# Patient Record
Sex: Male | Born: 1950 | Race: Black or African American | Hispanic: No | Marital: Married | Smoking: Never smoker
Health system: Southern US, Community
[De-identification: ages and names within clinical notes are randomized; demographics above are authoritative.]

## PROBLEM LIST (undated history)

## (undated) DIAGNOSIS — K5790 Diverticulosis of intestine, part unspecified, without perforation or abscess without bleeding: Secondary | ICD-10-CM

## (undated) DIAGNOSIS — T7840XA Allergy, unspecified, initial encounter: Secondary | ICD-10-CM

## (undated) DIAGNOSIS — I639 Cerebral infarction, unspecified: Secondary | ICD-10-CM

## (undated) DIAGNOSIS — F0390 Unspecified dementia without behavioral disturbance: Secondary | ICD-10-CM

## (undated) DIAGNOSIS — I10 Essential (primary) hypertension: Secondary | ICD-10-CM

## (undated) DIAGNOSIS — K635 Polyp of colon: Secondary | ICD-10-CM

## (undated) DIAGNOSIS — R569 Unspecified convulsions: Secondary | ICD-10-CM

## (undated) HISTORY — DX: Essential (primary) hypertension: I10

## (undated) HISTORY — DX: Unspecified convulsions: R56.9

## (undated) HISTORY — DX: Cerebral infarction, unspecified: I63.9

## (undated) HISTORY — DX: Polyp of colon: K63.5

## (undated) HISTORY — DX: Diverticulosis of intestine, part unspecified, without perforation or abscess without bleeding: K57.90

## (undated) HISTORY — DX: Allergy, unspecified, initial encounter: T78.40XA

---

## 2002-01-27 ENCOUNTER — Encounter: Payer: Self-pay | Admitting: Emergency Medicine

## 2002-01-27 ENCOUNTER — Emergency Department (HOSPITAL_COMMUNITY): Admission: EM | Admit: 2002-01-27 | Discharge: 2002-01-28 | Payer: Self-pay | Admitting: Emergency Medicine

## 2006-02-06 ENCOUNTER — Ambulatory Visit: Payer: Self-pay | Admitting: Family Medicine

## 2006-03-07 ENCOUNTER — Ambulatory Visit: Payer: Self-pay | Admitting: Family Medicine

## 2006-03-26 ENCOUNTER — Ambulatory Visit: Payer: Self-pay | Admitting: Family Medicine

## 2006-04-16 ENCOUNTER — Ambulatory Visit: Payer: Self-pay | Admitting: Gastroenterology

## 2006-04-26 ENCOUNTER — Ambulatory Visit: Payer: Self-pay | Admitting: Family Medicine

## 2006-04-29 ENCOUNTER — Ambulatory Visit: Payer: Self-pay | Admitting: Gastroenterology

## 2006-04-29 LAB — HM COLONOSCOPY

## 2006-05-29 ENCOUNTER — Ambulatory Visit: Payer: Self-pay | Admitting: Family Medicine

## 2011-05-15 ENCOUNTER — Ambulatory Visit (INDEPENDENT_AMBULATORY_CARE_PROVIDER_SITE_OTHER): Payer: BC Managed Care – PPO | Admitting: Family Medicine

## 2011-05-15 ENCOUNTER — Encounter: Payer: Self-pay | Admitting: Family Medicine

## 2011-05-15 VITALS — BP 170/120 | HR 76 | Wt 200.0 lb

## 2011-05-15 DIAGNOSIS — I10 Essential (primary) hypertension: Secondary | ICD-10-CM

## 2011-05-15 LAB — COMPREHENSIVE METABOLIC PANEL
ALT: 13 U/L (ref 0–53)
AST: 18 U/L (ref 0–37)
Albumin: 4.6 g/dL (ref 3.5–5.2)
Alkaline Phosphatase: 62 U/L (ref 39–117)
BUN: 12 mg/dL (ref 6–23)
CO2: 26 mEq/L (ref 19–32)
Calcium: 9.8 mg/dL (ref 8.4–10.5)
Chloride: 104 mEq/L (ref 96–112)
Creat: 1.37 mg/dL — ABNORMAL HIGH (ref 0.50–1.35)
Glucose, Bld: 87 mg/dL (ref 70–99)
Potassium: 4.1 mEq/L (ref 3.5–5.3)
Sodium: 139 mEq/L (ref 135–145)
Total Bilirubin: 0.8 mg/dL (ref 0.3–1.2)
Total Protein: 7.6 g/dL (ref 6.0–8.3)

## 2011-05-15 LAB — CBC WITH DIFFERENTIAL/PLATELET
Basophils Absolute: 0 10*3/uL (ref 0.0–0.1)
Basophils Relative: 0 % (ref 0–1)
Eosinophils Absolute: 0.2 10*3/uL (ref 0.0–0.7)
Eosinophils Relative: 3 % (ref 0–5)
HCT: 48.4 % (ref 39.0–52.0)
Hemoglobin: 15.7 g/dL (ref 13.0–17.0)
Lymphocytes Relative: 19 % (ref 12–46)
Lymphs Abs: 1 10*3/uL (ref 0.7–4.0)
MCH: 27.5 pg (ref 26.0–34.0)
MCHC: 32.4 g/dL (ref 30.0–36.0)
MCV: 84.9 fL (ref 78.0–100.0)
Monocytes Absolute: 0.6 10*3/uL (ref 0.1–1.0)
Monocytes Relative: 12 % (ref 3–12)
Neutro Abs: 3.5 10*3/uL (ref 1.7–7.7)
Neutrophils Relative %: 66 % (ref 43–77)
Platelets: 258 10*3/uL (ref 150–400)
RBC: 5.7 MIL/uL (ref 4.22–5.81)
RDW: 14.6 % (ref 11.5–15.5)
WBC: 5.3 10*3/uL (ref 4.0–10.5)

## 2011-05-15 LAB — LIPID PANEL
Cholesterol: 174 mg/dL (ref 0–200)
HDL: 48 mg/dL (ref >39)
LDL Cholesterol: 112 mg/dL — ABNORMAL HIGH (ref 0–99)
Total CHOL/HDL Ratio: 3.6 Ratio
Triglycerides: 71 mg/dL (ref <150)
VLDL: 14 mg/dL (ref 0–40)

## 2011-05-15 MED ORDER — LISINOPRIL-HYDROCHLOROTHIAZIDE 20-12.5 MG PO TABS: 1.0000 | ORAL_TABLET | Freq: Every day | ORAL | Status: DC

## 2011-05-15 MED ORDER — AMLODIPINE BESYLATE 5 MG PO TABS: 5.0000 mg | ORAL_TABLET | Freq: Every day | ORAL | Status: DC

## 2011-05-15 NOTE — Progress Notes (Signed)
  Subjective:    Patient ID: Craig [name], male    DOB: [birth year], 60 y.o.   MRN: [id number]  HPI He is here after a several year absence for evaluation of her blood pressure. He was last seen in August of 2007 and his blood pressure at that time was normal. Recently he has noted blood pressure readings in the 120 diastolic area. He did have one episode where his son woke him up from a deep sleep and he was apparently having some slight slurred speech however this cleared up very quickly when he woke up. He has had no  blurred vision, chest pain, shortness of breath, DOE. He does have headaches however the headaches only occur when he has a fight with his wife   Review of Systems Negative except as above    Objective:   Physical Exam alert and in no distress. Tympanic membranes and canals are normal. Throat is clear. Tonsils are normal. Neck is supple without adenopathy or thyromegaly. Cardiac exam shows a regular sinus rhythm without murmurs or gallops. Lungs are clear to auscultation. Funduscopic exam shows slight arterial narrowing but flat discs. DTRs are normal. EKG does show a strain pattern. Chest x-ray shows no acute changes.       Assessment & Plan:   1. Hypertension, accelerated  CBC with Differential, Lipid panel, Comp Met (CMET), PR ELECTROCARDIOGRAM, COMPLETE, DG Chest 2 View, POCT urinalysis dipstick   I will place him on lisinopril/HCTZ. Also amlodipine. Discussed possible side effects including cough and swelling. He is to return here in one week for recheck. Over 45 minutes spent discussing all these issues with him.

## 2011-05-16 ENCOUNTER — Telehealth: Payer: Self-pay

## 2011-05-16 NOTE — Telephone Encounter (Signed)
Informed pt of labs look good

## 2011-05-17 ENCOUNTER — Encounter: Payer: Self-pay | Admitting: Gastroenterology

## 2011-05-22 ENCOUNTER — Encounter: Payer: Self-pay | Admitting: Family Medicine

## 2011-05-22 ENCOUNTER — Ambulatory Visit (INDEPENDENT_AMBULATORY_CARE_PROVIDER_SITE_OTHER): Payer: BC Managed Care – PPO | Admitting: Family Medicine

## 2011-05-22 VITALS — BP 150/90 | HR 86 | Wt 198.0 lb

## 2011-05-22 DIAGNOSIS — I10 Essential (primary) hypertension: Secondary | ICD-10-CM

## 2011-05-22 DIAGNOSIS — Z23 Encounter for immunization: Secondary | ICD-10-CM

## 2011-05-22 NOTE — Progress Notes (Signed)
  Subjective:    Patient ID: Craig [name], male    DOB: [birth year], 60 y.o.   MRN: [id number]  HPI He is here for recheck on his blood pressure. He did have some initial difficulty with dizziness for several days she is on the medicine but none since then.   Review of Systems     Objective:   Physical Exam Alert and in no distress otherwise not examined. Blood pressure is recorded       Assessment & Plan:   1. Hypertension    continue on present medication regimen. Flu shot will also be given. Recheck here in one month.

## 2011-06-19 ENCOUNTER — Encounter: Payer: Self-pay | Admitting: Family Medicine

## 2011-06-21 ENCOUNTER — Ambulatory Visit (INDEPENDENT_AMBULATORY_CARE_PROVIDER_SITE_OTHER): Payer: BC Managed Care – PPO | Admitting: Family Medicine

## 2011-06-21 ENCOUNTER — Encounter: Payer: Self-pay | Admitting: Family Medicine

## 2011-06-21 VITALS — BP 150/100 | HR 78 | Ht 69.0 in | Wt 199.0 lb

## 2011-06-21 DIAGNOSIS — I10 Essential (primary) hypertension: Secondary | ICD-10-CM

## 2011-06-21 MED ORDER — OLMESARTAN-AMLODIPINE-HCTZ 40-10-12.5 MG PO TABS: 1.0000 | ORAL_TABLET | ORAL | Status: DC

## 2011-06-21 NOTE — Patient Instructions (Signed)
Stop to 2 pills of your presently on and switch to the new pill which will have 3 drugs in it. Recheck here in one month. If you problems getting that filled call me

## 2011-06-21 NOTE — Progress Notes (Signed)
  Subjective:    Patient ID: Craig [name], male    DOB: [birth year], 60 y.o.   MRN: [id number]  HPI He is here for recheck on his blood pressure. He has been taking his medications regularly and has no particular concerns or complaints.   Review of Systems     Objective:   Physical Exam Alert and in no distress otherwise not examined      Assessment & Plan:  Hypertension. I will try to switch him to Tribenzor. He will call and let me know if his insurance will allow this. Otherwise I will see him back here in one month.

## 2011-06-29 ENCOUNTER — Telehealth: Payer: Self-pay | Admitting: Family Medicine

## 2011-06-29 NOTE — Telephone Encounter (Signed)
PT INFORMED

## 2011-07-18 ENCOUNTER — Encounter: Payer: Self-pay | Admitting: Family Medicine

## 2011-07-18 ENCOUNTER — Ambulatory Visit (INDEPENDENT_AMBULATORY_CARE_PROVIDER_SITE_OTHER): Payer: BC Managed Care – PPO | Admitting: Family Medicine

## 2011-07-18 VITALS — BP 130/90 | HR 76 | Wt 199.0 lb

## 2011-07-18 DIAGNOSIS — R200 Anesthesia of skin: Secondary | ICD-10-CM

## 2011-07-18 DIAGNOSIS — I10 Essential (primary) hypertension: Secondary | ICD-10-CM

## 2011-07-18 DIAGNOSIS — R209 Unspecified disturbances of skin sensation: Secondary | ICD-10-CM

## 2011-07-18 NOTE — Progress Notes (Signed)
  Subjective:    Patient ID: Craig [name], male    DOB: 01/14/1951, 60 y.o.   MRN: [id number]  HPI Monday night he noticed a tingling sensation in his right hand that has continued until today. It is all of his fingers. He does have good strength . He has no weakness, numbness or tingling elsewhere. He is having no neck pain. He continues on his blood pressure medication and is here for recheck on that as well. Presently he is on Tribenzor.   Review of Systems     Objective:   Physical Exam Alert and in no distress. He has normal motor, sensory and pulses in his hands.       Assessment & Plan:   1. Hypertension   2. Numbness and tingling in right hand    he is to continue on his present blood pressure medication. I reassured him that I found nothing of concern concerning the tingling sensation. Specifically did not mention any trouble with nerve damage or stroke.

## 2011-07-23 ENCOUNTER — Ambulatory Visit: Payer: BC Managed Care – PPO | Admitting: Family Medicine

## 2011-12-06 ENCOUNTER — Inpatient Hospital Stay (HOSPITAL_COMMUNITY)
Admission: EM | Admit: 2011-12-06 | Discharge: 2011-12-10 | DRG: 014 | Disposition: A | Discharge status: Rehabilitation Facility | Payer: BC Managed Care – PPO | Attending: Internal Medicine | Admitting: Internal Medicine

## 2011-12-06 ENCOUNTER — Other Ambulatory Visit: Payer: Self-pay

## 2011-12-06 ENCOUNTER — Emergency Department (HOSPITAL_COMMUNITY): Payer: BC Managed Care – PPO

## 2011-12-06 ENCOUNTER — Encounter (HOSPITAL_COMMUNITY): Payer: Self-pay | Admitting: *Deleted

## 2011-12-06 DIAGNOSIS — G8190 Hemiplegia, unspecified affecting unspecified side: Secondary | ICD-10-CM | POA: Diagnosis present

## 2011-12-06 DIAGNOSIS — Z860100 Personal history of colon polyps, unspecified: Secondary | ICD-10-CM

## 2011-12-06 DIAGNOSIS — I639 Cerebral infarction, unspecified: Secondary | ICD-10-CM

## 2011-12-06 DIAGNOSIS — Z7982 Long term (current) use of aspirin: Secondary | ICD-10-CM

## 2011-12-06 DIAGNOSIS — R29810 Facial weakness: Secondary | ICD-10-CM | POA: Diagnosis present

## 2011-12-06 DIAGNOSIS — R4789 Other speech disturbances: Secondary | ICD-10-CM | POA: Diagnosis present

## 2011-12-06 DIAGNOSIS — Z79899 Other long term (current) drug therapy: Secondary | ICD-10-CM

## 2011-12-06 DIAGNOSIS — R7309 Other abnormal glucose: Secondary | ICD-10-CM | POA: Diagnosis present

## 2011-12-06 DIAGNOSIS — I6350 Cerebral infarction due to unspecified occlusion or stenosis of unspecified cerebral artery: Principal | ICD-10-CM | POA: Diagnosis present

## 2011-12-06 DIAGNOSIS — I672 Cerebral atherosclerosis: Secondary | ICD-10-CM | POA: Diagnosis present

## 2011-12-06 DIAGNOSIS — Z7902 Long term (current) use of antithrombotics/antiplatelets: Secondary | ICD-10-CM

## 2011-12-06 DIAGNOSIS — I10 Essential (primary) hypertension: Secondary | ICD-10-CM | POA: Diagnosis present

## 2011-12-06 DIAGNOSIS — H53469 Homonymous bilateral field defects, unspecified side: Secondary | ICD-10-CM | POA: Diagnosis present

## 2011-12-06 HISTORY — DX: Cerebral infarction, unspecified: I63.9

## 2011-12-06 LAB — CBC
Platelets: 263 10*3/uL (ref 150–400)
RDW: 13.8 % (ref 11.5–15.5)
WBC: 13.9 10*3/uL — ABNORMAL HIGH (ref 4.0–10.5)

## 2011-12-06 LAB — COMPREHENSIVE METABOLIC PANEL
ALT: 26 U/L (ref 0–53)
AST: 22 U/L (ref 0–37)
CO2: 24 mEq/L (ref 19–32)
Calcium: 9.6 mg/dL (ref 8.4–10.5)
Chloride: 100 mEq/L (ref 96–112)
GFR calc non Af Amer: 64 mL/min — ABNORMAL LOW (ref >90)
Potassium: 4.3 mEq/L (ref 3.5–5.1)
Sodium: 138 mEq/L (ref 135–145)
Total Bilirubin: 0.3 mg/dL (ref 0.3–1.2)

## 2011-12-06 LAB — CARDIAC PANEL(CRET KIN+CKTOT+MB+TROPI): CK, MB: 3.8 ng/mL (ref 0.3–4.0)

## 2011-12-06 LAB — PROTIME-INR
INR: 0.94 (ref 0.00–1.49)
Prothrombin Time: 12.8 seconds (ref 11.6–15.2)

## 2011-12-06 LAB — URINE MICROSCOPIC-ADD ON

## 2011-12-06 LAB — URINALYSIS, ROUTINE W REFLEX MICROSCOPIC
Bilirubin Urine: NEGATIVE
Specific Gravity, Urine: 1.016 (ref 1.005–1.030)
Urobilinogen, UA: 0.2 mg/dL (ref 0.0–1.0)

## 2011-12-06 LAB — DIFFERENTIAL
Basophils Absolute: 0 10*3/uL (ref 0.0–0.1)
Lymphocytes Relative: 5 % — ABNORMAL LOW (ref 12–46)
Neutro Abs: 13.1 10*3/uL — ABNORMAL HIGH (ref 1.7–7.7)

## 2011-12-06 MED ORDER — ONDANSETRON HCL 4 MG/2ML IJ SOLN
4.0000 mg | Freq: Once | INTRAMUSCULAR | Status: AC
  Administered 2011-12-06: 4 mg via INTRAVENOUS
  Filled 2011-12-06: qty 2

## 2011-12-06 MED ORDER — SODIUM CHLORIDE 0.9 % IV BOLUS (SEPSIS)
500.0000 mL | Freq: Once | INTRAVENOUS | Status: AC
  Administered 2011-12-06: 500 mL via INTRAVENOUS

## 2011-12-06 NOTE — ED Notes (Addendum)
Cancelled code stroke by Dr. Hyman Hopes at [phone]. Patient to CT and transported to [facility]. Patient has left side weakness and slight facial droop to the left side of mouth. Patient last seen normal at 0800 this morning. Patient states he awoke this and felt dizzy and went back to bed. Patient had gotten up during the day today and fell. Family found patient at 1730 this afternoon. Patient has abrasions on his left elbow and  Patient placed on monitor and sats of 100% on RA. Family at bedside.

## 2011-12-06 NOTE — ED Notes (Signed)
Patient does not look at you upon entering the room and then follows yours voice to find you.  This noticed by the nurse and family member.  Left arm moves involuntarily.

## 2011-12-06 NOTE — H&P (Signed)
Craig [name] is an 61 y.o. male.   PCP - Dr.John Susann Givens. Chief Complaint: Left-sided weakness with left facial droop and dizziness. HPI: 61 year-old male with history of hypertension was feeling dizzy today morning around 10:30 AM after which he had an episode of nausea vomiting and subsequent discharge feeling weak on the left side. He went to the bathroom and on the way back he fell to the floor. His wife to returned from work at 5:30 PM found him on the floor unconscious. He was easily arousable and was brought to the ER. CT head was negative for anything acute. Patient on exam was found to have left upper extremity and lower extremity weakness with left facial droop and unable to see the left side of the visual field in both eyes with some slurred speech. Patient is not a candidate for TPA because the exact time of onset is unclear. Neurologist on call was called and they will be seeing patient in consult and hospitalist has been requested admission. Patient denies any chest pain, shortness of breath, palpitations, headache, abdominal pain or diarrhea.  Past Medical History  Diagnosis Date  . Hypertension   . Allergy     RHINITIS  . Diverticulosis   . Colonic polyp     History reviewed. No pertinent past surgical history.  Family History  Problem Relation Age of Onset  . Cancer Mother   . Arthritis Mother   . Heart disease Mother   . Cancer Father   . Cancer Sister    Social History:  reports that he has never smoked. He has never used smokeless tobacco. He reports that he does not drink alcohol or use illicit drugs.  Allergies: No Known Allergies  Medications Prior to Admission  Medication Dose Route Frequency Provider Last Rate Last Dose  . ondansetron (ZOFRAN) injection 4 mg  4 mg Intravenous Once Loren Racer, MD   4 mg at 12/06/11 1959  . sodium chloride 0.9 % bolus 500 mL  500 mL Intravenous Once Loren Racer, MD   500 mL at 12/06/11 1958   Medications Prior to  Admission  Medication Sig Dispense Refill  . Olmesartan-Amlodipine-HCTZ 40-10-12.5 MG TABS Take 1 tablet by mouth daily.      Marland Kitchen DISCONTD: Olmesartan-Amlodipine-HCTZ (TRIBENZOR) 40-10-12.5 MG TABS Take 1 tablet by mouth 1 day or 1 dose.  30 tablet  5    Results for orders placed during the hospital encounter of 12/06/11 (from the past 48 hour(s))  CARDIAC PANEL(CRET KIN+CKTOT+MB+TROPI)     Status: Abnormal   Collection Time   12/06/11  7:33 PM      Component Value Range Comment   Total CK 449 (*) 7 - 232 (U/L)    CK, MB 3.8  0.3 - 4.0 (ng/mL)    Troponin I <0.30  <0.30 (ng/mL)    Relative Index 0.8  0.0 - 2.5    CBC     Status: Abnormal   Collection Time   12/06/11  7:34 PM      Component Value Range Comment   WBC 13.9 (*) 4.0 - 10.5 (K/uL)    RBC 5.40  4.22 - 5.81 (MIL/uL)    Hemoglobin 15.3  13.0 - 17.0 (g/dL)    HCT 16.1  09.6 - 04.5 (%)    MCV 85.0  78.0 - 100.0 (fL)    MCH 28.3  26.0 - 34.0 (pg)    MCHC 33.3  30.0 - 36.0 (g/dL)    RDW 40.9  81.1 -  15.5 (%)    Platelets 263  150 - 400 (K/uL)   DIFFERENTIAL     Status: Abnormal   Collection Time   12/06/11  7:34 PM      Component Value Range Comment   Neutrophils Relative 94 (*) 43 - 77 (%)    Neutro Abs 13.1 (*) 1.7 - 7.7 (K/uL)    Lymphocytes Relative 5 (*) 12 - 46 (%)    Lymphs Abs 0.7  0.7 - 4.0 (K/uL)    Monocytes Relative 1 (*) 3 - 12 (%)    Monocytes Absolute 0.1  0.1 - 1.0 (K/uL)    Eosinophils Relative 0  0 - 5 (%)    Eosinophils Absolute 0.0  0.0 - 0.7 (K/uL)    Basophils Relative 0  0 - 1 (%)    Basophils Absolute 0.0  0.0 - 0.1 (K/uL)   COMPREHENSIVE METABOLIC PANEL     Status: Abnormal   Collection Time   12/06/11  7:34 PM      Component Value Range Comment   Sodium 138  135 - 145 (mEq/L)    Potassium 4.3  3.5 - 5.1 (mEq/L)    Chloride 100  96 - 112 (mEq/L)    CO2 24  19 - 32 (mEq/L)    Glucose, Bld 121 (*) 70 - 99 (mg/dL)    BUN 9  6 - 23 (mg/dL)    Creatinine, Ser 4.78  0.50 - 1.35 (mg/dL)    Calcium 9.6   8.4 - 10.5 (mg/dL)    Total Protein 8.1  6.0 - 8.3 (g/dL)    Albumin 4.4  3.5 - 5.2 (g/dL)    AST 22  0 - 37 (U/L)    ALT 26  0 - 53 (U/L)    Alkaline Phosphatase 69  39 - 117 (U/L)    Total Bilirubin 0.3  0.3 - 1.2 (mg/dL)    GFR calc non Af Amer 64 (*) >90 (mL/min)    GFR calc Af Amer 74 (*) >90 (mL/min)   PROTIME-INR     Status: Normal   Collection Time   12/06/11  7:34 PM      Component Value Range Comment   Prothrombin Time 12.8  11.6 - 15.2 (seconds)    INR 0.94  0.00 - 1.49    APTT     Status: Normal   Collection Time   12/06/11  7:34 PM      Component Value Range Comment   aPTT 36  24 - 37 (seconds)   URINALYSIS, ROUTINE W REFLEX MICROSCOPIC     Status: Abnormal   Collection Time   12/06/11  8:24 PM      Component Value Range Comment   Color, Urine YELLOW  YELLOW     APPearance CLEAR  CLEAR     Specific Gravity, Urine 1.016  1.005 - 1.030     pH 6.5  5.0 - 8.0     Glucose, UA 250 (*) NEGATIVE (mg/dL)    Hgb urine dipstick TRACE (*) NEGATIVE     Bilirubin Urine NEGATIVE  NEGATIVE     Ketones, ur NEGATIVE  NEGATIVE (mg/dL)    Protein, ur NEGATIVE  NEGATIVE (mg/dL)    Urobilinogen, UA 0.2  0.0 - 1.0 (mg/dL)    Nitrite NEGATIVE  NEGATIVE     Leukocytes, UA NEGATIVE  NEGATIVE    URINE MICROSCOPIC-ADD ON     Status: Normal   Collection Time   12/06/11  8:24 PM  Component Value Range Comment   Squamous Epithelial / LPF RARE  RARE     RBC / HPF 0-2  <3 (RBC/hpf)    Ct Head Wo Contrast  12/06/2011  *RADIOLOGY REPORT*  Clinical Data: Dizziness, nausea, left-sided weakness, slurred speech  CT HEAD WITHOUT CONTRAST  Technique:  Contiguous axial images were obtained from the base of the skull through the vertex without contrast.  Comparison: None.  Findings: No evidence of parenchymal hemorrhage or extra-axial fluid collection. No mass lesion, mass effect, or midline shift.  No CT evidence of acute infarction.  Extensive subcortical white matter and periventricular small vessel  ischemic changes, including the subcortical right frontal lobe (series 2/image 18).  Old right caudate head lacunar infarct (series 2/image 15).  Intracranial atherosclerosis.  Cerebral volume is age appropriate.  No ventriculomegaly.  Small mucous retention cyst in the bilateral maxillary sinuses. The visualized paranasal sinuses are otherwise clear. The mastoid air cells are unopacified.  No evidence of calvarial fracture.  IMPRESSION: No evidence of acute intracranial abnormality.  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  Original Report Authenticated By: Charline Bills, M.D.    Review of Systems  Constitutional: Negative.   HENT: Negative.   Eyes: Negative.   Respiratory: Negative.   Cardiovascular: Negative.   Gastrointestinal: Negative.   Genitourinary: Negative.   Musculoskeletal: Negative.   Skin: Negative.   Neurological: Positive for speech change.       Left sided facial droop and weakness.  Endo/Heme/Allergies: Negative.   Psychiatric/Behavioral: Negative.     Blood pressure 160/100, pulse 111, temperature 97.8 F (36.6 C), temperature source Oral, resp. rate 14, height 5\' 10"  (1.778 m), weight 109.317 kg (241 lb), SpO2 100.00%. Physical Exam  Constitutional: He is oriented to person, place, and time. He appears well-developed and well-nourished. No distress.  HENT:  Head: Normocephalic and atraumatic.  Right Ear: External ear normal.  Left Ear: External ear normal.  Nose: Nose normal.  Mouth/Throat: Oropharynx is clear and moist. No oropharyngeal exudate.  Eyes: Conjunctivae are normal. Pupils are equal, round, and reactive to light. Right eye exhibits no discharge. Left eye exhibits no discharge. No scleral icterus.  Neck: Normal range of motion. Neck supple.  Cardiovascular: Normal rate and regular rhythm.   Respiratory: Effort normal and breath sounds normal. No respiratory distress. He has no wheezes. He has no rales.  GI:  Soft. Bowel sounds are normal. He exhibits no distension. There is no tenderness. There is no rebound.  Musculoskeletal: He exhibits no edema and no tenderness.  Neurological: He is alert and oriented to person, place, and time.       Has mild slurred speech. Tongue is midline. Cannot see on the left side of the visual field. Left upper extremity 3/5 with drift. Left lower extremity 4/5. Right upper and lower extremity 5/5.   Skin: Skin is warm and dry. He is not diaphoretic.  Psychiatric: His behavior is normal.     Assessment/Plan #1. CVA - patient will be admitted and placed on neuro checks. Patient has already passed swallow evaluation. Get MRI/MRA brain, carotid Doppler, 2-D echo. Follow neurology recommendations. Patient's monitor shows sinus rhythm. Patient will be placed on aspirin. #2. History of hypertension - continue Benicar and amlodipine but hold HCTZ. Gently hydrate. #3. Glycosuria -  Check hemoglobin A1c.  CODE STATUS - full code.  Eldridge Marcott N. 12/06/2011, 11:08 PM

## 2011-12-06 NOTE — Consult Note (Signed)
Referring Physician: Ranae Palms     Chief Complaint: Dizziness, nausea, vomiting  HPI: Craig [name] is an 61 y.o. male who reports going to bed normal at about midnight on the 3rd.  On further conversation though he reports that he had been having weakness and numbness in the left arm for about two weeks and changes in his vision for the past week.  Today awakened and was dizzy-felt as if the room was spinning.  When he was able to get to the side of the bed felt nauseous.  Attempted to go to the bathroom to vomit but fell due to imbalance.  Was unable to get up and remained on the floor until his family found him abut 1730.  Patient was brought in by EMS at that time.    LSN: Unclear tPA Given: No: Unclear LSN MRankin: 0  Past Medical History  Diagnosis Date  . Hypertension   . Allergy     RHINITIS  . Diverticulosis   . Colonic polyp     History reviewed. No pertinent past surgical history.  Family History  Problem Relation Age of Onset  . Cancer Mother   . Arthritis Mother   . Heart disease Mother   . Cancer Father   . Cancer Sister    Social History:  reports that he has never smoked. He has never used smokeless tobacco. He reports that he does not drink alcohol or use illicit drugs.  Allergies: No Known Allergies  Medications: I have reviewed the patient's current medications. Prior to Admission:  Olmesartan-Amlodipine-HCTZ  ROS: History obtained from the patient  General ROS: negative for - chills, fatigue, fever, night sweats, weight gain or weight loss Psychological ROS: negative for - behavioral disorder, hallucinations, memory difficulties, mood swings or suicidal ideation Ophthalmic ROS: as noted in HPI ENT ROS: negative for - epistaxis, nasal discharge, oral lesions, sore throat, tinnitus or vertigo Allergy and Immunology ROS: negative for - hives or itchy/watery eyes Hematological and Lymphatic ROS: negative for - bleeding problems, bruising or swollen lymph  nodes Endocrine ROS: negative for - galactorrhea, hair pattern changes, polydipsia/polyuria or temperature intolerance Respiratory ROS: negative for - cough, hemoptysis, shortness of breath or wheezing Cardiovascular ROS: negative for - chest pain, dyspnea on exertion, edema or irregular heartbeat Gastrointestinal ROS: as noted in HPI Genito-Urinary ROS: negative for - dysuria, hematuria, incontinence or urinary frequency/urgency Musculoskeletal ROS: negative for - joint swelling or muscular weakness Neurological ROS: as noted in HPI Dermatological ROS: negative for rash and skin lesion changes  Physical Examination: Blood pressure 162/100, pulse 114, temperature 97.8 F (36.6 C), temperature source Oral, resp. rate 24, height 5\' 10"  (1.778 m), weight 109.317 kg (241 lb), SpO2 97.00%.  Neurologic Examination: Mental Status: Alert, oriented, thought content appropriate.  Left neglect.  Speech fluent without evidence of aphasia.  Able to follow 3 step commands without difficulty. Cranial Nerves: II: LHH, pupils equal, round, reactive to light and accommodation III,IV, VI: ptosis not present, extra-ocular motions intact bilaterally V,VII: mild left facial droop, facial light touch sensation normal bilaterally VIII: hearing normal bilaterally IX,X: gag reflex present XI: trapezius strength/neck flexion strength normal bilaterally XII: tongue strength normal  Motor: Right : Upper extremity   5/5    Left:     Upper extremity   4/5  Lower extremity   5/5     Lower extremity   5-/5 Tone and bulk:normal tone throughout; no atrophy noted Sensory: Pinprick and light touch decreased on the left Deep Tendon  Reflexes: 2+ and symmetric with absent AJ's bilaterally Plantars: Right: downgoing   Left: downgoing Cerebellar: Difficult to perform on the left secondary to neglect    Results for orders placed during the hospital encounter of 12/06/11 (from the past 48 hour(s))  CARDIAC PANEL(CRET  KIN+CKTOT+MB+TROPI)     Status: Abnormal   Collection Time   12/06/11  7:33 PM      Component Value Range Comment   Total CK 449 (*) 7 - 232 (U/L)    CK, MB 3.8  0.3 - 4.0 (ng/mL)    Troponin I <0.30  <0.30 (ng/mL)    Relative Index 0.8  0.0 - 2.5    CBC     Status: Abnormal   Collection Time   12/06/11  7:34 PM      Component Value Range Comment   WBC 13.9 (*) 4.0 - 10.5 (K/uL)    RBC 5.40  4.22 - 5.81 (MIL/uL)    Hemoglobin 15.3  13.0 - 17.0 (g/dL)    HCT 45.4  09.8 - 11.9 (%)    MCV 85.0  78.0 - 100.0 (fL)    MCH 28.3  26.0 - 34.0 (pg)    MCHC 33.3  30.0 - 36.0 (g/dL)    RDW 14.7  82.9 - 56.2 (%)    Platelets 263  150 - 400 (K/uL)   DIFFERENTIAL     Status: Abnormal   Collection Time   12/06/11  7:34 PM      Component Value Range Comment   Neutrophils Relative 94 (*) 43 - 77 (%)    Neutro Abs 13.1 (*) 1.7 - 7.7 (K/uL)    Lymphocytes Relative 5 (*) 12 - 46 (%)    Lymphs Abs 0.7  0.7 - 4.0 (K/uL)    Monocytes Relative 1 (*) 3 - 12 (%)    Monocytes Absolute 0.1  0.1 - 1.0 (K/uL)    Eosinophils Relative 0  0 - 5 (%)    Eosinophils Absolute 0.0  0.0 - 0.7 (K/uL)    Basophils Relative 0  0 - 1 (%)    Basophils Absolute 0.0  0.0 - 0.1 (K/uL)   COMPREHENSIVE METABOLIC PANEL     Status: Abnormal   Collection Time   12/06/11  7:34 PM      Component Value Range Comment   Sodium 138  135 - 145 (mEq/L)    Potassium 4.3  3.5 - 5.1 (mEq/L)    Chloride 100  96 - 112 (mEq/L)    CO2 24  19 - 32 (mEq/L)    Glucose, Bld 121 (*) 70 - 99 (mg/dL)    BUN 9  6 - 23 (mg/dL)    Creatinine, Ser 1.30  0.50 - 1.35 (mg/dL)    Calcium 9.6  8.4 - 10.5 (mg/dL)    Total Protein 8.1  6.0 - 8.3 (g/dL)    Albumin 4.4  3.5 - 5.2 (g/dL)    AST 22  0 - 37 (U/L)    ALT 26  0 - 53 (U/L)    Alkaline Phosphatase 69  39 - 117 (U/L)    Total Bilirubin 0.3  0.3 - 1.2 (mg/dL)    GFR calc non Af Amer 64 (*) >90 (mL/min)    GFR calc Af Amer 74 (*) >90 (mL/min)   PROTIME-INR     Status: Normal   Collection Time    12/06/11  7:34 PM      Component Value Range Comment   Prothrombin Time 12.8  11.6 - 15.2 (seconds)    INR 0.94  0.00 - 1.49    APTT     Status: Normal   Collection Time   12/06/11  7:34 PM      Component Value Range Comment   aPTT 36  24 - 37 (seconds)   URINALYSIS, ROUTINE W REFLEX MICROSCOPIC     Status: Abnormal   Collection Time   12/06/11  8:24 PM      Component Value Range Comment   Color, Urine YELLOW  YELLOW     APPearance CLEAR  CLEAR     Specific Gravity, Urine 1.016  1.005 - 1.030     pH 6.5  5.0 - 8.0     Glucose, UA 250 (*) NEGATIVE (mg/dL)    Hgb urine dipstick TRACE (*) NEGATIVE     Bilirubin Urine NEGATIVE  NEGATIVE     Ketones, ur NEGATIVE  NEGATIVE (mg/dL)    Protein, ur NEGATIVE  NEGATIVE (mg/dL)    Urobilinogen, UA 0.2  0.0 - 1.0 (mg/dL)    Nitrite NEGATIVE  NEGATIVE     Leukocytes, UA NEGATIVE  NEGATIVE    URINE MICROSCOPIC-ADD ON     Status: Normal   Collection Time   12/06/11  8:24 PM      Component Value Range Comment   Squamous Epithelial / LPF RARE  RARE     RBC / HPF 0-2  <3 (RBC/hpf)    Ct Head Wo Contrast  12/06/2011  *RADIOLOGY REPORT*  Clinical Data: Dizziness, nausea, left-sided weakness, slurred speech  CT HEAD WITHOUT CONTRAST  Technique:  Contiguous axial images were obtained from the base of the skull through the vertex without contrast.  Comparison: None.  Findings: No evidence of parenchymal hemorrhage or extra-axial fluid collection. No mass lesion, mass effect, or midline shift.  No CT evidence of acute infarction.  Extensive subcortical white matter and periventricular small vessel ischemic changes, including the subcortical right frontal lobe (series 2/image 18).  Old right caudate head lacunar infarct (series 2/image 15).  Intracranial atherosclerosis.  Cerebral volume is age appropriate.  No ventriculomegaly.  Small mucous retention cyst in the bilateral maxillary sinuses. The visualized paranasal sinuses are otherwise clear. The mastoid air cells  are unopacified.  No evidence of calvarial fracture.  IMPRESSION: No evidence of acute intracranial abnormality.  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  Original Report Authenticated By: Charline Bills, M.D.    Assessment: 61 y.o. male presenting with left sided weakness/numbness, left neglect and LHH.  Right brain infarct suspected.  Onset is unclear with patient having symptoms for the past 1-2 weeks.  May have had multiple events.  CT unremarkable.  Nausea and vomiting resolved.  Stroke Risk Factors - hypertension  Plan: 1. HgbA1c, fasting lipid panel 2. MRI, MRA  of the brain without contrast 3. PT consult, OT consult, Speech consult 4. Echocardiogram 5. Carotid dopplers 6. Prophylactic therapy-Antiplatelet med: Aspirin - dose 325mg  daily 7. Risk factor modification 8. Telemetry monitoring   Thana Farr, MD [facility] [phone] 12/06/2011, 11:30 PM

## 2011-12-06 NOTE — ED Provider Notes (Signed)
History     CSN: [id number]  Arrival date & time 12/06/11  1839   First MD Initiated Contact with Patient 12/06/11 1847      Chief Complaint  Patient presents with  . Cerebrovascular Accident    (Consider location/radiation/quality/duration/timing/severity/associated sxs/prior treatment) HPI Pt states he woke this morning with sensation of room spinning, vomiting and L-sided clumsiness. Last normal last night. Pt's daughter tried to call him several time throughout day and he did not answer. When she came home at 1730, found pt in the floor, wet with vomit. Pt denied trauma but said he was to weak to move. + L-side weakness, loss of coordination and numbness. No CP, SOB, palpations, or fever Past Medical History  Diagnosis Date  . Hypertension   . Allergy     RHINITIS  . Diverticulosis   . Colonic polyp     No past surgical history on file.  Family History  Problem Relation Age of Onset  . Cancer Mother   . Arthritis Mother   . Heart disease Mother   . Cancer Father   . Cancer Sister     History  Substance Use Topics  . Smoking status: Never Smoker   . Smokeless tobacco: Never Used  . Alcohol Use: Not on file      Review of Systems  Constitutional: Negative for chills and fatigue.  HENT: Negative for facial swelling and neck pain.   Eyes: Negative for visual disturbance.  Respiratory: Negative for chest tightness and shortness of breath.   Cardiovascular: Negative for chest pain, palpitations and leg swelling.  Gastrointestinal: Positive for nausea and vomiting. Negative for abdominal pain.  Genitourinary: Negative for dysuria.  Musculoskeletal: Negative for back pain.  Skin: Negative for rash and wound.  Neurological: Positive for dizziness, weakness and numbness. Negative for light-headedness and headaches.    Allergies  Review of patient's allergies indicates no known allergies.  Home Medications   Current Outpatient Rx  Name Route Sig Dispense  Refill  . OLMESARTAN-AMLODIPINE-HCTZ 40-10-12.5 MG PO TABS Oral Take 1 tablet by mouth daily.      BP 166/93  Pulse 106  Temp(Src) 97.8 F (36.6 C) (Oral)  Resp 17  Ht 5\' 10"  (1.778 m)  Wt 241 lb (109.317 kg)  BMI 34.58 kg/m2  SpO2 99%  Physical Exam  Nursing note and vitals reviewed. Constitutional: He is oriented to person, place, and time. He appears well-developed and well-nourished. No distress.  HENT:  Head: Normocephalic and atraumatic.  Mouth/Throat: Oropharynx is clear and moist.  Eyes: EOM are normal. Pupils are equal, round, and reactive to light.  Neck: Normal range of motion. Neck supple.       No posterior cervical TTP  Cardiovascular: Normal rate and regular rhythm.   Pulmonary/Chest: Effort normal and breath sounds normal. No respiratory distress. He has no wheezes. He has no rales.  Abdominal: Soft. Bowel sounds are normal. There is no tenderness. There is no rebound and no guarding.  Musculoskeletal: Normal range of motion. He exhibits no edema and no tenderness.  Neurological: He is alert and oriented to person, place, and time.       L facial droop. +nystagmus. Facial sensation intact. Abnormal L finger-to-nose. 4/5 LUE/LLE motor. Numbness LUE/LLE. RUE/RLE neuro intact  Skin: Skin is warm and dry. No rash noted. No erythema.  Psychiatric: He has a normal mood and affect. His behavior is normal.    ED Course  Procedures (including critical care time)  Labs Reviewed  CBC -  Abnormal; Notable for the following:    WBC 13.9 (*)    All other components within normal limits  DIFFERENTIAL - Abnormal; Notable for the following:    Neutrophils Relative 94 (*)    Neutro Abs 13.1 (*)    Lymphocytes Relative 5 (*)    Monocytes Relative 1 (*)    All other components within normal limits  COMPREHENSIVE METABOLIC PANEL - Abnormal; Notable for the following:    Glucose, Bld 121 (*)    GFR calc non Af Amer 64 (*)    GFR calc Af Amer 74 (*)    All other components  within normal limits  URINALYSIS, ROUTINE W REFLEX MICROSCOPIC - Abnormal; Notable for the following:    Glucose, UA 250 (*)    Hgb urine dipstick TRACE (*)    All other components within normal limits  CARDIAC PANEL(CRET KIN+CKTOT+MB+TROPI) - Abnormal; Notable for the following:    Total CK 449 (*)    All other components within normal limits  PROTIME-INR  APTT  URINE MICROSCOPIC-ADD ON   Ct Head Wo Contrast  12/06/2011  *RADIOLOGY REPORT*  Clinical Data: Dizziness, nausea, left-sided weakness, slurred speech  CT HEAD WITHOUT CONTRAST  Technique:  Contiguous axial images were obtained from the base of the skull through the vertex without contrast.  Comparison: None.  Findings: No evidence of parenchymal hemorrhage or extra-axial fluid collection. No mass lesion, mass effect, or midline shift.  No CT evidence of acute infarction.  Extensive subcortical white matter and periventricular small vessel ischemic changes, including the subcortical right frontal lobe (series 2/image 18).  Old right caudate head lacunar infarct (series 2/image 15).  Intracranial atherosclerosis.  Cerebral volume is age appropriate.  No ventriculomegaly.  Small mucous retention cyst in the bilateral maxillary sinuses. The visualized paranasal sinuses are otherwise clear. The mastoid air cells are unopacified.  No evidence of calvarial fracture.  IMPRESSION: No evidence of acute intracranial abnormality.  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  Original Report Authenticated By: Charline Bills, M.D.     1. CVA (cerebral vascular accident)       MDM  Discussed with Dr Thad Ranger. Will see in consult. [facility] to admit       Loren Racer, MD 12/06/11 2134

## 2011-12-07 ENCOUNTER — Inpatient Hospital Stay (HOSPITAL_COMMUNITY): Payer: BC Managed Care – PPO

## 2011-12-07 DIAGNOSIS — I6330 Cerebral infarction due to thrombosis of unspecified cerebral artery: Secondary | ICD-10-CM

## 2011-12-07 LAB — CBC
HCT: 41.4 % (ref 39.0–52.0)
Platelets: 254 10*3/uL (ref 150–400)
RDW: 13.9 % (ref 11.5–15.5)
WBC: 11.2 10*3/uL — ABNORMAL HIGH (ref 4.0–10.5)

## 2011-12-07 LAB — COMPREHENSIVE METABOLIC PANEL
Alkaline Phosphatase: 52 U/L (ref 39–117)
BUN: 10 mg/dL (ref 6–23)
GFR calc Af Amer: 65 mL/min — ABNORMAL LOW (ref >90)
Glucose, Bld: 130 mg/dL — ABNORMAL HIGH (ref 70–99)
Potassium: 3.6 mEq/L (ref 3.5–5.1)
Total Protein: 6.4 g/dL (ref 6.0–8.3)

## 2011-12-07 LAB — GLUCOSE, CAPILLARY

## 2011-12-07 LAB — LIPID PANEL
Cholesterol: 139 mg/dL (ref 0–200)
HDL: 52 mg/dL (ref >39)
LDL Cholesterol: 75 mg/dL (ref 0–99)
Triglycerides: 59 mg/dL (ref <150)
VLDL: 12 mg/dL (ref 0–40)

## 2011-12-07 LAB — HEMOGLOBIN A1C
Hgb A1c MFr Bld: 6.4 % — ABNORMAL HIGH (ref <5.7)
Mean Plasma Glucose: 137 mg/dL — ABNORMAL HIGH (ref <117)

## 2011-12-07 LAB — TSH: TSH: 0.292 u[IU]/mL — ABNORMAL LOW (ref 0.350–4.500)

## 2011-12-07 MED ORDER — SODIUM CHLORIDE 0.9 % IV SOLN
INTRAVENOUS | Status: DC
  Administered 2011-12-07: via INTRAVENOUS

## 2011-12-07 MED ORDER — AMLODIPINE BESYLATE 10 MG PO TABS
10.0000 mg | ORAL_TABLET | Freq: Every day | ORAL | Status: DC
Start: 2011-12-07 — End: 2011-12-10
  Administered 2011-12-07 – 2011-12-10 (×4): 10 mg via ORAL
  Filled 2011-12-07 (×4): qty 1

## 2011-12-07 MED ORDER — IRBESARTAN 300 MG PO TABS
300.0000 mg | ORAL_TABLET | Freq: Every day | ORAL | Status: DC
  Administered 2011-12-07 – 2011-12-10 (×4): 300 mg via ORAL
  Filled 2011-12-07 (×4): qty 1

## 2011-12-07 MED ORDER — SENNOSIDES-DOCUSATE SODIUM 8.6-50 MG PO TABS: 1.0000 | ORAL_TABLET | Freq: Every evening | ORAL | Status: DC | PRN

## 2011-12-07 MED ORDER — ONDANSETRON HCL 4 MG/2ML IJ SOLN: 4.0000 mg | Freq: Four times a day (QID) | INTRAMUSCULAR | Status: DC | PRN

## 2011-12-07 MED ORDER — ASPIRIN 300 MG RE SUPP
300.0000 mg | Freq: Every day | RECTAL | Status: DC
  Filled 2011-12-07 (×3): qty 1

## 2011-12-07 MED ORDER — AMLODIPINE BESYLATE 10 MG PO TABS
10.0000 mg | ORAL_TABLET | Freq: Once | ORAL | Status: DC
  Filled 2011-12-07: qty 1

## 2011-12-07 MED ORDER — ASPIRIN 325 MG PO TABS
325.0000 mg | ORAL_TABLET | Freq: Every day | ORAL | Status: DC
  Administered 2011-12-07 – 2011-12-10 (×4): 325 mg via ORAL
  Filled 2011-12-07 (×4): qty 1

## 2011-12-07 MED ORDER — ACETAMINOPHEN 325 MG PO TABS
650.0000 mg | ORAL_TABLET | ORAL | Status: DC | PRN
  Administered 2011-12-07: 650 mg via ORAL
  Filled 2011-12-07 (×2): qty 2

## 2011-12-07 MED ORDER — STROKE: EARLY STAGES OF RECOVERY BOOK
Freq: Once | Status: AC
  Administered 2011-12-07: 07:00:00
  Filled 2011-12-07 (×2): qty 1

## 2011-12-07 MED ORDER — CLOPIDOGREL BISULFATE 75 MG PO TABS
75.0000 mg | ORAL_TABLET | Freq: Every day | ORAL | Status: DC
  Administered 2011-12-08 – 2011-12-10 (×3): 75 mg via ORAL
  Filled 2011-12-07 (×5): qty 1

## 2011-12-07 NOTE — Progress Notes (Signed)
Bilateral carotid artery duplex completed.  Preliminary report is no evidence of significant ICA stenosis. 

## 2011-12-07 NOTE — Progress Notes (Signed)
Utilization Review Completed.[name] T4/01/2012   

## 2011-12-07 NOTE — Plan of Care (Signed)
Problem: Phase II Progression Outcomes Goal: Able to communicate Outcome: Progressing Pt 's family reports pt speaking with heavy sounding tongue. Pt's speech sounds slower than average speech pattern (appears dysarthric)

## 2011-12-07 NOTE — Evaluation (Signed)
Speech Language Pathology Evaluation Patient Details Name: Craig [name] MRN: [id number] DOB: 1951-03-06 Today's Date: 12/07/2011  HPI:  61 yr old admitted with dizziness, left sided weakness.  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  Family reported speech disturbance and intermittent confusion past month.  Problem List:  Patient Active Problem List  Diagnoses  . Hypertension, accelerated  . CVA (cerebral infarction)   Past Medical History:  Past Medical History  Diagnosis Date  . Hypertension   . Allergy     RHINITIS  . Diverticulosis   . Colonic polyp    Past Surgical History: History reviewed. No pertinent past surgical history.  SLP Assessment/Plan/Recommendation Assessment Clinical Impression Statement: Pt.'s family reports mild speech disturbance and intermittent confusion (after waking up) for past month which they state pt. denies having difficulty.  He exhibits mild cognitive impairments with working memory, awareness and attention to left field of environment.  Pt. would benefit from ST on acute care and on inpatient rehab to increase independence.  SLP Recommendation/Assessment: Patient will need skilled Speech Lanaguage Pathology Services in the acute care venue to address identified deficits Problem List: Attention;Memory (awareness) Therapy Diagnosis: Cognitive Impairments Plan Speech Therapy Frequency: min 2x/week Duration: 2 weeks Treatment/Interventions: Cognitive reorganization;Oral motor exercises;SLP instruction and feedback;Compensatory strategies;Patient/family education Potential to Achieve Goals: Good SLP Recommendations Recommendations for Other Services: OT consult;Rehab consult;PT consult Follow up Recommendations:  (TBD) Individuals Consulted Consulted and Agree with Results and Recommendations: Patient;Family member/caregiver  SLP Goals  SLP Goals Potential to Achieve Goals: Good SLP Goal #1: Pt. will state 2 cognitive  deficits and impact on function given min verbal questioning cue SLP Goal #2: Pt. will recall 3 activities of previous day with min verbal cues SLP Goal #3: Pt. will utilize the left side of environment during various activities with min verbal cues.  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)  Cognition Overall Cognitive Status: Impaired Orientation Level: Oriented to person;Oriented to place;Oriented to situation;Disoriented to time Attention:  (left inattention) Memory: Impaired Memory Impairment: Decreased short term memory;Decreased recall of new information;Retrieval deficit;Storage deficit Decreased Short Term Memory: Verbal basic Awareness: Impaired Awareness Impairment: Intellectual impairment;Emergent impairment;Anticipatory impairment Problem Solving: Impaired Problem Solving Impairment: Functional complex;Verbal complex Safety/Judgment: Impaired  Comprehension Auditory Comprehension Overall Auditory Comprehension: Appears within functional limits for tasks assessed Commands: Within Functional Limits Visual Recognition/Discrimination Discrimination: Not tested  Expression Expression Primary Mode of Expression: Verbal Verbal Expression Initiation: No impairment Level of Generative/Spontaneous Verbalization: Conversation Repetition: No impairment Naming: No impairment Pragmatics: No impairment Non-Verbal Means of Communication: Not applicable Written Expression Written Expression: Not tested  Oral/Motor Oral Motor/Sensory Function Overall Oral Motor/Sensory Function: Impaired Labial ROM: Reduced left Labial Symmetry: Within Functional Limits Lingual ROM:  (decreased) Motor Speech Overall Motor Speech: Appears within functional limits for  tasks assessed Respiration: Within functional limits Phonation: Normal Resonance: Within functional limits Articulation: Within functional limitis Intelligibility: Intelligible (slightly distorted) Motor Planning: Witnin functional limits  [employer] M.Ed [employer] [phone] 12/07/2011

## 2011-12-07 NOTE — Consult Note (Signed)
Physical Medicine and Rehabilitation Consult Reason for Consult: Stroke Referring Phsyician: Dr. Eden Emms is an 61 y.o. male.   HPI: 61 year old right-handed African American male with history of hypertension admitted April 4 with left-sided weakness and facial droop with dizziness as well as episode of nausea vomiting. 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 Echocardiogram and carotid Dopplers were pending. Neurology consulted placed on aspirin plus Plavix x3 months followed by aspirin alone. Physical occupational therapy evaluations are pending. M.D. request physical medicine rehabilitation consult consider inpatient rehabilitation services  Review of Systems  Gastrointestinal: Positive for nausea and vomiting.  Neurological: Positive for dizziness.  All other systems reviewed and are negative.   Past Medical History  Diagnosis Date  . Hypertension   . Allergy     RHINITIS  . Diverticulosis   . Colonic polyp    History reviewed. No pertinent past surgical history. Family History  Problem Relation Age of Onset  . Cancer Mother   . Arthritis Mother   . Heart disease Mother   . Cancer Father   . Cancer Sister    Social History:  reports that he has never smoked. He has never used smokeless tobacco. He reports that he does not drink alcohol or use illicit drugs. Allergies: No Known Allergies Medications Prior to Admission  Medication Dose Route Frequency Provider Last Rate Last Dose  .  stroke: mapping our early stages of recovery book   Does not apply Once Clydia Llano, MD      . 0.9 %  sodium chloride infusion   Intravenous Continuous Eduard Clos, MD 100 mL/hr at 12/07/11 0015    . acetaminophen (TYLENOL) tablet 650 mg  650 mg Oral Q4H PRN Rodolph Bong, MD   650 mg at 12/07/11 0215  . amLODipine (NORVASC) tablet 10 mg  10 mg Oral Daily Eduard Clos, MD   10  mg at 12/07/11 1047  . amLODipine (NORVASC) tablet 10 mg  10 mg Oral Once Clydia Llano, MD      . aspirin suppository 300 mg  300 mg Rectal Daily Eduard Clos, MD       Or  . aspirin tablet 325 mg  325 mg Oral Daily Eduard Clos, MD   325 mg at 12/07/11 1047  . clopidogrel (PLAVIX) tablet 75 mg  75 mg Oral Q breakfast Micki Riley, MD      . irbesartan (AVAPRO) tablet 300 mg  300 mg Oral Daily Eduard Clos, MD   300 mg at 12/07/11 1047  . ondansetron (ZOFRAN) injection 4 mg  4 mg Intravenous Once Loren Racer, MD   4 mg at 12/06/11 1959  . ondansetron (ZOFRAN) injection 4 mg  4 mg Intravenous Q6H PRN Rodolph Bong, MD      . senna-docusate (Senokot-S) tablet 1 tablet  1 tablet Oral QHS PRN Eduard Clos, MD      . sodium chloride 0.9 % bolus 500 mL  500 mL Intravenous Once Loren Racer, MD   500 mL at 12/06/11 1958   Medications Prior to Admission  Medication Sig Dispense Refill  . Olmesartan-Amlodipine-HCTZ 40-10-12.5 MG TABS Take 1 tablet by mouth daily.        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) Leisure: Hobbies-yes (Comment) Comments: playing a guitar , watching tv Functional Status:  Mobility:          ADL:    Cognition: Cognition Overall Cognitive Status: Impaired Orientation Level: Oriented to person;Oriented to place;Oriented to situation;Disoriented to time Attention:  (left inattention) Memory: Impaired Memory Impairment: Decreased short term  memory;Decreased recall of new information;Retrieval deficit;Storage deficit Decreased Short Term Memory: Verbal basic Awareness: Impaired Awareness Impairment: Intellectual impairment;Emergent impairment;Anticipatory impairment Problem Solving: Impaired Problem Solving Impairment: Functional complex;Verbal complex Safety/Judgment: Impaired Cognition Orientation Level: Oriented to person;Oriented to place;Oriented to situation;Disoriented to time  Blood pressure 164/95, pulse 101, temperature 98.6 F (37 C), temperature source Oral, resp. rate 18, height 5\' 10"  (1.778 m), weight 109.317 kg (241 lb), SpO2 98.00%. Physical Exam  Nursing note and vitals reviewed. Constitutional: He is oriented to person, place, and time. He appears well-developed.  HENT:  Head: Normocephalic and atraumatic.  Eyes: Conjunctivae are normal. Pupils are equal, round, and reactive to light.  Neck: Normal range of motion. Neck supple. No thyromegaly present.  Cardiovascular: Normal rate.   Pulmonary/Chest: Effort normal and breath sounds normal. He has no wheezes.  Abdominal: He exhibits no distension. There is no tenderness.  Musculoskeletal: He exhibits no edema.  Neurological: He is alert and oriented to person, place, and time.       Left visual field cut. His gaze seemed to be slightly disconjugate but he denied any diplopia. He did have a few beats of nystagmus with right and left gaze as well as vertical/ upward gaze. Patient with mild apraxia and decreased fine motor skills. Left arm and leg are quite ataxic. He also has mild limb ataxia the right side. He is left hemisensory loss that one out of 2. He has diminished attention to the left side as well today. Cognitively he is quite appropriate and pleasant.  Skin: Skin is warm and dry.  Psychiatric: He has a normal mood and affect. His behavior is normal. Judgment and thought content normal.    Results for orders placed during the hospital encounter of  12/06/11 (from the past 24 hour(s))  CARDIAC PANEL(CRET KIN+CKTOT+MB+TROPI)     Status: Abnormal   Collection Time   12/06/11  7:33 PM      Component Value Range   Total CK 449 (*) 7 - 232 (U/L)   CK, MB 3.8  0.3 - 4.0 (ng/mL)   Troponin I <0.30  <0.30 (ng/mL)   Relative Index 0.8  0.0 - 2.5   CBC     Status: Abnormal   Collection Time   12/06/11  7:34 PM      Component Value Range   WBC 13.9 (*) 4.0 - 10.5 (K/uL)   RBC 5.40  4.22 - 5.81 (MIL/uL)   Hemoglobin 15.3  13.0 - 17.0 (g/dL)   HCT 41.3  24.4 - 01.0 (%)   MCV 85.0  78.0 - 100.0 (fL)   MCH 28.3  26.0 - 34.0 (pg)   MCHC 33.3  30.0 - 36.0 (g/dL)   RDW 27.2  53.6 - 64.4 (%)   Platelets 263  150 - 400 (K/uL)  DIFFERENTIAL     Status: Abnormal   Collection Time   12/06/11  7:34 PM      Component  Value Range   Neutrophils Relative 94 (*) 43 - 77 (%)   Neutro Abs 13.1 (*) 1.7 - 7.7 (K/uL)   Lymphocytes Relative 5 (*) 12 - 46 (%)   Lymphs Abs 0.7  0.7 - 4.0 (K/uL)   Monocytes Relative 1 (*) 3 - 12 (%)   Monocytes Absolute 0.1  0.1 - 1.0 (K/uL)   Eosinophils Relative 0  0 - 5 (%)   Eosinophils Absolute 0.0  0.0 - 0.7 (K/uL)   Basophils Relative 0  0 - 1 (%)   Basophils Absolute 0.0  0.0 - 0.1 (K/uL)  COMPREHENSIVE METABOLIC PANEL     Status: Abnormal   Collection Time   12/06/11  7:34 PM      Component Value Range   Sodium 138  135 - 145 (mEq/L)   Potassium 4.3  3.5 - 5.1 (mEq/L)   Chloride 100  96 - 112 (mEq/L)   CO2 24  19 - 32 (mEq/L)   Glucose, Bld 121 (*) 70 - 99 (mg/dL)   BUN 9  6 - 23 (mg/dL)   Creatinine, Ser 4.09  0.50 - 1.35 (mg/dL)   Calcium 9.6  8.4 - 81.1 (mg/dL)   Total Protein 8.1  6.0 - 8.3 (g/dL)   Albumin 4.4  3.5 - 5.2 (g/dL)   AST 22  0 - 37 (U/L)   ALT 26  0 - 53 (U/L)   Alkaline Phosphatase 69  39 - 117 (U/L)   Total Bilirubin 0.3  0.3 - 1.2 (mg/dL)   GFR calc non Af Amer 64 (*) >90 (mL/min)   GFR calc Af Amer 74 (*) >90 (mL/min)  PROTIME-INR     Status: Normal   Collection Time   12/06/11  7:34 PM        Component Value Range   Prothrombin Time 12.8  11.6 - 15.2 (seconds)   INR 0.94  0.00 - 1.49   APTT     Status: Normal   Collection Time   12/06/11  7:34 PM      Component Value Range   aPTT 36  24 - 37 (seconds)  URINALYSIS, ROUTINE W REFLEX MICROSCOPIC     Status: Abnormal   Collection Time   12/06/11  8:24 PM      Component Value Range   Color, Urine YELLOW  YELLOW    APPearance CLEAR  CLEAR    Specific Gravity, Urine 1.016  1.005 - 1.030    pH 6.5  5.0 - 8.0    Glucose, UA 250 (*) NEGATIVE (mg/dL)   Hgb urine dipstick TRACE (*) NEGATIVE    Bilirubin Urine NEGATIVE  NEGATIVE    Ketones, ur NEGATIVE  NEGATIVE (mg/dL)   Protein, ur NEGATIVE  NEGATIVE (mg/dL)   Urobilinogen, UA 0.2  0.0 - 1.0 (mg/dL)   Nitrite NEGATIVE  NEGATIVE    Leukocytes, UA NEGATIVE  NEGATIVE   URINE MICROSCOPIC-ADD ON     Status: Normal   Collection Time   12/06/11  8:24 PM      Component Value Range   Squamous Epithelial / LPF RARE  RARE    RBC / HPF 0-2  <3 (RBC/hpf)  COMPREHENSIVE METABOLIC PANEL     Status: Abnormal   Collection Time   12/07/11  4:40 AM      Component Value Range   Sodium 140  135 - 145 (mEq/L)   Potassium 3.6  3.5 - 5.1 (mEq/L)   Chloride 106  96 - 112 (mEq/L)   CO2  25  19 - 32 (mEq/L)   Glucose, Bld 130 (*) 70 - 99 (mg/dL)   BUN 10  6 - 23 (mg/dL)   Creatinine, Ser 1.61  0.50 - 1.35 (mg/dL)   Calcium 8.7  8.4 - 09.6 (mg/dL)   Total Protein 6.4  6.0 - 8.3 (g/dL)   Albumin 3.4 (*) 3.5 - 5.2 (g/dL)   AST 20  0 - 37 (U/L)   ALT 19  0 - 53 (U/L)   Alkaline Phosphatase 52  39 - 117 (U/L)   Total Bilirubin 0.4  0.3 - 1.2 (mg/dL)   GFR calc non Af Amer 56 (*) >90 (mL/min)   GFR calc Af Amer 65 (*) >90 (mL/min)  CBC     Status: Abnormal   Collection Time   12/07/11  4:40 AM      Component Value Range   WBC 11.2 (*) 4.0 - 10.5 (K/uL)   RBC 4.83  4.22 - 5.81 (MIL/uL)   Hemoglobin 13.4  13.0 - 17.0 (g/dL)   HCT 04.5  40.9 - 81.1 (%)   MCV 85.7  78.0 - 100.0 (fL)   MCH 27.7   26.0 - 34.0 (pg)   MCHC 32.4  30.0 - 36.0 (g/dL)   RDW 91.4  78.2 - 95.6 (%)   Platelets 254  150 - 400 (K/uL)  TSH     Status: Abnormal   Collection Time   12/07/11  4:40 AM      Component Value Range   TSH 0.292 (*) 0.350 - 4.500 (uIU/mL)  LIPID PANEL     Status: Normal   Collection Time   12/07/11  4:40 AM      Component Value Range   Cholesterol 139  0 - 200 (mg/dL)   Triglycerides 59  <213 (mg/dL)   HDL 52  >08 (mg/dL)   Total CHOL/HDL Ratio 2.7     VLDL 12  0 - 40 (mg/dL)   LDL Cholesterol 75  0 - 99 (mg/dL)   Dg Chest 2 View  02/05/7845  *RADIOLOGY REPORT*  Clinical Data: Weakness in the left arm.  Stroke.  CHEST - 2 VIEW  Comparison: None.  Findings: Shallow inspiration with elevation of right hemidiaphragm.  Borderline heart size, likely normal for technique. Pulmonary vascularity is not increased.  No blunting of costophrenic angles.  No pneumothorax.  No focal airspace consolidation.  IMPRESSION: No evidence of active pulmonary disease.  Original Report Authenticated By: Marlon Pel, M.D.   Ct Head Wo Contrast  12/06/2011  *RADIOLOGY REPORT*  Clinical Data: Dizziness, nausea, left-sided weakness, slurred speech  CT HEAD WITHOUT CONTRAST  Technique:  Contiguous axial images were obtained from the base of the skull through the vertex without contrast.  Comparison: None.  Findings: No evidence of parenchymal hemorrhage or extra-axial fluid collection. No mass lesion, mass effect, or midline shift.  No CT evidence of acute infarction.  Extensive subcortical white matter and periventricular small vessel ischemic changes, including the subcortical right frontal lobe (series 2/image 18).  Old right caudate head lacunar infarct (series 2/image 15).  Intracranial atherosclerosis.  Cerebral volume is age appropriate.  No ventriculomegaly.  Small mucous retention cyst in the bilateral maxillary sinuses. The visualized paranasal sinuses are otherwise clear. The mastoid air cells are  unopacified.  No evidence of calvarial fracture.  IMPRESSION: No evidence of acute intracranial abnormality.  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  Original Report Authenticated By: Charline Bills, M.D.   Mr  Brain Wo Contrast  12/07/2011  *RADIOLOGY REPORT*  Clinical Data:  Stroke.  Left-sided weakness  MRI HEAD WITHOUT CONTRAST MRA HEAD WITHOUT CONTRAST  Technique:  Multiplanar, multiecho pulse sequences of the brain and surrounding structures were obtained without intravenous contrast. Angiographic images of the head were obtained using MRA technique without contrast.  Comparison:  CT 12/06/2011  MRI HEAD  Findings:  Acute right posterior cerebral artery infarct. Restricted diffusion in the right thalamus as well as the posterior medial temporal lobe and right occipital lobe.  Acute infarct left superior cerebellar territory.  Extensive chronic ischemic changes throughout the cerebral white matter bilaterally.  Chronic ischemia in the basal ganglia and thalami and pons bilaterally.  Chronic infarcts in the cerebellum. Multiple areas of chronic micro hemorrhage in the brain which may be related to hypertension or cerebral amyloid.  No mass lesion.  Chronic sinusitis with mucosal thickening in the paranasal sinuses.  Ventricles are not enlarged.  Age appropriate atrophy.  IMPRESSION: Acute right posterior cerebral artery infarct.  Acute left superior cerebellar infarct.  Extensive chronic ischemic changes.  Scattered areas of chronic micro hemorrhage.  These findings suggest  chronic severe hypertension.  MRA HEAD  Findings: Right vertebral artery is patent to the basilar. PICA is not visualized bilaterally.  Right AICA is visualized.  Left vertebral artery is small and has minimal flow and may be diffusely diseased or have low flow.  Basilar is patent.  Occlusion of the right posterior cerebral artery at the origin.  Right superior cerebral artery is diseased but  patent.  Left superior cerebellar artery is occluded.  Left posterior cerebral artery is patent.  Internal carotid artery is patent bilaterally without significant stenosis.  Anterior and middle cerebral arteries are patent bilaterally.  Atherosclerotic irregularity is present in the middle cerebral artery branches bilaterally with a moderately severe stenosis of the anterior division of the left middle cerebral artery. Moderate stenosis of the left A2 segment.  Scattered disease in the right middle cerebral artery.  Negative for cerebral aneurysm.  IMPRESSION: Diffuse intracranial atherosclerotic disease.  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  Occlusion of the left superior cerebellar  artery compatible with areas of acute infarction.  Original Report Authenticated By: Camelia Phenes, M.D.   Mr Mra Head/brain Wo Cm  12/07/2011  *RADIOLOGY REPORT*  Clinical Data:  Stroke.  Left-sided weakness  MRI HEAD WITHOUT CONTRAST MRA HEAD WITHOUT CONTRAST  Technique:  Multiplanar, multiecho pulse sequences of the brain and surrounding structures were obtained without intravenous contrast. Angiographic images of the head were obtained using MRA technique without contrast.  Comparison:  CT 12/06/2011  MRI HEAD  Findings:  Acute right posterior cerebral artery infarct. Restricted diffusion in the right thalamus as well as the posterior medial temporal lobe and right occipital lobe.  Acute infarct left superior cerebellar territory.  Extensive chronic ischemic changes throughout the cerebral white matter bilaterally.  Chronic ischemia in the basal ganglia and thalami and pons bilaterally.  Chronic infarcts in the cerebellum. Multiple areas of chronic micro hemorrhage in the brain which may be related to hypertension or cerebral amyloid.  No mass lesion.  Chronic sinusitis with mucosal thickening in the paranasal sinuses.  Ventricles are not enlarged.  Age appropriate atrophy.  IMPRESSION: Acute right posterior  cerebral artery infarct.  Acute left superior cerebellar infarct.  Extensive chronic ischemic changes.  Scattered areas of chronic micro hemorrhage.  These findings suggest  chronic severe hypertension.  MRA HEAD  Findings: Right vertebral  artery is patent to the basilar. PICA is not visualized bilaterally.  Right AICA is visualized.  Left vertebral artery is small and has minimal flow and may be diffusely diseased or have low flow.  Basilar is patent.  Occlusion of the right posterior cerebral artery at the origin.  Right superior cerebral artery is diseased but patent.  Left superior cerebellar artery is occluded.  Left posterior cerebral artery is patent.  Internal carotid artery is patent bilaterally without significant stenosis.  Anterior and middle cerebral arteries are patent bilaterally.  Atherosclerotic irregularity is present in the middle cerebral artery branches bilaterally with a moderately severe stenosis of the anterior division of the left middle cerebral artery. Moderate stenosis of the left A2 segment.  Scattered disease in the right middle cerebral artery.  Negative for cerebral aneurysm.  IMPRESSION: Diffuse intracranial atherosclerotic disease.  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  Occlusion of the left superior cerebellar  artery compatible with areas of acute infarction.  Original Report Authenticated By: Camelia Phenes, M.D.    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 Does the need for close, 24 hr/day medical supervision in concert with the patient's rehab needs make it unreasonable for this patient to be served in a less intensive setting? Yes 2. Co-Morbidities requiring supervision/potential complications: Hypertension 3. Due to bladder management, bowel management, safety, skin/wound care, disease management, medication administration, pain management and patient education, does the patient require 24 hr/day  rehab nursing? Yes 4. Does the patient require coordinated care of a physician, rehab nurse, PT (1-2 hrs/day, 5 days/week) and OT (1-2 hrs/day, 5 days/week) to address physical and functional deficits in the context of the above medical diagnosis(es)? Yes Addressing deficits in the following areas: balance, endurance, locomotion, strength, transferring, bowel/bladder control, bathing, dressing, feeding, grooming and toileting 5. Can the patient actively participate in an intensive therapy program of at least 3 hrs of therapy per day at least 5 days per week? Yes 6. The potential for patient to make measurable gains while on inpatient rehab is excellent 7. Anticipated functional outcomes upon discharge from inpatients are modified independent to supervision PT, modified independent to minimal assistance OT,  8. Estimated rehab length of stay to reach the above functional goals is: 2-3 weeks 9. Does the patient have adequate social supports to accommodate these discharge functional goals? Yes 10. Anticipated D/C setting: Home 11. Anticipated post D/C treatments: Outpt therapy 12. Overall Rehab/Functional Prognosis: excellent  RECOMMENDATIONS: This patient's condition is appropriate for continued rehabilitative care in the following setting: CIR Patient has agreed to participate in recommended program. Yes Note that insurance prior authorization may be required for reimbursement for recommended care.  Comment: Rehabilitation nurse to followup.   Ranelle Oyster 12/07/2011

## 2011-12-07 NOTE — Evaluation (Addendum)
Occupational Therapy Evaluation Patient Details Name: Craig [name] MRN: [id number] DOB: [birth year] Today's Date: 12/07/2011  Problem List:  Patient Active Problem List  Diagnoses  . Hypertension, accelerated  . CVA (cerebral infarction)    Past Medical History:  Past Medical History  Diagnosis Date  . Hypertension   . Allergy     RHINITIS  . Diverticulosis   . Colonic polyp    Past Surgical History: History reviewed. No pertinent past surgical history.  OT Assessment/Plan/Recommendation OT Assessment Clinical Impression Statement: 61 yo male s/p BIl side CVA with NIH 6 with Lt side visual deficits. Recommend CIR For d/c planning. OT to follow acutely OT Recommendation/Assessment: Patient will need skilled OT in the acute care venue OT Problem List: Decreased strength;Decreased activity tolerance;Impaired balance (sitting and/or standing);Impaired vision/perception;Decreased coordination;Decreased cognition;Decreased safety awareness;Decreased knowledge of use of DME or AE;Decreased knowledge of precautions;Impaired UE functional use OT Therapy Diagnosis : Generalized weakness;Disturbance of vision;Ataxia OT Plan OT Frequency: Min 3X/week OT Treatment/Interventions: Self-care/ADL training;Therapeutic exercise;Neuromuscular education;DME and/or AE instruction;Therapeutic activities;Cognitive remediation/compensation;Visual/perceptual remediation/compensation;Patient/family education;Balance training OT Recommendation Recommendations for Other Services: Rehab consult Follow Up Recommendations: Inpatient Rehab Equipment Recommended: Defer to next venue Individuals Consulted Consulted and Agree with Results and Recommendations: Patient;Family member/caregiver OT Goals Acute Rehab OT Goals OT Goal Formulation: With patient/family Time For Goal Achievement: 2 weeks ADL Goals Pt Will Transfer to Toilet: with mod assist;3-in-1 ADL Goal: Toilet Transfer - Progress: Goal set today Pt  Will Perform Toileting - Clothing Manipulation: with mod assist;Sitting on 3-in-1 or toilet ADL Goal: Toileting - Clothing Manipulation - Progress: Goal set today Pt Will Perform Toileting - Hygiene: with mod assist;Sit to stand from 3-in-1/toilet ADL Goal: Toileting - Hygiene - Progress: Goal set today Miscellaneous OT Goals Miscellaneous OT Goal #1: Pt will demonstrate bed mobility min guard with min v/c for sequencing  OT Goal: Miscellaneous Goal #1 - Progress: Goal set today Miscellaneous OT Goal #2: Pt will sit EOB for 5 minutes without LOB and self correcting posterior using mirror or environmental cues as guide as precursor to basic transfer OT Goal: Miscellaneous Goal #2 - Progress: Goal set today  OT Evaluation Precautions/Restrictions  Precautions Precautions: Fall Precaution Comments: significant visual deficit to L with L side inattension Restrictions Weight Bearing Restrictions: No Prior Functioning Home Living Lives With: Spouse;Son Type of Home: House Home Layout: One level Home Access:  (one small threshold) Bathroom Shower/Tub: Tub/shower unit;Curtain Firefighter: Standard Home Adaptive Equipment: None Additional Comments: wife able to use FMLA and son graduates from college in 4 weeks Prior Function Level of Independence: Independent with basic ADLs;Independent with gait;Independent with transfers Able to Take Stairs?: Yes Driving: Yes Vocation: Full time employment Leisure: Hobbies-yes (Comment) Comments:   playing a guitar , watching tv  ADL ADL Eating/Feeding: Performed;Other (comment) (Min Guard (A)) Where Assessed - Eating/Feeding: Edge of bed (pt educated compensatory strategy for lunch) Upper Body Dressing: Simulated;Moderate assistance Where Assessed - Upper Body Dressing: Sitting, bed;Unsupported Lower Body Dressing: Simulated;+1 Total assistance Where Assessed - Lower Body Dressing: Sitting, bed;Unsupported Toilet Transfer: Simulated;+2 Total  assistance;Comment for patient % (pt 60%) Ambulation Related to ADLs: A few short steps taken at bedside no ambulation in room at this time. ADL Comments: Pt demonstrates motor planning deficits on Lt UE / LE and proprioception deficits. Pt demonstrates decreased sensation in Lt UE / LE. Pt provided weight bearing over Lt Ue and LE then demonstrating increased motor control during session.  Vision/Perception  Vision - History Baseline Vision: No visual  deficits Patient Visual Report: Other (comment) (Lt visual deficits) Cognition Cognition Arousal/Alertness: Awake/alert Overall Cognitive Status: Appears within functional limits for tasks assessed Orientation Level: Oriented to person;Oriented to place;Oriented to situation Sensation/Coordination Sensation Light Touch: Impaired by gross assessment Proprioception: Impaired by gross assessment Coordination Gross Motor Movements are Fluid and Coordinated: No Fine Motor Movements are Fluid and Coordinated: No Coordination and Movement Description:   decreased coordination L upper and Lower extremities including dysmetria and general inattention to the position/function on the left side. Noted truncal incoordination also.  Finger Nose Finger Test: dysmetria (able to perform Rt side, deficits on Lt side) Heel Shin Test: dysmetria and truncal coordination failure Extremity Assessment RUE Assessment RUE Assessment: Within Functional Limits LUE Assessment LUE Assessment: Not tested Mobility  Bed Mobility Bed Mobility: Yes Rolling Right: 4: Min assist Rolling Right Details (indicate cue type and reason):   vc's to find and bring his L UE right; assist left LE off the bed. pt unaware that the leg was stuck at EOB  Right Sidelying to Sit: 4: Min assist Right Sidelying to Sit Details (indicate cue type and reason): vc's to activate BIL LE  Sitting - Scoot to Edge of Bed: 3: Mod assist Sitting - Scoot to Edge of Bed Details (indicate cue type and  reason): vc's for w/shift along with w/shift assist and assymmetrical scooting assist. Sit to Sidelying Right: 1: +2 Total assist;Patient percentage (comment);HOB flat;Other (comment) Transfers Transfers: Yes Sit to Stand: 1: +2 Total assist;Patient percentage (comment);With upper extremity assist;From bed;Other (comment) (pt 60%) Sit to Stand Details (indicate cue type and reason): v/c for hand placement sequencing and (A) for core stability and forward translation for upright posture Stand to Sit: 1: +2 Total assist;Patient percentage (comment);To bed    End of Session OT - End of Session Equipment Utilized During Treatment: Gait belt Activity Tolerance: Patient tolerated treatment well Patient left: in bed;with call bell in reach Nurse Communication: Mobility status for transfers;Mobility status for ambulation General Behavior During Session: [facility] for tasks performed Cognition: [facility] for tasks performed  Educated on signs and symptoms of stroke   Lucile Shutters 12/07/2011, 1:36 PM  Pager: [phone]

## 2011-12-07 NOTE — Progress Notes (Signed)
Subjective:  Craig [name] is an 61 y.o. male who reports going to bed normal at about midnight on the 3rd. On further conversation though he reports that he had been having weakness and numbness in the left arm for about two weeks and changes in his vision for the past week. Today awakened and was dizzy-felt as if the room was spinning. When he was able to get to the side of the bed felt nauseous. Attempted to go to the bathroom to vomit but fell due to imbalance. Was unable to get up and remained on the floor until his family found him abut 1730. Patient was brought in by EMS at that time.  LSN: Unclear  tPA Given: No: Unclear LSN  MRankin: 0 This am he feels left sided weakness is better. Nausea and dizziness persists.he states last year he had a dizzy spell for 2-3 days and was of fbalance but did not seek medical help at that time.    Objective: Vital signs in last 24 hours: Temp:  [97.8 F (36.6 C)-100.4 F (38 C)] 98.6 F (37 C) (04/05 1025) Pulse Rate:  [101-118] 101  (04/05 1025) Resp:  [14-24] 18  (04/05 1025) BP: (134-167)/(75-101) 164/95 mmHg (04/05 1025) SpO2:  [95 %-100 %] 98 % (04/05 1025) Weight:  [109.317 kg (241 lb)] 109.317 kg (241 lb) (04/04 2100) Weight change:  Last BM Date: 12/05/11  Intake/Output from previous day:   Intake/Output this shift: Total I/O In: -  Out: 250 [Urine:250]  Physical exam :Awake alert. Afebrile. Head is nontraumatic. Neck is supple without bruit. Hearing is normal. Cardiac exam no murmur or gallop. Lungs are clear to auscultation. Distal pulses are well felt.   Neurological Exam  Awake alert oriented x 3 normal speech and language. Mild left lower face asymmetry.Extra ocular movements full range but mild right gaze preference. Bilateral end gaze horizontal nystagmus. Dense left homonymous hemianoipa. Tongue midline. No drift.Mild LUE drift with grip and hand weakness. Mild diminished fine finger movements on left. Orbits right over left  upper extremity. Mild left grip weak..Diminished left hemibody touch/pinprick sensation . Normal coordination. Gait deferred.  Lab Results:  Basename 12/07/11 0440 12/06/11 1934  WBC 11.2* 13.9*  HGB 13.4 15.3  HCT 41.4 45.9  PLT 254 263   BMET  Basename 12/07/11 0440 12/06/11 1934  NA 140 138  K 3.6 4.3  CL 106 100  CO2 25 24  GLUCOSE 130* 121*  BUN 10 9  CREATININE 1.35 1.21  CALCIUM 8.7 9.6    Studies/Results: Dg Chest 2 View  12/07/2011  *RADIOLOGY REPORT*  Clinical Data: Weakness in the left arm.  Stroke.  CHEST - 2 VIEW  Comparison: None.  Findings: Shallow inspiration with elevation of right hemidiaphragm.  Borderline heart size, likely normal for technique. Pulmonary vascularity is not increased.  No blunting of costophrenic angles.  No pneumothorax.  No focal airspace consolidation.  IMPRESSION: No evidence of active pulmonary disease.  Original Report Authenticated By: Marlon Pel, M.D.   Ct Head Wo Contrast  12/06/2011  *RADIOLOGY REPORT*  Clinical Data: Dizziness, nausea, left-sided weakness, slurred speech  CT HEAD WITHOUT CONTRAST  Technique:  Contiguous axial images were obtained from the base of the skull through the vertex without contrast.  Comparison: None.  Findings: No evidence of parenchymal hemorrhage or extra-axial fluid collection. No mass lesion, mass effect, or midline shift.  No CT evidence of acute infarction.  Extensive subcortical white matter and periventricular small vessel ischemic changes, including  the subcortical right frontal lobe (series 2/image 18).  Old right caudate head lacunar infarct (series 2/image 15).  Intracranial atherosclerosis.  Cerebral volume is age appropriate.  No ventriculomegaly.  Small mucous retention cyst in the bilateral maxillary sinuses. The visualized paranasal sinuses are otherwise clear. The mastoid air cells are unopacified.  No evidence of calvarial fracture.  IMPRESSION: No evidence of acute intracranial  abnormality.  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  Original Report Authenticated By: Charline Bills, M.D.   Mr Brain Wo Contrast  12/07/2011  *RADIOLOGY REPORT*  Clinical Data:  Stroke.  Left-sided weakness  MRI HEAD WITHOUT CONTRAST MRA HEAD WITHOUT CONTRAST  Technique:  Multiplanar, multiecho pulse sequences of the brain and surrounding structures were obtained without intravenous contrast. Angiographic images of the head were obtained using MRA technique without contrast.  Comparison:  CT 12/06/2011  MRI HEAD  Findings:  Acute right posterior cerebral artery infarct. Restricted diffusion in the right thalamus as well as the posterior medial temporal lobe and right occipital lobe.  Acute infarct left superior cerebellar territory.  Extensive chronic ischemic changes throughout the cerebral white matter bilaterally.  Chronic ischemia in the basal ganglia and thalami and pons bilaterally.  Chronic infarcts in the cerebellum. Multiple areas of chronic micro hemorrhage in the brain which may be related to hypertension or cerebral amyloid.  No mass lesion.  Chronic sinusitis with mucosal thickening in the paranasal sinuses.  Ventricles are not enlarged.  Age appropriate atrophy.  IMPRESSION: Acute right posterior cerebral artery infarct.  Acute left superior cerebellar infarct.  Extensive chronic ischemic changes.  Scattered areas of chronic micro hemorrhage.  These findings suggest  chronic severe hypertension.  MRA HEAD  Findings: Right vertebral artery is patent to the basilar. PICA is not visualized bilaterally.  Right AICA is visualized.  Left vertebral artery is small and has minimal flow and may be diffusely diseased or have low flow.  Basilar is patent.  Occlusion of the right posterior cerebral artery at the origin.  Right superior cerebral artery is diseased but patent.  Left superior cerebellar artery is occluded.  Left posterior cerebral artery is patent.   Internal carotid artery is patent bilaterally without significant stenosis.  Anterior and middle cerebral arteries are patent bilaterally.  Atherosclerotic irregularity is present in the middle cerebral artery branches bilaterally with a moderately severe stenosis of the anterior division of the left middle cerebral artery. Moderate stenosis of the left A2 segment.  Scattered disease in the right middle cerebral artery.  Negative for cerebral aneurysm.  IMPRESSION: Diffuse intracranial atherosclerotic disease.  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  Occlusion of the left superior cerebellar  artery compatible with areas of acute infarction.  Original Report Authenticated By: Camelia Phenes, M.D.   Mr Mra Head/brain Wo Cm  12/07/2011  *RADIOLOGY REPORT*  Clinical Data:  Stroke.  Left-sided weakness  MRI HEAD WITHOUT CONTRAST MRA HEAD WITHOUT CONTRAST  Technique:  Multiplanar, multiecho pulse sequences of the brain and surrounding structures were obtained without intravenous contrast. Angiographic images of the head were obtained using MRA technique without contrast.  Comparison:  CT 12/06/2011  MRI HEAD  Findings:  Acute right posterior cerebral artery infarct. Restricted diffusion in the right thalamus as well as the posterior medial temporal lobe and right occipital lobe.  Acute infarct left superior cerebellar territory.  Extensive chronic ischemic changes throughout the cerebral white matter bilaterally.  Chronic ischemia in the basal ganglia and thalami and  pons bilaterally.  Chronic infarcts in the cerebellum. Multiple areas of chronic micro hemorrhage in the brain which may be related to hypertension or cerebral amyloid.  No mass lesion.  Chronic sinusitis with mucosal thickening in the paranasal sinuses.  Ventricles are not enlarged.  Age appropriate atrophy.  IMPRESSION: Acute right posterior cerebral artery infarct.  Acute left superior cerebellar infarct.  Extensive chronic ischemic changes.   Scattered areas of chronic micro hemorrhage.  These findings suggest  chronic severe hypertension.  MRA HEAD  Findings: Right vertebral artery is patent to the basilar. PICA is not visualized bilaterally.  Right AICA is visualized.  Left vertebral artery is small and has minimal flow and may be diffusely diseased or have low flow.  Basilar is patent.  Occlusion of the right posterior cerebral artery at the origin.  Right superior cerebral artery is diseased but patent.  Left superior cerebellar artery is occluded.  Left posterior cerebral artery is patent.  Internal carotid artery is patent bilaterally without significant stenosis.  Anterior and middle cerebral arteries are patent bilaterally.  Atherosclerotic irregularity is present in the middle cerebral artery branches bilaterally with a moderately severe stenosis of the anterior division of the left middle cerebral artery. Moderate stenosis of the left A2 segment.  Scattered disease in the right middle cerebral artery.  Negative for cerebral aneurysm.  IMPRESSION: Diffuse intracranial atherosclerotic disease.  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  Occlusion of the left superior cerebellar  artery compatible with areas of acute infarction.  Original Report Authenticated By: Camelia Phenes, M.D.         Medications: I have reviewed the patient's current medications.  Assessment/Plan: 61 year male with right posterior cerebral artery and left superior cerebellar artery infarcts likely from occlusive intracranial atherosclerotic disease Plan : Aspirin 81 mg plus Plavix  X 3 month followed by aspirin alone. Check dopplers, ECHO, Lipids, HbA1c. PT/OT/REHAB consults. D/W wife and son and answered questions.  LOS: 1 day   Admiral Marcucci,PRAMODKUMAR P 12/07/2011, 11:34 AM

## 2011-12-07 NOTE — Progress Notes (Signed)
Subjective: Patient seen and examined, feeling a little better but still feeling nauseated and dizzy.  Objective: Vital signs in last 24 hours: Temp:  [97.8 F (36.6 C)-100.4 F (38 C)] 98.6 F (37 C) (04/05 1025) Pulse Rate:  [101-118] 101  (04/05 1025) Resp:  [14-24] 18  (04/05 1025) BP: (134-167)/(75-101) 164/95 mmHg (04/05 1025) SpO2:  [95 %-100 %] 98 % (04/05 1025) Weight:  [109.317 kg (241 lb)] 109.317 kg (241 lb) (04/04 2100) Weight change:  Last BM Date: 12/05/11  Intake/Output from previous day:   Total I/O In: -  Out: 250 [Urine:250]   Physical Exam: General: Alert, awake, oriented x3, in no acute distress. HEENT: No bruits, no goiter. Heart: Regular rate and rhythm, without murmurs, rubs, gallops. Lungs: Clear to auscultation bilaterally. Abdomen: Soft, nontender, nondistended, positive bowel sounds. Extremities: No clubbing cyanosis or edema with positive pedal pulses. Neuro: Left upper extremity paresis 4/5, otherwise motor power is 5/5 in other extremities. Diminished left side the sensation to light touch. Left facial flattening    Lab Results: Results for orders placed during the hospital encounter of 12/06/11 (from the past 24 hour(s))  CARDIAC PANEL(CRET KIN+CKTOT+MB+TROPI)     Status: Abnormal   Collection Time   12/06/11  7:33 PM      Component Value Range   Total CK 449 (*) 7 - 232 (U/L)   CK, MB 3.8  0.3 - 4.0 (ng/mL)   Troponin I <0.30  <0.30 (ng/mL)   Relative Index 0.8  0.0 - 2.5   CBC     Status: Abnormal   Collection Time   12/06/11  7:34 PM      Component Value Range   WBC 13.9 (*) 4.0 - 10.5 (K/uL)   RBC 5.40  4.22 - 5.81 (MIL/uL)   Hemoglobin 15.3  13.0 - 17.0 (g/dL)   HCT 45.4  09.8 - 11.9 (%)   MCV 85.0  78.0 - 100.0 (fL)   MCH 28.3  26.0 - 34.0 (pg)   MCHC 33.3  30.0 - 36.0 (g/dL)   RDW 14.7  82.9 - 56.2 (%)   Platelets 263  150 - 400 (K/uL)  DIFFERENTIAL     Status: Abnormal   Collection Time   12/06/11  7:34 PM      Component  Value Range   Neutrophils Relative 94 (*) 43 - 77 (%)   Neutro Abs 13.1 (*) 1.7 - 7.7 (K/uL)   Lymphocytes Relative 5 (*) 12 - 46 (%)   Lymphs Abs 0.7  0.7 - 4.0 (K/uL)   Monocytes Relative 1 (*) 3 - 12 (%)   Monocytes Absolute 0.1  0.1 - 1.0 (K/uL)   Eosinophils Relative 0  0 - 5 (%)   Eosinophils Absolute 0.0  0.0 - 0.7 (K/uL)   Basophils Relative 0  0 - 1 (%)   Basophils Absolute 0.0  0.0 - 0.1 (K/uL)  COMPREHENSIVE METABOLIC PANEL     Status: Abnormal   Collection Time   12/06/11  7:34 PM      Component Value Range   Sodium 138  135 - 145 (mEq/L)   Potassium 4.3  3.5 - 5.1 (mEq/L)   Chloride 100  96 - 112 (mEq/L)   CO2 24  19 - 32 (mEq/L)   Glucose, Bld 121 (*) 70 - 99 (mg/dL)   BUN 9  6 - 23 (mg/dL)   Creatinine, Ser 1.30  0.50 - 1.35 (mg/dL)   Calcium 9.6  8.4 - 86.5 (mg/dL)  Total Protein 8.1  6.0 - 8.3 (g/dL)   Albumin 4.4  3.5 - 5.2 (g/dL)   AST 22  0 - 37 (U/L)   ALT 26  0 - 53 (U/L)   Alkaline Phosphatase 69  39 - 117 (U/L)   Total Bilirubin 0.3  0.3 - 1.2 (mg/dL)   GFR calc non Af Amer 64 (*) >90 (mL/min)   GFR calc Af Amer 74 (*) >90 (mL/min)  PROTIME-INR     Status: Normal   Collection Time   12/06/11  7:34 PM      Component Value Range   Prothrombin Time 12.8  11.6 - 15.2 (seconds)   INR 0.94  0.00 - 1.49   APTT     Status: Normal   Collection Time   12/06/11  7:34 PM      Component Value Range   aPTT 36  24 - 37 (seconds)  URINALYSIS, ROUTINE W REFLEX MICROSCOPIC     Status: Abnormal   Collection Time   12/06/11  8:24 PM      Component Value Range   Color, Urine YELLOW  YELLOW    APPearance CLEAR  CLEAR    Specific Gravity, Urine 1.016  1.005 - 1.030    pH 6.5  5.0 - 8.0    Glucose, UA 250 (*) NEGATIVE (mg/dL)   Hgb urine dipstick TRACE (*) NEGATIVE    Bilirubin Urine NEGATIVE  NEGATIVE    Ketones, ur NEGATIVE  NEGATIVE (mg/dL)   Protein, ur NEGATIVE  NEGATIVE (mg/dL)   Urobilinogen, UA 0.2  0.0 - 1.0 (mg/dL)   Nitrite NEGATIVE  NEGATIVE     Leukocytes, UA NEGATIVE  NEGATIVE   URINE MICROSCOPIC-ADD ON     Status: Normal   Collection Time   12/06/11  8:24 PM      Component Value Range   Squamous Epithelial / LPF RARE  RARE    RBC / HPF 0-2  <3 (RBC/hpf)  COMPREHENSIVE METABOLIC PANEL     Status: Abnormal   Collection Time   12/07/11  4:40 AM      Component Value Range   Sodium 140  135 - 145 (mEq/L)   Potassium 3.6  3.5 - 5.1 (mEq/L)   Chloride 106  96 - 112 (mEq/L)   CO2 25  19 - 32 (mEq/L)   Glucose, Bld 130 (*) 70 - 99 (mg/dL)   BUN 10  6 - 23 (mg/dL)   Creatinine, Ser 4.09  0.50 - 1.35 (mg/dL)   Calcium 8.7  8.4 - 81.1 (mg/dL)   Total Protein 6.4  6.0 - 8.3 (g/dL)   Albumin 3.4 (*) 3.5 - 5.2 (g/dL)   AST 20  0 - 37 (U/L)   ALT 19  0 - 53 (U/L)   Alkaline Phosphatase 52  39 - 117 (U/L)   Total Bilirubin 0.4  0.3 - 1.2 (mg/dL)   GFR calc non Af Amer 56 (*) >90 (mL/min)   GFR calc Af Amer 65 (*) >90 (mL/min)  CBC     Status: Abnormal   Collection Time   12/07/11  4:40 AM      Component Value Range   WBC 11.2 (*) 4.0 - 10.5 (K/uL)   RBC 4.83  4.22 - 5.81 (MIL/uL)   Hemoglobin 13.4  13.0 - 17.0 (g/dL)   HCT 91.4  78.2 - 95.6 (%)   MCV 85.7  78.0 - 100.0 (fL)   MCH 27.7  26.0 - 34.0 (pg)   MCHC 32.4  30.0 - 36.0 (g/dL)   RDW 40.9  81.1 - 91.4 (%)   Platelets 254  150 - 400 (K/uL)  LIPID PANEL     Status: Normal   Collection Time   12/07/11  4:40 AM      Component Value Range   Cholesterol 139  0 - 200 (mg/dL)   Triglycerides 59  <782 (mg/dL)   HDL 52  >95 (mg/dL)   Total CHOL/HDL Ratio 2.7     VLDL 12  0 - 40 (mg/dL)   LDL Cholesterol 75  0 - 99 (mg/dL)    Studies/Results: Dg Chest 2 View  12/07/2011  *RADIOLOGY REPORT*  Clinical Data: Weakness in the left arm.  Stroke.  CHEST - 2 VIEW  Comparison: None.  Findings: Shallow inspiration with elevation of right hemidiaphragm.  Borderline heart size, likely normal for technique. Pulmonary vascularity is not increased.  No blunting of costophrenic angles.  No  pneumothorax.  No focal airspace consolidation.  IMPRESSION: No evidence of active pulmonary disease.  Original Report Authenticated By: Marlon Pel, M.D.   Ct Head Wo Contrast  12/06/2011  *RADIOLOGY REPORT*  Clinical Data: Dizziness, nausea, left-sided weakness, slurred speech  CT HEAD WITHOUT CONTRAST  Technique:  Contiguous axial images were obtained from the base of the skull through the vertex without contrast.  Comparison: None.  Findings: No evidence of parenchymal hemorrhage or extra-axial fluid collection. No mass lesion, mass effect, or midline shift.  No CT evidence of acute infarction.  Extensive subcortical white matter and periventricular small vessel ischemic changes, including the subcortical right frontal lobe (series 2/image 18).  Old right caudate head lacunar infarct (series 2/image 15).  Intracranial atherosclerosis.  Cerebral volume is age appropriate.  No ventriculomegaly.  Small mucous retention cyst in the bilateral maxillary sinuses. The visualized paranasal sinuses are otherwise clear. The mastoid air cells are unopacified.  No evidence of calvarial fracture.  IMPRESSION: No evidence of acute intracranial abnormality.  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  Original Report Authenticated By: Charline Bills, M.D.   Mr Brain Wo Contrast  12/07/2011  *RADIOLOGY REPORT*  Clinical Data:  Stroke.  Left-sided weakness  MRI HEAD WITHOUT CONTRAST MRA HEAD WITHOUT CONTRAST  Technique:  Multiplanar, multiecho pulse sequences of the brain and surrounding structures were obtained without intravenous contrast. Angiographic images of the head were obtained using MRA technique without contrast.  Comparison:  CT 12/06/2011  MRI HEAD  Findings:  Acute right posterior cerebral artery infarct. Restricted diffusion in the right thalamus as well as the posterior medial temporal lobe and right occipital lobe.  Acute infarct left superior cerebellar  territory.  Extensive chronic ischemic changes throughout the cerebral white matter bilaterally.  Chronic ischemia in the basal ganglia and thalami and pons bilaterally.  Chronic infarcts in the cerebellum. Multiple areas of chronic micro hemorrhage in the brain which may be related to hypertension or cerebral amyloid.  No mass lesion.  Chronic sinusitis with mucosal thickening in the paranasal sinuses.  Ventricles are not enlarged.  Age appropriate atrophy.  IMPRESSION: Acute right posterior cerebral artery infarct.  Acute left superior cerebellar infarct.  Extensive chronic ischemic changes.  Scattered areas of chronic micro hemorrhage.  These findings suggest  chronic severe hypertension.  MRA HEAD  Findings: Right vertebral artery is patent to the basilar. PICA is not visualized bilaterally.  Right AICA is visualized.  Left vertebral artery is small and has minimal flow and may be diffusely diseased or  have low flow.  Basilar is patent.  Occlusion of the right posterior cerebral artery at the origin.  Right superior cerebral artery is diseased but patent.  Left superior cerebellar artery is occluded.  Left posterior cerebral artery is patent.  Internal carotid artery is patent bilaterally without significant stenosis.  Anterior and middle cerebral arteries are patent bilaterally.  Atherosclerotic irregularity is present in the middle cerebral artery branches bilaterally with a moderately severe stenosis of the anterior division of the left middle cerebral artery. Moderate stenosis of the left A2 segment.  Scattered disease in the right middle cerebral artery.  Negative for cerebral aneurysm.  IMPRESSION: Diffuse intracranial atherosclerotic disease.  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  Occlusion of the left superior cerebellar  artery compatible with areas of acute infarction.  Original Report Authenticated By: Camelia Phenes, M.D.   Mr Mra Head/brain Wo Cm  12/07/2011  *RADIOLOGY REPORT*   Clinical Data:  Stroke.  Left-sided weakness  MRI HEAD WITHOUT CONTRAST MRA HEAD WITHOUT CONTRAST  Technique:  Multiplanar, multiecho pulse sequences of the brain and surrounding structures   IMPRESSION: Acute right posterior cerebral artery infarct.  Acute left superior cerebellar infarct.  Extensive chronic ischemic changes.  Scattered areas of chronic micro hemorrhage.  These findings suggest  chronic severe hypertension.   MRA HEAD  Findings: Right vertebral artery is patent to the basilar. PICA is not visualized bilaterally.  Right AICA is visualized.  IMPRESSION: Diffuse intracranial atherosclerotic disease.  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  Occlusion of the left superior cerebellar  artery compatible with areas of acute infarction.  Original Report Authenticated By: Camelia Phenes, M.D.    Medications:    .  stroke: mapping our early stages of recovery book   Does not apply Once  . amLODipine  10 mg Oral Daily  . amLODipine  10 mg Oral Once  . aspirin  300 mg Rectal Daily   Or  . aspirin  325 mg Oral Daily  . irbesartan  300 mg Oral Daily  . ondansetron  4 mg Intravenous Once  . sodium chloride  500 mL Intravenous Once    acetaminophen, ondansetron (ZOFRAN) IV, senna-docusate     . 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 Appreciate neurology input, continue aspirin and Plavix, carotid duplex showed no ICA stenosis. follow echocardiogram PT/OT eval Active Problems:  Hypertension, accelerated: Continue amlodipine and Irbesartan Hyperglycemia: Hemoglobin A1c 6.4%, place on  carb modified diet and monitor CBGs . Low TSH: Check free T4     LOS: 1 day   Shaleen Talamantez 12/07/2011, 10:34 AM

## 2011-12-07 NOTE — Evaluation (Signed)
Physical Therapy Evaluation Patient Details Name: Craig [name] MRN: [id number] DOB: 1951/01/04 Today's Date: 12/07/2011  Problem List:  Patient Active Problem List  Diagnoses  . Hypertension, accelerated  . CVA (cerebral infarction)    Past Medical History:  Past Medical History  Diagnosis Date  . Hypertension   . Allergy     RHINITIS  . Diverticulosis   . Colonic polyp    Past Surgical History: History reviewed. No pertinent past surgical history.  PT Assessment/Plan/Recommendation PT Assessment Clinical Impression Statement: pt admitted with R cerebral artery and L cerebellar artery infarcts.  The majjority of the pt's problems with mobility are due in part to L side inattension and incoordination with decr. static and dynamic balance that affect all mobility.  Pt follow commands well and worked very hard with therapy for a long period of time.  He should be a great inpt. rehab candidate. PT Recommendation/Assessment: Patient will need skilled PT in the acute care venue PT Problem List: Decreased strength;Decreased balance;Decreased mobility;Decreased coordination;Decreased knowledge of use of DME;Decreased safety awareness;Impaired sensation PT Therapy Diagnosis : Hemiplegia non-dominant side PT Plan PT Frequency: Min 4X/week PT Treatment/Interventions: DME instruction;Gait training;Stair training;Therapeutic activities;Patient/family education PT Recommendation Follow Up Recommendations: Inpatient Rehab Equipment Recommended: Defer to next venue PT Goals  Acute Rehab PT Goals PT Goal Formulation: With patient Time For Goal Achievement: 2 weeks Pt will go Supine/Side to Sit: with HOB 0 degrees;Other (comment) (min guard A) PT Goal: Supine/Side to Sit - Progress: Goal set today Pt will Sit at [facility] of Bed: 3-5 min;Other (comment);with bilateral upper extremity support;with unilateral upper extremity support (min guard A) PT Goal: Sit at Edge Of Bed - Progress: Goal set  today Pt will go Sit to Stand: with min assist;with upper extremity assist PT Goal: Sit to Stand - Progress: Goal set today Pt will Transfer Bed to Chair/Chair to Bed: with mod assist PT Transfer Goal: Bed to Chair/Chair to Bed - Progress: Goal set today Additional Goals Additional Goal #1: pt able to participate in pregait activity at EOB with least restrictive A device and moderate A PT Goal: Additional Goal #1 - Progress: Goal set today  PT Evaluation Precautions/Restrictions  Precautions Precautions: Fall Precaution Comments: significant visual deficit to L with L side inattension Restrictions Weight Bearing Restrictions: No Prior Functioning  Home Living Lives With: Spouse;Son Type of Home: House Home Layout: One level Home Access: Other (comment) (one small threshold) Bathroom Shower/Tub: Tub/shower unit;Curtain Firefighter: Standard Home Adaptive Equipment: None Additional Comments: wife able to use FMLA and son graduates from college in 4 weeks Prior Function Level of Independence: Independent with basic ADLs;Independent with gait;Independent with transfers Able to Take Stairs?: Yes Driving: Yes 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) Leisure: Hobbies-yes (Comment) Comments: playing a guitar , watching tv Cognition Cognition Arousal/Alertness: Awake/alert Overall Cognitive Status: Appears within functional limits for tasks assessed Orientation Level: Oriented to person;Oriented to place;Oriented to situation Sensation/Coordination Sensation Light Touch: Impaired by gross assessment Proprioception: Impaired by gross assessment Coordination Gross Motor Movements are Fluid and Coordinated: No Fine Motor Movements are Fluid and Coordinated: No Coordination and Movement Description: decreased coordination L upper and Lower extremities including dysmetria and general inattension to the position/function on the left side.  Noted truncal incoordination  also. Finger Nose Finger Test: dysmetria Heel Shin Test: dysmetria and truncal coordination failure Extremity Assessment RUE Assessment RUE Assessment: Within Functional Limits LUE Assessment LUE Assessment: Not tested RLE Assessment RLE Assessment: Within Functional Limits LLE Assessment LLE  Assessment: Within Functional Limits (grossly 4/5 and decr coordination) Mobility (including Balance) Bed Mobility Bed Mobility: Yes Rolling Right: 4: Min assist Rolling Right Details (indicate cue type and reason): vc's to find and bring his L UE right;  assist left LE off the bed.  pt unaware that the leg was stuck at EOB Right Sidelying to Sit: 4: Min assist Right Sidelying to Sit Details (indicate cue type and reason): vc's to add left extremities into mobility mix Sitting - Scoot to Edge of Bed: 3: Mod assist Sitting - Scoot to Edge of Bed Details (indicate cue type and reason): vc's for w/shift along with w/shift assist and assymmetrical scooting assist. Sit to Sidelying Right: 1: +2 Total assist;Patient percentage (comment);HOB flat;Other (comment) (pt >50%) Transfers Transfers: Yes Sit to Stand: 1: +2 Total assist;Patient percentage (comment);With upper extremity assist;From bed;Other (comment) (pt=60%) Sit to Stand Details (indicate cue type and reason): vc's for hand placement/sequencing; assist for truncal stability and  forward translation over his BOS. Stand to Sit: 1: +2 Total assist;Patient percentage (comment);To bed Ambulation/Gait Ambulation/Gait: No Stairs: No Wheelchair Mobility Wheelchair Mobility: No  Posture/Postural Control Posture/Postural Control: Postural limitations Postural Limitations: truncal drift to L and posteriorly in sitting with over compensation  when given cuing to correct to midline Balance Balance Assessed: Yes Static Sitting Balance Static Sitting - Balance Support: Right upper extremity supported;Left upper extremity supported;Feet supported Static  Sitting - Level of Assistance: 3: Mod assist;Other (comment) (to SBA after facil. atEOB; variable with activity change) Static Sitting - Comment/# of Minutes: 15 Dynamic Sitting Balance Dynamic Sitting - Balance Support: Feet supported;During functional activity Dynamic Sitting - Level of Assistance: 3: Mod assist Dynamic Sitting Balance - Compensations: assist and stability of w/shift Dynamic Sitting - Balance Activities: Lateral lean/weight shifting;Forward lean/weight shifting;Reaching across midline;Trunk control activities Exercise    End of Session PT - End of Session Activity Tolerance: Patient tolerated treatment well Patient left: in bed;with call bell in reach Nurse Communication: Mobility status for transfers General Behavior During Session: [facility] for tasks performed Cognition: [facility] for tasks performed  Soma Lizak, Eliseo Gum 12/07/2011, 1:09 PM  12/07/2011  Mills Bing, PT [phone] (pager)

## 2011-12-08 DIAGNOSIS — I6789 Other cerebrovascular disease: Secondary | ICD-10-CM

## 2011-12-08 LAB — GLUCOSE, CAPILLARY
Glucose-Capillary: 119 mg/dL — ABNORMAL HIGH (ref 70–99)
Glucose-Capillary: 113 mg/dL — ABNORMAL HIGH (ref 70–99)
Glucose-Capillary: 111 mg/dL — ABNORMAL HIGH (ref 70–99)

## 2011-12-08 MED ORDER — HYDRALAZINE HCL 20 MG/ML IJ SOLN
10.0000 mg | Freq: Four times a day (QID) | INTRAMUSCULAR | Status: DC | PRN
  Administered 2011-12-08: 10 mg via INTRAVENOUS
  Filled 2011-12-08: qty 0.5

## 2011-12-08 MED ORDER — HYDROCHLOROTHIAZIDE 25 MG PO TABS
25.0000 mg | ORAL_TABLET | Freq: Every day | ORAL | Status: DC
  Administered 2011-12-08 – 2011-12-10 (×3): 25 mg via ORAL
  Filled 2011-12-08 (×6): qty 1

## 2011-12-08 NOTE — Plan of Care (Signed)
Problem: Food- and Nutrition-Related Knowledge Deficit (NB-1.1) Goal: Nutrition education Formal process to instruct or train a patient/client in a skill or to impart knowledge to help patients/clients voluntarily manage or modify food choices and eating behavior to maintain or improve health.  Outcome: Completed/Met Date Met:  12/08/11 RD consulted for diet education for pt with elevated HgbA1c, 6.4%, elevated risk for DM. Pt admitted with CVA, has passed swallow study. Pt reports eating 1-2 meals daily on average, no recent weight loss, though may have gained some recently. RD explained what the HgbA1c value was and why it is important to make dietary changes to help prevent DM. Pt and wife were present at time of education. Pts primary concern is that he eats many concentrated sweets through out the day including regular sodas, cookies and candy bars. Pt understood recommendations to limit kcals from sweets and eat a healthy balanced diet. General healthful nutrition hand out was provided to pt's wife.  Diet: Carb Mod, PO intake 100% x 2 meals. Current weight is 241 lbs, pt reports he may have been confused when he told the RN is weight, RD weighed pt in bed with sheets, 1 blanket, and 1 pillow, weight was 211 lbs (95.9 kg). BMI = 30.3 kg/(m^2), Obesity. No additional nutrition interventions at this time. Please consult if nutrition services needed.   Clarene Duke MARIE [phone]

## 2011-12-08 NOTE — Progress Notes (Signed)
History: Craig [name] is an 61 y.o. male who reports going to bed normal at about midnight on the 3rd. On further conversation though he reports that he had been having weakness and numbness in the left arm for about two weeks and changes in his vision for the past week. Today awakened and was dizzy-felt as if the room was spinning. When he was able to get to the side of the bed felt nauseous. Attempted to go to the bathroom to vomit but fell due to imbalance. Was unable to get up and remained on the floor until his family found him abut 1730. Patient was brought in by EMS at that time.  LSN: Unclear  tPA Given: No: Unclear LSN  MRankin: 0   Subjective: NO dizziness or nausea.  Tired this am.    Objective: BP 166/93  Pulse 91  Temp(Src) 99.6 F (37.6 C) (Oral)  Resp 20  Ht 5\' 10"  (1.778 m)  Wt 109.317 kg (241 lb)  BMI 34.58 kg/m2  SpO2 96%  CBGs  Basename 12/08/11 0722 12/07/11 2235 12/07/11 1808  GLUCAP 119* 96 131*    Diet: regular, thin  Activity: up with assistance  DVT Prophylaxis: scd's   Medications: Scheduled:   . amLODipine  10 mg Oral Daily  . amLODipine  10 mg Oral Once  . aspirin  300 mg Rectal Daily   Or  . aspirin  325 mg Oral Daily  . clopidogrel  75 mg Oral Q breakfast  . irbesartan  300 mg Oral Daily    Neurologic Exam: Mental Status: Alert, oriented, thought content appropriate.  Speech fluent without evidence of aphasia. Able to follow 3 step commands without difficulty. Cranial Nerves: II- dense left hononymous hemianopsia.  Right gaze preference III/IV/VI-Extraocular movements intact.  Pupils reactive bilaterally. Bilateral end gaze nystagmus V/VII-Smile symmetric VIII-hearing grossly intact IX/X-normal gag XI-bilateral shoulder shrug XII-midline tongue extension Motor: weak left hand grip. +drift LUE.  Normal tone.  RUE 5/5.  Bilateral LE 5/5  Sensory: Dense left hemibody neglect.  No awareness to painful stim, unable to discriminate on  left. Plantars: Mute  bilaterally Cerebellar:  Significant dysmetria left UE.  mid dysmetria LLE.   Lab Results: Basic Metabolic Panel:  Lab 12/07/11 1610 12/06/11 1934  NA 140 138  K 3.6 4.3  CL 106 100  CO2 25 24  GLUCOSE 130* 121*  BUN 10 9  CREATININE 1.35 1.21  CALCIUM 8.7 9.6  MG -- --  PHOS -- --   Liver Function Tests:  Lab 12/07/11 0440 12/06/11 1934  AST 20 22  ALT 19 26  ALKPHOS 52 69  BILITOT 0.4 0.3  PROT 6.4 8.1  ALBUMIN 3.4* 4.4   CBC:  Lab 12/07/11 0440 12/06/11 1934  WBC 11.2* 13.9*  NEUTROABS -- 13.1*  HGB 13.4 15.3  HCT 41.4 45.9  MCV 85.7 85.0  PLT 254 263   Cardiac Enzymes:  Lab 12/06/11 1933  CKTOTAL 449*  CKMB 3.8  CKMBINDEX --  TROPONINI <0.30   CBG:  Lab 12/08/11 0722 12/07/11 2235 12/07/11 1808  GLUCAP 119* 96 131*   Hemoglobin A1C:  Lab 12/07/11 0440  HGBA1C 6.4*   Fasting Lipid Panel:  Lab 12/07/11 0440  CHOL 139  HDL 52  LDLCALC 75  TRIG 59  CHOLHDL 2.7  LDLDIRECT --   Thyroid Function Tests:  Lab 12/07/11 1703 12/07/11 0440  TSH -- 0.292*  T4TOTAL -- --  FREET4 1.09 --  T3FREE -- --  THYROIDAB -- --  Coagulation:  Lab 12/06/11 1934  LABPROT 12.8  INR 0.94   Urinalysis:  Lab 12/06/11 2024  COLORURINE YELLOW  LABSPEC 1.016  PHURINE 6.5  GLUCOSEU 250*  HGBUR TRACE*  BILIRUBINUR NEGATIVE  KETONESUR NEGATIVE  PROTEINUR NEGATIVE  UROBILINOGEN 0.2  NITRITE NEGATIVE  LEUKOCYTESUR NEGATIVE    Study Results:   12/07/2011    CHEST - 2 VIEW  Findings: Shallow inspiration with elevation of right hemidiaphragm.  Borderline heart size, likely normal for technique. Pulmonary vascularity is not increased.  No blunting of costophrenic angles.  No pneumothorax.  No focal airspace consolidation.  IMPRESSION: No evidence of active pulmonary disease.  Marlon Pel, M.D.   12/06/2011  CT HEAD WITHOUT CONTRAST  Findings: No evidence of parenchymal hemorrhage or extra-axial fluid collection. No mass lesion,  mass effect, or midline shift.  No CT evidence of acute infarction.  Extensive subcortical white matter and periventricular small vessel ischemic changes, including the subcortical right frontal lobe (series 2/image 18).  Old right caudate head lacunar infarct (series 2/image 15).  Intracranial atherosclerosis.  Cerebral volume is age appropriate.  No ventriculomegaly.  Small mucous retention cyst in the bilateral maxillary sinuses. The visualized paranasal sinuses are otherwise clear. The mastoid air cells are unopacified.  No evidence of calvarial fracture.  IMPRESSION: No evidence of acute intracranial abnormality.  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 Charline Bills, M.D.   12/07/2011 MRI HEAD WITHOUT CONTRAST  Findings:  Acute right posterior cerebral artery infarct. Restricted diffusion in the right thalamus as well as the posterior medial temporal lobe and right occipital lobe.  Acute infarct left superior cerebellar territory.  Extensive chronic ischemic changes throughout the cerebral white matter bilaterally.  Chronic ischemia in the basal ganglia and thalami and pons bilaterally.  Chronic infarcts in the cerebellum. Multiple areas of chronic micro hemorrhage in the brain which may be related to hypertension or cerebral amyloid.  No mass lesion.  Chronic sinusitis with mucosal thickening in the paranasal sinuses.  Ventricles are not enlarged.  Age appropriate atrophy.  IMPRESSION: Acute right posterior cerebral artery infarct.  Acute left superior cerebellar infarct. Extensive chronic ischemic changes.  Scattered areas of chronic micro hemorrhage.  These findings suggest  chronic severe hypertension.    12/07/2011 MRA HEAD WITHOUT CONTRAST  Findings: Right vertebral artery is patent to the basilar. PICA is not visualized bilaterally.  Right AICA is visualized.  Left vertebral artery is small and has minimal flow and may be diffusely diseased or have low flow.   Basilar is patent.  Occlusion of the right posterior cerebral artery at the origin.  Right superior cerebral artery is diseased but patent.  Left superior cerebellar artery is occluded.  Left posterior cerebral artery is patent.  Internal carotid artery is patent bilaterally without significant stenosis.  Anterior and middle cerebral arteries are patent bilaterally.  Atherosclerotic irregularity is present in the middle cerebral artery branches bilaterally with a moderately severe stenosis of the anterior division of the left middle cerebral artery. Moderate stenosis of the left A2 segment.  Scattered disease in the right middle cerebral artery.  Negative for cerebral aneurysm.  IMPRESSION: Diffuse intracranial atherosclerotic disease.  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  Occlusion of the left superior cerebellar  artery compatible with areas of acute infarction.  Camelia Phenes, M.D.   12/07/11 Carotid doppler no evidence of significant ICA stenosis  Therapies:  has been seen by CIR and determined to be a candidate.  PT:  Hemiplegia non-dominant side  OT:  Generalized weakness;Disturbance of vision;Ataxia  SLP:  He exhibits mild cognitive impairments with working memory, awareness and attention to left field of environment.   Assessment/Plan: 61 year male with  1. 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  Significant left neglect and hemiplegia. 2. LDL 75 3. A1C 6.4 4. HTN suboptimally controlled  Plan :  1. Aspirin 81 mg plus Plavix X 3 month followed by aspirin alone. 2. ECHO pending. 3. To CIR when bed available. 4.  Add HCTZ 25 mg daily 5. Nutrition consult- borderline diabetes. 6. D/C IV fluids, convert to saline lock  Patient has been seen and examined by Dr. Anne Hahn.   LOS: 2 days   Marya Fossa PA-C [facility] [phone] 12/08/2011  8:03 AM  Lesly Dukes

## 2011-12-08 NOTE — Progress Notes (Signed)
Subjective: Patient seen and examined, denies any complaints.  Objective: Vital signs in last 24 hours: Temp:  [97.9 F (36.6 C)-99.8 F (37.7 C)] 98 F (36.7 C) (04/06 1019) Pulse Rate:  [91-102] 94  (04/06 1019) Resp:  [18-20] 20  (04/06 1019) BP: (157-185)/(82-104) 174/94 mmHg (04/06 1019) SpO2:  [95 %-98 %] 95 % (04/06 1019) Weight change:  Last BM Date: 12/05/11  Intake/Output from previous day: 04/05 0701 - 04/06 0700 In: 600 [P.O.:600] Out: 800 [Urine:800]     Physical Exam: General: Alert, awake, oriented x3, in no acute distress.  HEENT: No bruits, no goiter.  Heart: Regular rate and rhythm, without murmurs, rubs, gallops.  Lungs: Clear to auscultation bilaterally.  Abdomen: Soft, nontender, nondistended, positive bowel sounds.  Extremities: No clubbing cyanosis or edema with positive pedal pulses.  Neuro: Left upper extremity paresis 4+/5, otherwise motor power is 5/5 in other extremities. Diminished left side the sensation to light touch. Left facial flattening        Lab Results: Results for orders placed during the hospital encounter of 12/06/11 (from the past 24 hour(s))  T4, FREE     Status: Normal   Collection Time   12/07/11  5:03 PM      Component Value Range   Free T4 1.09  0.80 - 1.80 (ng/dL)  GLUCOSE, CAPILLARY     Status: Abnormal   Collection Time   12/07/11  6:08 PM      Component Value Range   Glucose-Capillary 131 (*) 70 - 99 (mg/dL)  GLUCOSE, CAPILLARY     Status: Normal   Collection Time   12/07/11 10:35 PM      Component Value Range   Glucose-Capillary 96  70 - 99 (mg/dL)   Comment 1 Documented in Chart     Comment 2 Notify RN    GLUCOSE, CAPILLARY     Status: Abnormal   Collection Time   12/08/11  7:22 AM      Component Value Range   Glucose-Capillary 119 (*) 70 - 99 (mg/dL)    Studies/Results:   Medications:    . amLODipine  10 mg Oral Daily  . amLODipine  10 mg Oral Once  . aspirin  300 mg Rectal Daily   Or  . aspirin   325 mg Oral Daily  . clopidogrel  75 mg Oral Q breakfast  . hydrochlorothiazide  25 mg Oral Daily  . irbesartan  300 mg Oral Daily    acetaminophen, ondansetron (ZOFRAN) IV, senna-docusate     . 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  , continue aspirin and Plavix, carotid duplex showed no ICA stenosis. follow echocardiogram and TCD PT/OT Active Problems:  Hypertension, accelerated: Continue amlodipine and Irbesartan . HCTZ added by neurology Hyperglycemia: Hemoglobin A1c 6.4%, continue carb modified diet and monitor CBGs .  Low TSH:  Normal free T4 . Monitor TFTs as an outpatient Disposition: The CIR when bed available      LOS: 2 days   Craig [name] 12/08/2011, 11:02 AM

## 2011-12-08 NOTE — Progress Notes (Signed)
VASCULAR LAB PRELIMINARY  PRELIMINARY  PRELIMINARY  PRELIMINARY  TCD completed.    Preliminary report:  TCD completed  Kory Rains D, RVS 12/08/2011, 12:35 PM

## 2011-12-08 NOTE — Progress Notes (Signed)
  Echocardiogram 2D Echocardiogram has been performed.  Andy Allende, Real Cons 12/08/2011, 2:36 PM

## 2011-12-09 LAB — GLUCOSE, CAPILLARY
Glucose-Capillary: 114 mg/dL — ABNORMAL HIGH (ref 70–99)
Glucose-Capillary: 120 mg/dL — ABNORMAL HIGH (ref 70–99)

## 2011-12-09 MED ORDER — NEBIVOLOL HCL 10 MG PO TABS
10.0000 mg | ORAL_TABLET | Freq: Every day | ORAL | Status: DC
  Administered 2011-12-09 – 2011-12-10 (×2): 10 mg via ORAL
  Filled 2011-12-09 (×2): qty 1

## 2011-12-09 NOTE — Progress Notes (Signed)
History: Craig [name] is an 61 y.o. male who reports going to bed normal at about midnight on the 3rd. On further conversation though he reports that he had been having weakness and numbness in the left arm for about two weeks and changes in his vision for the past week. Today awakened and was dizzy-felt as if the room was spinning. When he was able to get to the side of the bed felt nauseous. Attempted to go to the bathroom to vomit but fell due to imbalance. Was unable to get up and remained on the floor until his family found him abut 1730. Patient was brought in by EMS at that time.   LSN: Unclear  tPA Given: No: Unclear LSN  MRankin: 0   Subjective:  No c/o.  Nurse reports incontinence remains an issue.  Has not been seen by PT over the weekend.  Objective: BP 178/97  Pulse 97  Temp(Src) 98 F (36.7 C) (Oral)  Resp 20  Ht 5\' 10"  (1.778 m)  Wt 109.317 kg (241 lb)  BMI 34.58 kg/m2  SpO2 99%  CBGs  Basename 12/09/11 0715 12/08/11 2123 12/08/11 1625 12/08/11 1118 12/08/11 0722 12/07/11 2235 12/07/11 1808  GLUCAP 117* 107* 111* 113* 119* 96 131*    Diet: regular, thin  Activity: up with assistance  DVT Prophylaxis: scd's   Medications: Scheduled:    . amLODipine  10 mg Oral Daily  . amLODipine  10 mg Oral Once  . aspirin  300 mg Rectal Daily   Or  . aspirin  325 mg Oral Daily  . clopidogrel  75 mg Oral Q breakfast  . hydrochlorothiazide  25 mg Oral Daily  . irbesartan  300 mg Oral Daily    Neurologic Exam: Mental Status: Alert, oriented, thought content appropriate.  Speech fluent without evidence of aphasia. Able to follow 3 step commands without difficulty. Cranial Nerves: II- dense left hononymous hemianopsia.  Right gaze preference III/IV/VI-Extraocular movements intact.  Pupils reactive bilaterally. Bilateral end gaze nystagmus V/VII-Smile symmetric VIII-hearing grossly intact IX/X-normal gag XI-bilateral shoulder shrug XII-midline tongue  extension Motor: weak left hand grip. +drift LUE.  Normal tone.  RUE 5/5.  Bilateral LE 5/5  Sensory: Dense left hemibody neglect.  No awareness to painful stim, unable to discriminate on left. Plantars: Mute  bilaterally Cerebellar:  Significant dysmetria left UE.  mid dysmetria LLE.   Lab Results: Basic Metabolic Panel:  Lab 12/07/11 9604 12/06/11 1934  NA 140 138  K 3.6 4.3  CL 106 100  CO2 25 24  GLUCOSE 130* 121*  BUN 10 9  CREATININE 1.35 1.21  CALCIUM 8.7 9.6  MG -- --  PHOS -- --   Liver Function Tests:  Lab 12/07/11 0440 12/06/11 1934  AST 20 22  ALT 19 26  ALKPHOS 52 69  BILITOT 0.4 0.3  PROT 6.4 8.1  ALBUMIN 3.4* 4.4   CBC:  Lab 12/07/11 0440 12/06/11 1934  WBC 11.2* 13.9*  NEUTROABS -- 13.1*  HGB 13.4 15.3  HCT 41.4 45.9  MCV 85.7 85.0  PLT 254 263   Cardiac Enzymes:  Lab 12/06/11 1933  CKTOTAL 449*  CKMB 3.8  CKMBINDEX --  TROPONINI <0.30   CBG:  Lab 12/09/11 0715 12/08/11 2123 12/08/11 1625 12/08/11 1118 12/08/11 0722 12/07/11 2235  GLUCAP 117* 107* 111* 113* 119* 96   Hemoglobin A1C:  Lab 12/07/11 0440  HGBA1C 6.4*   Fasting Lipid Panel:  Lab 12/07/11 0440  CHOL 139  HDL 52  LDLCALC  75  TRIG 59  CHOLHDL 2.7  LDLDIRECT --   Thyroid Function Tests:  Lab 12/07/11 1703 12/07/11 0440  TSH -- 0.292*  T4TOTAL -- --  FREET4 1.09 --  T3FREE -- --  THYROIDAB -- --   Coagulation:  Lab 12/06/11 1934  LABPROT 12.8  INR 0.94   Urinalysis:  Lab 12/06/11 2024  COLORURINE YELLOW  LABSPEC 1.016  PHURINE 6.5  GLUCOSEU 250*  HGBUR TRACE*  BILIRUBINUR NEGATIVE  KETONESUR NEGATIVE  PROTEINUR NEGATIVE  UROBILINOGEN 0.2  NITRITE NEGATIVE  LEUKOCYTESUR NEGATIVE    Study Results:  12/07/2011    CHEST - 2 VIEW  Findings: Shallow inspiration with elevation of right hemidiaphragm.  Borderline heart size, likely normal for technique. Pulmonary vascularity is not increased.  No blunting of costophrenic angles.  No pneumothorax.  No  focal airspace consolidation.  IMPRESSION: No evidence of active pulmonary disease.  Marlon Pel, M.D.   12/06/2011  CT HEAD WITHOUT CONTRAST  Findings: No evidence of parenchymal hemorrhage or extra-axial fluid collection. No mass lesion, mass effect, or midline shift.  No CT evidence of acute infarction.  Extensive subcortical white matter and periventricular small vessel ischemic changes, including the subcortical right frontal lobe (series 2/image 18).  Old right caudate head lacunar infarct (series 2/image 15).  Intracranial atherosclerosis.  Cerebral volume is age appropriate.  No ventriculomegaly.  Small mucous retention cyst in the bilateral maxillary sinuses. The visualized paranasal sinuses are otherwise clear. The mastoid air cells are unopacified.  No evidence of calvarial fracture.  IMPRESSION: No evidence of acute intracranial abnormality.  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 Charline Bills, M.D.   12/07/2011 MRI HEAD WITHOUT CONTRAST  Findings:  Acute right posterior cerebral artery infarct. Restricted diffusion in the right thalamus as well as the posterior medial temporal lobe and right occipital lobe.  Acute infarct left superior cerebellar territory.  Extensive chronic ischemic changes throughout the cerebral white matter bilaterally.  Chronic ischemia in the basal ganglia and thalami and pons bilaterally.  Chronic infarcts in the cerebellum. Multiple areas of chronic micro hemorrhage in the brain which may be related to hypertension or cerebral amyloid.  No mass lesion.  Chronic sinusitis with mucosal thickening in the paranasal sinuses.  Ventricles are not enlarged.  Age appropriate atrophy.  IMPRESSION: Acute right posterior cerebral artery infarct.  Acute left superior cerebellar infarct. Extensive chronic ischemic changes.  Scattered areas of chronic micro hemorrhage.  These findings suggest  chronic severe hypertension.    12/07/2011 MRA  HEAD WITHOUT CONTRAST  Findings: Right vertebral artery is patent to the basilar. PICA is not visualized bilaterally.  Right AICA is visualized.  Left vertebral artery is small and has minimal flow and may be diffusely diseased or have low flow.  Basilar is patent.  Occlusion of the right posterior cerebral artery at the origin.  Right superior cerebral artery is diseased but patent.  Left superior cerebellar artery is occluded.  Left posterior cerebral artery is patent.  Internal carotid artery is patent bilaterally without significant stenosis.  Anterior and middle cerebral arteries are patent bilaterally.  Atherosclerotic irregularity is present in the middle cerebral artery branches bilaterally with a moderately severe stenosis of the anterior division of the left middle cerebral artery. Moderate stenosis of the left A2 segment.  Scattered disease in the right middle cerebral artery.  Negative for cerebral aneurysm.  IMPRESSION: Diffuse intracranial atherosclerotic disease.  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  Occlusion of  the left superior cerebellar  artery compatible with areas of acute infarction.  Camelia Phenes, M.D.   12/07/11 Carotid doppler no evidence of significant ICA stenosis  12/08/11 2 D Echo: Left ventricle: The cavity size was normal. Wall thickness was normal. Systolic function was normal. The estimated ejection fraction was in the range of 60% to 65%. Wall motion was normal; there were no regional wall motion abnormalities. Doppler parameters are consistent with abnormal left ventricular relaxation (grade 1 diastolic dysfunction). - Left atrium: The atrium was at the upper limits of normal in size. - Atrial septum: No defect or patent foramen ovale was identified. - Tricuspid valve: Trivial regurgitation. - Pulmonary arteries: PA peak pressure: 35mm Hg (S). - Pericardium, extracardiac: There was no pericardial effusion.  Therapies:  has been seen by CIR and determined to  be a candidate. PT:  Hemiplegia non-dominant side  OT:  Generalized weakness;Disturbance of vision;Ataxia  SLP:  He exhibits mild cognitive impairments with working memory, awareness and attention to left field of environment.   Assessment/Plan: 61 year male with  1. 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  Significant left neglect and hemiplegia and incontinence. 2. LDL 75 3. A1C 6.4 4. HTN suboptimally controlled  Plan :  1. Aspirin 81 mg plus Plavix X 3 month followed by aspirin alone. 2. Mobilize out of bed with assistance. .3. To CIR when bed available. 4. Add bystolic 20 mg daily for BP and HR control. 5. Nutrition consult- borderline diabetes. 6. D/C IV fluids, convert to saline lock 7. ST reevaluate. Nursing staff had noted a change in the ability to swallow.  Patient has been seen and examined by Dr. Anne Hahn.   LOS: 3 days   Marya Fossa PA-C [facility] [phone] 12/09/2011  8:39 AM  JERNEJCIC,TARA Chestine Spore

## 2011-12-09 NOTE — Evaluation (Signed)
Clinical/Bedside Swallow Evaluation Patient Details  Name: Craig [name] MRN: [id number] DOB: [birth year] Today's Date: 12/09/2011  Past Medical History:  Past Medical History  Diagnosis Date  . Hypertension   . Allergy     RHINITIS  . Diverticulosis   . Colonic polyp    Past Surgical History: History reviewed. No pertinent past surgical history. 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  Significant left neglect and hemiplegia and incontinence. Rn repots pt observed to have very slow intake especially with pills, some anterior spillage form left corner of mouth. Concern that pt may have decreased safety with Po intake.    Assessment/Recommendations/Treatment Plan    SLP Assessment Clinical Impression Statement: Pt presents with normal oropharyngeal function, some oral dysphagia secondary to cognitive deficits - poor selective, divided attention, initiation. Pt with slow oral prepartion requiring cues to stay on task with consumption. SLP provided education to pt, family, RN regarding cueing during meals, encouringing self feeding. Pt does not require any f/u for swallow, SLp will continue to treat for cognition.  Risk for Aspiration: None Other Related Risk Factors: Cognitive impairment  Swallow Evaluation Recommendations Diet Recommendations: Regular;Thin liquid Liquid Administration via: Cup;Straw Medication Administration: Whole meds with liquid Supervision: Patient able to self feed;Intermittent supervision to cue for compensatory strategies Postural Changes and/or Swallow Maneuvers: Seated upright 90 degrees;Out of bed for meals Recommendations for Other Services: Rehab consult Follow up Recommendations: Inpatient Rehab  Treatment Plan Treatment Plan Recommendations: No treatment recommended at this time Speech Therapy Frequency: min 2x/week      Individuals Consulted Consulted and Agree with Results and Recommendations: Patient;Family member/caregiver Family Member Consulted: wife  Harlon Ditty, [place] CCC-SLP [phone]   Claudine Mouton 12/09/2011,2:43 PM

## 2011-12-09 NOTE — Progress Notes (Signed)
Subjective: Patient seen and examined, denies any complaints.  Objective: Vital signs in last 24 hours: Temp:  [98 F (36.7 C)-99.4 F (37.4 C)] 98 F (36.7 C) (04/07 0532) Pulse Rate:  [73-97] 97  (04/07 0532) Resp:  [20] 20  (04/07 0532) BP: (145-185)/(73-110) 178/97 mmHg (04/07 0532) SpO2:  [95 %-99 %] 99 % (04/07 0532) Weight change:  Last BM Date: 12/05/11  Intake/Output from previous day: 04/06 0701 - 04/07 0700 In: 360 [P.O.:360] Out: 200 [Urine:200]     Physical Exam: General: Alert, awake, oriented x3, in no acute distress.  HEENT: No bruits, no goiter.  Heart: Regular rate and rhythm, without murmurs, rubs, gallops.  Lungs: Clear to auscultation bilaterally.  Abdomen: Soft, nontender, nondistended, positive bowel sounds.  Extremities: No clubbing cyanosis or edema with positive pedal pulses.  Neuro: Left upper extremity paresis 4+/5, otherwise motor power is 5/5 in other extremities. Diminished left side the sensation to light touch. Left facial flattening    Lab Results: Results for orders placed during the hospital encounter of 12/06/11 (from the past 24 hour(s))  GLUCOSE, CAPILLARY     Status: Abnormal   Collection Time   12/08/11 11:18 AM      Component Value Range   Glucose-Capillary 113 (*) 70 - 99 (mg/dL)  GLUCOSE, CAPILLARY     Status: Abnormal   Collection Time   12/08/11  4:25 PM      Component Value Range   Glucose-Capillary 111 (*) 70 - 99 (mg/dL)  GLUCOSE, CAPILLARY     Status: Abnormal   Collection Time   12/08/11  9:23 PM      Component Value Range   Glucose-Capillary 107 (*) 70 - 99 (mg/dL)   Comment 1 Documented in Chart     Comment 2 Notify RN    GLUCOSE, CAPILLARY     Status: Abnormal   Collection Time   12/09/11  7:15 AM      Component Value Range   Glucose-Capillary 117 (*) 70 - 99 (mg/dL)   Comment 1 Notify RN     Comment 2 Documented in Chart      Studies/Results: Echo: Study Conclusions  - Left ventricle: The cavity size was  normal. Wall thickness was normal. Systolic function was normal. The estimated ejection fraction was in the range of 60% to 65%. Wall motion was normal; there were no regional wall motion abnormalities. Doppler parameters are consistent with abnormal left ventricular relaxation (grade 1 diastolic dysfunction). - Left atrium: The atrium was at the upper limits of normal in size. - Atrial septum: No defect or patent foramen ovale was identified. - Tricuspid valve: Trivial regurgitation. - Pulmonary arteries: PA peak pressure: 35mm Hg (S). - Pericardium, extracardiac: There was no pericardial effusion.   Medications:    . amLODipine  10 mg Oral Daily  . amLODipine  10 mg Oral Once  . aspirin  300 mg Rectal Daily   Or  . aspirin  325 mg Oral Daily  . clopidogrel  75 mg Oral Q breakfast  . hydrochlorothiazide  25 mg Oral Daily  . irbesartan  300 mg Oral Daily  . nebivolol  10 mg Oral Daily    acetaminophen, hydrALAZINE, ondansetron (ZOFRAN) IV, senna-docusate     . 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  , continue aspirin and Plavix for 3 months then continue with aspirin alone as per neurology recommendations. carotid  duplex showed no ICA stenosis.  echocardiogram as above showed no PFO ,TCD was done report pending. Continue PT/OT , ST evaluation pending Active Problems:  Hypertension, accelerated: Continue amlodipine and Irbesartan . HCTZ added yesterday and bystolic was added today by neurology. Hyperglycemia: Hemoglobin A1c 6.4%, continue carb modified diet and monitor CBGs . He was seen by dietitian today. Low TSH: Normal free T4 . Monitor TFTs as an outpatient  Disposition: [facility] when bed available        LOS: 3 days   Ceairra Mccarver 12/09/2011, 10:04 AM

## 2011-12-10 ENCOUNTER — Inpatient Hospital Stay (HOSPITAL_COMMUNITY)
Admission: RE | Admit: 2011-12-10 | Discharge: 2012-01-04 | DRG: 462 | Disposition: A | Discharge status: Home / Self Care | Payer: BC Managed Care – PPO | Source: Ambulatory Visit | Attending: Physical Medicine & Rehabilitation | Admitting: Physical Medicine & Rehabilitation

## 2011-12-10 DIAGNOSIS — R488 Other symbolic dysfunctions: Secondary | ICD-10-CM

## 2011-12-10 DIAGNOSIS — I10 Essential (primary) hypertension: Secondary | ICD-10-CM

## 2011-12-10 DIAGNOSIS — Z860100 Personal history of colon polyps, unspecified: Secondary | ICD-10-CM

## 2011-12-10 DIAGNOSIS — Z5189 Encounter for other specified aftercare: Secondary | ICD-10-CM

## 2011-12-10 DIAGNOSIS — Z79899 Other long term (current) drug therapy: Secondary | ICD-10-CM

## 2011-12-10 DIAGNOSIS — I6350 Cerebral infarction due to unspecified occlusion or stenosis of unspecified cerebral artery: Secondary | ICD-10-CM

## 2011-12-10 DIAGNOSIS — K5730 Diverticulosis of large intestine without perforation or abscess without bleeding: Secondary | ICD-10-CM

## 2011-12-10 DIAGNOSIS — I69993 Ataxia following unspecified cerebrovascular disease: Secondary | ICD-10-CM

## 2011-12-10 DIAGNOSIS — Z7902 Long term (current) use of antithrombotics/antiplatelets: Secondary | ICD-10-CM

## 2011-12-10 DIAGNOSIS — R29898 Other symptoms and signs involving the musculoskeletal system: Secondary | ICD-10-CM

## 2011-12-10 DIAGNOSIS — R279 Unspecified lack of coordination: Secondary | ICD-10-CM

## 2011-12-10 DIAGNOSIS — Z8249 Family history of ischemic heart disease and other diseases of the circulatory system: Secondary | ICD-10-CM

## 2011-12-10 DIAGNOSIS — Z7982 Long term (current) use of aspirin: Secondary | ICD-10-CM

## 2011-12-10 DIAGNOSIS — I639 Cerebral infarction, unspecified: Secondary | ICD-10-CM

## 2011-12-10 DIAGNOSIS — I6330 Cerebral infarction due to thrombosis of unspecified cerebral artery: Secondary | ICD-10-CM

## 2011-12-10 LAB — GLUCOSE, CAPILLARY
Glucose-Capillary: 109 mg/dL — ABNORMAL HIGH (ref 70–99)
Glucose-Capillary: 107 mg/dL — ABNORMAL HIGH (ref 70–99)

## 2011-12-10 MED ORDER — POLYETHYLENE GLYCOL 3350 17 G PO PACK
17.0000 g | PACK | Freq: Every day | ORAL | Status: DC | PRN
  Administered 2011-12-10 – 2011-12-16 (×2): 17 g via ORAL
  Filled 2011-12-10 (×2): qty 1

## 2011-12-10 MED ORDER — AMLODIPINE BESYLATE 10 MG PO TABS: 10.0000 mg | ORAL_TABLET | Freq: Every day | ORAL | Status: DC

## 2011-12-10 MED ORDER — NEBIVOLOL HCL 10 MG PO TABS: 10.0000 mg | ORAL_TABLET | Freq: Every day | ORAL | Status: DC

## 2011-12-10 MED ORDER — ACETAMINOPHEN 325 MG PO TABS: 650.0000 mg | ORAL_TABLET | ORAL | Status: DC | PRN

## 2011-12-10 MED ORDER — ASPIRIN 325 MG PO TABS: 325.0000 mg | ORAL_TABLET | Freq: Every day | ORAL | Status: DC

## 2011-12-10 MED ORDER — IRBESARTAN 300 MG PO TABS
300.0000 mg | ORAL_TABLET | Freq: Every day | ORAL | Status: DC
  Administered 2011-12-10 – 2011-12-18 (×9): 300 mg via ORAL
  Filled 2011-12-10 (×11): qty 1

## 2011-12-10 MED ORDER — NEBIVOLOL HCL 10 MG PO TABS
10.0000 mg | ORAL_TABLET | Freq: Every day | ORAL | Status: DC
  Administered 2011-12-10 – 2012-01-04 (×26): 10 mg via ORAL
  Filled 2011-12-10 (×27): qty 1

## 2011-12-10 MED ORDER — CLOPIDOGREL BISULFATE 75 MG PO TABS
75.0000 mg | ORAL_TABLET | Freq: Every day | ORAL | Status: DC
  Administered 2011-12-11 – 2012-01-04 (×25): 75 mg via ORAL
  Filled 2011-12-10 (×26): qty 1

## 2011-12-10 MED ORDER — SORBITOL 70 % SOLN
30.0000 mL | Freq: Every day | Status: DC | PRN
  Administered 2011-12-12 – 2011-12-19 (×2): 30 mL via ORAL
  Filled 2011-12-10 (×4): qty 30

## 2011-12-10 MED ORDER — HYDROCHLOROTHIAZIDE 25 MG PO TABS: 25.0000 mg | ORAL_TABLET | Freq: Every day | ORAL | Status: DC

## 2011-12-10 MED ORDER — IRBESARTAN 300 MG PO TABS: 300.0000 mg | ORAL_TABLET | Freq: Every day | ORAL | Status: DC

## 2011-12-10 MED ORDER — AMLODIPINE BESYLATE 10 MG PO TABS
10.0000 mg | ORAL_TABLET | Freq: Every day | ORAL | Status: DC
  Administered 2011-12-10 – 2012-01-04 (×26): 10 mg via ORAL
  Filled 2011-12-10 (×27): qty 1

## 2011-12-10 MED ORDER — CLOPIDOGREL BISULFATE 75 MG PO TABS: 75.0000 mg | ORAL_TABLET | Freq: Every day | ORAL | Status: DC

## 2011-12-10 MED ORDER — ASPIRIN 81 MG PO CHEW
81.0000 mg | CHEWABLE_TABLET | Freq: Every day | ORAL | Status: DC
  Administered 2011-12-10 – 2012-01-04 (×26): 81 mg via ORAL
  Filled 2011-12-10 (×27): qty 1

## 2011-12-10 MED ORDER — HYDROCHLOROTHIAZIDE 25 MG PO TABS
25.0000 mg | ORAL_TABLET | Freq: Every day | ORAL | Status: DC
  Administered 2011-12-10 – 2011-12-15 (×6): 25 mg via ORAL
  Filled 2011-12-10 (×7): qty 1

## 2011-12-10 NOTE — Progress Notes (Addendum)
Insurance has aproved inpt rehab admit for today. Patient and wife are aware and in agreement. Pager [phone] with questions. I have alerted Dr. Cleotis Lema. Approval ref # is [id number]

## 2011-12-10 NOTE — H&P (Signed)
Physical Medicine and Rehabilitation Admission H&P  Craig [name] is an 61 y.o. male.   Chief Complaint  Patient presents with  . Cerebrovascular Accident  : HPI: 61 year old right-handed African American male with history of hypertension admitted April 4 with left-sided weakness and facial droop with dizziness as well as episode of nausea vomiting. 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 MRA of the head with diffuse intracranial atherosclerotic disease as well as occlusion of right posterior cerebral artery and occlusion of the left superior cerebellar artery compatible with areas of acute infarct. Echocardiogram showed ejection fraction of 65% and grade 1 diastolic dysfunction without emboli . Carotid Dopplers with no significant ICA stenosis. Neurology consulted placed on aspirin plus Plavix x3 months followed by aspirin alone. Physical occupational therapy evaluations are pending.Mildly elevated HGB A1C 6.4 and blood sugars discontinued after showing good control. M.D. request physical medicine rehabilitation consult consider inpatient rehabilitation services. Rehabilitation felt this patient could benefit from an inpatient rehabilitation stay to maximize functional abilities.  Review of Systems  Gastrointestinal: Positive for nausea and vomiting.  Neurological: Positive for dizziness.  All other systems reviewed and are negative   Past Medical History  Diagnosis Date  . Hypertension   . Allergy     RHINITIS  . Diverticulosis   . Colonic polyp    History reviewed. No pertinent past surgical history. Family History  Problem Relation Age of Onset  . Cancer Mother   . Arthritis Mother   . Heart disease Mother   . Cancer Father   . Cancer Sister    Social History:  reports that he has never smoked. He has never used smokeless tobacco. He reports that he does not drink alcohol or use illicit  drugs. Allergies: No Known Allergies Medications Prior to Admission  Medication Dose Route Frequency Provider Last Rate Last Dose  .  stroke: mapping our early stages of recovery book   Does not apply Once Clydia Llano, MD      . acetaminophen (TYLENOL) tablet 650 mg  650 mg Oral Q4H PRN Rodolph Bong, MD   650 mg at 12/07/11 0215  . amLODipine (NORVASC) tablet 10 mg  10 mg Oral Daily Eduard Clos, MD   10 mg at 12/09/11 1021  . amLODipine (NORVASC) tablet 10 mg  10 mg Oral Once Clydia Llano, MD      . aspirin suppository 300 mg  300 mg Rectal Daily Eduard Clos, MD       Or  . aspirin tablet 325 mg  325 mg Oral Daily Eduard Clos, MD   325 mg at 12/09/11 1021  . clopidogrel (PLAVIX) tablet 75 mg  75 mg Oral Q breakfast Micki Riley, MD   75 mg at 12/10/11 1914  . hydrALAZINE (APRESOLINE) injection 10 mg  10 mg Intravenous Q6H PRN Jinger Neighbors, NP   10 mg at 12/08/11 2234  . hydrochlorothiazide (HYDRODIURIL) tablet 25 mg  25 mg Oral Daily Tara C Jernejcic, PA   25 mg at 12/09/11 1021  . irbesartan (AVAPRO) tablet 300 mg  300 mg Oral Daily Eduard Clos, MD   300 mg at 12/09/11 1022  . nebivolol (BYSTOLIC) tablet 10 mg  10 mg Oral Daily Tara C Jernejcic, PA   10 mg at 12/09/11 1022  . ondansetron (ZOFRAN) injection 4 mg  4 mg Intravenous Once Loren Racer, MD   4 mg at  12/06/11 1959  . ondansetron (ZOFRAN) injection 4 mg  4 mg Intravenous Q6H PRN Rodolph Bong, MD      . senna-docusate (Senokot-S) tablet 1 tablet  1 tablet Oral QHS PRN Eduard Clos, MD      . sodium chloride 0.9 % bolus 500 mL  500 mL Intravenous Once Loren Racer, MD   500 mL at 12/06/11 1958  . DISCONTD: 0.9 %  sodium chloride infusion   Intravenous Continuous Eduard Clos, MD 100 mL/hr at 12/07/11 0015     Medications Prior to Admission  Medication Sig Dispense Refill  . Olmesartan-Amlodipine-HCTZ 40-10-12.5 MG TABS Take 1 tablet by mouth daily.        Home: Home  Living Lives With: Spouse;Son Type of Home: House Home Layout: One level Home Access:  (one small threshold) Bathroom Shower/Tub: Tub/shower unit;Curtain Firefighter: Standard Home Adaptive Equipment: None Additional Comments: wife able to use FMLA and son graduates from college in 4 weeks   Functional History: Prior Function Level of Independence: Independent with basic ADLs;Independent with gait;Independent with transfers Able to Take Stairs?: Yes Driving: Yes Vocation: Full time employment Leisure: Hobbies-yes (Comment) Comments:   playing a guitar , watching tv   Functional Status:  Mobility: Bed Mobility Bed Mobility: Yes Rolling Right: 4: Min assist Rolling Right Details (indicate cue type and reason):   vc's to find and bring his L UE right; assist left LE off the bed. pt unaware that the leg was stuck at EOB  Right Sidelying to Sit: 4: Min assist Right Sidelying to Sit Details (indicate cue type and reason): vc's to activate BIL LE  Sitting - Scoot to Edge of Bed: 3: Mod assist Sitting - Scoot to Edge of Bed Details (indicate cue type and reason): vc's for w/shift along with w/shift assist and assymmetrical scooting assist. Sit to Sidelying Right: 1: +2 Total assist;Patient percentage (comment);HOB flat;Other (comment) Transfers Transfers: Yes Sit to Stand: 1: +2 Total assist;Patient percentage (comment);With upper extremity assist;From bed;Other (comment) (pt 60%) Sit to Stand Details (indicate cue type and reason): v/c for hand placement sequencing and (A) for core stability and forward translation for upright posture Stand to Sit: 1: +2 Total assist;Patient percentage (comment);To bed Ambulation/Gait Ambulation/Gait: No Stairs: No Wheelchair Mobility Wheelchair Mobility: No  ADL: ADL Eating/Feeding: Performed;Other (comment) (Min Guard (A)) Where Assessed - Eating/Feeding: Edge of bed (pt educated compensatory strategy for lunch) Upper Body Dressing:  Simulated;Moderate assistance Where Assessed - Upper Body Dressing: Sitting, bed;Unsupported Lower Body Dressing: Simulated;+1 Total assistance Where Assessed - Lower Body Dressing: Sitting, bed;Unsupported Toilet Transfer: Simulated;+2 Total assistance;Comment for patient % (pt 60%) Ambulation Related to ADLs: A few short steps taken at bedside no ambulation in room at this time. ADL Comments: Pt demonstrates motor planning deficits on Lt UE / LE and proprioception deficits. Pt demonstrates decreased sensation in Lt UE / LE. Pt provided weight bearing over Lt Ue and LE then demonstrating increased motor control during session.   Cognition: Cognition Overall Cognitive Status: Impaired Arousal/Alertness: Awake/alert Orientation Level: Disoriented to place Attention:  (left inattention) Memory: Impaired Memory Impairment: Decreased short term memory;Decreased recall of new information;Retrieval deficit;Storage deficit Decreased Short Term Memory: Verbal basic Awareness: Impaired Awareness Impairment: Intellectual impairment;Emergent impairment;Anticipatory impairment Problem Solving: Impaired Problem Solving Impairment: Functional complex;Verbal complex Safety/Judgment: Impaired Cognition Arousal/Alertness: Awake/alert Overall Cognitive Status: Appears within functional limits for tasks assessed Orientation Level: Disoriented to place   Blood pressure 147/97, pulse 77, temperature 99.4 F (37.4 C), temperature  source Oral, resp. rate 20, height 5\' 10"  (1.778 m), weight 109.317 kg (241 lb), SpO2 97.00%. Physical Exam  Nursing note and vitals reviewed.  Constitutional: He is oriented to person, place, and time. He appears well-developed.  HENT: Oral mucosa pink and moist. Head: Normocephalic and atraumatic.  Eyes: Conjunctivae are normal. Pupils are equal, round, and reactive to light.  Neck: Normal range of motion. Neck supple. No thyromegaly present.  Cardiovascular: Normal rate.  Regular rhythm without murmurs rubs or gallops. Pulmonary/Chest: Effort normal and breath sounds normal. He has no wheezes, rales, rhonchi.  Abdominal: He exhibits no distension. There is no tenderness. Bowel sounds are positive  Musculoskeletal: He exhibits no edema.  Neurological: He is alert and oriented to person, place, and time.  Left visual field cut. His gaze seemed to be slightly disconjugate but he denied any diplopia. He did have a few beats of nystagmus with right and left gaze as well as vertical/ upward gaze. Patient with mild apraxia and decreased fine motor skills. Left arm and leg are quite ataxic. He also has mild limb ataxia the right side. He is left hemisensory loss at 1 out of 2. He has diminished attention to the left side as well today. Cognitively he is quite appropriate and pleasant. He has excellent insight and awareness. Memory is intact. Skin: Skin is warm and dry.  Psychiatric: He has a normal mood and affect. His behavior is normal. Judgment and thought content normal   Results for orders placed during the hospital encounter of 12/06/11 (from the past 48 hour(s))  GLUCOSE, CAPILLARY     Status: Abnormal   Collection Time   12/08/11 11:18 AM      Component Value Range Comment   Glucose-Capillary 113 (*) 70 - 99 (mg/dL)   GLUCOSE, CAPILLARY     Status: Abnormal   Collection Time   12/08/11  4:25 PM      Component Value Range Comment   Glucose-Capillary 111 (*) 70 - 99 (mg/dL)   GLUCOSE, CAPILLARY     Status: Abnormal   Collection Time   12/08/11  9:23 PM      Component Value Range Comment   Glucose-Capillary 107 (*) 70 - 99 (mg/dL)    Comment 1 Documented in Chart      Comment 2 Notify RN     GLUCOSE, CAPILLARY     Status: Abnormal   Collection Time   12/09/11  7:15 AM      Component Value Range Comment   Glucose-Capillary 117 (*) 70 - 99 (mg/dL)    Comment 1 Notify RN      Comment 2 Documented in Chart     GLUCOSE, CAPILLARY     Status: Abnormal   Collection  Time   12/09/11 11:28 AM      Component Value Range Comment   Glucose-Capillary 149 (*) 70 - 99 (mg/dL)   GLUCOSE, CAPILLARY     Status: Abnormal   Collection Time   12/09/11  4:10 PM      Component Value Range Comment   Glucose-Capillary 114 (*) 70 - 99 (mg/dL)   GLUCOSE, CAPILLARY     Status: Abnormal   Collection Time   12/09/11  9:48 PM      Component Value Range Comment   Glucose-Capillary 120 (*) 70 - 99 (mg/dL)   GLUCOSE, CAPILLARY     Status: Abnormal   Collection Time   12/10/11  6:41 AM      Component Value Range Comment  Glucose-Capillary 109 (*) 70 - 99 (mg/dL)    No results found.  Post Admission Physician Evaluation: 1. Functional deficits secondary  to right posterior cerebral artery infarct and left superior cerebellar artery infarct 2. Patient is admitted to receive collaborative, interdisciplinary care between the physiatrist, rehab nursing staff, and therapy team. 3. Patient's level of medical complexity and substantial therapy needs in context of that medical necessity cannot be provided at a lesser intensity of care such as a SNF. 4. Patient has experienced substantial functional loss from his/her baseline which was documented above under the "Functional History" and "Functional Status" headings.  Judging by the patient's diagnosis, physical exam, and functional history, the patient has potential for functional progress which will result in measurable gains while on inpatient rehab.  These gains will be of substantial and practical use upon discharge  in facilitating mobility and self-care at the household level. 5. Physiatrist will provide 24 hour management of medical needs as well as oversight of the therapy plan/treatment and provide guidance as appropriate regarding the interaction of the two. 6. 24 hour rehab nursing will assist with bladder management, bowel management, safety, skin/wound care, disease management, medication administration and patient education  and  help integrate therapy concepts, techniques,education, etc. 7. PT will assess and treat for:  Lower extremity strength, range of motion, safety, neuromuscular reeducation, visual perceptual training,  and adaptive equipment.  Goals are: Modified independent to supervision. 8. OT will assess and treat for: Upper extremity strength, range of motion, safety, neuromuscular reeducation, visual perceptual training, ADLs, functional mobility.   Goals are: Modified independent to set up. 9. SLP will assess and treat for: Not applicable 10. Case Management and Social Worker will assess and treat for psychological issues and discharge planning. 11. Team conference will be held weekly to assess progress toward goals and to determine barriers to discharge. 12.  Patient will receive at least 3 hours of therapy per day at least 5 days per week. 13. ELOS and Prognosis: 2 to 3 weeks good   Medical Problem List and Plan: 1. 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 2.. DVT Prophylaxis/Anticoagulation: SCDs and ambulation. Consider screening Dopplers 3. Hypertension. Norvasc 10 mg daily, hydrochlorothiazide 25 mg daily, Avapro 300 mg daily, bystolic 10 mg daily. Monitor with increased activity. Review secondary stroke prevention with patient. 4. Elevated hemoglobin A1c: Sugars at this point are essentially within normal limits. Impressed the importance of appropriate diet 5. Mood: Social worker to  perform  depression screen. Team to provide ego support as appropriate. Patient seems fairly upbeat at this time.     Sherrilee Gilles.D.

## 2011-12-10 NOTE — PMR Pre-admission (Signed)
PMR Admission Coordinator Pre-Admission Assessment  Patient: Craig [name] is an 61 y.o., male MRN: [id number] DOB: 1951/05/27 Height: 5\' 10"  (177.8 cm) Weight: 109.317 kg (241 lb)  Insurance Information HMO:     PPO: yes     PCP:      IPA:      80/20:      OTHER:  PRIMARY: State BCBS      Policy#: [id number]      Subscriber: Pt CM Name: Elnita Maxwell      Phone#: [phone]     Fax#: [phone] Pre-Cert#: [id number]    Employer: [employer] Benefits:  Phone #: [phone]     Name: 12/10/11 Elease Hashimoto [phone] Eff. Date: 05/04/10     Deduct: $700      Out of Pocket Max: $3210      Life Max: none policy terms 6/30, 2013 for the year CIR: $233 copay per admission then 80% covered      SNF: 80% Outpatient: 80% no visit limits     Co-Pay: 20% Home Health: 80%      Co-Pay: 20% DME: 80%     Co-Pay: 20% Providers: in network SECONDARY: none       Medicaid Application Date:       Case Manager:  Disability Application Date: wife states she is filing for disabiltiy with social security. 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 Echocardiogram and carotid Dopplers were pending. Neurology consulted placed on aspirin plus Plavix x3 months followed by aspirin alone.  M.D. request physical medicine rehabilitation consult consider  inpatient rehabilitation services.  Total: 9     Past Medical History  Past Medical History  Diagnosis Date  . Hypertension   . Allergy     RHINITIS  . Diverticulosis   . Colonic polyp     Family History  family history includes Arthritis in his mother; Cancer in his father, mother, and sister; and Heart disease in his mother.  Prior Rehab/Hospitalizations:none  Current Medications  Current facility-administered medications:acetaminophen (TYLENOL) tablet 650 mg, 650 mg, Oral, Q4H PRN, Rodolph Bong, MD, 650 mg at 12/07/11 0215;  amLODipine (NORVASC) tablet 10 mg, 10 mg, Oral, Daily, Eduard Clos, MD, 10 mg at 12/10/11 1100;  amLODipine (NORVASC) tablet 10 mg, 10 mg, Oral, Once, Clydia Llano, MD;  aspirin suppository 300 mg, 300 mg, Rectal, Daily, Eduard Clos, MD aspirin tablet 325 mg, 325 mg, Oral, Daily, Eduard Clos, MD, 325 mg at 12/10/11 1100;  clopidogrel (PLAVIX) tablet 75 mg, 75 mg, Oral, Q breakfast, Micki Riley, MD, 75 mg at 12/10/11 8416;  hydrALAZINE (APRESOLINE) injection 10 mg, 10 mg, Intravenous, Q6H PRN, Jinger Neighbors, NP, 10 mg at 12/08/11 2234;  hydrochlorothiazide (HYDRODIURIL) tablet 25 mg, 25 mg, Oral, Daily, Tara C Jernejcic, PA, 25 mg at 12/10/11 1100 irbesartan (AVAPRO) tablet 300 mg, 300 mg,  Oral, Daily, Eduard Clos, MD, 300 mg at 12/10/11 1100;  nebivolol (BYSTOLIC) tablet 10 mg, 10 mg, Oral, Daily, Tara C Jernejcic, PA, 10 mg at 12/10/11 1100;  ondansetron (ZOFRAN) injection 4 mg, 4 mg, Intravenous, Q6H PRN, Rodolph Bong, MD;  senna-docusate (Senokot-S) tablet 1 tablet, 1 tablet, Oral, QHS PRN, Eduard Clos, MD  Patients Current Diet: Cardiac  Precautions / Restrictions Precautions Precautions: Fall Precaution Comments: significant visual deficit to L with L side inattension Restrictions Weight Bearing Restrictions: No   Prior Activity Level Community (5-7x/wk): active and working full time  Event organiser / Equipment Home Assistive Devices/Equipment: Eyeglasses Home Adaptive Equipment: None  Prior Functional Level Prior Function Level of Independence: Independent with basic ADLs;Independent with gait;Independent with transfers Able to Take Stairs?: Yes Driving: Yes 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) Vocation Requirements: driving abilities Leisure: Hobbies-yes (Comment) Comments:   playing a guitar , watching tv   Current Functional Level Cognition  Arousal/Alertness: Awake/alert Overall Cognitive Status: Impaired Overall Cognitive Status: Appears within functional limits for tasks assessed Orientation Level: Oriented X4 Attention:  (left inattention) Memory: Impaired Memory Impairment: Decreased short term memory;Decreased recall of new information;Retrieval deficit;Storage deficit Decreased Short Term Memory: Verbal basic Awareness: Impaired Awareness Impairment: Intellectual impairment;Emergent impairment;Anticipatory impairment Problem Solving: Impaired Problem Solving Impairment: Functional complex;Verbal complex Safety/Judgment: Impaired    Sensation  Light Touch: Impaired by gross assessment Proprioception: Impaired by gross assessment    Coordination  Gross Motor Movements are Fluid and Coordinated: No Fine Motor Movements are Fluid and Coordinated: No Coordination and Movement Description:   decreased coordination L upper and Lower extremities including dysmetria and general inattention to the position/function on the left side. Noted truncal incoordination also.  Finger Nose Finger Test: dysmetria (able to perform Rt side, deficits on Lt side) Heel Shin Test: dysmetria and truncal coordination failure    ADLs  Eating/Feeding: Performed;Other (comment) (Min Guard (A)) Where Assessed - Eating/Feeding: Edge of bed (pt educated compensatory strategy for lunch) Grooming: Performed;Teeth care;Wash/dry face;Minimal assistance;Other (comment) (mod v/c for  postures and visual attention to Lt side) Grooming Details (indicate cue type and reason): pt leaning to the left. Pt with Lt LE buckling and with v/c able to correct posture. pt using steady at sink level for (A) with balance by blocking BIL LE . Pt required (A) for holding tooth brush in Lt hand and applying paste with Rt hand. Pt provided hand over hand to place Lt hand on sink surface for weight bearing and Rt hand to perform grooming task. Pt required questioning cues to locate hot water knob to turn on water. Pt static standing for ~10 minutes and using mirror to (A) with correcting balance Where Assessed - Grooming: Standing at sink Upper Body Dressing: Simulated;Moderate assistance Where Assessed - Upper Body Dressing: Sitting, bed;Unsupported Lower Body Dressing: Simulated;+1 Total assistance Where Assessed - Lower Body Dressing: Sitting, bed;Unsupported Toilet Transfer: Simulated;Maximal assistance Toilet Transfer Details (indicate cue type and reason): Pt requires Lt LE blocking and pt unable to control descend to chair. Pt reaching with Rt side and LT side fatigued with uncontrolled descend.  Toilet Transfer Method: Stand Wellsite geologist: Raised toilet seat with arms (or 3-in-1 over toilet) Equipment Used: Other (comment) (steady) Ambulation Related to ADLs: Pt transported in this session with steady to sink level. pt able to maintain standing for ~15 minutes total this session ADL Comments: Pt completed grooming task at sink leve this session with increased awareness of  posture using mirror feedback. Pt 's wife reports confusion 12/09/11 PM hours. Pt required cueing this session to recall details of Friday eval and after extended time able to provided some details. Pt recalled admission coordinator visit this AM and her name "Britta Mccreedy" Pt pleasant and eager to participate with therapy. Pt states "it feels so good to get out of that bed" (Pt provided bed mobility to weight bear  on Lt prior to EOB )    Mobility  Bed Mobility: Yes Rolling Right: 4: Min assist Rolling Right Details (indicate cue type and reason):   vc's to find and bring his L UE right; assist left LE off the bed. pt unaware that the leg was stuck at EOB  Right Sidelying to Sit: 4: Min assist Right Sidelying to Sit Details (indicate cue type and reason): vc's to activate BIL LE  Supine to Sit: Other (comment) (min guard (A)- weight bearing exercise increased independenc) Sitting - Scoot to Edge of Bed: 4: Min assist Sitting - Scoot to [employer] of Bed Details (indicate cue type and reason): vc's for w/shift along with w/shift assist and assymmetrical scooting assist. Sit to Sidelying Right: 1: +2 Total assist;Patient percentage (comment);HOB flat;Other (comment)    Transfers  Transfers: Yes Sit to Stand: 3: Mod assist;With upper extremity assist;From bed;Other (comment) (elevated bed surface) Sit to Stand Details (indicate cue type and reason): pt required v/c for hand placement and sequencing of task. Pt with Lt hand releasing from steady without awareness. Pt requried v/c  Lt side awareness. Pt states "that hand doesnt feel like my own" Stand to Sit: 2: Max assist;With upper extremity assist;To chair/3-in-1 (uncontrolled)    Ambulation / Gait / Stairs / Psychologist, prison and probation services  Ambulation/Gait Ambulation/Gait: No Stairs: No Corporate treasurer: No    Posture / Balance Posture/Postural Control Posture/Postural Control: Postural limitations Postural Limitations: truncal drift to L and posteriorly in sitting with over compensation  when given cuing to correct to midline Static Sitting Balance Static Sitting - Balance Support: Right upper extremity supported;Left upper extremity supported;Feet supported Static Sitting - Level of Assistance: 3: Mod assist;Other (comment) (to SBA after facil. atEOB; variable with activity change) Static Sitting - Comment/# of Minutes: 15 Dynamic Sitting  Balance Dynamic Sitting - Balance Support: Feet supported;During functional activity Dynamic Sitting - Level of Assistance: 3: Mod assist Dynamic Sitting Balance - Compensations: assist and stability of w/shift Dynamic Sitting - Balance Activities: Lateral lean/weight shifting;Forward lean/weight shifting;Reaching across midline;Trunk control activities     Previous Home Environment Living Arrangements: Spouse/significant other;Children Lives With: Spouse;Son Type of Home: House Home Layout: One level Home Access:  (one small threshold) Bathroom Shower/Tub: Tub/shower unit;Curtain Firefighter: Standard Home Care Services: No Additional Comments: wife able to use FMLA and son graduates from college in 4 weeks  Discharge Living Setting Plans for Discharge Living Setting: Patient's home;Lives with (comment) (wife and 42 year old son) Type of Home at Discharge: House Discharge Home Layout: One level;Full bath on main level Discharge Home Access: Other (comment) (one small threshold) Do you have any problems obtaining your medications?: No  Social/Family/Support Systems Patient Roles: Spouse;Parent (bus driver for school system) Contact Information: Caydence Enck, wife Anticipated Caregiver: wife and son Anticipated Caregiver's Contact Information: cell is [phone] Ability/Limitations of Caregiver: wife can take FMLA, son graduates from [employer] in 4 weeks Caregiver Availability: 24/7 Discharge Plan Discussed with Primary Caregiver: Yes Is Caregiver In Agreement with Plan?: Yes Does Caregiver/Family have Issues with Lodging/Transportation while Pt  is in Rehab?: No  Goals/Additional Needs Patient/Family Goal for Rehab: Mod I to supervision with PT, supervision to min assist with OT Expected length of stay: ELOS 2 to 3 weeks Special Service Needs: Patient's father-in -law died from a CVA one year ago. Patient's sister-in-law, Sela Hilding, had CVA , was in CIR, and wife assists  in her care PTA Pt/Family Agrees to Admission and willing to participate: Yes Program Orientation Provided & Reviewed with Pt/Caregiver Including Roles  & Responsibilities: Yes  Patient Condition: Please see physician update to information in consult dated 12/07/11.  Preadmission Screen Completed By:  Clois Dupes, 12/10/2011 3:40 PM ______________________________________________________________________   Discussed status with Dr. Riley Kill on 4/8/13at 1542 and received telephone approval for admission today.  Admission Coordinator:  Clois Dupes, time 4098 Date 12/10/11

## 2011-12-10 NOTE — Discharge Summary (Addendum)
Patient ID: Craig [name] MRN: [id number] DOB/AGE: 1951-04-20 61 y.o.  Admit date: 12/06/2011 Discharge date: 12/10/2011  Primary Care Physician:  Carollee Herter, MD, MD   Discharge Diagnoses/Present on Admission:    .CVA (cerebral infarction) .Hypertension, accelerated Hyperglycemia  Medication List  As of 12/10/2011  4:58 PM   STOP taking these medications         Olmesartan-Amlodipine-HCTZ 40-10-12.5 MG Tabs         TAKE these medications         amLODipine 10 MG tablet   Commonly known as: NORVASC   Take 1 tablet (10 mg total) by mouth daily.      aspirin 325 MG tablet   Take 1 tablet (325 mg total) by mouth daily.      clopidogrel 75 MG tablet   Commonly known as: PLAVIX   Take 1 tablet (75 mg total) by mouth daily with breakfast.      hydrochlorothiazide 25 MG tablet   Commonly known as: HYDRODIURIL   Take 1 tablet (25 mg total) by mouth daily.      irbesartan 300 MG tablet   Commonly known as: AVAPRO   Take 1 tablet (300 mg total) by mouth daily.      nebivolol 10 MG tablet   Commonly known as: BYSTOLIC   Take 1 tablet (10 mg total) by mouth daily.             Consults:  Neurology   Significant Diagnostic Studies:  Dg Chest 2 View  12/07/2011  *RADIOLOGY REPORT*  Clinical Data: Weakness in the left arm.  Stroke.  CHEST - 2 VIEW  Comparison: None.  Findings: Shallow inspiration with elevation of right hemidiaphragm.  Borderline heart size, likely normal for technique. Pulmonary vascularity is not increased.  No blunting of costophrenic angles.  No pneumothorax.  No focal airspace consolidation.  IMPRESSION: No evidence of active pulmonary disease.  Original Report Authenticated By: Marlon Pel, M.D.   Ct Head Wo Contrast  12/06/2011  *RADIOLOGY REPORT*  Clinical Data: Dizziness, nausea, left-sided weakness, slurred speech  CT HEAD WITHOUT CONTRAST  Technique:  Contiguous axial images were obtained from the base of the skull through the vertex  without contrast.  Comparison: None.  Findings: No evidence of parenchymal hemorrhage or extra-axial fluid collection. No mass lesion, mass effect, or midline shift.  No CT evidence of acute infarction.  Extensive subcortical white matter and periventricular small vessel ischemic changes, including the subcortical right frontal lobe (series 2/image 18).  Old right caudate head lacunar infarct (series 2/image 15).  Intracranial atherosclerosis.  Cerebral volume is age appropriate.  No ventriculomegaly.  Small mucous retention cyst in the bilateral maxillary sinuses. The visualized paranasal sinuses are otherwise clear. The mastoid air cells are unopacified.  No evidence of calvarial fracture.  IMPRESSION: No evidence of acute intracranial abnormality.  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  Original Report Authenticated By: Charline Bills, M.D.   Mr Brain Wo Contrast  12/07/2011  *RADIOLOGY REPORT*  Clinical Data:  Stroke.  Left-sided weakness  MRI HEAD WITHOUT CONTRAST MRA HEAD WITHOUT CONTRAST  Technique:  Multiplanar, multiecho pulse sequences of the brain and surrounding structures were obtained without intravenous contrast. Angiographic images of the head were obtained using MRA technique without contrast.  Comparison:  CT 12/06/2011  MRI HEAD  Findings:  Acute right posterior cerebral artery infarct. Restricted diffusion in the right thalamus as well as the posterior medial temporal lobe and right  occipital lobe.  Acute infarct left superior cerebellar territory.  Extensive chronic ischemic changes throughout the cerebral white matter bilaterally.  Chronic ischemia in the basal ganglia and thalami and pons bilaterally.  Chronic infarcts in the cerebellum. Multiple areas of chronic micro hemorrhage in the brain which may be related to hypertension or cerebral amyloid.  No mass lesion.  Chronic sinusitis with mucosal thickening in the paranasal sinuses.  Ventricles  are not enlarged.  Age appropriate atrophy.  IMPRESSION: Acute right posterior cerebral artery infarct.  Acute left superior cerebellar infarct.  Extensive chronic ischemic changes.  Scattered areas of chronic micro hemorrhage.  These findings suggest  chronic severe hypertension.  MRA HEAD  Findings: Right vertebral artery is patent to the basilar. PICA is not visualized bilaterally.  Right AICA is visualized.  Left vertebral artery is small and has minimal flow and may be diffusely diseased or have low flow.  Basilar is patent.  Occlusion of the right posterior cerebral artery at the origin.  Right superior cerebral artery is diseased but patent.  Left superior cerebellar artery is occluded.  Left posterior cerebral artery is patent.  Internal carotid artery is patent bilaterally without significant stenosis.  Anterior and middle cerebral arteries are patent bilaterally.  Atherosclerotic irregularity is present in the middle cerebral artery branches bilaterally with a moderately severe stenosis of the anterior division of the left middle cerebral artery. Moderate stenosis of the left A2 segment.  Scattered disease in the right middle cerebral artery.  Negative for cerebral aneurysm.  IMPRESSION: Diffuse intracranial atherosclerotic disease.  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  Occlusion of the left superior cerebellar  artery compatible with areas of acute infarction.  Original Report Authenticated By: Camelia Phenes, M.D.   Mr Mra Head/brain Wo Cm  12/07/2011  *RADIOLOGY REPORT*  Clinical Data:  Stroke.  Left-sided weakness  MRI HEAD WITHOUT CONTRAST MRA HEAD WITHOUT CONTRAST  Technique:  Multiplanar, multiecho pulse sequences of the brain and surrounding structures were obtained without intravenous contrast. Angiographic images of the head were obtained using MRA technique without contrast.  Comparison:  CT 12/06/2011  MRI HEAD  Findings:  Acute right posterior cerebral artery infarct.  Restricted diffusion in the right thalamus as well as the posterior medial temporal lobe and right occipital lobe.  Acute infarct left superior cerebellar territory.  Extensive chronic ischemic changes throughout the cerebral white matter bilaterally.  Chronic ischemia in the basal ganglia and thalami and pons bilaterally.  Chronic infarcts in the cerebellum. Multiple areas of chronic micro hemorrhage in the brain which may be related to hypertension or cerebral amyloid.  No mass lesion.  Chronic sinusitis with mucosal thickening in the paranasal sinuses.  Ventricles are not enlarged.  Age appropriate atrophy.  IMPRESSION: Acute right posterior cerebral artery infarct.  Acute left superior cerebellar infarct.  Extensive chronic ischemic changes.  Scattered areas of chronic micro hemorrhage.  These findings suggest  chronic severe hypertension.  MRA HEAD  Findings: Right vertebral artery is patent to the basilar. PICA is not visualized bilaterally.  Right AICA is visualized.  Left vertebral artery is small and has minimal flow and may be diffusely diseased or have low flow.  Basilar is patent.  Occlusion of the right posterior cerebral artery at the origin.  Right superior cerebral artery is diseased but patent.  Left superior cerebellar artery is occluded.  Left posterior cerebral artery is patent.  Internal carotid artery is patent bilaterally without significant stenosis.  Anterior and middle cerebral  arteries are patent bilaterally.  Atherosclerotic irregularity is present in the middle cerebral artery branches bilaterally with a moderately severe stenosis of the anterior division of the left middle cerebral artery. Moderate stenosis of the left A2 segment.  Scattered disease in the right middle cerebral artery.  Negative for cerebral aneurysm.  IMPRESSION: Diffuse intracranial atherosclerotic disease.  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  Occlusion of the left superior cerebellar  artery compatible  with areas of acute infarction.  Original Report Authenticated By: Camelia Phenes, M.D.   Echo:  Study Conclusions  - Left ventricle: The cavity size was normal. Wall thickness was normal. Systolic function was normal. The estimated ejection fraction was in the range of 60% to 65%. Wall motion was normal; there were no regional wall motion abnormalities. Doppler parameters are consistent with abnormal left ventricular relaxation (grade 1 diastolic dysfunction). - Left atrium: The atrium was at the upper limits of normal in size. - Atrial septum: No defect or patent foramen ovale was identified. - Tricuspid valve: Trivial regurgitation. - Pulmonary arteries: PA peak pressure: 35mm Hg (S). - Pericardium, extracardiac: There was no pericardial effusion.      Brief H and P: For complete details please refer to admission H and P, but in brief  61 year-old male with history of hypertension was feeling dizzy today morning around 10:30 AM after which he had an episode of nausea vomiting and subsequent discharge feeling weak on the left side. He went to the bathroom and on the way back he fell to the floor. His wife to returned from work at 5:30 PM found him on the floor unconscious. He was easily arousable and was brought to the ER. CT head was negative for anything acute. Patient on exam was found to have left upper extremity and lower extremity weakness with left facial droop and unable to see the left side of the visual field in both eyes with some slurred speech. Patient is not a candidate for TPA because the exact time of onset is unclear. Neurologist on call was called and they will be seeing patient in consult and hospitalist has been requested admission.  Patient denies any chest pain, shortness of breath, palpitations, headache, abdominal pain or diarrhea.   Hospital Course:  *CVA (cerebral infarction) secondary to intracranial atherosclerosis.  Patient was admitted to neurology  floor, stroke workup was initiated and neurology service was consulted. MRI showed multiple infarcts as above,carotid duplex showed no ICA stenosis. echocardiogram as above showed no PFO ,TCD was done report pending.  Patient was started on aspirin and Plavix and as per neurology recommendation is to continue aspirin and Plavix for 3 months then continue with aspirin alone     PT/OT , ST and inpatient rehabilitation admission was recommended, patient was accepted today to Active Problems:  Hypertension, accelerated: Continue amlodipine and Irbesartan . HCTZ  and bystolic where added . Continue to monitor and adjust medication as needed.  Hyperglycemia: Hemoglobin A1c 6.4%, she was placed on carb modified diet and seen by dietitian. monitor CBGs .  Low TSH: Normal free T4 . Monitor TFTs as an outpatient   Seen and examined by me today, denies any complaints and he is stable for discharge to inpatient rehabilitation.  Filed Vitals:   12/10/11 1514  BP: 115/73  Pulse: 67  Temp: 98.3 F (36.8 C)  Resp: 20   General: Alert, awake, oriented x3, in no acute distress.  HEENT: No bruits, no goiter.  Heart: Regular rate  and rhythm, without murmurs, rubs, gallops.  Lungs: Clear to auscultation bilaterally.  Abdomen: Soft, nontender, nondistended, positive bowel sounds.  Extremities: No clubbing cyanosis or edema with positive pedal pulses.  Neuro: Left upper extremity paresis 4+/5, otherwise motor power is 5/5 in other extremities. Diminished left side the sensation to light touch. Left facial flattening    Disposition and Follow-up:  Inpatient rehabilitation Follow his PCP and neurology   Time spent on Discharge: Approximately 45 minutes   Signed: Kamiryn Bezanson 12/10/2011, 4:58 PM

## 2011-12-10 NOTE — Progress Notes (Addendum)
I have begun pre certification with [employer] for an admit to inpatient acute rehabilitation. I await their decision. Please call with any questions. Pager [phone] I met with patient and his wife at bedside to discuss rehab venue. Both are in agreement to admit. I await insurance approval and can admit today.

## 2011-12-10 NOTE — Progress Notes (Signed)
Utilization Review Completed.Craig [name] T4/04/2012   

## 2011-12-10 NOTE — Discharge Instructions (Signed)
STROKE/TIA DISCHARGE INSTRUCTIONS SMOKING Cigarette smoking nearly doubles your risk of having a stroke & is the single most alterable risk factor  If you smoke or have smoked in the last 12 months, you are advised to quit smoking for your health.  Most of the excess cardiovascular risk related to smoking disappears within a year of stopping.  Ask you doctor about anti-smoking medications  [place] Quit Line: 1-800-QUIT NOW  Free Smoking Cessation Classes [phone]  CHOLESTEROL Know your levels; limit fat & cholesterol in your diet  Lipid Panel     Component Value Date/Time   CHOL 139 12/07/2011 0440   TRIG 59 12/07/2011 0440   HDL 52 12/07/2011 0440   CHOLHDL 2.7 12/07/2011 0440   VLDL 12 12/07/2011 0440   LDLCALC 75 12/07/2011 0440      Many patients benefit from treatment even if their cholesterol is at goal.  Goal: Total Cholesterol (CHOL) less than 160  Goal:  Triglycerides (TRIG) less than 150  Goal:  HDL greater than 40  Goal:  LDL (LDLCALC) less than 100   BLOOD PRESSURE American Stroke Association blood pressure target is less that 120/80 mm/Hg  Your discharge blood pressure is:  BP: 147/97 mmHg  Monitor your blood pressure  Limit your salt and alcohol intake  Many individuals will require more than one medication for high blood pressure  DIABETES (A1c is a blood sugar average for last 3 months) Goal HGBA1c is under 7% (HBGA1c is blood sugar average for last 3 months)  Diabetes: No known diagnosis of diabetes    Lab Results  Component Value Date   HGBA1C 6.4* 12/07/2011     Your HGBA1c can be lowered with medications, healthy diet, and exercise.  Check your blood sugar as directed by your physician  Call your physician if you experience unexplained or low blood sugars.  PHYSICAL ACTIVITY/REHABILITATION Goal is 30 minutes at least 4 days per week  Activity: Increase activity slowly,, No driving, and Walk with assistance, Therapies: Physical Therapy: Inpatient Rehab  Unit at [facility], Occupational Therapy: Inpatient Rehab Unit at [facility] and Speech Therapy: Inpatient Rehab Unit at [facility] Return to work: n/a   Activity decreases your risk of heart attack and stroke and makes your heart stronger.  It helps control your weight and blood pressure; helps you relax and can improve your mood.  Participate in a regular exercise program.  Talk with your doctor about the best form of exercise for you (dancing, walking, swimming, cycling).  DIET/WEIGHT Goal is to maintain a healthy weight  Your discharge diet is: Cardiac thin liquids Your height is:  Height: 5\' 10"  (177.8 cm) Your current weight is: Weight: 109.317 kg (241 lb) Your Body Mass Index (BMI) is:  BMI (Calculated): 34.7   Following the type of diet specifically designed for you will help prevent another stroke.  Your goal weight range is:  ***  Your goal Body Mass Index (BMI) is 19-24.  Healthy food habits can help reduce 3 risk factors for stroke:  High cholesterol, hypertension, and excess weight.  RESOURCES Stroke/Support Group:  Call [phone]   STROKE EDUCATION PROVIDED/REVIEWED AND GIVEN TO PATIENT Stroke warning signs and symptoms How to activate emergency medical system (call 911). Medications prescribed at discharge. Need for follow-up after discharge. Personal risk factors for stroke. Pneumonia vaccine given: {STROKE DC YES/NO/DATE:22363} Flu vaccine given: {STROKE DC YES/NO/DATE:22363} My questions have been answered, the writing is legible, and I understand these instructions.  I will  adhere to these goals & educational materials that have been provided to me after my discharge from the hospital.

## 2011-12-10 NOTE — Progress Notes (Signed)
Occupational Therapy Treatment Patient Details Name: Craig [name] MRN: [id number] DOB: 06/23/1951 Today's Date: 12/10/2011  OT Assessment/Plan OT Assessment/Plan Comments on Treatment Session: Pt progressing this session demonstrating excellent CIR candidate. Pt with visual, balance and motor planning deficits noted on Lt side. OT Goals ADL Goals Pt Will Transfer to Toilet: with mod assist;3-in-1 ADL Goal: Toilet Transfer - Progress: Progressing toward goals Pt Will Perform Toileting - Clothing Manipulation: with mod assist;Sitting on 3-in-1 or toilet ADL Goal: Toileting - Clothing Manipulation - Progress: Progressing toward goals Pt Will Perform Toileting - Hygiene: with mod assist;Sit to stand from 3-in-1/toilet Miscellaneous OT Goals Miscellaneous OT Goal #1: Pt will demonstrate bed mobility min guard with min v/c for sequencing  OT Goal: Miscellaneous Goal #1 - Progress: Progressing toward goals Miscellaneous OT Goal #2: Pt will sit EOB for 5 minutes without LOB and self correcting posterior using mirror or environmental cues as guide as precursor to basic transfer OT Goal: Miscellaneous Goal #2 - Progress: Met  OT Treatment Precautions/Restrictions  Precautions Precautions: Fall Precaution Comments: significant visual deficit to L with L side inattension Restrictions Weight Bearing Restrictions: No   ADL ADL Grooming: Performed;Teeth care;Wash/dry face;Minimal assistance;Other (comment) (mod v/c for postures and visual attention to Lt side) Grooming Details (indicate cue type and reason): pt leaning to the left. Pt with Lt LE buckling and with v/c able to correct posture. pt using steady at sink level for (A) with balance by blocking BIL LE . Pt required (A) for holding tooth brush in Lt hand and applying paste with Rt hand. Pt provided hand over hand to place Lt hand on sink surface for weight bearing and Rt hand to perform grooming task. Pt required questioning cues to locate hot  water knob to turn on water. Pt static standing for ~10 minutes and using mirror to (A) with correcting balance Where Assessed - Grooming: Standing at sink Toilet Transfer: Simulated;Maximal assistance Toilet Transfer Details (indicate cue type and reason): Pt requires Lt LE blocking and pt unable to control descend to chair. Pt reaching with Rt side and LT side fatigued with uncontrolled descend.  Toilet Transfer Method: Stand Wellsite geologist: Raised toilet seat with arms (or 3-in-1 over toilet) Equipment Used: Other (comment) (steady) Ambulation Related to ADLs: Pt transported in this session with steady to sink level. pt able to maintain standing for ~15 minutes total this session ADL Comments: Pt completed grooming task at sink leve this session with increased awareness of posture using mirror feedback. Pt 's wife reports confusion 12/09/11 PM hours. Pt required cueing this session to recall details of Friday eval and after extended time able to provided some details. Pt recalled admission coordinator visit this AM and her name "Britta Mccreedy" Pt pleasant and eager to participate with therapy. Pt states "it feels so good to get out of that bed" (Pt provided bed mobility to weight bear on Lt prior to EOB ) Mobility  Bed Mobility Bed Mobility: Yes Supine to Sit: Other (comment) (min guard (A)- weight bearing exercise increased independenc) Sitting - Scoot to Edge of Bed: 4: Min assist Transfers Transfers: Yes Sit to Stand: 3: Mod assist;With upper extremity assist;From bed;Other (comment) (elevated bed surface) Sit to Stand Details (indicate cue type and reason): pt required v/c for hand placement and sequencing of task. Pt with Lt hand releasing from steady without awareness. Pt requried v/c  Lt side awareness. Pt states "that hand doesnt feel like my own" Stand to Sit: 2: Max assist;With upper  extremity assist;To chair/3-in-1 (uncontrolled) Exercises    End of Session OT - End of  Session Equipment Utilized During Treatment: Gait belt;Other (comment) (steady) Activity Tolerance: Patient tolerated treatment well Patient left: in chair;with call bell in reach;with family/visitor present (wife) Nurse Communication: Mobility status for transfers;Mobility status for ambulation General Behavior During Session: [facility] for tasks performed Cognition: Impaired Cognitive Impairment: slow to process and delayed recall of events. high level deficits noted  Lucile Shutters  12/10/2011, 11:19 AM Pager: [phone]

## 2011-12-10 NOTE — Progress Notes (Signed)
Stroke Team Progress Note  HISTORY Craig [name] is an 61 y.o. male who reports going to bed normal at about midnight on the 12/05/2011. On further conversation though he reports that he had been having weakness and numbness in the left arm for about two weeks and changes in his vision for the past week. 12/06/2011 awakened and was dizzy-felt as if the room was spinning. When he was able to get to the side of the bed felt nauseous. Attempted to go to the bathroom to vomit but fell due to imbalance. Was unable to get up and remained on the floor until his family found him abut 1730. Patient was brought in by EMS at that time. Patient was not a TPA candidate secondary to unknown time of stroke onset. he was admitted for further evaluation and treatment.  SUBJECTIVE His wife is at the bedside. She reports confusion yesterday afternoon; no other symptoms. Confused resolved this am. Overall he feels his condition is gradually improving. Rehab admissions coordinator is at the bedside.  OBJECTIVE Filed Vitals:   12/09/11 1832 12/09/11 2108 12/10/11 0200 12/10/11 0602  BP: 147/89 134/79 130/78 147/97  Pulse: 80 76 78 77  Temp: 99.1 F (37.3 C) 99.7 F (37.6 C) 98.8 F (37.1 C) 99.4 F (37.4 C)  TempSrc: Oral Oral Oral Oral  Resp: 20 20 20 20   Height:      Weight:      SpO2: 94% 96% 96% 97%   CBG (last 3)  Basename 12/10/11 0641 12/09/11 2148 12/09/11 1610  GLUCAP 109* 120* 114*   Intake/Output from previous day:   IV Fluid Intake:    Medications    . amLODipine  10 mg Oral Daily  . amLODipine  10 mg Oral Once  . aspirin  300 mg Rectal Daily   Or  . aspirin  325 mg Oral Daily  . clopidogrel  75 mg Oral Q breakfast  . hydrochlorothiazide  25 mg Oral Daily  . irbesartan  300 mg Oral Daily  . nebivolol  10 mg Oral Daily  PRN acetaminophen, hydrALAZINE, ondansetron (ZOFRAN) IV, senna-docusate  Diet:  Cardiac thin liquids Activity:  Up with assistance DVT Prophylaxis:  SCDs   Significant  Diagnostic Studies: CBC    Component Value Date/Time   WBC 11.2* 12/07/2011 0440   RBC 4.83 12/07/2011 0440   HGB 13.4 12/07/2011 0440   HCT 41.4 12/07/2011 0440   PLT 254 12/07/2011 0440   MCV 85.7 12/07/2011 0440   MCH 27.7 12/07/2011 0440   MCHC 32.4 12/07/2011 0440   RDW 13.9 12/07/2011 0440   LYMPHSABS 0.7 12/06/2011 1934   MONOABS 0.1 12/06/2011 1934   EOSABS 0.0 12/06/2011 1934   BASOSABS 0.0 12/06/2011 1934   CMP    Component Value Date/Time   NA 140 12/07/2011 0440   K 3.6 12/07/2011 0440   CL 106 12/07/2011 0440   CO2 25 12/07/2011 0440   GLUCOSE 130* 12/07/2011 0440   BUN 10 12/07/2011 0440   CREATININE 1.35 12/07/2011 0440   CREATININE 1.37* 05/15/2011 1154   CALCIUM 8.7 12/07/2011 0440   PROT 6.4 12/07/2011 0440   ALBUMIN 3.4* 12/07/2011 0440   AST 20 12/07/2011 0440   ALT 19 12/07/2011 0440   ALKPHOS 52 12/07/2011 0440   BILITOT 0.4 12/07/2011 0440   GFRNONAA 56* 12/07/2011 0440   GFRAA 65* 12/07/2011 0440   COAGS Lab Results  Component Value Date   INR 0.94 12/06/2011   Lipid Panel    Component  Value Date/Time   CHOL 139 12/07/2011 0440   TRIG 59 12/07/2011 0440   HDL 52 12/07/2011 0440   CHOLHDL 2.7 12/07/2011 0440   VLDL 12 12/07/2011 0440   LDLCALC 75 12/07/2011 0440   HgbA1C  Lab Results  Component Value Date   HGBA1C 6.4* 12/07/2011   Urine Drug Screen  No results found for this basename: labopia, cocainscrnur, labbenz, amphetmu, thcu, labbarb    Alcohol Level No results found for this basename: eth    CT of the brain  12/06/2011 No evidence of acute intracranial abnormality. 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  MRI of the brain  12/07/2011 Acute right posterior cerebral artery infarct. Acute left superior cerebellar infarct. Extensive chronic ischemic changes. Scattered areas of chronic micro hemorrhage. These findings suggest chronic severe hypertension.  MRA of the brain  12/07/2011 Diffuse intracranial atherosclerotic disease. Occlusion of the right posterior  cerebral artery. Occlusion of the left superior cerebellar artery compatible with areas of acute infarction.  2D Echocardiogram  12/08/2011 EF 60-65% with no source of embolus.   Carotid Doppler  12/07/2011 No internal carotid artery stenosis bilaterally. Vertebrals with antegrade flow bilaterally.   CXR  12/07/2011  No evidence of active pulmonary disease.   EKG  sinus tachycardia.   Physical Exam  Mental Status:  Alert, oriented, thought content appropriate. Speech fluent without evidence of aphasia. Able to follow 3 step commands without difficulty.  Cranial Nerves:  II- dense left hononymous hemianopsia. Right gaze preference  III/IV/VI-Extraocular movements intact. Pupils reactive bilaterally. Bilateral end gaze nystagmus  V/VII-Smile symmetric  VIII-hearing grossly intact  IX/X-normal gag  XI-bilateral shoulder shrug  XII-midline tongue extension  Motor: weak left hand grip. +drift LUE. Normal tone. RUE 5/5. Bilateral LE 5/5  Sensory: Dense left hemibody neglect. No awareness to painful stim, unable to discriminate on left.  Plantars: Mute bilaterally  Cerebellar: Significant dysmetria left UE. mid dysmetria LLE.   ASSESSMENT Mr. Shad Ledvina is a 61 y.o. 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 On no antiplatelets prior admission. Now on aspirin 81 mg orally every day and clopidogrel 75 mg orally every day for secondary stroke prevention. Patient with resultant left neglect, hemiplegia requiring rehabilitation.  -hypertension -hyperglycemia, HgbA1c 6.4  Hospital day # 4  TREATMENT/PLAN - Continue aspirin 81 mg orally every day and clopidogrel 75 mg orally every day for secondary stroke prevention x 3 mo then aspirin alone - rehab  when bed available -Stroke Service will sign off. Follow up with Dr. Pearlean Brownie in 2 months.  Joaquin Music, ANP-BC, GNP-BC [facility] Pager: [phone] 12/10/2011 8:22 AM  Dr. Delia Heady, Stroke Center Medical Director, has personally reviewed chart, pertinent data, examined the patient and developed the plan of care.

## 2011-12-11 ENCOUNTER — Encounter (HOSPITAL_COMMUNITY): Payer: Self-pay | Admitting: *Deleted

## 2011-12-11 DIAGNOSIS — I639 Cerebral infarction, unspecified: Secondary | ICD-10-CM

## 2011-12-11 DIAGNOSIS — I6330 Cerebral infarction due to thrombosis of unspecified cerebral artery: Secondary | ICD-10-CM

## 2011-12-11 DIAGNOSIS — Z5189 Encounter for other specified aftercare: Secondary | ICD-10-CM

## 2011-12-11 LAB — COMPREHENSIVE METABOLIC PANEL
ALT: 18 U/L (ref 0–53)
CO2: 27 mEq/L (ref 19–32)
Calcium: 9.4 mg/dL (ref 8.4–10.5)
Creatinine, Ser: 1.52 mg/dL — ABNORMAL HIGH (ref 0.50–1.35)
GFR calc Af Amer: 56 mL/min — ABNORMAL LOW (ref >90)
GFR calc non Af Amer: 48 mL/min — ABNORMAL LOW (ref >90)
Glucose, Bld: 105 mg/dL — ABNORMAL HIGH (ref 70–99)

## 2011-12-11 LAB — DIFFERENTIAL
Basophils Absolute: 0 10*3/uL (ref 0.0–0.1)
Eosinophils Relative: 2 % (ref 0–5)
Lymphocytes Relative: 18 % (ref 12–46)
Neutro Abs: 5.9 10*3/uL (ref 1.7–7.7)

## 2011-12-11 LAB — CBC
HCT: 46.7 % (ref 39.0–52.0)
Hemoglobin: 15.7 g/dL (ref 13.0–17.0)
MCH: 28.1 pg (ref 26.0–34.0)
MCV: 83.7 fL (ref 78.0–100.0)
RBC: 5.58 MIL/uL (ref 4.22–5.81)

## 2011-12-11 LAB — GLUCOSE, CAPILLARY: Glucose-Capillary: 107 mg/dL — ABNORMAL HIGH (ref 70–99)

## 2011-12-11 MED ORDER — BIOTENE DRY MOUTH MT LIQD
15.0000 mL | Freq: Two times a day (BID) | OROMUCOSAL | Status: DC
  Administered 2011-12-11 – 2012-01-04 (×44): 15 mL via OROMUCOSAL

## 2011-12-11 NOTE — Progress Notes (Signed)
Occupational Therapy Session Note  Patient Details  Name: Craig [name] MRN: [id number] Date of Birth: [birth year]  Today's Date: 12/11/2011 Time: 7829-5621 and 3086-5784 Time Calculation (min): 60 min and 30 min  Short Term Goals: Week 1:  OT Short Term Goal 1 (Week 1): Pt will complete LB bathing with mod assist at sit to stand level OT Short Term Goal 2 (Week 1): Pt will complete UB dressing with min assist OT Short Term Goal 3 (Week 1): Pt will complete LB dressing with mod assist at sit to stand level OT Short Term Goal 4 (Week 1): Pt will complete stand pivot transfer with min assist and min verbal cues  Skilled Therapeutic Interventions/Progress Updates:  1) OT eval, ADL assessment with focus on bed mobility, stand pivot transfers, and sit <> stand with bathing and dressing.  Pt required cues to attend to Lt side of body and obtain items on Lt side.  Pt demonstrated inattention to LLE and required cues for placement and stepping.  Pt demonstrated Lt lateral and posterior lean in standing, unable to correct with visual and tactile cues.  Pt reports "weaves" in standing -- demonstrating truncal ataxia.  LUE ataxia and decreased proprioception with self-care tasks.  2) 1:1 OT with focus on NM re-ed in sitting edge of mat with focus on reaching in various planes to challenge balance reactions, forced use of LUE with cues to visually track arm with movements to attempt to decrease ataxic movements.  Pt requires max assist stand pivot and squat pivot transfers secondary to increased Lt lateral and posterior lean and Lt decreased proprioception.  Therapy Documentation Precautions:  Precautions Precautions: Fall Precaution Comments: significant visual deficit to Lt with Lt side inattention Restrictions Weight Bearing Restrictions: No General: General Chart Reviewed: Yes Family/Caregiver Present: No Vital Signs: Therapy Vitals Temp: 99 F (37.2 C) Temp src: Oral Pulse Rate: 69  Resp:  17  BP: 111/68 mmHg Patient Position, if appropriate: Lying Oxygen Therapy SpO2: 98 % O2 Device: None (Room air) Pain: Pain Assessment Pain Assessment: No/denies pain ADL: ADL Grooming: Moderate assistance Where Assessed-Grooming: Sitting at sink Upper Body Bathing: Minimal assistance Where Assessed-Upper Body Bathing: Sitting at sink Lower Body Bathing: Maximal assistance Where Assessed-Lower Body Bathing: Sitting at sink;Standing at sink Upper Body Dressing: Moderate assistance Where Assessed-Upper Body Dressing: Sitting at sink Lower Body Dressing: Dependent Where Assessed-Lower Body Dressing: Sitting at sink;Standing at sink  See FIM for current functional status  Therapy/Group: Individual Therapy  [name] 12/11/2011, 9:52 AM

## 2011-12-11 NOTE — Evaluation (Signed)
Physical Therapy Assessment and Plan  Patient Details  Name: Craig [name] MRN: [id number] Date of Birth: 07-11-1951  PT Diagnosis: Abnormal posture, Abnormality of gait, Ataxia, Ataxic gait, Cognitive deficits, Coordination disorder, Dizziness and giddiness, Hemiplegia non-dominant and Impaired sensation Rehab Potential: Good ELOS: 3-4 weeks   Today's Date: 12/11/2011 Time: 1101-1200 Time Calculation (min): 59 min  Problem List:  Patient Active Problem List  Diagnoses  . Hypertension, accelerated  . CVA (cerebral infarction)  . Stroke    Past Medical History:  Past Medical History  Diagnosis Date  . Hypertension   . Allergy     RHINITIS  . Diverticulosis   . Colonic polyp    Past Surgical History: No past surgical history on file.  Assessment & Plan Clinical Impression: Patient is a 61 year old right-handed African American male with history of hypertension admitted April 4 with left-sided weakness and facial droop with dizziness as well as episode of nausea vomiting. 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 MRA of the head with diffuse intracranial atherosclerotic disease as well as occlusion of right posterior cerebral artery and occlusion of the left superior cerebellar artery compatible with areas of acute infarct. Echocardiogram showed ejection fraction of 65% and grade 1 diastolic dysfunction without emboli . Carotid Dopplers with no significant ICA stenosis. Neurology consulted placed on aspirin plus Plavix x 3 months followed by aspirin alone.  Patient transferred to CIR on 12/10/2011 .   Patient currently requires total with mobility secondary to muscle weakness, impaired timing and sequencing, motor apraxia, ataxia, decreased coordination and decreased motor planning, field cut, decreased midline orientation and decreased attention to left, decreased attention, decreased awareness,  decreased problem solving and decreased safety awareness, dizziness, nausea and decreased sitting balance, decreased standing balance, decreased postural control and decreased balance strategies.  Prior to hospitalization, patient was independent with mobility, worked as a Surveyor, mining and lived with Spouse;Son in a House home.  Home access is 1 to stoop, 1 into houseStairs to enter.  Patient will benefit from skilled PT intervention to maximize safe functional mobility, minimize fall risk and decrease caregiver burden for planned discharge home with 24 hour supervision.  Anticipate patient will benefit from follow up HH at discharge.  PT - End of Session Activity Tolerance: Tolerates 30+ min activity without fatigue Endurance Deficit: No PT Assessment Rehab Potential: Good PT Plan PT Frequency: 1-2 X/day, 60-90 minutes;5 out of 7 days Estimated Length of Stay: 3-4 weeks PT Treatment/Interventions: Ambulation/gait training;Balance/vestibular training;Cognitive remediation/compensation;Discharge planning;DME/adaptive equipment instruction;Functional electrical stimulation;Functional mobility training;Neuromuscular re-education;Patient/family education;Splinting/orthotics;Stair training;Therapeutic Activities;Therapeutic Exercise;UE/LE Strength taining/ROM;UE/LE Coordination activities;Visual/perceptual remediation/compensation;Wheelchair propulsion/positioning PT Recommendation Follow Up Recommendations: Home health PT;24 hour supervision/assistance Equipment Recommended: Rolling walker with 5" wheels;Wheelchair (measurements);Wheelchair cushion (measurements)  PT Evaluation Precautions/Restrictions Precautions Precautions: Fall Precaution Comments: significant visual deficit to Lt with Lt side inattention  Restrictions Weight Bearing Restrictions: No Pain Pain Assessment Pain Assessment: No/denies pain Pain Score: 0-No pain Home Living/Prior Functioning Home Living Lives With:  Spouse;Son [place] Help From: Family Type of Home: House Home Layout: One level Home Access: Stairs to enter Entrance Stairs-Rails: None Entrance Stairs-Number of Steps: 1 to stoop, 1 into house Home Adaptive Equipment: None Prior Function Level of Independence: Independent with gait;Independent with transfers Able to Take Stairs?: Yes Driving: Yes Vision/Perception  Vision - History Baseline Vision: No visual deficits Patient Visual Report:  (L visual field cut) Perception Perception: Impaired Inattention/Neglect: Does not attend to left  visual field;Does not attend to left side of body Praxis Praxis: Impaired Praxis Impairment Details: Motor planning  Cognition Overall Cognitive Status: Impaired Arousal/Alertness: Awake/alert Orientation Level: Oriented X4 Attention: Sustained;Selective Sustained Attention: Impaired Sustained Attention Impairment: Functional basic Selective Attention: Impaired Selective Attention Impairment: Verbal basic Memory: Impaired Memory Impairment: Decreased recall of new information;Decreased short term memory;Storage deficit;Retrieval deficit Decreased Short Term Memory: Functional basic Awareness: Impaired Awareness Impairment: Emergent impairment Problem Solving: Impaired Problem Solving Impairment: Functional basic Safety/Judgment: Impaired Sensation Sensation Light Touch: Impaired Detail Light Touch Impaired Details: Absent LUE;Absent LLE Proprioception: Impaired Detail Proprioception Impaired Details: Impaired LLE;Impaired LUE Coordination Gross Motor Movements are Fluid and Coordinated: No Coordination and Movement Description: Truncal, LUE and LE ataxia, impaired coordination, dysmetria  Motor  Motor Motor: Hemiplegia;Ataxia;Motor apraxia;Abnormal postural alignment and control Motor - Skilled Clinical Observations: LUE flexor tone/synergy? during active movement of LLE, L hemiplegia, LUE/LE ataxia, sits and stands with  significant L lateral lean  Mobility Transfers Stand Pivot Transfers: 1: +1 Total assist;With armrests Stand Pivot Transfer Details: Tactile cues for sequencing;Tactile cues for weight shifting;Tactile cues for posture;Tactile cues for placement;Visual cues/gestures for sequencing;Verbal cues for sequencing;Verbal cues for technique;Manual facilitation for weight shifting;Manual facilitation for placement Stand Pivot Transfer Details (indicate cue type and reason): When cued to transfer from recliner > w/c patient stood with LLE extended in front of him, L lean and unable to shift weight to R side; transfer training initiated with focus on verbal, visual and tactile cues for L foot placement, upright trunk posture and anterior lean, lateral weight shift and full pivot of COG once in squat position-max to total A Locomotion  Ambulation Ambulation: Yes Ambulation/Gait Assistance: 1: +2 Total assist Ambulation Distance (Feet): 10 Feet Assistive device: 2 person hand held assist Ambulation/Gait Assistance Details: Manual facilitation for weight shifting;Verbal cues for gait pattern;Verbal cues for sequencing;Visual cues/gestures for sequencing;Tactile cues for initiation Ambulation/Gait Assistance Details (indicate cue type and reason): +2A secondary to significant L lean and UE flexor tone/synergy, LLE scissoring, decreased extension LLE and decreased R lateral weight shift.  Attempted gait training with +2A with focus on sliding L foot forward to maintain input through LLE Stairs / Additional Locomotion Stairs: Yes Stairs Assistance: 1: +2 Total assist Stairs Assistance Details: Tactile cues for initiation;Tactile cues for sequencing;Tactile cues for weight shifting;Tactile cues for posture;Tactile cues for placement;Visual cues/gestures for sequencing;Verbal cues for sequencing;Verbal cues for precautions/safety Stairs Assistance Details (indicate cue type and reason): +2A with verbal, visual and  tactile cues for sequencing, L foot placement on next step, to maintain LUE on rail, LLE extension, R lateral weight shifting and turning.   Stair Management Technique: Two rails;Step to pattern;Forwards Number of Stairs: 3  Height of Stairs: 4  Wheelchair Mobility Wheelchair Mobility: Yes Wheelchair Assistance: 1: +1 Total assist Wheelchair Assistance Details: Tactile cues for initiation;Tactile cues for sequencing;Tactile cues for placement;Visual cues for safe use of DME/AE;Visual cues/gestures for precautions/safety;Visual cues/gestures for sequencing;Verbal cues for sequencing;Verbal cues for technique;Verbal cues for precautions/safety;Verbal cues for safe use of DME/AE Wheelchair Propulsion: Right lower extremity;Right upper extremity;Both lower extermities Wheelchair Parts Management: Needs assistance Distance: 5; attempted use of R hemi technique but patient continued to turn L into wall with little awareness or insight of how to correct; changed to bilat foot propulsion with total A to cue LLE to extend and flex   Trunk/Postural Assessment  Postural Control Postural Control: Deficits on evaluation Postural Limitations: Truncal drift to Lt and posteriorly in sitting with over compensations when given  cues to correct to midline  Balance Static Sitting Balance Static Sitting - Balance Support: Right upper extremity supported;Feet supported Static Sitting - Level of Assistance: 5: Stand by assistance Dynamic Sitting Balance Dynamic Sitting - Balance Support: Bilateral upper extremity supported;Feet supported;During functional activity Dynamic Sitting - Level of Assistance: 2: Max Oncologist Standing - Balance Support: Bilateral upper extremity supported Static Standing - Level of Assistance: 1: +2 Total assist Dynamic Standing Balance Dynamic Standing - Balance Support: Bilateral upper extremity supported;During functional activity Dynamic Standing - Level  of Assistance: 1: +2 Total assist Extremity Assessment  RLE Assessment RLE Assessment: Within Functional Limits LLE Assessment LLE Assessment: Exceptions to [facility] LLE Strength LLE Overall Strength: Deficits LLE Overall Strength Comments: 4/5 but impaired timing, sequencing and difficulty maintaining activation in WB position; significant ataxia and inattention  See FIM for current functional status Refer to Care Plan for Long Term Goals  Recommendations for other services: None  Discharge Criteria: Patient will be discharged from PT if patient refuses treatment 3 consecutive times without medical reason, if treatment goals not met, if there is a change in medical status, if patient makes no progress towards goals or if patient is discharged from hospital.  The above assessment, treatment plan, treatment alternatives and goals were discussed and mutually agreed upon: by patient  Edman Circle Faucette 12/11/2011, 1:14 PM

## 2011-12-11 NOTE — Progress Notes (Signed)
Patient ID: Craig [name], male   DOB: 1951-05-05, 61 y.o.   MRN: [id number]  Subjective/Complaints: HPI: 61 year old right-handed African American male with history of hypertension admitted April 4 with left-sided weakness and facial droop with dizziness as well as episode of nausea vomiting. MRI of the brain showed acute right posterior cerebral artery infarction as well as acute infarct left superior cerebellar territory No new c/os Review of Systems  HENT: Positive for hearing loss.     Objective: Vital Signs: Blood pressure 111/68, pulse 69, temperature 99 F (37.2 C), temperature source Oral, resp. rate 17, height 5\' 10"  (1.778 m), weight 96.2 kg (212 lb 1.3 oz), SpO2 98.00%. No results found. Results for orders placed during the hospital encounter of 12/10/11 (from the past 72 hour(s))  CBC     Status: Normal   Collection Time   12/11/11  6:15 AM      Component Value Range Comment   WBC 9.0  4.0 - 10.5 (K/uL)    RBC 5.58  4.22 - 5.81 (MIL/uL)    Hemoglobin 15.7  13.0 - 17.0 (g/dL)    HCT 82.9  56.2 - 13.0 (%)    MCV 83.7  78.0 - 100.0 (fL)    MCH 28.1  26.0 - 34.0 (pg)    MCHC 33.6  30.0 - 36.0 (g/dL)    RDW 86.5  78.4 - 69.6 (%)    Platelets 278  150 - 400 (K/uL)   COMPREHENSIVE METABOLIC PANEL     Status: Abnormal   Collection Time   12/11/11  6:15 AM      Component Value Range Comment   Sodium 136  135 - 145 (mEq/L)    Potassium 3.5  3.5 - 5.1 (mEq/L)    Chloride 97  96 - 112 (mEq/L)    CO2 27  19 - 32 (mEq/L)    Glucose, Bld 105 (*) 70 - 99 (mg/dL)    BUN 28 (*) 6 - 23 (mg/dL)    Creatinine, Ser 2.95 (*) 0.50 - 1.35 (mg/dL)    Calcium 9.4  8.4 - 10.5 (mg/dL)    Total Protein 7.4  6.0 - 8.3 (g/dL)    Albumin 3.7  3.5 - 5.2 (g/dL)    AST 16  0 - 37 (U/L)    ALT 18  0 - 53 (U/L)    Alkaline Phosphatase 57  39 - 117 (U/L)    Total Bilirubin 0.8  0.3 - 1.2 (mg/dL)    GFR calc non Af Amer 48 (*) >90 (mL/min)    GFR calc Af Amer 56 (*) >90 (mL/min)   DIFFERENTIAL      Status: Abnormal   Collection Time   12/11/11  6:15 AM      Component Value Range Comment   Neutrophils Relative 65  43 - 77 (%)    Neutro Abs 5.9  1.7 - 7.7 (K/uL)    Lymphocytes Relative 18  12 - 46 (%)    Lymphs Abs 1.6  0.7 - 4.0 (K/uL)    Monocytes Relative 16 (*) 3 - 12 (%)    Monocytes Absolute 1.4 (*) 0.1 - 1.0 (K/uL)    Eosinophils Relative 2  0 - 5 (%)    Eosinophils Absolute 0.1  0.0 - 0.7 (K/uL)    Basophils Relative 0  0 - 1 (%)    Basophils Absolute 0.0  0.0 - 0.1 (K/uL)     Physical Exam  Nursing note and vitals reviewed.  Constitutional: He is oriented  to person, place, and time. He appears well-developed.  HENT: Oral mucosa pink and moist.  Head: Normocephalic and atraumatic.  Eyes: Conjunctivae are normal. Pupils are equal, round, and reactive to light.  Neck: Normal range of motion. Neck supple. No thyromegaly present.  Cardiovascular: Normal rate. Regular rhythm without murmurs rubs or gallops.  Pulmonary/Chest: Effort normal and breath sounds normal. He has no wheezes, rales, rhonchi.  Abdominal: He exhibits no distension. There is no tenderness. Bowel sounds are positive  Musculoskeletal: He exhibits no edema.  Neurological: He is alert and oriented to person, place, and time.  Left visual field cut. His gaze seemed to be slightly disconjugate but he denied any diplopia. He did have a few beats of nystagmus with right and left gaze as well as vertical/ upward gaze. Patient with mild apraxia and decreased fine motor skills. Left arm and leg are quite ataxic.  He is left hemisensory loss at 1 out of 2. He has diminished attention to the left side as well today. Cognitively he is quite appropriate and pleasant. He has excellent insight and awareness. Memory is intact.  Skin: Skin is warm and dry.      Assessment/Plan: 1. Functional deficits secondary to L cerebellar infarct which require 3+ hours per day of interdisciplinary therapy in a comprehensive inpatient  rehab setting. Physiatrist is providing close team supervision and 24 hour management of active medical problems listed below. Physiatrist and rehab team continue to assess barriers to discharge/monitor patient progress toward functional and medical goals. FIM:                      Expression Expression Mode: Verbal Expression: 5-Expresses basic needs/ideas: With no assist  Social Interaction Social Interaction: 5-Interacts appropriately 90% of the time - Needs monitoring or encouragement for participation or interaction.  Problem Solving Problem Solving: 3-Solves basic 50 - 74% of the time/requires cueing 25 - 49% of the time  Memory Memory Mode: Asleep   Medical Problem List and Plan:  1. 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  2.. DVT Prophylaxis/Anticoagulation: SCDs and ambulation. Consider screening Dopplers  3. Hypertension. Norvasc 10 mg daily, hydrochlorothiazide 25 mg daily, Avapro 300 mg daily, bystolic 10 mg daily. Monitor with increased activity. Review secondary stroke prevention with patient.  4. Elevated hemoglobin A1c: Sugars at this point are essentially within normal limits. Impressed the importance of appropriate diet  5. Mood: Social worker to perform depression screen. Team to provide ego support as appropriate. Patient seems fairly upbeat at this time.   LOS (Days) 1 A FACE TO FACE EVALUATION WAS PERFORMED  Sotirios Navarro E 12/11/2011, 7:58 AM

## 2011-12-11 NOTE — Progress Notes (Signed)
Social Work Assessment and Plan Social Work Assessment and Plan  Patient Details  Name: Craig [name] MRN: [id number] Date of Birth: 1951-01-27  Today's Date: 12/11/2011  Problem List:  Patient Active Problem List  Diagnoses  . Hypertension, accelerated  . CVA (cerebral infarction)  . Stroke   Past Medical History:  Past Medical History  Diagnosis Date  . Hypertension   . Allergy     RHINITIS  . Diverticulosis   . Colonic polyp    Past Surgical History: No past surgical history on file. Social History:  reports that he has never smoked. He has never used smokeless tobacco. He reports that he does not drink alcohol or use illicit drugs.  Family / Support Systems Marital Status: Married Patient Roles: Spouse;Parent;Other (Comment) (Employee) Spouse/Significant Other: [name] [phone] Children: Son attends [employer] graduating in May Other Supports: Extended family and friends Anticipated Caregiver: Wife and son Ability/Limitations of Caregiver: Wife is currently on FMLA begun since this hospitalization, son graduates in May Caregiver Availability: 24/7 Family Dynamics: Close knit family, been through this beofre with wife's sister-2 years ago.  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  He  is ready to work hard and get his independence back.  He has been healthy throughout his life and feels blessed. Recent Psychosocial Issues: HTN and needs to eat better Pyschiatric History: No history- Beck Depression Screen score 2.  He feels positive and feels he will recover from this stroke.  Will continue to monitor his coping while here.  May benefit from peer support person while here. Substance Abuse History: No issues  Patient / Family Perceptions, Expectations & Goals Pt/Family understanding of illness & functional limitations: Pt and wife are able to explain his stroke and deficits.  He has made some progress but feels he has a ways to go.  He is glad to be here on the rehab unit. Premorbid pt/family roles/activities: Husband, Father, Employee, Church member, etd Anticipated changes in roles/activities/participation: Plans to resume Pt/family expectations/goals: Pt states: " I want to be able to take care of myself and regain my independence."  Wife reports: " I want him to recover and make as much progress as possible while here."  [employer]: None Premorbid Home Care/DME Agencies: None Transportation available at discharge: 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  Other fmaily members may also  be able to help.  Wife has taken a FMLA to assist pt at discharge Social Work Anticipated Follow Up Needs: HH/OP;Support Group  Clinical  Impression Pleasant gentleman eating breakfast and discussing the progress he has already made.  He feels grateful to be here on rehab.  Supportive wife and family. May benefit from peer support while here, there is another gentleman similar to pt will ask permission to get them together.  Lucy Chris 12/11/2011, 9:39 AM

## 2011-12-11 NOTE — Progress Notes (Signed)
Patient tolerating therapies well. No complaints today. L eye blindness and L neglect. LUE very weak and takes extra time to process to move extremity per patient. Continue plan of care

## 2011-12-11 NOTE — Evaluation (Signed)
Occupational Therapy Assessment and Plan  Patient Details  Name: Craig [name] MRN: [id number] Date of Birth: 03-11-1951  OT Diagnosis: abnormal posture, apraxia, ataxia, disturbance of vision, hemiplegia affecting non-dominant side and muscle weakness (generalized) Rehab Potential: Rehab Potential: Good ELOS: 3-4 weeks   Today's Date: 12/11/2011 Time: 4098-1191    Problem List:  Patient Active Problem List  Diagnoses  . Hypertension, accelerated  . CVA (cerebral infarction)  . Stroke    Past Medical History:  Past Medical History  Diagnosis Date  . Hypertension   . Allergy     RHINITIS  . Diverticulosis   . Colonic polyp    Past Surgical History: No past surgical history on file.  Assessment & Plan Clinical Impression: Patient is a 61 y.o. year old male with recent admission to the hospital on April 4 with left-sided weakness and facial droop with dizziness as well as episode of nausea vomiting. 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 MRA of the head with diffuse intracranial atherosclerotic disease as well as occlusion of right posterior cerebral artery and occlusion of the left superior cerebellar artery compatible with areas of acute infarct. Echocardiogram showed ejection fraction of 65% and grade 1 diastolic dysfunction without emboli . Carotid Dopplers with no significant ICA stenosis.   Patient transferred to CIR on 12/10/2011 .    Patient currently requires total with basic self-care skills secondary to motor apraxia, ataxia, decreased coordination and decreased motor planning, decreased visual perceptual skills and field cut, decreased midline orientation, decreased attention to left, left side neglect and decreased motor planning and decreased awareness and decreased problem solving.  Prior to hospitalization, patient could complete self-cares with independence.  Patient will  benefit from skilled intervention to decrease level of assist with basic self-care skills and increase independence with basic self-care skills prior to discharge home with care partner.  Anticipate patient will require 24 hour supervision and follow up home health and follow up outpatient.  OT - End of Session Activity Tolerance: Tolerates 30+ min activity without fatigue OT Assessment Rehab Potential: Good OT Plan OT Frequency: 1-2 X/day, 60-90 minutes Estimated Length of Stay: 3 weeks OT Treatment/Interventions: Balance/vestibular training;Cognitive remediation/compensation;Discharge planning;DME/adaptive equipment instruction;Functional mobility training;Neuromuscular re-education;Patient/family education;Self Care/advanced ADL retraining;Therapeutic Activities;Therapeutic Exercise;UE/LE Strength taining/ROM;UE/LE Coordination activities;Visual/perceptual remediation/compensation OT Recommendation Equipment Recommended: Tub/shower bench (TBD)  OT Evaluation Precautions/Restrictions  Precautions Precautions: Fall Precaution Comments: significant visual deficit to Lt with Lt side inattention Restrictions Weight Bearing Restrictions: No General Chart Reviewed: Yes Family/Caregiver Present: No Vital Signs Therapy Vitals Temp: 99 F (37.2 C) Temp src: Oral Pulse Rate: 69  Resp: 17  BP: 111/68 mmHg Patient Position, if appropriate: Lying Oxygen Therapy SpO2: 98 % O2 Device: None (Room air) Pain Pain Assessment Pain Assessment: No/denies pain Home Living/Prior Functioning Home Living Lives With: Spouse;Son Type of Home: House Home Layout: One level Home Access: Other (comment) (one small threshold) Bathroom Shower/Tub: Tub/shower unit;Curtain Firefighter: Standard Home Adaptive Equipment: None Additional Comments: wife able to use FMLA and son graduates from college in 4 weeks Prior Function Level of Independence: Independent with basic ADLs;Independent with  gait;Independent with transfers Able to Take Stairs?: Yes Driving: Yes 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 Leisure: Hobbies-yes (Comment) Comments: playing guitar and watching tv ADL ADL Grooming: Moderate assistance Where Assessed-Grooming: Sitting at sink Upper Body Bathing: Minimal assistance Where Assessed-Upper Body Bathing: Sitting at sink Lower Body Bathing:  Maximal assistance Where Assessed-Lower Body Bathing: Sitting at sink;Standing at sink Upper Body Dressing: Moderate assistance Where Assessed-Upper Body Dressing: Sitting at sink Lower Body Dressing: Dependent Where Assessed-Lower Body Dressing: Sitting at sink;Standing at sink Vision/Perception  Vision - History Baseline Vision: No visual deficits Patient Visual Report: Other (comment) (Lt visual deficits) Vision - Assessment Eye Alignment: Impaired (comment) Vision Assessment: Vision impaired - to be further tested in functional context Perception Inattention/Neglect: Does not attend to left visual field;Does not attend to left side of body Praxis Praxis: Impaired Praxis Impairment Details: Motor planning  Cognition   Sensation Sensation Light Touch: Impaired by gross assessment;Impaired Detail Light Touch Impaired Details: Impaired LUE;Impaired LLE Proprioception: Impaired Detail Proprioception Impaired Details: Impaired LUE;Impaired LLE Coordination Gross Motor Movements are Fluid and Coordinated: No Fine Motor Movements are Fluid and Coordinated: No Coordination and Movement Description: decreased coordination Lt upper and lower extremities, including dysmetria and gernal inattention to the position/function on the Lt side.  Noted truncal incoordination also. Finger Nose Finger Test: dysmetria.  Deficits on Lt side Motor    Mobility  Bed Mobility Bed Mobility: Yes Rolling Right: 4: Min assist Right Sidelying to Sit: 4: Min assist Sitting - Scoot to Edge  of Bed: 4: Min assist Transfers Transfers: Yes Sit to Stand: 3: Mod assist;With upper extremity assist;With armrests;From bed;From chair/3-in-1 Sit to Stand Details (indicate cue type and reason): Pt required vc for hand placement and sequencing of task.  Pt required cues for Lt side awareness.   Stand to Sit: 3: Mod assist  Trunk/Postural Assessment  Postural Control Postural Control: Deficits on evaluation Postural Limitations: truncal drift to Lt and posteriorly in sitting with over compensations when given cues to correct to midline.  Use of mirror helps somewhat  Balance   Extremity/Trunk Assessment RUE Assessment RUE Assessment: Within Functional Limits LUE Assessment LUE Assessment: Not tested (deficits noted in functional tasks, to be further assessed)  See FIM for current functional status Refer to Care Plan for Long Term Goals  Recommendations for other services: None  Discharge Criteria: Patient will be discharged from OT if patient refuses treatment 3 consecutive times without medical reason, if treatment goals not met, if there is a change in medical status, if patient makes no progress towards goals or if patient is discharged from hospital.  The above assessment, treatment plan, treatment alternatives and goals were discussed and mutually agreed upon: by patient  [name] 12/11/2011, 8:59 AM

## 2011-12-11 NOTE — Evaluation (Signed)
Speech Language Pathology Assessment and Plan & Bedside Swallow Evaluation  Patient Details  Name: Craig [name] MRN: [id number] Date of Birth: 1950-12-25  SLP Diagnosis: cognitive deficits Rehab Potential: Good ELOS:  3 weeks  Today's Date: 12/11/2011 Time: 1000-1100 Time Calculation (min): 60 min  Problem List:  Patient Active Problem List  Diagnoses  . Hypertension, accelerated  . CVA (cerebral infarction)  . Stroke    Past Medical History:  Past Medical History  Diagnosis Date  . Hypertension   . Allergy     RHINITIS  . Diverticulosis   . Colonic polyp    Past Surgical History: No past surgical history on file.  Assessment & Plan Clinical Impression: Patient is a 61 year old right-handed African American male with history of hypertension admitted April 4 with left-sided weakness and facial droop with dizziness as well as episode of nausea vomiting. 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 MRA of the head with diffuse intracranial atherosclerotic disease as well as occlusion of right posterior cerebral artery and occlusion of the left superior cerebellar artery compatible with areas of acute infarct. Patient transferred to CIR on 12/10/2011 and upon evaluation today presents with cognitive deficits characterized by left field cut, inattention, decreased recall, awareness, attention and problem solving which impacts his ability to complete BADLs.  As a result, this patient would benefit from skilled SLP services to maximize functional independence with familiar tasks and reduce burden of care upon discharge.    SLP - End of Session Patient left: in chair;with call bell in reach Nurse Communication: Cognitive/Linguistic strategies reviewed Assessment SLP Recommendation/Assessment: Patient will need skilled Speech Lanaguage Pathology Services during CIR admission Rehab Potential:  Good Therapy Diagnosis: Cognitive Impairments SLP Plan SLP Frequency: 1-2 X/day, 30-60 minutes;5 out of 7 days SLP Treatment/Interventions: Cognitive remediation/compensation;Cueing hierarchy;Environmental controls;Functional tasks;Internal/external aids;Patient/family education;Therapeutic Activities Recommendation Follow up Recommendations: Outpatient SLP Equipment Recommended: None recommended by SLP   SLP Evaluation Precautions/Restrictions  Precautions Precautions: Fall Precaution Comments: significant visual deficit to Lt with Lt side inattention Restrictions Weight Bearing Restrictions: No General    Pain Pain Assessment Pain Assessment: No/denies pain Pain Score: 0-No pain Prior Functioning Cognitive/Linguistic Baseline: Within functional limits Type of Home: House Lives With: Spouse;Son 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) Cognition Overall Cognitive Status: Impaired Arousal/Alertness: Awake/alert Orientation Level: Oriented X4 Attention: Sustained;Selective Sustained Attention: Impaired Sustained Attention Impairment: Functional basic Selective Attention: Impaired Selective Attention Impairment: Verbal basic Memory: Impaired Memory Impairment: Decreased recall of new information;Decreased short term memory;Storage deficit;Retrieval deficit Decreased Short Term Memory: Functional basic Awareness: Impaired Awareness Impairment: Emergent impairment Problem Solving: Impaired Problem Solving Impairment: Functional basic Safety/Judgment: Impaired Comprehension Auditory Comprehension Overall Auditory Comprehension: Appears within functional limits for tasks assessed Reading Comprehension Reading Status: Impaired Effective Techniques: Other (comment) (cues and strategies for scanning left) Expression Expression Primary Mode of Expression: Verbal Verbal Expression Overall Verbal Expression: Impaired Pragmatics: Impairment Impairments: Topic  maintenance;Turn Taking Interfering Components: Attention Effective Techniques: Semantic cues Other Verbal Expression Comments: word finding difficulty/organizatioal difficulty in conversation supervision-mod I Written Expression Dominant Hand: Right Written Expression: Exceptions to [facility] Self Formulation Ability: Word (not attending to 1/3 of the left side of the paper) Oral/Motor Oral Motor/Sensory Function Overall Oral Motor/Sensory Function: Appears within functional limits for tasks assessed Motor Speech Overall Motor Speech: Appears within functional limits for tasks assessed Motor Planning: Witnin functional limits  See FIM for current functional status Refer to Care Plan for  Long Term Goals  Recommendations for other services: None  Discharge Criteria: Patient will be discharged from SLP if patient refuses treatment 3 consecutive times without medical reason, if treatment goals not met, if there is a change in medical status, if patient makes no progress towards goals or if patient is discharged from hospital.  The above assessment, treatment plan, treatment alternatives and goals were discussed and mutually agreed upon: by patient   Clinical/Bedside Swallow Evaluation Patient Details  Name: Craig [name] MRN: [id number] DOB: 09/17/1950 Today's Date: 12/11/2011  Past Medical History:  Past Medical History  Diagnosis Date  . Hypertension   . Allergy     RHINITIS  . Diverticulosis   . Colonic polyp    Past Surgical History: No past surgical history on file. HPI:  see above   Assessment/Recommendations/Treatment Plan SLP Assessment Clinical Impression Statement: Patient presents with [facility] oral and pharyngeal phase of swallow with cognition impacting ability to locate itmes and problem solve during self feeding.  Recommend intermittent staff supervision due to cogntive deficts impact on safety during self feeding. Risk for Aspiration: None Other Related Risk Factors:  Cognitive impairment  Swallow Evaluation Recommendations Diet Recommendations: Regular;Thin liquid Liquid Administration via: Spoon;No straw Medication Administration: Whole meds with liquid Supervision: Patient able to self feed;Intermittent supervision to cue for compensatory strategies Compensations: Slow rate;Small sips/bites (cues to attend to left) Postural Changes and/or Swallow Maneuvers: Seated upright 90 degrees Oral Care Recommendations: Oral care BID Follow up Recommendations: Outpatient SLP  Treatment Plan Treatment Plan Recommendations: Therapy as outlined in treatment plan below Speech Therapy Frequency: min 5x/week Treatment Duration: 2 weeks Interventions: Aspiration precaution training;Patient/family education;Group dysphagia treatment;Compensatory techniques  Prognosis Barriers to Reach Goals: Cognitive deficits  Individuals Consulted Consulted and Agree with Results and Recommendations: Patient  Swallowing Goals  SLP Swallowing Goals Goal #3: Patient will attend to the left of enviornment with BADLs with mod assist sematnic and visual cues  Swallow Study Prior Functional Status  Type of Home: House Lives With: Spouse;Son 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)  General  Date of Onset: 12/06/11 HPI: see above Type of Study: Bedside swallow evaluation Diet Prior to this Study: Regular;Thin liquids Temperature Spikes Noted: No Respiratory Status: Room air History of Intubation: No Behavior/Cognition: Alert;Cooperative;Pleasant mood;Distractible;Requires cueing Oral Cavity - Dentition: Adequate natural dentition Vision: Functional for self-feeding Patient Positioning: Upright in chair Baseline Vocal Quality: Clear Volitional Cough: Strong Volitional Swallow: Able to elicit  Oral Motor/Sensory Function  Overall Oral Motor/Sensory Function: Appears within functional limits for tasks assessed  Consistency Results  Ice Chips Ice chips: Not  tested  Thin Liquid Presentation: Cup;Straw;Self Fed  Nectar Thick Liquid Nectar Thick Liquid: Not tested  Honey Thick Liquid Honey Thick Liquid: Not tested  Puree Puree: Not tested  Solid Solid: Within functional limits Presentation: Self Fed Other Comments: cues to attend to left of environment which impacted ability to self feed  Fae Pippin, M.A., CCC-SLP [phone]  Ilithyia Titzer 12/11/2011,11:28 AM

## 2011-12-11 NOTE — Plan of Care (Signed)
Overall Plan of Care [facility]) Patient Details Name: Craig [name] MRN: [id number] DOB: Dec 22, 1950  Diagnosis:  Rehabilitation for stroke  Primary Diagnosis:    Stroke Co-morbidities: Left hemiataxia, left homonymous hemianopsia, cognitive deficits including reduced sensory integration  Functional Problem List  Patient demonstrates impairments in the following areas: Balance, Cognition, Motor, Perception, Safety, Sensory  and Vision  Basic ADL's: grooming, bathing, dressing and toileting Advanced ADL's: Not applicable  Transfers:  bed mobility, bed to chair, toilet, tub/shower, car and floor Locomotion:  ambulation, wheelchair mobility and stairs  Additional Impairments:  Functional use of upper extremity  Anticipated Outcomes Item Anticipated Outcome  Eating/Swallowing    Basic self-care  supervision  Tolieting  supervision  Bowel/Bladder  Min A  Transfers  supervision  Locomotion  Min A ambulation, supervision w/c  Communication    Cognition    Pain  <2  Safety/Judgment    Other     Therapy Plan: PT Frequency: 1-2 X/day, 60-90 minutes;5 out of 7 days OT Frequency: 1-2 X/day, 60-90 minutes SLP Frequency: 1-2 X/day, 30-60 minutes;5 out of 7 days   Team Interventions: Item RN PT OT SLP SW TR Other  Self Care/Advanced ADL Retraining   x      Neuromuscular Re-Education  x x      Therapeutic Activities  x x      UE/LE Strength Training/ROM  x x      UE/LE Coordination Activities  x x      Visual/Perceptual Remediation/Compensation  x x      DME/Adaptive Equipment Instruction  x x      Therapeutic Exercise  x x      Balance/Vestibular Training  x x      Patient/Family Education x x x      Cognitive Remediation/Compensation  x x      Functional Mobility Training  x x      Ambulation/Gait Training  x       Stair Training  x       Wheelchair Propulsion/Positioning  x       Functional Education officer, environmental         Bladder Management x        Bowel Management x        Disease Management/Prevention         Pain Management         Medication Management         Skin Care/Wound Management x        Splinting/Orthotics  x       Discharge Planning x x x  x    Psychosocial Support x  x  x                       Team Discharge Planning: Destination:  Home Projected Follow-up:  PT, OT and Home Health Projected Equipment Needs:  Biomedical engineer involved in discharge planning:  Yes  MD ELOS: 3-4 weeks Medical Rehab Prognosis:  Good Assessment: 61 year old male admitted for right PCA and left cerebellar CVA is now requiring 24 7 rehabilitation RN and M.D. as well as CIR level PT, OT, speech therapy

## 2011-12-11 NOTE — Progress Notes (Signed)
Dicussed with patient and wife about safety plan, verbalizes understanding and fall safety video viewed by both.

## 2011-12-11 NOTE — Progress Notes (Addendum)
Inpatient Rehabilitation Center Individual Statement of Services  Patient Name:  Craig [name]  Date:  12/11/2011  Welcome to the [facility]patient [facility].  Our goal is to provide you with an individualized program based on your diagnosis and situation, designed to meet your specific needs.  With this comprehensive rehabilitation program, you will be expected to participate in at least 3 hours of rehabilitation therapies Monday-Friday, with modified therapy programming on the weekends.  Your rehabilitation program will include the following services:  Physical Therapy (PT), Occupational Therapy (OT), Speech Therapy (ST), 24 hour per day rehabilitation nursing, Therapeutic Recreaction (TR), Case Management (RN and Child psychotherapist), Rehabilitation Medicine, Nutrition Services and Pharmacy Services  Weekly team conferences will be held on  Wednesday  to discuss your progress.  Your RN Case Designer, television/film set will talk with you frequently to get your input and to update you on team discussions.  Team conferences with you and your family in attendance may also be held.  Expected length of stay: about 3 weeks Overall anticipated outcome: Supervision-Min Assist  Depending on your progress and recovery, your program may change.  Your RN Case Estate agent will coordinate services and will keep you informed of any changes.  Your RN Sports coach and SW names and contact numbers are listed  below.  The following services may also be recommended but are not provided by the Inpatient Rehabilitation Center:   Driving Evaluations  Home Health Rehabiltiation Services  Outpatient Rehabilitatation [facility]  Vocational Rehabilitation   Arrangements will be made to provide these services after discharge if needed.  Arrangements include referral to agencies that provide these services.  Your insurance has been verified to be:  [employer] Your primary doctor is:  Dr. Sharlot Gowda  Pertinent information will be shared with your doctor and your insurance company.  Case Manager: Lutricia Horsfall, [place] [facility] [phone]  Social Worker:  Dossie Der, [place] [phone]  Information discussed with and copy given to patient by: Brock Ra, 12/11/2011, 11:22 AM

## 2011-12-11 NOTE — H&P (Signed)
Physical Medicine and Rehabilitation Admission H&P  Craig [name] is an 61 y.o. male.   Chief Complaint  Patient presents with  . Cerebrovascular Accident  : HPI: 61-year-old right-handed African American male with history of hypertension admitted April 4 with left-sided weakness and facial droop with dizziness as well as episode of nausea vomiting. 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 MRA of the head with diffuse intracranial atherosclerotic disease as well as occlusion of right posterior cerebral artery and occlusion of the left superior cerebellar artery compatible with areas of acute infarct. Echocardiogram showed ejection fraction of 65% and grade 1 diastolic dysfunction without emboli . Carotid Dopplers with no significant ICA stenosis. Neurology consulted placed on aspirin plus Plavix x3 months followed by aspirin alone. Physical occupational therapy evaluations are pending.Mildly elevated HGB A1C 6.4 and blood sugars discontinued after showing good control. M.D. request physical medicine rehabilitation consult consider inpatient rehabilitation services. Rehabilitation felt this patient could benefit from an inpatient rehabilitation stay to maximize functional abilities.  Review of Systems  Gastrointestinal: Positive for nausea and vomiting.  Neurological: Positive for dizziness.  All other systems reviewed and are negative   Past Medical History  Diagnosis Date  . Hypertension   . Allergy     RHINITIS  . Diverticulosis   . Colonic polyp    History reviewed. No pertinent past surgical history. Family History  Problem Relation Age of Onset  . Cancer Mother   . Arthritis Mother   . Heart disease Mother   . Cancer Father   . Cancer Sister    Social History:  reports that he has never smoked. He has never used smokeless tobacco. He reports that he does not drink alcohol or use illicit  drugs. Allergies: No Known Allergies Medications Prior to Admission  Medication Dose Route Frequency Provider Last Rate Last Dose  .  stroke: mapping our early stages of recovery book   Does not apply Once [name], MD      . acetaminophen (TYLENOL) tablet 650 mg  650 mg Oral Q4H PRN Daniel V Thompson, MD   650 mg at 12/07/11 0215  . amLODipine (NORVASC) tablet 10 mg  10 mg Oral Daily Arshad N Kakrakandy, MD   10 mg at 12/09/11 1021  . amLODipine (NORVASC) tablet 10 mg  10 mg Oral Once [name], MD      . aspirin suppository 300 mg  300 mg Rectal Daily Arshad N Kakrakandy, MD       Or  . aspirin tablet 325 mg  325 mg Oral Daily Arshad N Kakrakandy, MD   325 mg at 12/09/11 1021  . clopidogrel (PLAVIX) tablet 75 mg  75 mg Oral Q breakfast Pramod S Sethi, MD   75 mg at 12/10/11 0822  . hydrALAZINE (APRESOLINE) injection 10 mg  10 mg Intravenous Q6H PRN Mary A Lynch, NP   10 mg at 12/08/11 2234  . hydrochlorothiazide (HYDRODIURIL) tablet 25 mg  25 mg Oral Daily Tara C Jernejcic, PA   25 mg at 12/09/11 1021  . irbesartan (AVAPRO) tablet 300 mg  300 mg Oral Daily Arshad N Kakrakandy, MD   300 mg at 12/09/11 1022  . nebivolol (BYSTOLIC) tablet 10 mg  10 mg Oral Daily Tara C Jernejcic, PA   10 mg at 12/09/11 1022  . ondansetron (ZOFRAN) injection 4 mg  4 mg Intravenous Once David Yelverton, MD   4 mg at   12/06/11 1959  . ondansetron (ZOFRAN) injection 4 mg  4 mg Intravenous Q6H PRN Daniel V Thompson, MD      . senna-docusate (Senokot-S) tablet 1 tablet  1 tablet Oral QHS PRN Arshad N Kakrakandy, MD      . sodium chloride 0.9 % bolus 500 mL  500 mL Intravenous Once David Yelverton, MD   500 mL at 12/06/11 1958  . DISCONTD: 0.9 %  sodium chloride infusion   Intravenous Continuous Arshad N Kakrakandy, MD 100 mL/hr at 12/07/11 0015     Medications Prior to Admission  Medication Sig Dispense Refill  . Olmesartan-Amlodipine-HCTZ 40-10-12.5 MG TABS Take 1 tablet by mouth daily.        Home: Home  Living Lives With: Spouse;Son Type of Home: House Home Layout: One level Home Access:  (one small threshold) Bathroom Shower/Tub: Tub/shower unit;Curtain Bathroom Toilet: Standard Home Adaptive Equipment: None Additional Comments: wife able to use FMLA and son graduates from college in 4 weeks   Functional History: Prior Function Level of Independence: Independent with basic ADLs;Independent with gait;Independent with transfers Able to Take Stairs?: Yes Driving: Yes Vocation: Full time employment Leisure: Hobbies-yes (Comment) Comments:   playing a guitar , watching tv   Functional Status:  Mobility: Bed Mobility Bed Mobility: Yes Rolling Right: 4: Min assist Rolling Right Details (indicate cue type and reason):   vc's to find and bring his L UE right; assist left LE off the bed. pt unaware that the leg was stuck at EOB  Right Sidelying to Sit: 4: Min assist Right Sidelying to Sit Details (indicate cue type and reason): vc's to activate BIL LE  Sitting - Scoot to Edge of Bed: 3: Mod assist Sitting - Scoot to Edge of Bed Details (indicate cue type and reason): vc's for w/shift along with w/shift assist and assymmetrical scooting assist. Sit to Sidelying Right: 1: +2 Total assist;Patient percentage (comment);HOB flat;Other (comment) Transfers Transfers: Yes Sit to Stand: 1: +2 Total assist;Patient percentage (comment);With upper extremity assist;From bed;Other (comment) (pt 60%) Sit to Stand Details (indicate cue type and reason): v/c for hand placement sequencing and (A) for core stability and forward translation for upright posture Stand to Sit: 1: +2 Total assist;Patient percentage (comment);To bed Ambulation/Gait Ambulation/Gait: No Stairs: No Wheelchair Mobility Wheelchair Mobility: No  ADL: ADL Eating/Feeding: Performed;Other (comment) (Min Guard (A)) Where Assessed - Eating/Feeding: Edge of bed (pt educated compensatory strategy for lunch) Upper Body Dressing:  Simulated;Moderate assistance Where Assessed - Upper Body Dressing: Sitting, bed;Unsupported Lower Body Dressing: Simulated;+1 Total assistance Where Assessed - Lower Body Dressing: Sitting, bed;Unsupported Toilet Transfer: Simulated;+2 Total assistance;Comment for patient % (pt 60%) Ambulation Related to ADLs: A few short steps taken at bedside no ambulation in room at this time. ADL Comments: Pt demonstrates motor planning deficits on Lt UE / LE and proprioception deficits. Pt demonstrates decreased sensation in Lt UE / LE. Pt provided weight bearing over Lt Ue and LE then demonstrating increased motor control during session.   Cognition: Cognition Overall Cognitive Status: Impaired Arousal/Alertness: Awake/alert Orientation Level: Disoriented to place Attention:  (left inattention) Memory: Impaired Memory Impairment: Decreased short term memory;Decreased recall of new information;Retrieval deficit;Storage deficit Decreased Short Term Memory: Verbal basic Awareness: Impaired Awareness Impairment: Intellectual impairment;Emergent impairment;Anticipatory impairment Problem Solving: Impaired Problem Solving Impairment: Functional complex;Verbal complex Safety/Judgment: Impaired Cognition Arousal/Alertness: Awake/alert Overall Cognitive Status: Appears within functional limits for tasks assessed Orientation Level: Disoriented to place   Blood pressure 147/97, pulse 77, temperature 99.4 F (37.4 C), temperature   source Oral, resp. rate 20, height 5' 10" (1.778 m), weight 109.317 kg (241 lb), SpO2 97.00%. Physical Exam  Nursing note and vitals reviewed.  Constitutional: He is oriented to person, place, and time. He appears well-developed.  HENT: Oral mucosa pink and moist. Head: Normocephalic and atraumatic.  Eyes: Conjunctivae are normal. Pupils are equal, round, and reactive to light.  Neck: Normal range of motion. Neck supple. No thyromegaly present.  Cardiovascular: Normal rate.  Regular rhythm without murmurs rubs or gallops. Pulmonary/Chest: Effort normal and breath sounds normal. He has no wheezes, rales, rhonchi.  Abdominal: He exhibits no distension. There is no tenderness. Bowel sounds are positive  Musculoskeletal: He exhibits no edema.  Neurological: He is alert and oriented to person, place, and time.  Left visual field cut. His gaze seemed to be slightly disconjugate but he denied any diplopia. He did have a few beats of nystagmus with right and left gaze as well as vertical/ upward gaze. Patient with mild apraxia and decreased fine motor skills. Left arm and leg are quite ataxic. He also has mild limb ataxia the right side. He is left hemisensory loss at 1 out of 2. He has diminished attention to the left side as well today. Cognitively he is quite appropriate and pleasant. He has excellent insight and awareness. Memory is intact. Skin: Skin is warm and dry.  Psychiatric: He has a normal mood and affect. His behavior is normal. Judgment and thought content normal   Results for orders placed during the hospital encounter of 12/06/11 (from the past 48 hour(s))  GLUCOSE, CAPILLARY     Status: Abnormal   Collection Time   12/08/11 11:18 AM      Component Value Range Comment   Glucose-Capillary 113 (*) 70 - 99 (mg/dL)   GLUCOSE, CAPILLARY     Status: Abnormal   Collection Time   12/08/11  4:25 PM      Component Value Range Comment   Glucose-Capillary 111 (*) 70 - 99 (mg/dL)   GLUCOSE, CAPILLARY     Status: Abnormal   Collection Time   12/08/11  9:23 PM      Component Value Range Comment   Glucose-Capillary 107 (*) 70 - 99 (mg/dL)    Comment 1 Documented in Chart      Comment 2 Notify RN     GLUCOSE, CAPILLARY     Status: Abnormal   Collection Time   12/09/11  7:15 AM      Component Value Range Comment   Glucose-Capillary 117 (*) 70 - 99 (mg/dL)    Comment 1 Notify RN      Comment 2 Documented in Chart     GLUCOSE, CAPILLARY     Status: Abnormal   Collection  Time   12/09/11 11:28 AM      Component Value Range Comment   Glucose-Capillary 149 (*) 70 - 99 (mg/dL)   GLUCOSE, CAPILLARY     Status: Abnormal   Collection Time   12/09/11  4:10 PM      Component Value Range Comment   Glucose-Capillary 114 (*) 70 - 99 (mg/dL)   GLUCOSE, CAPILLARY     Status: Abnormal   Collection Time   12/09/11  9:48 PM      Component Value Range Comment   Glucose-Capillary 120 (*) 70 - 99 (mg/dL)   GLUCOSE, CAPILLARY     Status: Abnormal   Collection Time   12/10/11  6:41 AM      Component Value Range Comment     Glucose-Capillary 109 (*) 70 - 99 (mg/dL)    No results found.  Post Admission Physician Evaluation: 1. Functional deficits secondary  to right posterior cerebral artery infarct and left superior cerebellar artery infarct 2. Patient is admitted to receive collaborative, interdisciplinary care between the physiatrist, rehab nursing staff, and therapy team. 3. Patient's level of medical complexity and substantial therapy needs in context of that medical necessity cannot be provided at a lesser intensity of care such as a SNF. 4. Patient has experienced substantial functional loss from his/her baseline which was documented above under the "Functional History" and "Functional Status" headings.  Judging by the patient's diagnosis, physical exam, and functional history, the patient has potential for functional progress which will result in measurable gains while on inpatient rehab.  These gains will be of substantial and practical use upon discharge  in facilitating mobility and self-care at the household level. 5. Physiatrist will provide 24 hour management of medical needs as well as oversight of the therapy plan/treatment and provide guidance as appropriate regarding the interaction of the two. 6. 24 hour rehab nursing will assist with bladder management, bowel management, safety, skin/wound care, disease management, medication administration and patient education  and  help integrate therapy concepts, techniques,education, etc. 7. PT will assess and treat for:  Lower extremity strength, range of motion, safety, neuromuscular reeducation, visual perceptual training,  and adaptive equipment.  Goals are: Modified independent to supervision. 8. OT will assess and treat for: Upper extremity strength, range of motion, safety, neuromuscular reeducation, visual perceptual training, ADLs, functional mobility.   Goals are: Modified independent to set up. 9. SLP will assess and treat for: Not applicable 10. Case Management and Social Worker will assess and treat for psychological issues and discharge planning. 11. Team conference will be held weekly to assess progress toward goals and to determine barriers to discharge. 12.  Patient will receive at least 3 hours of therapy per day at least 5 days per week. 13. ELOS and Prognosis: 2 to 3 weeks good   Medical Problem List and Plan: 1. 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 2.. DVT Prophylaxis/Anticoagulation: SCDs and ambulation. Consider screening Dopplers 3. Hypertension. Norvasc 10 mg daily, hydrochlorothiazide 25 mg daily, Avapro 300 mg daily, bystolic 10 mg daily. Monitor with increased activity. Review secondary stroke prevention with patient. 4. Elevated hemoglobin A1c: Sugars at this point are essentially within normal limits. Impressed the importance of appropriate diet 5. Mood: Social worker to  perform  depression screen. Team to provide ego support as appropriate. Patient seems fairly upbeat at this time.     Valincia Touch T. Tyreanna Bisesi M.D.     importance of appropriate diet  5. Mood: Social worker to perform depression screen. Team to provide ego support as appropriate. Patient seems fairly upbeat at this time.  Sherrilee Gilles.D

## 2011-12-12 NOTE — Progress Notes (Signed)
Social Work Patient ID: Craig [name], male   DOB: 11/02/1950, 61 y.o.   MRN: [id number]  Met with pt and wife to update team conference-goals supervision-min level and discharge 5/3. Wife to work until closer to discharge and then take her FMLA, so as not to use it up.  Both pleased  With the goals.  Wife to bring in forms for pt and herself to be completed.  Continue to work on discharge needs.

## 2011-12-12 NOTE — Progress Notes (Signed)
Patient ID: Craig [name], male   DOB: 19-Aug-1951, 61 y.o.   MRN: [id number]  Subjective/Complaints: HPI: 61 year old right-handed African American male with history of hypertension admitted April 4 with left-sided weakness and facial droop with dizziness as well as episode of nausea vomiting. MRI of the brain showed acute right posterior cerebral artery infarction as well as acute infarct left superior cerebellar territory No new c/os Review of Systems  HENT: Positive for hearing loss.     Objective: Vital Signs: Blood pressure 115/72, pulse 65, temperature 97.9 F (36.6 C), temperature source Oral, resp. rate 16, height 5\' 10"  (1.778 m), weight 96.2 kg (212 lb 1.3 oz), SpO2 98.00%. No results found. Results for orders placed during the hospital encounter of 12/10/11 (from the past 72 hour(s))  CBC     Status: Normal   Collection Time   12/11/11  6:15 AM      Component Value Range Comment   WBC 9.0  4.0 - 10.5 (K/uL)    RBC 5.58  4.22 - 5.81 (MIL/uL)    Hemoglobin 15.7  13.0 - 17.0 (g/dL)    HCT 40.9  81.1 - 91.4 (%)    MCV 83.7  78.0 - 100.0 (fL)    MCH 28.1  26.0 - 34.0 (pg)    MCHC 33.6  30.0 - 36.0 (g/dL)    RDW 78.2  95.6 - 21.3 (%)    Platelets 278  150 - 400 (K/uL)   COMPREHENSIVE METABOLIC PANEL     Status: Abnormal   Collection Time   12/11/11  6:15 AM      Component Value Range Comment   Sodium 136  135 - 145 (mEq/L)    Potassium 3.5  3.5 - 5.1 (mEq/L)    Chloride 97  96 - 112 (mEq/L)    CO2 27  19 - 32 (mEq/L)    Glucose, Bld 105 (*) 70 - 99 (mg/dL)    BUN 28 (*) 6 - 23 (mg/dL)    Creatinine, Ser 0.86 (*) 0.50 - 1.35 (mg/dL)    Calcium 9.4  8.4 - 10.5 (mg/dL)    Total Protein 7.4  6.0 - 8.3 (g/dL)    Albumin 3.7  3.5 - 5.2 (g/dL)    AST 16  0 - 37 (U/L)    ALT 18  0 - 53 (U/L)    Alkaline Phosphatase 57  39 - 117 (U/L)    Total Bilirubin 0.8  0.3 - 1.2 (mg/dL)    GFR calc non Af Amer 48 (*) >90 (mL/min)    GFR calc Af Amer 56 (*) >90 (mL/min)   DIFFERENTIAL      Status: Abnormal   Collection Time   12/11/11  6:15 AM      Component Value Range Comment   Neutrophils Relative 65  43 - 77 (%)    Neutro Abs 5.9  1.7 - 7.7 (K/uL)    Lymphocytes Relative 18  12 - 46 (%)    Lymphs Abs 1.6  0.7 - 4.0 (K/uL)    Monocytes Relative 16 (*) 3 - 12 (%)    Monocytes Absolute 1.4 (*) 0.1 - 1.0 (K/uL)    Eosinophils Relative 2  0 - 5 (%)    Eosinophils Absolute 0.1  0.0 - 0.7 (K/uL)    Basophils Relative 0  0 - 1 (%)    Basophils Absolute 0.0  0.0 - 0.1 (K/uL)     Physical Exam  Nursing note and vitals reviewed.  Constitutional: He is oriented  to person, place, and time. He appears well-developed.  HENT: Oral mucosa pink and moist.  Head: Normocephalic and atraumatic.  Eyes: Conjunctivae are normal. Pupils are equal, round, and reactive to light.  Neck: Normal range of motion. Neck supple. No thyromegaly present.  Cardiovascular: Normal rate. Regular rhythm without murmurs rubs or gallops.  Pulmonary/Chest: Effort normal and breath sounds normal. He has no wheezes, rales, rhonchi.  Abdominal: He exhibits no distension. There is no tenderness. Bowel sounds are positive  Musculoskeletal: He exhibits no edema.  Neurological: He is alert and oriented to person, place, and time.  Left visual field cut. His gaze seemed to be slightly disconjugate but he denied any diplopia. He did have a few beats of nystagmus with right and left gaze as well as vertical/ upward gaze. Patient with mild apraxia and decreased fine motor skills. Left arm and leg are quite ataxic.  He is left hemisensory loss at 1 out of 2. He has diminished attention to the left side as well today. Cognitively he is quite appropriate and pleasant. He has excellent insight and awareness. Memory is intact.  Skin: Skin is warm and dry.   Assessment/Plan: 1. 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 Physiatrist is providing close team supervision and 24 hour management of active medical problems listed below. Physiatrist and rehab team continue to assess barriers to discharge/monitor patient progress toward functional and medical goals. FIM: FIM - Bathing Bathing Steps Patient Completed: Chest;Left Arm;Abdomen;Front perineal area;Right upper leg Bathing: 3: Mod-Patient completes 5-7 10f 10 parts or 50-74%  FIM - Upper Body Dressing/Undressing Upper body dressing/undressing steps patient completed: Put head through opening of pull over shirt/dress;Pull shirt over trunk Upper body dressing/undressing: 3: Mod-Patient completed 50-74% of tasks FIM - Lower Body Dressing/Undressing Lower body dressing/undressing: 1: Total-Patient completed less than 25% of tasks     FIM - Archivist Transfers: 0-Activity did not occur  FIM - Games developer Transfer: 1: Bed > Chair or W/C: Total A (helper does all/Pt. < 25%);1: Chair or W/C > Bed: Total A (helper does all/Pt. < 25%)  FIM - Locomotion: Wheelchair Distance: 5; attempted use of R hemi technique but patient continued to turn L into wall with little awareness or insight of how to correct; changed to bilat foot propulsion with total A to cue LLE to extend and flex  Locomotion: Wheelchair: 1: Total Assistance/staff pushes wheelchair (Pt<25%) FIM - Locomotion: Ambulation Ambulation/Gait Assistance: 1: +2 Total assist Locomotion: Ambulation: 1: Two helpers  Comprehension Comprehension Mode: Auditory Comprehension: 4-Understands basic 75 - 89% of the time/requires cueing 10 - 24% of the time  Expression Expression Mode: Verbal Expression: 5-Expresses complex 90% of the time/cues < 10% of the time  Social Interaction Social Interaction: 5-Interacts appropriately 90% of the time - Needs monitoring or encouragement for participation or interaction.  Problem Solving Problem Solving: 2-Solves  basic 25 - 49% of the time - needs direction more than half the time to initiate, plan or complete simple activities  Memory Memory Mode: Asleep Memory: 2-Recognizes or recalls 25 - 49% of the time/requires cueing 51 - 75% of the time   Medical Problem List and Plan:  1. 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  2.. DVT Prophylaxis/Anticoagulation: SCDs and ambulation. Consider screening Dopplers  3. Hypertension. Norvasc 10 mg daily,  hydrochlorothiazide 25 mg daily, Avapro 300 mg daily, bystolic 10 mg daily. Monitor with increased activity. Review secondary stroke prevention with patient.  4. Elevated hemoglobin A1c: Sugars at this point are essentially within normal limits. Impressed the importance of appropriate diet  5. Mood: Social worker to perform depression screen. Team to provide ego support as appropriate. Patient seems fairly upbeat at this time.   LOS (Days) 2 A FACE TO FACE EVALUATION WAS PERFORMED  Honestie Kulik E 12/12/2011, 9:02 AM

## 2011-12-12 NOTE — Progress Notes (Signed)
Occupational Therapy Session Note  Patient Details  Name: Craig [name] MRN: [id number] Date of Birth: 20-Sep-1950  Today's Date: 12/12/2011 Time: 0930-1030 and 1430-1510 Time Calculation (min): 60 min and 40 min  Short Term Goals: Week 1:  OT Short Term Goal 1 (Week 1): Pt will complete LB bathing with mod assist at sit to stand level OT Short Term Goal 2 (Week 1): Pt will complete UB dressing with min assist OT Short Term Goal 3 (Week 1): Pt will complete LB dressing with mod assist at sit to stand level OT Short Term Goal 4 (Week 1): Pt will complete stand pivot transfer with min assist and min verbal cues  Skilled Therapeutic Interventions/Progress Updates:    1) Pt seen for ADL retraining at sink level.  Focus on transfers with squat pivot (over the back Bobath technique), sit to stand, and weight shifting in sitting to complete LB bathing and dressing.  Pt with significant pushing to Rt with attempted transfer, tactile and verbal cues for hand placement and weight shifting.  Completed LB bathing and dressing at sit to stand level with +2 this session for safety and to promote weight shifting as pt tends to exhibit extreme Lt lateral lean in standing.  Cues for use of LUE with visually tracking to compensate for decreased sensation in Lt hand/arm.  Pt with multiple drops of washcloth without realizing due to sensory impairment.  Constant cues for visual attention to Lt and to Lt hand to obtain items and increase functional use of LUE.  2) 1:1 OT with focus on squat pivot transfers, LUE NM re-ed, and visual tracking and Lt attention.  Pt with obvious Lt neglect and Lt field cut with peg board activity, cues provided to scan to Lt to make sure he didn't miss any spots.  Engaged in clock drawing activity where pt required mod verbal cues for orientation and initiation.  Pt demonstrated right bias with writing by not being able to start at Lt side of paper and requiring cues to scan to Lt.  Pt with  Lt lateral lean with writing activity to attempt to compensate for visual neglect and field cut.    Therapy Documentation Precautions:  Precautions Precautions: Fall Precaution Comments: significant visual deficit to Lt with Lt side inattention  Restrictions Weight Bearing Restrictions: No Pain: Pain Assessment Pain Score: 0-No pain  See FIM for current functional status  Therapy/Group: Individual Therapy  [name] 12/12/2011, 12:02 PM

## 2011-12-12 NOTE — Progress Notes (Signed)
Physical Therapy Session Note  Patient Details  Name: Craig [name] MRN: [id number] Date of Birth: 23-Feb-1951  Today's Date: 12/12/2011 Time: 1115-1200 Time Calculation (min): 45 min  Short Term Goals: Week 1:  PT Short Term Goal 1 (Week 1): Patient will perform bed mobility to L and R on flat bed with consistent mod A PT Short Term Goal 2 (Week 1): Patient will perform bed <> w/c transfers to L and R with consistent mod A and mod verbal cues for attention and placement of LLE/UE PT Short Term Goal 3 (Week 1): Patient will perform w/c mobility on unit x 150 with mod A with use of R hemi technique or bilat LE propulsion with increased attention to L side/environment PT Short Term Goal 4 (Week 1): Patient will perform standing balance, postural control and pre-gait training bilat UE supported max A with focus on improved orientation to midline, weight shift and controlled advancement of LLE  Therapy Documentation Precautions:  Precautions Precautions: Fall Precaution Comments: significant visual deficit to Lt with Lt side inattention  Restrictions Weight Bearing Restrictions: No Pain: Pain Assessment Pain Assessment: No/denies pain Pain Score: 0-No pain Mobility: Transfers Squat Pivot Transfers: 2: Max assist;With Museum/gallery curator Transfer Details: Tactile cues for initiation;Tactile cues for sequencing;Tactile cues for weight shifting;Tactile cues for placement;Visual cues/gestures for sequencing;Verbal cues for sequencing;Verbal cues for technique Squat Pivot Transfer Details (indicate cue type and reason): Squat pivot to L and R recliner >w/c <> mat with max A for scooting to edge of chair, safe L hand placement and L foot placement for WB and activation, full anterior lean and use of trunk rotation and head-hips relationship over the back to pivot fully to desired surface; No LOB transferring to R side with good activation of LLE extensors, LOB when transferring to L side with total  A to prevent fall secondary to poor weight shift to L.  Other Treatments: Treatments Neuromuscular Facilitation: Left;Upper Extremity;Lower Extremity;Activity to increase coordination;Activity to increase motor control;Activity to increase timing and sequencing;Activity to increase grading;Activity to increase sustained activation;Activity to increase lateral weight shifting;Activity to increase anterior-posterior weight shifting during sitting with back unsupported on mat with LUE on swiss ball for increased L trunk elongation and proprioceptive input to LUE and mirror with line down the center for midline orientation; patient able to maintain with intermittent cues to attend to LUE for placement and intermittent cues to minimize L lean; able to self correct.  Weight shift, L weight acceptance and visual scanning and attention to L side with forward lean and reach for targets on L side of mirror beginning with RUE reaching across midline, midline and to R to minimize L lean and LOB and then progressing to reaching with LUE with max A to guide LUE and mod-max A for L lateral weight shift; good activation of LLE extensors noted.   See FIM for current functional status  Therapy/Group: Individual Therapy  Edman Circle [facility] 12/12/2011, 12:24 PM

## 2011-12-12 NOTE — Progress Notes (Signed)
Speech Language Pathology Daily Session Note  Patient Details  Name: Craig [name] MRN: [id number] Date of Birth: [birth year]  Today's Date: 12/12/2011 Time: 0810-0905 Time Calculation (min): 55 min  Short Term Goals: Week 1: SLP Short Term Goal 1 (Week 1): Patient will attend to left of environment with modeate assist sematnic and visual cues SLP Short Term Goal 2 (Week 1): Patient will request help as needed with moderate assist semantic cues SLP Short Term Goal 3 (Week 1): Patient will maintain topic of conversation for 4 turns with minimal assist seamtnic cues SLP Short Term Goal 4 (Week 1): Patient will attend to the left durign reading and writign tasks with moderate assist sematnic and visual cues  Skilled Therapeutic Interventions: Session focused on initiation, problem solving and left attention with basic, familiar task (eating breakfast).  SLP facilitated session with max assist semantic, visual and tactile cues to utilize strategies for scanning far left to locate silverware and then moderate assist semantic cues to initiate self feeding.  Patient also required max assist semantic and visual cues to locate food left of midline on plate.  Patient stated "I know it's there but I just have not gotten it yet."  After breakfast patient requested SLP provide him with urinal and stated he did not need assistance; however, after increased wait time SLP provided moderate assist semantic cues to sequence and organize task as well as orient urinal.  Patient continues to demonstrate significant emergent awareness deficits which impact his ability to complete basic, familiar tasks.    Daily Session Precautions/Restrictions    FIM:  Comprehension Comprehension Mode: Auditory Comprehension: 4-Understands basic 75 - 89% of the time/requires cueing 10 - 24% of the time Expression Expression Mode: Verbal Expression: 5-Expresses basic needs/ideas: With extra time/assistive device Social  Interaction Social Interaction: 5-Interacts appropriately 90% of the time - Needs monitoring or encouragement for participation or interaction. Problem Solving Problem Solving: 2-Solves basic 25 - 49% of the time - needs direction more than half the time to initiate, plan or complete simple activities Memory Memory: 2-Recognizes or recalls 25 - 49% of the time/requires cueing 51 - 75% of the time General    Pain Pain Assessment Pain Assessment: No/denies pain Pain Score: 0-No pain  Therapy/Group: Individual Therapy  Charlane Ferretti., CCC-SLP [phone]  Dimitra Woodstock 12/12/2011, 2:55 PM

## 2011-12-13 MED ORDER — BISACODYL 10 MG RE SUPP
10.0000 mg | Freq: Every day | RECTAL | Status: DC | PRN
  Administered 2011-12-13 – 2011-12-20 (×2): 10 mg via RECTAL
  Filled 2011-12-13 (×2): qty 1

## 2011-12-13 NOTE — Progress Notes (Signed)
Occupational Therapy Session Note  Patient Details  Name: Jeovanni Heuring MRN: [id number] Date of Birth: 08-10-1951  Today's Date: 12/13/2011 Time: 0930-1030 Time Calculation (min): 60 min  Short Term Goals: Week 1:  OT Short Term Goal 1 (Week 1): Pt will complete LB bathing with mod assist at sit to stand level OT Short Term Goal 2 (Week 1): Pt will complete UB dressing with min assist OT Short Term Goal 3 (Week 1): Pt will complete LB dressing with mod assist at sit to stand level OT Short Term Goal 4 (Week 1): Pt will complete stand pivot transfer with min assist and min verbal cues  Skilled Therapeutic Interventions/Progress Updates:    Pt seen for ADL retraining at sink secondary to requiring visual cues (from mirror) for midline orientation and LUE proprioception.  Pt required +2 max assist squat pivot transfer to Lt as pt exhibited increased pushing tendencies and tendency to want to fully extend in standing.  Focus on sit <> stand, visual scanning, Lt attention, and problem solving with self-cares of bathing and dressing.  Pt demonstrated increased use of LUE with bathing using cues to visually attend to Lt hand and arm and watch it as it performs activities to compensate for decreased sensation and proprioception.  Pt required +2 with LB dressing secondary to Lt lateral lean in standing.  Engaged in weight shifting in standing with tactile cues at hips and visual cues to reach for second person with Rt hand for hi-five to increase weight shift.  Therapy Documentation Precautions:  Precautions Precautions: Fall Precaution Comments: significant visual deficit to Lt with Lt side inattention  Restrictions Weight Bearing Restrictions: No Pain:   no c/o pain this session.  See FIM for current functional status  Therapy/Group: Individual Therapy  [name] 12/13/2011, 11:33 AM

## 2011-12-13 NOTE — Progress Notes (Signed)
Patient ID: Craig [name], male   DOB: [birth year], 61 y.o.   MRN: [id number]  Subjective/Complaints: HPI: 61 year old right-handed African American male with history of hypertension admitted April 4 with left-sided weakness and facial droop with dizziness as well as episode of nausea vomiting. MRI of the brain showed acute right posterior cerebral artery infarction as well as acute infarct left superior cerebellar territory No new c/os Review of Systems  HENT: Positive for hearing loss.     Objective: Vital Signs: Blood pressure 126/78, pulse 75, temperature 98.5 F (36.9 C), temperature source Oral, resp. rate 16, height 5\' 10"  (1.778 m), weight 92.6 kg (204 lb 2.3 oz), SpO2 98.00%. No results found. Results for orders placed during the hospital encounter of 12/10/11 (from the past 72 hour(s))  CBC     Status: Normal   Collection Time   12/11/11  6:15 AM      Component Value Range Comment   WBC 9.0  4.0 - 10.5 (K/uL)    RBC 5.58  4.22 - 5.81 (MIL/uL)    Hemoglobin 15.7  13.0 - 17.0 (g/dL)    HCT 78.2  95.6 - 21.3 (%)    MCV 83.7  78.0 - 100.0 (fL)    MCH 28.1  26.0 - 34.0 (pg)    MCHC 33.6  30.0 - 36.0 (g/dL)    RDW 08.6  57.8 - 46.9 (%)    Platelets 278  150 - 400 (K/uL)   COMPREHENSIVE METABOLIC PANEL     Status: Abnormal   Collection Time   12/11/11  6:15 AM      Component Value Range Comment   Sodium 136  135 - 145 (mEq/L)    Potassium 3.5  3.5 - 5.1 (mEq/L)    Chloride 97  96 - 112 (mEq/L)    CO2 27  19 - 32 (mEq/L)    Glucose, Bld 105 (*) 70 - 99 (mg/dL)    BUN 28 (*) 6 - 23 (mg/dL)    Creatinine, Ser 6.29 (*) 0.50 - 1.35 (mg/dL)    Calcium 9.4  8.4 - 10.5 (mg/dL)    Total Protein 7.4  6.0 - 8.3 (g/dL)    Albumin 3.7  3.5 - 5.2 (g/dL)    AST 16  0 - 37 (U/L)    ALT 18  0 - 53 (U/L)    Alkaline Phosphatase 57  39 - 117 (U/L)    Total Bilirubin 0.8  0.3 - 1.2 (mg/dL)    GFR calc non Af Amer 48 (*) >90 (mL/min)    GFR calc Af Amer 56 (*) >90 (mL/min)   DIFFERENTIAL      Status: Abnormal   Collection Time   12/11/11  6:15 AM      Component Value Range Comment   Neutrophils Relative 65  43 - 77 (%)    Neutro Abs 5.9  1.7 - 7.7 (K/uL)    Lymphocytes Relative 18  12 - 46 (%)    Lymphs Abs 1.6  0.7 - 4.0 (K/uL)    Monocytes Relative 16 (*) 3 - 12 (%)    Monocytes Absolute 1.4 (*) 0.1 - 1.0 (K/uL)    Eosinophils Relative 2  0 - 5 (%)    Eosinophils Absolute 0.1  0.0 - 0.7 (K/uL)    Basophils Relative 0  0 - 1 (%)    Basophils Absolute 0.0  0.0 - 0.1 (K/uL)     Physical Exam  Nursing note and vitals reviewed.  Constitutional: He is oriented  to person, place, and time. He appears well-developed.  HENT: Oral mucosa pink and moist.  Head: Normocephalic and atraumatic.  Eyes: Conjunctivae are normal. Pupils are equal, round, and reactive to light.  Neck: Normal range of motion. Neck supple. No thyromegaly present.  Cardiovascular: Normal rate. Regular rhythm without murmurs rubs or gallops.  Pulmonary/Chest: Effort normal and breath sounds normal. He has no wheezes, rales, rhonchi.  Abdominal: He exhibits no distension. There is no tenderness. Bowel sounds are positive  Musculoskeletal: He exhibits no edema.  Neurological: He is alert and oriented to person, place, and time.  Left visual field cut. His gaze seemed to be slightly disconjugate but he denied any diplopia. Patient with mild apraxia and decreased fine motor skills. Left arm and leg are quite ataxic.  He is left hemisensory loss severe  He has diminished attention to the left side as well today. Cognitively he is quite appropriate and pleasant. He has excellent insight and awareness. Memory is intact.  Skin: Skin is warm and dry.   Assessment/Plan: 1. 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 Physiatrist is providing close team supervision and 24 hour management of  active medical problems listed below. Physiatrist and rehab team continue to assess barriers to discharge/monitor patient progress toward functional and medical goals. FIM: FIM - Bathing Bathing Steps Patient Completed: Chest;Left Arm;Abdomen;Front perineal area;Right upper leg Bathing: 3: Mod-Patient completes 5-7 59f 10 parts or 50-74%  FIM - Upper Body Dressing/Undressing Upper body dressing/undressing steps patient completed: Put head through opening of pull over shirt/dress Upper body dressing/undressing: 2: Max-Patient completed 25-49% of tasks FIM - Lower Body Dressing/Undressing Lower body dressing/undressing steps patient completed: Thread/unthread right pants leg;Thread/unthread right underwear leg Lower body dressing/undressing: 1: Two helpers     FIM - Archivist Transfers: 0-Activity did not occur  FIM - Banker Devices: Bed rails;Arm rests Bed/Chair Transfer: 2: Bed > Chair or W/C: Max A (lift and lower assist);2: Chair or W/C > Bed: Max A (lift and lower assist)  FIM - Locomotion: Wheelchair Distance: 5; attempted use of R hemi technique but patient continued to turn L into wall with little awareness or insight of how to correct; changed to bilat foot propulsion with total A to cue LLE to extend and flex  Locomotion: Wheelchair: 1: Total Assistance/staff pushes wheelchair (Pt<25%) FIM - Locomotion: Ambulation Ambulation/Gait Assistance: 1: +2 Total assist Locomotion: Ambulation: 0: Activity did not occur  Comprehension Comprehension Mode: Auditory Comprehension: 4-Understands basic 75 - 89% of the time/requires cueing 10 - 24% of the time  Expression Expression Mode: Verbal Expression: 5-Expresses basic needs/ideas: With extra time/assistive device  Social Interaction Social Interaction: 5-Interacts appropriately 90% of the time - Needs monitoring or encouragement for participation or interaction.  Problem  Solving Problem Solving: 2-Solves basic 25 - 49% of the time - needs direction more than half the time to initiate, plan or complete simple activities  Memory Memory Mode: Asleep Memory: 2-Recognizes or recalls 25 - 49% of the time/requires cueing 51 - 75% of the time   Medical Problem List and Plan:  1. 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  2.. DVT Prophylaxis/Anticoagulation: SCDs and ambulation. Consider screening Dopplers  3. Hypertension. Norvasc 10 mg daily, hydrochlorothiazide 25 mg daily, Avapro 300 mg daily, bystolic 10 mg daily. Monitor with  increased activity. Review secondary stroke prevention with patient.  4. Elevated hemoglobin A1c: Sugars at this point are essentially within normal limits. Impressed the importance of appropriate diet  5. Mood: Social worker to perform depression screen. Team to provide ego support as appropriate. Patient seems fairly upbeat at this time.   LOS (Days) 3 A FACE TO FACE EVALUATION WAS PERFORMED  Shelby Anderle E 12/13/2011, 7:37 AM

## 2011-12-13 NOTE — Progress Notes (Signed)
Speech Language Pathology Daily Session Note  Patient Details  Name: Craig [name] MRN: [id number] Date of Birth: [birth year]  Today's Date: 12/13/2011 Session 1 Time: 0802-0900 Time Calculation (min): 58 min Session 2 Time: 1500-1530 Time Calculation (min): 30 min  Short Term Goals: Week 1: SLP Short Term Goal 1 (Week 1): Patient will attend to left of environment with modeate assist sematnic and visual cues SLP Short Term Goal 2 (Week 1): Patient will request help as needed with moderate assist semantic cues SLP Short Term Goal 3 (Week 1): Patient will maintain topic of conversation for 4 turns with minimal assist seamtnic cues SLP Short Term Goal 4 (Week 1): Patient will attend to the left durign reading and writign tasks with moderate assist sematnic and visual cues  Skilled Therapeutic Interventions: Session 1 Session focused on cognition treatment with left attention and problem solving with basic, familiar tasks such as self feeding and reading tasks. SLP facilitated session by reducing environmental distractions; and provided min-mod assist semantic and tactile cues to complete basic problem solving with meal set up and mod assist semantic, tactile and visual cues to scan to left of page while reading meal order.  Patient maintained topic of conversation throughout session with 3-4 turns with supervision semantic cues. Of note, RN present during session for medication administration and SLP facilitated intake with mod-max assist semantic cues to initiate and problem solve what to do when pill remained in mouth after sip. Patient demonstrated  throat clears x3 during medication administration; suspected penetration due to the large sips/mixed consistency, which appears to be cognitively based.   Session 2 Session focused on carryover of strategies introduced in earlier session for scanning to the left of the environment; SLP facilitated various written, scanning tasks with a quiet  environment, increased wait time and minimal assist semantic cues to scan to the left with a small context activity.    Daily Session Precautions/Restrictions    FIM:  Comprehension Comprehension Mode: Auditory Comprehension: 5-Follows basic conversation/direction: With extra time/assistive device Expression Expression Mode: Verbal Expression: 5-Expresses complex 90% of the time/cues < 10% of the time Social Interaction Social Interaction: 5-Interacts appropriately 90% of the time - Needs monitoring or encouragement for participation or interaction. Problem Solving Problem Solving: 3-Solves basic 50 - 74% of the time/requires cueing 25 - 49% of the time Memory Memory: 4-Recognizes or recalls 75 - 89% of the time/requires cueing 10 - 24% of the time FIM - Eating Eating Activity: 5: Set-up assist for cut food;5: Set-up assist for open containers;5: Supervision/cues General    Pain Pain Assessment Pain Assessment: No/denies pain Pain Score: 0-No pain  Therapy/Group: Individual Therapy  Charlane Ferretti., CCC-SLP [phone]  [name] 12/13/2011, 1:24 PM

## 2011-12-13 NOTE — Patient Care Conference (Addendum)
Inpatient RehabilitationTeam Conference Note Date: 12/12/2011   Time: 10:45 AM    Patient Name: Craig [name]      Medical Record Number: [id number]  Date of Birth: 13-Oct-1950 Sex: Male         Room/Bed: 4033/4033-01 Payor Info: Payor: BLUE CROSS BLUE SHIELD  Plan: [facility] HEALTH PPO  Product Type: *No Product type*     Admitting Diagnosis: B CVA  Admit Date/Time:  12/10/2011  6:10 PM Admission Comments: No comment available   Primary Diagnosis:  Stroke Principal Problem: Stroke  Patient Active Problem List  Diagnoses Date Noted  . Stroke 12/11/2011  . CVA (cerebral infarction) 12/06/2011  . Hypertension, accelerated 05/15/2011    Expected Discharge Date: Expected Discharge Date: 01/04/12  Team Members Present: Physician: Dr. Claudette Laws Case Manager Present: Melanee Spry, RN Social Worker Present: Dossie Der, LCSW Nurse Present: Carlean Purl, RN PT Present: Edman Circle, PT;Caroline Adriana Simas, PT OT Present: [name], Felipa Eth, OT SLP Present: Fae Pippin, SLP Other (Discipline and Name): Tora Duck, PPS Coordinator     Current Status/Progress Goal Weekly Team Focus  Medical   Poor awareness of deficits, severe ataxia left upper and left lower extremity, left homonymous hemianopsia  Improve awareness of deficits  Educate patient   Bowel/Bladder   Last bowel movement 4-6 normal pattern doesn't go everyday, incontinent of urine  continent of both bowel and bladder, have bowel movement every 2-3 days  timed tolieting around clock, regulate bowel pattern   Swallow/Nutrition/ Hydration   Regular, thin with medication whole in puree/with thin liquid  least restrictive p.o. and ability to self feed  increase awareness, initiation and problem solving   ADL's   max assist transfer, total assist LB dsg, mod assist UB dsg, mod assist bathing  supervision overall  Lt body/environment attention, transfers   Mobility   total A +2  supervision-min A overall   orientation to midline, attention to L side, transfers and balance   Communication             Safety/Cognition/ Behavioral Observations  max-mod assist  minimal assist  increase awareness, initiation, self monitoring and correcting   Pain   no complaints of pain   pain level <=      Skin   Left elbow scab with allevyn dressing  No new skin breakdown  Monitor for skin breakdown      *See Interdisciplinary Assessment and Plan and progress notes for long and short-term goals  Barriers to Discharge: Has both visual spatial as well as coordination problems    Possible Resolutions to Barriers:  Continued therapy program    Discharge Planning/Teaching Needs:  Home with wife who will take a FMLA to provide assist      Team Discussion: Vision issues, poor awareness, problems organizing swallow-needs continuous cues w/ everything.   Revisions to Treatment Plan: none    Continued Need for Acute Rehabilitation Level of Care: The patient requires daily medical management by a physician with specialized training in physical medicine and rehabilitation for the following conditions: Daily direction of a multidisciplinary physical rehabilitation program to ensure safe treatment while eliciting the highest outcome that is of practical value to the patient.: Yes Daily medical management of patient stability for increased activity during participation in an intensive rehabilitation regime.: Yes Daily analysis of laboratory values and/or radiology reports with any subsequent need for medication adjustment of medical intervention for : Neurological problems  Brock Ra 12/13/2011, 5:27 PM

## 2011-12-13 NOTE — Progress Notes (Signed)
Physical Therapy Session Note  Patient Details  Name: Kirkland Figg MRN: [id number] Date of Birth: Jul 01, 1951  Today's Date: 12/13/2011 Time: 6578-4696 Time Calculation (min): 58 min  Short Term Goals: Week 1:  PT Short Term Goal 1 (Week 1): Patient will perform bed mobility to L and R on flat bed with consistent mod A PT Short Term Goal 2 (Week 1): Patient will perform bed <> w/c transfers to L and R with consistent mod A and mod verbal cues for attention and placement of LLE/UE PT Short Term Goal 3 (Week 1): Patient will perform w/c mobility on unit x 150 with mod A with use of R hemi technique or bilat LE propulsion with increased attention to L side/environment PT Short Term Goal 4 (Week 1): Patient will perform standing balance, postural control and pre-gait training bilat UE supported max A with focus on improved orientation to midline, weight shift and controlled advancement of LLE  Therapy Documentation Precautions:  Precautions Precautions: Fall Precaution Comments: significant visual deficit to Lt with Lt side inattention  Restrictions Weight Bearing Restrictions: No Pain: Pain Assessment Pain Assessment: No/denies pain Pain Score: 0-No pain Locomotion : Naval architect Assistance: 2: Max Chiropodist Details: Tactile cues for initiation;Tactile cues for sequencing;Tactile cues for weight beaing;Visual cues/gestures for precautions/safety;Visual cues/gestures for sequencing;Verbal cues for sequencing;Verbal cues for technique;Verbal cues for precautions/safety Wheelchair Propulsion: Right upper extremity;Right lower extremity Wheelchair Parts Management: Needs assistance Distance: 150; max cues for sequencing and problem solving propulsion sequence with R side and assistance for full WB through RLE to propel chair; patient continues to attempt to rock his body or use RUE only and continues to hit objects on L side with very little insight,  awareness or ability to self-correct  Other Treatments: Treatments Neuromuscular Facilitation: Left;Upper Extremity;Lower Extremity;Activity to increase coordination;Activity to increase motor control;Activity to increase timing and sequencing;Activity to increase sustained activation;Activity to increase lateral weight shifting;Activity to increase anterior-posterior weight shifting while performing multiple sit <> stand at mirror with bilat UE pushing into mirror on wall for WB and increased proprioceptive input to LUE and for full anterior lean and weight shift of COG forward in midline; patient to maintain contact with mirror and slide LUE up to top of mirror for increased trunk elongation and sustained activation of LLE extensors. Standing with RUE support on bedside table with lateral weight shifts to LLE and tactile cues for activation of extensors in stance for stability while performing forward and retro stepping with RLE with mod-max A for balance and to maintain attention to task and L side.    See FIM for current functional status  Therapy/Group: Individual Therapy  Edman Circle [facility] 12/13/2011, 5:03 PM

## 2011-12-14 NOTE — Progress Notes (Signed)
Occupational Therapy Session Note  Patient Details  Name: Craig [name] MRN: [id number] Date of Birth: 1951/04/04  Today's Date: 12/14/2011 Time: 0930-1030 Time Calculation (min): 60 min  Short Term Goals: Week 1:  OT Short Term Goal 1 (Week 1): Pt will complete LB bathing with mod assist at sit to stand level OT Short Term Goal 2 (Week 1): Pt will complete UB dressing with min assist OT Short Term Goal 3 (Week 1): Pt will complete LB dressing with mod assist at sit to stand level OT Short Term Goal 4 (Week 1): Pt will complete stand pivot transfer with min assist and min verbal cues  Skilled Therapeutic Interventions/Progress Updates:    Pt seen for ADL retraining at sink with use of mirror for visual cues with midline orientation.  Pt required +2 for safety with squat pivot transfer from bed secondary to increased pushing and inability to sequence.  Encouraged pt to attempt to incorporate use of LUE with bathing and opening deodorant.  Encouraged pt to use vision/visual cues to help compensate for sensory loss in LUE.  Pt with increased alien arm movements this session.  +2 for safety with LB dressing secondary to Lt lateral lean in standing.  Engaged in weight shifting with reaching to Rt outside BOS to encourage weight shifting.    Therapy Documentation Precautions:  Precautions Precautions: Fall Precaution Comments: significant visual deficit to Lt with Lt side inattention  Restrictions Weight Bearing Restrictions: No General:   Vital Signs:   Pain:   No c/o pain this session.  See FIM for current functional status  Therapy/Group: Individual Therapy  [name] 12/14/2011, 11:29 AM

## 2011-12-14 NOTE — Progress Notes (Signed)
Occupational Therapy Session Note  Patient Details  Name: Craig [name] MRN: [id number] Date of Birth: 16-Oct-1950  Today's Date: 12/14/2011 Time: 1133-1206 Time Calculation (min): 33 min  Short Term Goals: Week 1:  OT Short Term Goal 1 (Week 1): Pt will complete LB bathing with mod assist at sit to stand level OT Short Term Goal 2 (Week 1): Pt will complete UB dressing with min assist OT Short Term Goal 3 (Week 1): Pt will complete LB dressing with mod assist at sit to stand level OT Short Term Goal 4 (Week 1): Pt will complete stand pivot transfer with min assist and min verbal cues  Skilled Therapeutic Interventions/Progress Updates:    Neuro muscular re-ed session with focus on midline orientation in standing.  Pt unable to maintain static standing at midline without mod to max assist.  Interprets that he is leaning to the right when in fact he is actually pushing himself to the left.  He exhibits no proprioception in the left LE or UE to help with his balance.  He has to compensate with use of vision to know where his left leg or arm are positioned.  Attempted to work on weight shifts to the right by having pt bring his shoulder to an established target.  He was able to perform this but not able to maintain weightshift on the right.  Pt's wife and son present for session.  Explained to pt and wife about his sensory and coordination deficits.   Therapy Documentation Precautions:  Precautions Precautions: Fall Precaution Comments: significant visual deficit to Lt with Lt side inattention  Restrictions Weight Bearing Restrictions: No  Pain: Pain Assessment Pain Assessment: No/denies pain ADL: See FIM for current functional status  Therapy/Group: Individual Therapy  [name] 12/14/2011, 12:41 PM

## 2011-12-14 NOTE — Progress Notes (Signed)
Speech Language Pathology Daily Session Note  Patient Details  Name: Craig [name] MRN: [id number] Date of Birth: 1950-10-10  Today's Date: 12/14/2011 Time: 0805-0900 Time Calculation (min): 55 min  Short Term Goals: Week 1: SLP Short Term Goal 1 (Week 1): Patient will attend to left of environment with modeate assist sematnic and visual cues SLP Short Term Goal 2 (Week 1): Patient will request help as needed with moderate assist semantic cues SLP Short Term Goal 3 (Week 1): Patient will maintain topic of conversation for 4 turns with minimal assist seamtnic cues SLP Short Term Goal 4 (Week 1): Patient will attend to the left durign reading and writign tasks with moderate assist sematnic and visual cues  Skilled Therapeutic Interventions: Session focused on skilled treatment of cognition.  SLP facilitated session with minimal assist semantic and tactile cues to set up and consume breakfast with attention to items left of midline, request help as needed and maintain topic of conversation.  Patient required moderate assist semantic cues to utilize calendar and recall recent hospital course.  Speech is characterized by frontal lisp during conversation; SLP suspects this to be baseline and will confirm with wife.  Daily Session Precautions/Restrictions  Precautions Precautions: Fall Precaution Comments: L ailien arm, L inattention to space and body, L field cut FIM:  Comprehension Comprehension Mode: Auditory Comprehension: 5-Follows basic conversation/direction: With no assist Expression Expression Mode: Verbal Expression: 5-Expresses complex 90% of the time/cues < 10% of the time Social Interaction Social Interaction: 6-Interacts appropriately with others with medication or extra time (anti-anxiety, antidepressant). Problem Solving Problem Solving: 4-Solves basic 75 - 89% of the time/requires cueing 10 - 24% of the time Memory Memory: 4-Recognizes or recalls 75 - 89% of the  time/requires cueing 10 - 24% of the time FIM - Eating Eating Activity: 5: Set-up assist for cut food;5: Set-up assist for open containers;5: Supervision/cues General    Pain Pain Assessment Pain Assessment: No/denies pain Pain Score: 0-No pain  Therapy/Group: Individual Therapy  Charlane Ferretti., CCC-SLP [phone]  Halston Fairclough 12/14/2011, 3:22 PM

## 2011-12-14 NOTE — Progress Notes (Signed)
Physical Therapy Session Note  Patient Details  Name: Craig [name] MRN: [id number] Date of Birth: 12-07-1950  Today's Date: 12/14/2011 Time: 1305-1403 Time Calculation (min): 58 min  Short Term Goals: Week 1:  PT Short Term Goal 1 (Week 1): Patient will perform bed mobility to L and R on flat bed with consistent mod A PT Short Term Goal 2 (Week 1): Patient will perform bed <> w/c transfers to L and R with consistent mod A and mod verbal cues for attention and placement of LLE/UE PT Short Term Goal 3 (Week 1): Patient will perform w/c mobility on unit x 150 with mod A with use of R hemi technique or bilat LE propulsion with increased attention to L side/environment PT Short Term Goal 4 (Week 1): Patient will perform standing balance, postural control and pre-gait training bilat UE supported max A with focus on improved orientation to midline, weight shift and controlled advancement of LLE  Skilled Therapeutic Interventions/Progress Updates:    NMR- addressed inattention L body and space via seated trunk rotation with pt moving LUE with RUE, postural control for midline position with mirror and verbal instruction while still in [place]; standing and gait in sara- pt unable to keep LUE on sara and arm becomes more active with challenges, cues for midline and to activate LLE; gait with +2 A first 70 ft with mod A to advance and place LLE in less adducted position and max cues, second trail 15 ft with max A to advance LLE and total instructional cues, postural control worsened also with pt falling L, was able to fix it in static standing  Therapy Documentation Precautions:  Precautions Precautions: Fall Precaution Comments: L ailien arm, L inattention to space and body, L field cut Restrictions Weight Bearing Restrictions: No    Vital Signs:70 HR   Pain: Pain Assessment Pain Assessment: No/denies pain    Locomotion : Ambulation Ambulation/Gait Assistance: 1: +2 Total assist  Other  Treatments:  discussed HBP as risk factor  See FIM for current functional status  Therapy/Group: Individual Therapy  Michaelene Song 12/14/2011, 2:06 PM

## 2011-12-14 NOTE — Progress Notes (Signed)
Patient ID: Craig [name], male   DOB: [birth year], 61 y.o.   MRN: [id number]  Subjective/Complaints: HPI: 61 year old right-handed African American male with history of hypertension admitted April 4 with left-sided weakness and facial droop with dizziness as well as episode of nausea vomiting. MRI of the brain showed acute right posterior cerebral artery infarction as well as acute infarct left superior cerebellar territory No new c/os Review of Systems  HENT: Positive for hearing loss.     Objective: Vital Signs: Blood pressure 113/66, pulse 67, temperature 98.8 F (37.1 C), temperature source Oral, resp. rate 18, height 5\' 10"  (1.778 m), weight 92.6 kg (204 lb 2.3 oz), SpO2 95.00%. No results found. No results found for this or any previous visit (from the past 72 hour(s)).  Physical Exam  Nursing note and vitals reviewed.  Constitutional: He is oriented to person, place, and time. He appears well-developed.  HENT: Oral mucosa pink and moist.  Head: Normocephalic and atraumatic.  Eyes: Conjunctivae are normal. Pupils are equal, round, and reactive to light.  Neck: Normal range of motion. Neck supple. No thyromegaly present.  Cardiovascular: Normal rate. Regular rhythm without murmurs rubs or gallops.  Pulmonary/Chest: Effort normal and breath sounds normal. He has no wheezes, rales, rhonchi.  Abdominal: He exhibits no distension. There is no tenderness. Bowel sounds are positive  Musculoskeletal: He exhibits no edema.  Neurological: He is alert and oriented to person, place, and time.  Left visual field cut. His gaze seemed to be slightly disconjugate but he denied any diplopia. Patient with mild apraxia and decreased fine motor skills. Left arm and leg are quite ataxic.  He is left hemisensory loss severe  He has diminished attention to the left side as well today. Cognitively he is quite appropriate and pleasant. He has excellent insight and awareness. Memory is intact.  Skin: Skin is  warm and dry.   Assessment/Plan: 1. 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 Physiatrist is providing close team supervision and 24 hour management of active medical problems listed below. Physiatrist and rehab team continue to assess barriers to discharge/monitor patient progress toward functional and medical goals. FIM: FIM - Bathing Bathing Steps Patient Completed: Chest;Right Arm;Left Arm;Abdomen;Front perineal area;Right upper leg;Left upper leg;Right lower leg (including foot);Left lower leg (including foot) Bathing: 4: Min-Patient completes 8-9 90f 10 parts or 75+ percent  FIM - Upper Body Dressing/Undressing Upper body dressing/undressing steps patient completed: Thread/unthread right sleeve of pullover shirt/dresss;Put head through opening of pull over shirt/dress Upper body dressing/undressing: 3: Mod-Patient completed 50-74% of tasks FIM - Lower Body Dressing/Undressing Lower body dressing/undressing steps patient completed: Thread/unthread right underwear leg Lower body dressing/undressing: 1: Two helpers  FIM - Hotel manager Devices: Toilet Aid/prosthesis/orthosis Toileting: 1: Total-Patient completed zero steps, helper did all 3  FIM - Archivist Transfers: 0-Activity did not occur  FIM - Banker Devices: Bed rails;Arm rests Bed/Chair Transfer: 3: Bed > Chair or W/C: Mod A (lift or lower assist);3: Chair or W/C > Bed: Mod A (lift or lower assist)  FIM - Locomotion: Wheelchair Distance: 150 Locomotion: Wheelchair: 2: Travels 150 ft or more: maneuvers on rugs and over door sills with maximal assistance (Pt: 25 - 49%) FIM - Locomotion: Ambulation Ambulation/Gait Assistance: 1: +2 Total assist Locomotion: Ambulation: 0: Activity did not occur  Comprehension Comprehension  Mode: Auditory Comprehension: 5-Understands basic 90% of  the time/requires cueing < 10% of the time  Expression Expression Mode: Verbal Expression: 5-Expresses basic 90% of the time/requires cueing < 10% of the time.  Social Interaction Social Interaction: 5-Interacts appropriately 90% of the time - Needs monitoring or encouragement for participation or interaction.  Problem Solving Problem Solving: 3-Solves basic 50 - 74% of the time/requires cueing 25 - 49% of the time  Memory Memory Mode: Asleep Memory: 3-Recognizes or recalls 50 - 74% of the time/requires cueing 25 - 49% of the time   Medical Problem List and Plan:  1. 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  2.. DVT Prophylaxis/Anticoagulation: SCDs and ambulation. Consider screening Dopplers  3. Hypertension. Norvasc 10 mg daily, hydrochlorothiazide 25 mg daily, Avapro 300 mg daily, bystolic 10 mg daily. Monitor with increased activity. Review secondary stroke prevention with patient.  4. Elevated hemoglobin A1c: Sugars at this point are essentially within normal limits. Impressed the importance of appropriate diet  5. Mood: Social worker to perform depression screen. Team to provide ego support as appropriate. Patient seems fairly upbeat at this time.   LOS (Days) 4 A FACE TO FACE EVALUATION WAS PERFORMED  Axil Copeman E 12/14/2011, 7:51 AM

## 2011-12-15 NOTE — Progress Notes (Signed)
Occupational Therapy Session Note  Patient Details  Name: Craig [name] MRN: [id number] Date of Birth: [birth year]  Today's Date: 12/15/2011 Time: 4098-1191 and 4782-9562 Time Calculation (min): 47 min and 45 min  Short Term Goals: Week 1:  OT Short Term Goal 1 (Week 1): Pt will complete LB bathing with mod assist at sit to stand level OT Short Term Goal 2 (Week 1): Pt will complete UB dressing with min assist OT Short Term Goal 3 (Week 1): Pt will complete LB dressing with mod assist at sit to stand level OT Short Term Goal 4 (Week 1): Pt will complete stand pivot transfer with min assist and min verbal cues  Skilled Therapeutic Interventions/Progress Updates:    1) Pt seen for ADL retraining at sink.  Focus on squat pivot transfers, sit <> stand, attention and scanning to Lt body and environment, and awareness of LUE/LLE in functional tasks.  Sit to stand with min assist +1 for pericare, pt demonstrated decreased awareness of LLE with LLE withdrawal and ?alien leg in standing.  +2 for LB dressing with tactile cues at Lt knee to attempt to keep LLE planted.    2) 1:1 OT with focus on self-feeding with scanning to Lt environment to obtain items and ask for assistance with opening packets and cutting food.  Pt required mod cues for scanning to Lt.  Engaged in squat pivot transfers with pt performing transfer to Rt with steady assist only, mod assist +2 (over the back) to Lt with second person for safety.  Reaching activity in standing with RUE with cues to keep LUE planted on table and tactile cues at LLE to keep it planted on the ground.  Engaged in weight shifting in standing with/without reaching to increase awareness of midline orientation.    Therapy Documentation Precautions:  Precautions Precautions: Fall Precaution Comments: L inattention and L alien arm, L field cut Restrictions Weight Bearing Restrictions: No Pain: Pain Assessment Pain Assessment: No/denies pain  See FIM for  current functional status  Therapy/Group: Individual Therapy  [name] 12/15/2011, 12:13 PM

## 2011-12-15 NOTE — Progress Notes (Signed)
Patient ID: Taedyn Glasscock, male   DOB: September 19, 1950, 61 y.o.   MRN: [id number]  Subjective/Complaints: HPI: 61 year old right-handed African American male with history of hypertension admitted April 4 with left-sided weakness and facial droop with dizziness as well as episode of nausea vomiting. MRI of the brain showed acute right posterior cerebral artery infarction as well as acute infarct left superior cerebellar territory No new c/os RN notes BPs running on low side Review of Systems  HENT: Positive for hearing loss.     Objective: Vital Signs: Blood pressure 103/66, pulse 62, temperature 98.6 F (37 C), temperature source Oral, resp. rate 16, height 5\' 10"  (1.778 m), weight 92.6 kg (204 lb 2.3 oz), SpO2 95.00%. No results found. No results found for this or any previous visit (from the past 72 hour(s)).  Physical Exam  Nursing note and vitals reviewed.  Constitutional: He is oriented to person, place, and time. He appears well-developed.  HENT: Oral mucosa pink and moist.  Head: Normocephalic and atraumatic.  Eyes: Conjunctivae are normal. Pupils are equal, round, and reactive to light.  Neck: Normal range of motion. Neck supple. No thyromegaly present.  Cardiovascular: Normal rate. Regular rhythm without murmurs rubs or gallops.  Pulmonary/Chest: Effort normal and breath sounds normal. He has no wheezes, rales, rhonchi.  Abdominal: He exhibits no distension. There is no tenderness. Bowel sounds are positive  Musculoskeletal: He exhibits no edema.  Neurological: He is alert and oriented to person, place, and time.  Left visual field cut. His gaze seemed to be slightly disconjugate but he denied any diplopia. Patient with mild apraxia and decreased fine motor skills. Left arm and leg are quite ataxic.  He is left hemisensory loss severe  He has diminished attention to the left side as well today. Cognitively he is quite appropriate and pleasant. He has excellent insight and awareness.  Memory is intact.  Skin: Skin is warm and dry.   Assessment/Plan: 1. 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 Physiatrist is providing close team supervision and 24 hour management of active medical problems listed below. Physiatrist and rehab team continue to assess barriers to discharge/monitor patient progress toward functional and medical goals. FIM: FIM - Bathing Bathing Steps Patient Completed: Chest;Right Arm;Left Arm;Abdomen;Front perineal area;Right upper leg;Left upper leg Bathing: 3: Mod-Patient completes 5-7 36f 10 parts or 50-74%  FIM - Upper Body Dressing/Undressing Upper body dressing/undressing steps patient completed: Put head through opening of pull over shirt/dress Upper body dressing/undressing: 2: Max-Patient completed 25-49% of tasks FIM - Lower Body Dressing/Undressing Lower body dressing/undressing steps patient completed: Thread/unthread right pants leg Lower body dressing/undressing: 1: Two helpers  FIM - Hotel manager Devices: Toilet Aid/prosthesis/orthosis Toileting: 1: Two helpers  FIM - Archivist Transfers: 1-Two helpers  FIM - Banker Devices: Bed rails;Arm rests Bed/Chair Transfer: 1: Two helpers  FIM - Locomotion: Wheelchair Distance: 150 Locomotion: Wheelchair: 1: Travels less than 50 ft with minimal assistance (Pt.>75%) FIM - Locomotion: Ambulation Locomotion: Ambulation Assistive Devices: Sara Plus Ambulation/Gait Assistance: 1: +2 Total assist Locomotion: Ambulation: 1: Two helpers  Comprehension Comprehension Mode: Auditory Comprehension: 5-Follows basic conversation/direction: With no assist  Expression Expression Mode: Verbal Expression: 5-Expresses basic needs/ideas: With extra time/assistive device  Social Interaction Social  Interaction: 6-Interacts appropriately with others with medication or extra time (anti-anxiety, antidepressant).  Problem Solving Problem Solving: 5-Solves basic 90% of the  time/requires cueing < 10% of the time  Memory Memory Mode: Asleep Memory: 4-Recognizes or recalls 75 - 89% of the time/requires cueing 10 - 24% of the time   Medical Problem List and Plan:  1. 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  2.. DVT Prophylaxis/Anticoagulation: SCDs and ambulation. Consider screening Dopplers  3. Hypertension. Norvasc 10 mg daily, hydrochlorothiazide 25 mg daily, Avapro 300 mg daily, bystolic 10 mg daily. Monitor with increased activity. Review secondary stroke prevention with patient.  4. Elevated hemoglobin A1c: Sugars at this point are essentially within normal limits. Impressed the importance of appropriate diet  5. Mood: Social worker to perform depression screen. Team to provide ego support as appropriate. Patient seems fairly upbeat at this time.   LOS (Days) 5 A FACE TO FACE EVALUATION WAS PERFORMED  Eshan Trupiano E 12/15/2011, 9:08 AM

## 2011-12-15 NOTE — Progress Notes (Signed)
Physical Therapy Session Note  Patient Details  Name: Craig [name] MRN: [id number] Date of Birth: [birth year]  Today's Date: 12/15/2011 Time: 1102-1200 Time Calculation (min): 58 min  Short Term Goals: Week 1:  PT Short Term Goal 1 (Week 1): Patient will perform bed mobility to L and R on flat bed with consistent mod A PT Short Term Goal 2 (Week 1): Patient will perform bed <> w/c transfers to L and R with consistent mod A and mod verbal cues for attention and placement of LLE/UE PT Short Term Goal 3 (Week 1): Patient will perform w/c mobility on unit x 150 with mod A with use of R hemi technique or bilat LE propulsion with increased attention to L side/environment PT Short Term Goal 4 (Week 1): Patient will perform standing balance, postural control and pre-gait training bilat UE supported max A with focus on improved orientation to midline, weight shift and controlled advancement of LLE      Skilled Therapeutic Interventions/Progress Updates: treatment focused on w/c mobility, transfer and pre-gait training, and neuromuscular re-education L trunk, LUE, and L LE via manual and VCs, visual feedback via mirror.  W/c mobility using hemi technique with RUE and RLE x 60' with min A.  Pt benefited from 5# wt on L foot dorsum, strapped to legrest to prevent L knee flexion (associated reaction) as RLE extended forward .  Pt exhibited L inattention, requiring min VCs to avoid obstacles.    Stand pivot transfer with +2 for safety, to R, focusing on wt shifting to R before lifting LLE, L knee control, and midline orientation.  Pt performed < 50% overall.  Transfer training without use of UEs for sit>partial stand x 10 focusing on bearing wt LLE.  Transfer to L as above, with greater difficulty shifting to R in order to move LLE.    Pre-gait training in standing with table and mirror in front of pt, +2 assist required for wt shifting L<>R, L LE stability, and maintaining L foot placement.  Pt performed RUE  activity reaching to R within BOS to pick up cones and move to L.  LUE demonstrated various random movements on and off table in front, but did not interfere with activity.  In standing with full body support of wall on R, +2 A, pt performed L hip flexion with mod A for foot placement, focusing on hip abduction with flexion.     Therapy Documentation Precautions:  Precautions Precautions: Fall Precaution Comments: L inattention and L alien arm, L field cut Restrictions Weight Bearing Restrictions: No    Pain: Pain Assessment Pain Assessment: No/denies pain         See FIM for current functional status  Therapy/Group: Individual Therapy  Craig [name] 12/15/2011, 12:04 PM

## 2011-12-15 NOTE — Progress Notes (Signed)
Speech Language Pathology Daily Session Note  Patient Details  Name: Craig [name] MRN: [id number] Date of Birth: [birth year]  Today's Date: 12/15/2011 Time: 0830-0900 Time Calculation (min): 30 min  Short Term Goals: Week 1: SLP Short Term Goal 1 (Week 1): Patient will attend to left of environment with modeate assist sematnic and visual cues SLP Short Term Goal 2 (Week 1): Patient will request help as needed with moderate assist semantic cues SLP Short Term Goal 3 (Week 1): Patient will maintain topic of conversation for 4 turns with minimal assist seamtnic cues SLP Short Term Goal 4 (Week 1): Patient will attend to the left durign reading and writign tasks with moderate assist sematnic and visual cues  Skilled Therapeutic Interventions: Session focused on problem solving with basic, familiar task with SLP provided a quiet environment, increased wait time and mod-max assist semantic and tactile cues to problem solve set up of tray, attention to his left upper extremity and to utilize scanning strategies.   Recommend continued need for setup assist and intermittent supervision for cues to attend to left of environment.  Daily Session Precautions/Restrictions  Precautions Precautions: Fall Precaution Comments: L inattention and L alien arm, L field cut FIM:  Comprehension Comprehension Mode: Auditory Comprehension: 4-Understands basic 75 - 89% of the time/requires cueing 10 - 24% of the time Expression Expression Mode: Verbal Expression: 6-Expresses complex ideas: With extra time/assistive device Social Interaction Social Interaction: 6-Interacts appropriately with others with medication or extra time (anti-anxiety, antidepressant). Problem Solving Problem Solving: 2-Solves basic 25 - 49% of the time - needs direction more than half the time to initiate, plan or complete simple activities Memory Memory: 3-Recognizes or recalls 50 - 74% of the time/requires cueing 25 - 49% of the  time General    Pain Pain Assessment Pain Assessment: No/denies pain Pain Score: 0-No pain  Therapy/Group: Individual Therapy  Charlane Ferretti., CCC-SLP [phone]  Craig [name] 12/15/2011, 12:22 PM

## 2011-12-16 NOTE — Progress Notes (Signed)
Slept good during night. Condom cath in place. Refused SCD's at HS. Dressing in place to left elbow. Bed alarm in place for patient's safety. Decreased safety awareness. Tawanna Solo

## 2011-12-16 NOTE — Progress Notes (Signed)
Occupational Therapy Session Note  Patient Details  Name: Wendall Isabell MRN: [id number] Date of Birth: [birth year]  Today's Date: 12/16/2011 Time: 1345-1440 Time Calculation (min): 55 min  Skilled Therapeutic Interventions/Progress Updates: neuro re-education EOM and chair for L alien UE and fine motor  And apraxic work; L attention and awareness of left hand.  Patient with insight to voluntarily share that he has to look left and at his left hand occasionally 'because of the stroke.'  Patient with L LE extensor tone mild on transfers and sit to stand.   Transferred today with Min to Mod A based on how perpared he was with caregiver clear instructions prior to the transfer.     Therapy Documentation Precautions:  Precautions Precautions: Fall Precaution Comments: L inattention and L alien arm, L field cut Restrictions Weight Bearing Restrictions: No  Pain: no c/os   See FIM for current functional status  Therapy/Group: Individual Therapy  Bud Face [facility] 12/16/2011, 4:46 PM

## 2011-12-16 NOTE — Progress Notes (Signed)
Patient ID: Craig [name], male   DOB: February 14, 1951, 61 y.o.   MRN: [id number]  Subjective/Complaints: HPI: 61 year old right-handed African American male with history of hypertension admitted April 4 with left-sided weakness and facial droop with dizziness as well as episode of nausea vomiting. MRI of the brain showed acute right posterior cerebral artery infarction as well as acute infarct left superior cerebellar territory No new c/os  Review of Systems  HENT: Positive for hearing loss.     Objective: Vital Signs: Blood pressure 114/70, pulse 62, temperature 97.3 F (36.3 C), temperature source Oral, resp. rate 16, height 5\' 10"  (1.778 m), weight 92.6 kg (204 lb 2.3 oz), SpO2 100.00%. No results found. No results found for this or any previous visit (from the past 72 hour(s)).  Physical Exam  Nursing note and vitals reviewed.  Constitutional: He is oriented to person, place, and time. He appears well-developed.  HENT: Oral mucosa pink and moist.  Head: Normocephalic and atraumatic.  Eyes: Conjunctivae are normal. Pupils are equal, round, and reactive to light.  Neck: Normal range of motion. Neck supple. No thyromegaly present.  Cardiovascular: Normal rate. Regular rhythm without murmurs rubs or gallops.  Pulmonary/Chest: Effort normal and breath sounds normal. He has no wheezes, rales, rhonchi.  Abdominal: He exhibits no distension. There is no tenderness. Bowel sounds are positive  Musculoskeletal: He exhibits no edema.  Neurological: He is alert and oriented to person, place, and time.  Left visual field cut. His gaze seemed to be slightly disconjugate but he denied any diplopia. Patient with mild apraxia and decreased fine motor skills. Left arm and leg are quite ataxic.  He is left hemisensory loss severe  He has diminished attention to the left side as well today. Cognitively he is quite appropriate and pleasant. He has excellent insight and awareness. Memory is intact.  Skin: Skin is  warm and dry.   Assessment/Plan: 1. 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 Physiatrist is providing close team supervision and 24 hour management of active medical problems listed below. Physiatrist and rehab team continue to assess barriers to discharge/monitor patient progress toward functional and medical goals. FIM: FIM - Bathing Bathing Steps Patient Completed: Chest;Right Arm;Left Arm;Abdomen;Front perineal area;Right upper leg;Left upper leg;Right lower leg (including foot);Left lower leg (including foot) Bathing: 4: Min-Patient completes 8-9 46f 10 parts or 75+ percent  FIM - Upper Body Dressing/Undressing Upper body dressing/undressing steps patient completed: Put head through opening of pull over shirt/dress Upper body dressing/undressing: 2: Max-Patient completed 25-49% of tasks FIM - Lower Body Dressing/Undressing Lower body dressing/undressing steps patient completed: Thread/unthread right pants leg Lower body dressing/undressing: 1: Two helpers  FIM - Hotel manager Devices: Toilet Aid/prosthesis/orthosis Toileting: 1: Two helpers  FIM - Archivist Transfers: 1-Two helpers  FIM - Banker Devices: Arm rests Bed/Chair Transfer: 4: Supine > Sit: Min A (steadying Pt. > 75%/lift 1 leg);4: Sit > Supine: Min A (steadying pt. > 75%/lift 1 leg);4: Bed > Chair or W/C: Min A (steadying Pt. > 75%);4: Chair or W/C > Bed: Min A (steadying Pt. > 75%)  FIM - Locomotion: Wheelchair Distance: 150 Locomotion: Wheelchair: 1: Travels less than 50 ft with minimal assistance (Pt.>75%) FIM - Locomotion: Ambulation Locomotion: Ambulation Assistive Devices: Sara Plus Ambulation/Gait Assistance: 1: +2 Total assist Locomotion: Ambulation: 0: Activity did not occur  Comprehension Comprehension  Mode: Auditory  Comprehension: 4-Understands basic 75 - 89% of the time/requires cueing 10 - 24% of the time  Expression Expression Mode: Verbal Expression: 6-Expresses complex ideas: With extra time/assistive device  Social Interaction Social Interaction: 6-Interacts appropriately with others with medication or extra time (anti-anxiety, antidepressant).  Problem Solving Problem Solving: 2-Solves basic 25 - 49% of the time - needs direction more than half the time to initiate, plan or complete simple activities  Memory Memory Mode: Asleep Memory: 3-Recognizes or recalls 50 - 74% of the time/requires cueing 25 - 49% of the time   Medical Problem List and Plan:  1. 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  2.. DVT Prophylaxis/Anticoagulation: SCDs and ambulation. Consider screening Dopplers  3. Hypertension. Norvasc 10 mg daily, hydrochlorothiazide 25 mg daily, Avapro 300 mg daily, bystolic 10 mg daily. Monitor with increased activity. Review secondary stroke prevention with patient.  4. Elevated hemoglobin A1c: Sugars at this point are essentially within normal limits. Impressed the importance of appropriate diet  5. Mood: Social worker to perform depression screen. Team to provide ego support as appropriate. Patient seems fairly upbeat at this time.   LOS (Days) 6 A FACE TO FACE EVALUATION WAS PERFORMED  Rhoda Waldvogel E 12/16/2011, 7:49 AM

## 2011-12-17 NOTE — Progress Notes (Addendum)
Occupational Therapy Session Note  Patient Details  Name: Craig [name] MRN: [id number] Date of Birth: 1951-02-06  Today's Date: 12/17/2011 Time: 4098-1191 and 4782-9562 Time Calculation (min): 60 min and 30 min  Short Term Goals: Week 1:  OT Short Term Goal 1 (Week 1): Pt will complete LB bathing with mod assist at sit to stand level OT Short Term Goal 2 (Week 1): Pt will complete UB dressing with min assist OT Short Term Goal 3 (Week 1): Pt will complete LB dressing with mod assist at sit to stand level OT Short Term Goal 4 (Week 1): Pt will complete stand pivot transfer with min assist and min verbal cues  Skilled Therapeutic Interventions/Progress Updates:    1) Pt seen for ADL retraining at sink level secondary to safety and use of mirror for visual cues with midline orientation and use of LUE with functional tasks.  Focus on sit <> stand, weight shifting in standing, use of visual tracking when using LUE to increase accuracy, and increased independence with LB dressing.  Pt continues to require +2 with LB dressing secondary to decreased LLE awareness and Lt lateral lean in standing.    2) 1:1 OT with focus on NM Re-ed seated edge of mat wit focus on Lt alien arm and grasping/releasing in sitting and standing.  Pt with increased LLE withdrawal and fatigue in standing this session and increased Lt lateral lean.  Pt with increased carryover of visually attending to Lt hand with reaching activities.  Pt min assist sit to stand, with mod to max tactile cues at Lt knee to maintain LLE extension and weightbearing.    Therapy Documentation Precautions:  Precautions Precautions: Fall Precaution Comments: L inattention and L alien arm, L field cut Restrictions Weight Bearing Restrictions: No Pain: Pain Assessment Pain Assessment: No/denies pain Pain Score: 0-No pain   See FIM for current functional status  Therapy/Group: Individual Therapy  [name] 12/17/2011, 12:16 PM

## 2011-12-17 NOTE — Progress Notes (Signed)
Physical Therapy Weekly Progress Note  Patient Details  Name: Craig [name] MRN: [id number] Date of Birth: 02/25/1951  Today's Date: 12/17/2011 Time: 7829-5621 Time Calculation (min): 58 min  Patient has made slow progress toward PT LTG and has met 2 of 4 short term goals.  Patient is currently max A for bed mobility, bed <> chair transfers to R and L, w/c mobility with R hemi-technique and max-total A for standing and gait training.    Patient continues to demonstrate the following deficits: impaired attention to L side, impaired awareness, significant apraxia, ataxia, L sided weakness, impaired muscle timing and sequencing and impaired sustained activation, impaired postural control, dynamic sitting and standing balance and gait and therefore will continue to benefit from skilled PT intervention to enhance overall performance with balance, postural control, ability to compensate for deficits, functional use of  left upper extremity and left lower extremity, attention, awareness and coordination.  Patient D/C date set for 01/04/12 home with wife and son supervision and min physical assistance.  Patient progressing toward long term goals..  Continue plan of care.  PT Short Term Goals Week 1:  PT Short Term Goal 1 (Week 1): Patient will perform bed mobility to L and R on flat bed with consistent mod A PT Short Term Goal 1 - Progress (Week 1): Progressing toward goal PT Short Term Goal 2 (Week 1): Patient will perform bed <> w/c transfers to L and R with consistent mod A and mod verbal cues for attention and placement of LLE/UE PT Short Term Goal 2 - Progress (Week 1): Progressing toward goal PT Short Term Goal 3 (Week 1): Patient will perform w/c mobility on unit x 150 with mod A with use of R hemi technique or bilat LE propulsion with increased attention to L side/environment PT Short Term Goal 3 - Progress (Week 1): Met PT Short Term Goal 4 (Week 1): Patient will perform standing balance, postural  control and pre-gait training bilat UE supported max A with focus on improved orientation to midline, weight shift and controlled advancement of LLE PT Short Term Goal 4 - Progress (Week 1): Met Week 2:  PT Short Term Goal 1 (Week 2): Patient will perform bed mobility to L and R on flat bed with consistent mod A  PT Short Term Goal 2 (Week 2): Patient will perform bed <> w/c transfers to L and R with consistent mod A and mod verbal cues for attention and placement of LLE/UE  PT Short Term Goal 3 (Week 2): Patient will perform w/c mobility in mildly distracting environment with improved attention to L side and obstacle negotiation with supervision and verbal cues for safety PT Short Term Goal 4 (Week 2): Patient will perform gait training in controlled environment x 50' with mod A with improved sustained activation and coordination of LLE  Therapy Documentation Precautions:  Precautions Precautions: Fall Precaution Comments: L inattention and L alien arm, L field cut Restrictions Weight Bearing Restrictions: No Pain: Pain Assessment Pain Assessment: No/denies pain Locomotion : Ambulation Ambulation/Gait Assistance: 1: +2 Total assist x 75' +75' beginning with UE around therapists' shoulders to facilitate upright trunk and sliding L foot forward to maintain contact with the floor with use of visual target for full step length and prevent scissoring and verbal cues for upright trunk and full step length with RLE to facilitate anterior translation of COG; second trial placed UE around therapists waists and coordinated each step and anterior weight shift with therapists with focus on maintaining  upright trunk and more consistent gait speed and use of momentum; still with verbal cues for ABD of LLE during advancement but this time with patient lifting LLE up off the ground for full swing phase. Other Treatments: Treatments Neuromuscular Facilitation: Left;Lower Extremity;Activity to increase motor  control;Activity to increase coordination;Activity to increase timing and sequencing;Activity to increase grading with L foot up on soccer ball performing controlled ball roll outs and in at specified distance and speed of movement forwards and backwards and side to side with mod A  See FIM for current functional status  Therapy/Group: Individual Therapy  Edman Circle [facility] 12/17/2011, 4:12 PM

## 2011-12-17 NOTE — Progress Notes (Signed)
Patient ID: Craig [name], male   DOB: 03-07-1951, 61 y.o.   MRN: [id number]  Subjective/Complaints: HPI: 61 year old right-handed African American male with history of hypertension admitted April 4 with left-sided weakness and facial droop with dizziness as well as episode of nausea vomiting. MRI of the brain showed acute right posterior cerebral artery infarction as well as acute infarct left superior cerebellar territory No new c/os  Review of Systems  HENT: Positive for hearing loss.     Objective: Vital Signs: Blood pressure 101/63, pulse 63, temperature 98.3 F (36.8 C), temperature source Oral, resp. rate 16, height 5\' 10"  (1.778 m), weight 92.6 kg (204 lb 2.3 oz), SpO2 97.00%. No results found. No results found for this or any previous visit (from the past 72 hour(s)).  Physical Exam  Nursing note and vitals reviewed.  Constitutional: He is oriented to person, place, and time. He appears well-developed.  HENT: Oral mucosa pink and moist.  Head: Normocephalic and atraumatic.  Eyes: Conjunctivae are normal. Pupils are equal, round, and reactive to light.  Neck: Normal range of motion. Neck supple. No thyromegaly present.  Cardiovascular: Normal rate. Regular rhythm without murmurs rubs or gallops.  Pulmonary/Chest: Effort normal and breath sounds normal. He has no wheezes, rales, rhonchi.  Abdominal: He exhibits no distension. There is no tenderness. Bowel sounds are positive  Musculoskeletal: He exhibits no edema.  Neurological: He is alert and oriented to person, place, and time.  Left visual field cut. His gaze seemed to be slightly disconjugate but he denied any diplopia. Patient with mild apraxia and decreased fine motor skills. Left arm and leg are quite ataxic.  He is left hemisensory loss severe  He has diminished attention to the left side as well today. Cognitively he is quite appropriate and pleasant. He has reduced insight and awareness. =  Skin: Skin is warm and dry.    Assessment/Plan: 1. 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 Physiatrist is providing close team supervision and 24 hour management of active medical problems listed below. Physiatrist and rehab team continue to assess barriers to discharge/monitor patient progress toward functional and medical goals. FIM: FIM - Bathing Bathing Steps Patient Completed: Chest;Right Arm;Left Arm;Abdomen;Front perineal area;Buttocks;Right upper leg;Left upper leg Bathing: 4: Min-Patient completes 8-9 80f 10 parts or 75+ percent  FIM - Upper Body Dressing/Undressing Upper body dressing/undressing steps patient completed: Put head through opening of pull over shirt/dress Upper body dressing/undressing: 2: Max-Patient completed 25-49% of tasks FIM - Lower Body Dressing/Undressing Lower body dressing/undressing steps patient completed: Thread/unthread right underwear leg;Thread/unthread right pants leg Lower body dressing/undressing: 1: Two helpers  FIM - Hotel manager Devices: Toilet Aid/prosthesis/orthosis Toileting: 1: Two helpers  FIM - Archivist Transfers: 1-Two helpers  FIM - Banker Devices: Arm rests Bed/Chair Transfer: 4: Supine > Sit: Min A (steadying Pt. > 75%/lift 1 leg);4: Sit > Supine: Min A (steadying pt. > 75%/lift 1 leg);4: Chair or W/C > Bed: Min A (steadying Pt. > 75%);4: Bed > Chair or W/C: Min A (steadying Pt. > 75%)  FIM - Locomotion: Wheelchair Distance: 150 Locomotion: Wheelchair: 2: Travels 150 ft or more: maneuvers on rugs and over door sills with maximal assistance (Pt: 25 - 49%) FIM - Locomotion: Ambulation Locomotion: Ambulation Assistive Devices: Sara Plus Ambulation/Gait Assistance: 1: +1 Total assist Locomotion: Ambulation: 0: Activity did not  occur  Comprehension  Comprehension Mode: Auditory Comprehension: 4-Understands basic 75 - 89% of the time/requires cueing 10 - 24% of the time  Expression Expression Mode: Verbal Expression: 5-Expresses basic needs/ideas: With no assist  Social Interaction Social Interaction: 6-Interacts appropriately with others with medication or extra time (anti-anxiety, antidepressant).  Problem Solving Problem Solving: 3-Solves basic 50 - 74% of the time/requires cueing 25 - 49% of the time  Memory Memory Mode: Asleep Memory: 3-Recognizes or recalls 50 - 74% of the time/requires cueing 25 - 49% of the time   Medical Problem List and Plan:  1. 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  2.. DVT Prophylaxis/Anticoagulation: SCDs and ambulation. Consider screening Dopplers  3. Hypertension. Norvasc 10 mg daily, hydrochlorothiazide 25 mg daily, Avapro 300 mg daily, bystolic 10 mg daily. Monitor with increased activity. Review secondary stroke prevention with patient.  4. Elevated hemoglobin A1c: Sugars at this point are essentially within normal limits. Impressed the importance of appropriate diet  5. Mood: Team to provide ego support as appropriate. Patient seems fairly upbeat at this time.   LOS (Days) 7 A FACE TO FACE EVALUATION WAS PERFORMED  Jeronimo Hellberg E 12/17/2011, 7:15 AM

## 2011-12-17 NOTE — Progress Notes (Signed)
Speech Language Pathology Daily Session Note  Patient Details  Name: Craig [name] MRN: [id number] Date of Birth: [birth year]  Today's Date: 12/17/2011 Time: 2440-1027 Time Calculation (min): 45 min  Short Term Goals: Week 1: SLP Short Term Goal 1 (Week 1): Patient will attend to left of environment with modeate assist sematnic and visual cues SLP Short Term Goal 2 (Week 1): Patient will request help as needed with moderate assist semantic cues SLP Short Term Goal 3 (Week 1): Patient will maintain topic of conversation for 4 turns with minimal assist seamtnic cues SLP Short Term Goal 4 (Week 1): Patient will attend to the left durign reading and writign tasks with moderate assist sematnic and visual cues  Skilled Therapeutic Interventions:  Therapy took place during lunch meal concentrating on attending to the left side of tray/environment.  Pt. required mod intermittent question cues to locate and reposition left hand and mod assist for tray set up.  Min-mod verbal cues to see and initiate eating food on the left side of tray.  He demonstrated appropriate topic maintenance during conversation.  Mod-max assist given to open and read his mail.  Daily Session Precautions/Restrictions  Restrictions Weight Bearing Restrictions: No FIM:  Comprehension Comprehension: 5-Understands basic 90% of the time/requires cueing < 10% of the time Expression Expression: 5-Expresses basic needs/ideas: With extra time/assistive device Social Interaction Social Interaction: 6-Interacts appropriately with others with medication or extra time (anti-anxiety, antidepressant). 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

## 2011-12-17 NOTE — Plan of Care (Signed)
Problem: RH BOWEL ELIMINATION Goal: RH STG MANAGE BOWEL W/MEDICATION W/ASSISTANCE STG Manage Bowel with Medication with min assist  Outcome: Progressing LBM 4/15  Comments:  LBM 4/15 (SEE DOC FLOW)

## 2011-12-18 DIAGNOSIS — I6330 Cerebral infarction due to thrombosis of unspecified cerebral artery: Secondary | ICD-10-CM

## 2011-12-18 DIAGNOSIS — Z5189 Encounter for other specified aftercare: Secondary | ICD-10-CM

## 2011-12-18 NOTE — Progress Notes (Signed)
Occupational Therapy Weekly Progress Note and Session Note  Patient Details  Name: Craig [name] MRN: [id number] Date of Birth: 1951/06/30  Today's Date: 12/18/2011 Time: 6962-9528 and 1400-1430 Time Calculation (min): 60 min and 30 min  Patient has met 2 of 4 short term goals.  Pt is progressing towards transfer goals.  He has progressed from max assist +2 to mod assist +1 to Lt and occasional min assist +1 to Rt.  Pt is showing progress with body awareness in sitting and standing with use of visual cues from mirror.  Pt is demonstrating carryover of techniques taught in previous sessions with using vision to compensate for Alien arm/decreased sensation and proprioception.  Pt continues to have increased difficulty with UB dressing with sequencing, problem solving, and ?perceptual deficits.  Patient continues to demonstrate the following deficits: Lt lateral lean in standing, Lt inattention, Lt field cut, sensory and proprioception deficits in LUE and LLE and therefore will continue to benefit from skilled OT intervention to enhance overall performance with BADL, iADL and Reduce care partner burden.  Patient progressing toward long term goals..  Continue plan of care.  OT Short Term Goals Week 1:  OT Short Term Goal 1 (Week 1): Pt will complete LB bathing with mod assist at sit to stand level OT Short Term Goal 1 - Progress (Week 1): Met OT Short Term Goal 2 (Week 1): Pt will complete UB dressing with min assist OT Short Term Goal 2 - Progress (Week 1): Progressing toward goal OT Short Term Goal 3 (Week 1): Pt will complete LB dressing with mod assist at sit to stand level OT Short Term Goal 3 - Progress (Week 1): Met OT Short Term Goal 4 (Week 1): Pt will complete stand pivot transfer with min assist and min verbal cues OT Short Term Goal 4 - Progress (Week 1): Progressing toward goal Week 2:  OT Short Term Goal 1 (Week 2): Pt will complete UB dressing with min assist and min verbal cues for  setup and sequencing OT Short Term Goal 2 (Week 2): Pt will complete squat pivot transfer with min assist and min verbal cues 2/3 sessions OT Short Term Goal 3 (Week 2): Pt will complete LB dressing with min assist with AE prn (elastic shoelaces) OT Short Term Goal 4 (Week 2): Pt will complete 2 of 3 toileting steps 2/3 sessions  Skilled Therapeutic Interventions/Progress Updates:    1) Pt seen for ADL retraining at sink level with use of mirror for visual cues with midline orientation, tracking to Lt to obtain items, and attention to LUE with bathing and dressing.  Engaged in LB dressing training with backward chaining with socks and discussion regarding AE for shoes (shoe button or elastic laces).  Pt demonstrated improved ability to problem solve LB dressing with min cues but continues to require mod cues and setup for UB dressing to be successful with still mod-max assist.  Pt demonstrated improved sit to stand this session and squat pivot transfer to Lt with mod assist +1.  2) 1:1 OT with focus on NM re-ed with weightbearing through LUE to focus on decreased alien arm and weight shifting over it with reaching activity.  Addressed visual deficits with Lt inattention and field cut with [facility] activity with RUE with pegboard and encouraging pt to cross midline to Lt to obtain items and scan for all holes in board. Also engaged in clock drawing and tracing circles with some perseveration on circle in Rt lower corner and head  cocked approx 30 degrees to Lt.  Therapy Documentation Precautions:  Precautions Precautions: Fall Precaution Comments: Lt inattention, Lt alien arm, Lt field cut Restrictions Weight Bearing Restrictions: No Pain: Pain Assessment Pain Assessment: No/denies pain Pain Score: 0-No pain ADL: ADL Grooming: Setup;Minimal cueing;Minimal assistance Where Assessed-Grooming: Sitting at sink Upper Body Bathing: Setup;Minimal cueing Where Assessed-Upper Body Bathing: Sitting at  sink Lower Body Bathing: Setup;Minimal cueing;Minimal assistance Where Assessed-Lower Body Bathing: Standing at sink;Sitting at sink Upper Body Dressing: Maximal assistance;Moderate cueing Where Assessed-Upper Body Dressing: Sitting at sink Lower Body Dressing: Moderate assistance Where Assessed-Lower Body Dressing: Sitting at sink;Standing at sink Toilet Transfer: Moderate assistance Toilet Transfer Method: Squat pivot Tub/Shower Transfer: Not assessed Film/video editor: Not assessed ADL Comments: Pt benefits from use of mirror with bathing and dressing for visual cues to accomodate for visual field cut and decreased sensation and proprioception in LUE.  See FIM for current functional status  Therapy/Group: Individual Therapy  [name] 12/18/2011, 11:17 AM

## 2011-12-18 NOTE — Progress Notes (Signed)
Speech Language Pathology Daily Session Note  Patient Details  Name: Craig [name] MRN: [id number] Date of Birth: Jun 30, 1951  Today's Date: 12/18/2011 Time: 1100-1158 Time Calculation (min): 58 min  Short Term Goals: Week 1: SLP Short Term Goal 1 (Week 1): Patient will attend to left of environment with modeate assist sematnic and visual cues SLP Short Term Goal 2 (Week 1): Patient will request help as needed with moderate assist semantic cues SLP Short Term Goal 3 (Week 1): Patient will maintain topic of conversation for 4 turns with minimal assist seamtnic cues SLP Short Term Goal 4 (Week 1): Patient will attend to the left durign reading and writign tasks with moderate assist sematnic and visual cues  Skilled Therapeutic Interventions: Pt maintained topic maintenance in basic conversations re: his son and how he met his wife for 5-6 turns with minimal verbal cues. He attended to the left side of the environment to read clock and calendar with moderate visual and verbal cues, with 60% success. Pt required consistent verbal cues to maintain left arm on his lap/chest during therapy. Pt required consistent moderate verbal cues to request help during noon meal. He required moderate visual and verbal and tactile cues to attend to left side reading money amounts on sheet and writing the amount out in words.   Daily Session Precautions/Restrictions  Precautions Precautions: Fall Precaution Comments: Lt inattention, Lt alien arm, Lt field cut FIM:  Comprehension Comprehension Mode: Auditory Comprehension: 4-Understands basic 75 - 89% of the time/requires cueing 10 - 24% of the time Expression Expression Mode: Verbal Expression: 5-Expresses basic 90% of the time/requires cueing < 10% of the time. Social Interaction Social Interaction: 6-Interacts appropriately with others with medication or extra time (anti-anxiety, antidepressant). Problem Solving Problem Solving: 3-Solves basic 50 - 74% of  the time/requires cueing 25 - 49% of the time Memory Memory: 3-Recognizes or recalls 50 - 74% of the time/requires cueing 25 - 49% of the time FIM - Eating Eating Activity: 5: Needs verbal cues/supervision General    Pain Pain Assessment Pain Assessment: No/denies pain Pain Score: 0-No pain       Therapy/Group: Individual Therapy  Craig [name], Radene Journey 12/18/2011, 1:11 PM

## 2011-12-18 NOTE — Progress Notes (Signed)
Physical Therapy Session Note  Patient Details  Name: Craig [name] MRN: [id number] Date of Birth: [birth year]  Today's Date: 12/18/2011 Time: 1300-1400 Time Calculation (min): 60 min  Short Term Goals: Week 1:  PT Short Term Goal 1 (Week 1): Patient will perform bed mobility to L and R on flat bed with consistent mod A PT Short Term Goal 1 - Progress (Week 1): Progressing toward goal PT Short Term Goal 2 (Week 1): Patient will perform bed <> w/c transfers to L and R with consistent mod A and mod verbal cues for attention and placement of LLE/UE PT Short Term Goal 2 - Progress (Week 1): Progressing toward goal PT Short Term Goal 3 (Week 1): Patient will perform w/c mobility on unit x 150 with mod A with use of R hemi technique or bilat LE propulsion with increased attention to L side/environment PT Short Term Goal 3 - Progress (Week 1): Met PT Short Term Goal 4 (Week 1): Patient will perform standing balance, postural control and pre-gait training bilat UE supported max A with focus on improved orientation to midline, weight shift and controlled advancement of LLE PT Short Term Goal 4 - Progress (Week 1): Met Week 2:  PT Short Term Goal 1 (Week 2): Patient will perform bed mobility to L and R on flat bed with consistent mod A  PT Short Term Goal 2 (Week 2): Patient will perform bed <> w/c transfers to L and R with consistent mod A and mod verbal cues for attention and placement of LLE/UE  PT Short Term Goal 3 (Week 2): Patient will perform w/c mobility in mildly distracting environment with improved attention to L side and obstacle negotiation with supervision and verbal cues for safety PT Short Term Goal 4 (Week 2): Patient will perform gait training in controlled environment x 50' with mod A with improved sustained activation and coordination of LLE  Therapy Documentation Precautions:  Precautions Precautions: Fall Precaution Comments: Lt inattention, Lt alien arm, Lt field  cut Restrictions Weight Bearing Restrictions: No Pain: Pain Assessment Pain Assessment: No/denies pain Locomotion : Pre-gait training with shoe sensor with alarm as auditory feedback for full WB on LLE and use of EVA for UE support during R and L lateral weight shifts and cues for activation of LLE extensors for stability in stance. Ambulation Ambulation: Yes Ambulation/Gait Assistance: 1: +2 Total assist Ambulation Distance (Feet): 140 Feet Assistive device: Eva walker Ambulation/Gait Assistance Details: Manual facilitation for weight shifting;Manual facilitation for placement;Verbal cues for gait pattern;Verbal cues for sequencing;Tactile cues for posture Ambulation/Gait Assistance Details (indicate cue type and reason): Use of auditory shoe on L foot for auditory feedback of weight shift and WB on LLE during gait with EVA walker for more upright trunk posture with verbal cues for upright trunk and for alternating stepping sequence to maintain momentum and continuous stepping and for attention to L foot placement to prevent scissoring.    Other Treatments: Treatments Neuromuscular Facilitation: Left;Lower Extremity;Upper Extremity;Forced use;Activity to increase coordination;Activity to increase motor control;Activity to increase timing and sequencing;Activity to increase grading;Activity to increase sustained activation;Activity to increase lateral weight shifting;Activity to increase anterior-posterior weight shifting in sitting with LUE in WB position on mat and LLE performing controlled ball roll outs and side to side in continuous pattern and then random pattern.  R and L sidelying on elbow without LE supported during contralateral UE reaching forward in various directions and across midline for increased pelvic and trunk activation for lateral flexion and elongation, and  in standing with EVA walker with focus on R lateral weight shifts during LLE toe taps to specific target and L lateral  weight shifts, weight acceptance and sustained activation during RLE toe taps all with max-+2 A for stability  See FIM for current functional status  Therapy/Group: Individual Therapy  Edman Circle [facility] 12/18/2011, 5:08 PM

## 2011-12-18 NOTE — Progress Notes (Signed)
Patient ID: Craig [name], male   DOB: [birth year], 61 y.o.   MRN: [id number] Patient ID: Craig [name], male   DOB: 18-Nov-1950, 61 y.o.   MRN: [id number]  Subjective/Complaints: No complaints. Likes therapies. Appetite good. Review of Systems  HENT: Positive for hearing loss.     Objective: Vital Signs: Blood pressure 107/69, pulse 63, temperature 97.9 F (36.6 C), temperature source Oral, resp. rate 17, height 5\' 10"  (1.778 m), weight 90.7 kg (199 lb 15.3 oz), SpO2 100.00%. No results found. No results found for this or any previous visit (from the past 72 hour(s)).  Physical Exam  Nursing note and vitals reviewed.  Constitutional: He is oriented to person, place, and time. He appears well-developed.  HENT: Oral mucosa pink and moist.  Head: Normocephalic and atraumatic.  Eyes: Conjunctivae are normal. Pupils are equal, round, and reactive to light.  Neck: Normal range of motion. Neck supple. No thyromegaly present.  Cardiovascular: Normal rate. Regular rhythm without murmurs rubs or gallops.  Pulmonary/Chest: Effort normal and breath sounds normal. He has no wheezes, rales, rhonchi.  Abdominal: He exhibits no distension. There is no tenderness. Bowel sounds are positive  Musculoskeletal: He exhibits no edema.  Neurological: He is alert and oriented to person, place, and time.  Left visual field cut.Patient with mild apraxia and decreased fine motor skills. Left arm and leg are quite ataxic.  He is left hemisensory loss severe  He has diminished attention to the left side as well today. Cognitively he is quite appropriate and pleasant. Insight and awareness are fair.  Skin: Skin is warm and dry.   Assessment/Plan: 1. 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 Physiatrist is providing close team supervision and 24 hour management of active medical problems  listed below. Physiatrist and rehab team continue to assess barriers to discharge/monitor patient progress toward functional and medical goals. FIM: FIM - Bathing Bathing Steps Patient Completed: Chest;Right Arm;Left Arm;Abdomen;Front perineal area;Right upper leg;Left upper leg;Right lower leg (including foot);Left lower leg (including foot) Bathing: 4: Min-Patient completes 8-9 38f 10 parts or 75+ percent  FIM - Upper Body Dressing/Undressing Upper body dressing/undressing steps patient completed: Put head through opening of pull over shirt/dress Upper body dressing/undressing: 2: Max-Patient completed 25-49% of tasks FIM - Lower Body Dressing/Undressing Lower body dressing/undressing steps patient completed: Thread/unthread right pants leg;Don/Doff right shoe Lower body dressing/undressing: 1: Two helpers  FIM - Hotel manager Devices: Toilet Aid/prosthesis/orthosis Toileting: 1: Two helpers  FIM - Archivist Transfers: 1-Two helpers  FIM - Banker Devices: Arm rests Bed/Chair Transfer: 3: Bed > Chair or W/C: Mod A (lift or lower assist);3: Chair or W/C > Bed: Mod A (lift or lower assist)  FIM - Locomotion: Wheelchair Distance: 150 Locomotion: Wheelchair: 1: Total Assistance/staff pushes wheelchair (Pt<25%) FIM - Locomotion: Ambulation Locomotion: Ambulation Assistive Devices: Sara Plus Ambulation/Gait Assistance: 1: +2 Total assist Locomotion: Ambulation: 1: Two helpers  Comprehension Comprehension Mode: Auditory Comprehension: 5-Understands complex 90% of the time/Cues < 10% of the time  Expression Expression Mode: Verbal Expression: 5-Expresses complex 90% of the time/cues < 10% of the time  Social Interaction Social Interaction: 6-Interacts appropriately with others with medication or extra time (anti-anxiety, antidepressant).  Problem Solving Problem Solving: 4-Solves basic 75 - 89% of the  time/requires cueing 10 - 24% of the time  Memory Memory Mode: Asleep Memory: 3-Recognizes or recalls  50 - 74% of the time/requires cueing 25 - 49% of the time   Medical Problem List and Plan:  1. 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  2.. DVT Prophylaxis/Anticoagulation: SCDs and ambulation.  3. Hypertension. Norvasc 10 mg daily, hydrochlorothiazide 25 mg daily, Avapro 300 mg daily, bystolic 10 mg daily. BP well controlled. Avoid hypotension  4. Elevated hemoglobin A1c: Sugars at this point are essentially within normal limits. 5. Mood: pt very upbeat.   LOS (Days) 8 A FACE TO FACE EVALUATION WAS PERFORMED  Samyria Rudie T 12/18/2011, 7:18 AM

## 2011-12-19 DIAGNOSIS — I6330 Cerebral infarction due to thrombosis of unspecified cerebral artery: Secondary | ICD-10-CM

## 2011-12-19 DIAGNOSIS — I69993 Ataxia following unspecified cerebrovascular disease: Secondary | ICD-10-CM

## 2011-12-19 DIAGNOSIS — Z5189 Encounter for other specified aftercare: Secondary | ICD-10-CM

## 2011-12-19 MED ORDER — IRBESARTAN 150 MG PO TABS
150.0000 mg | ORAL_TABLET | Freq: Every day | ORAL | Status: DC
  Filled 2011-12-19: qty 1

## 2011-12-19 MED ORDER — IRBESARTAN 150 MG PO TABS
150.0000 mg | ORAL_TABLET | Freq: Every day | ORAL | Status: DC
  Administered 2011-12-19 – 2012-01-04 (×17): 150 mg via ORAL
  Filled 2011-12-19 (×18): qty 1

## 2011-12-19 NOTE — Progress Notes (Signed)
Occupational Therapy Session Note  Patient Details  Name: Craig [name] MRN: [id number] Date of Birth: 1951/05/31  Today's Date: 12/19/2011 Time: 7829-5621 and 1435-1500 Time Calculation (min): 60 min and 25 min  Short Term Goals: Week 2:  OT Short Term Goal 1 (Week 2): Pt will complete UB dressing with min assist and min verbal cues for setup and sequencing OT Short Term Goal 2 (Week 2): Pt will complete squat pivot transfer with min assist and min verbal cues 2/3 sessions OT Short Term Goal 3 (Week 2): Pt will complete LB dressing with min assist with AE prn (elastic shoelaces) OT Short Term Goal 4 (Week 2): Pt will complete 2 of 3 toileting steps 2/3 sessions  Skilled Therapeutic Interventions/Progress Updates:    1) Pt seen for ADL retraining with focus on squat pivot transfers, sit <> stand, and use of visual cues with bathing and dressing to compensate for decreased sensation and proprioception.  Pt with increased ability to demonstrate use of mirror for carryover of education with midline orientation in sitting and standing and visual cues for use of LUE with bathing.  Pt demonstrating increased problem solving with attempting alternative ways to ensure washcloth stays in Lt hand during bathing.    2) 1:1 OT with focus on sit to stand, standing balance, weight bearing through LUE, and use of LUE with grasping and reaching activity.  Pt progressed from min assist sit to stand to close supervision during session (in controlled environment).  Focus on placement of LUE on high-low table and weightbearing through LUE to decrease Alien arm movements during activity.  Reaching activity with challenging balance by reaching outside BOS and re-obtaining midline throughout activity.  Attempted reaching activity with LUE with grasping large checkers and using vision to compensate for decreased sensation to ensure grasp on object intact.    Therapy Documentation Precautions:  Precautions Precautions:  Fall Precaution Comments: Lt inattention, Lt alien arm, Lt field cut Restrictions Weight Bearing Restrictions: No Pain:   No c/o pain this session  See FIM for current functional status  Therapy/Group: Individual Therapy  [name] 12/19/2011, 10:31 AM

## 2011-12-19 NOTE — Progress Notes (Signed)
Speech Language Pathology Daily Session Note & Weekly Progress Note  Patient Details  Name: Craig [name] MRN: [id number] Date of Birth: 13-Dec-1950  Today's Date: 12/19/2011 Time: 1130-1230 Time Calculation (min): 60 min  Short Term Goals: Week 1: SLP Short Term Goal 1 (Week 1): Patient will attend to left of environment with modeate assist sematnic and visual cues SLP Short Term Goal 2 (Week 1): Patient will request help as needed with moderate assist semantic cues SLP Short Term Goal 3 (Week 1): Patient will maintain topic of conversation for 4 turns with minimal assist seamtnic cues SLP Short Term Goal 4 (Week 1): Patient will attend to the left durign reading and writign tasks with moderate assist sematnic and visual cues  Skilled Therapeutic Interventions: Session focused on attention to left of environment and demonstrated basic problem solving with minimal assist semantic cues and required mod-max assist to recognize when he needed assistance completing a basic task.  SLP also facilitated session with max assist to utilize placement strategy for left upper extremity during self feeding task.   Daily Session FIM:  Comprehension Comprehension Mode: Auditory Comprehension: 5-Follows basic conversation/direction: With extra time/assistive device Expression Expression Mode: Verbal Expression: 5-Expresses complex 90% of the time/cues < 10% of the time Social Interaction Social Interaction: 6-Interacts appropriately with others with medication or extra time (anti-anxiety, antidepressant). Problem Solving Problem Solving: 4-Solves basic 75 - 89% of the time/requires cueing 10 - 24% of the time Memory Memory: 3-Recognizes or recalls 50 - 74% of the time/requires cueing 25 - 49% of the time FIM - Eating Eating Activity: 5: Supervision/cues;5: Set-up assist for open containers;5: Set-up assist for cut food Pain Pain Assessment Pain Assessment: No/denies pain Pain Score: 0-No  pain  Therapy/Group: Individual Therapy   Speech Language Pathology Weekly Progress Note  Patient Details  Name: Craig [name] MRN: [id number] Date of Birth: 06-Aug-1951  Today's Date: 12/19/2011  Short Term Goals: Week 1: SLP Short Term Goal 1 (Week 1): Patient will attend to left of environment with modeate assist sematnic and visual cues SLP Short Term Goal 1 - Progress (Week 1): Met SLP Short Term Goal 2 (Week 1): Patient will request help as needed with moderate assist semantic cues SLP Short Term Goal 2 - Progress (Week 1): Progressing toward goal SLP Short Term Goal 3 (Week 1): Patient will maintain topic of conversation for 4 turns with minimal assist seamtnic cues SLP Short Term Goal 3 - Progress (Week 1): Met SLP Short Term Goal 4 (Week 1): Patient will attend to the left durign reading and writign tasks with moderate assist sematnic and visual cues SLP Short Term Goal 4 - Progress (Week 4): Met Week 2: SLP Short Term Goal 1 (Week 2): Patient will attend to left of environment with supervision semantic cues SLP Short Term Goal 2 (Week 2): Patient will attend to left with reading and writing tasks with minimal assist semantic cues SLP Short Term Goal 3 (Week 2): Patient will request help as needed with moderate assist semantic cues SLP Short Term Goal 4 (Week 2): Patient will utilize compensatory strategies for safe placement of left upper extremity to avoid encountering objects with max assist semantic cues  Weekly Progress Updates: Patient met 3 out of 4 short term goals this reporting period  With gains in ability to maintain topic of conversation, attend to left of environment as well as written text.  Patient continues to demonstrate significant difficulty with awareness and recognizing when he needs assistance with basic tasks.  As a result he would continue to benefit from skilled SLP services to maximize his ability to make safe choices upon discharge and reduce burden of  care.   Fae Pippin, M.A., CCC-SLP [phone]  Craig [name] 12/19/2011, 3:20 PM

## 2011-12-19 NOTE — Patient Care Conference (Signed)
Inpatient RehabilitationTeam Conference Note Date: 12/19/2011   Time: 11:05 AM    Patient Name: Craig [name]      Medical Record Number: [id number]  Date of Birth: 1951/02/24 Sex: Male         Room/Bed: 4033/4033-01 Payor Info: Payor: [employer]  Plan: [place] [facility]  Product Type: *No Product type*     Admitting Diagnosis: B CVA  Admit Date/Time:  12/10/2011  6:10 PM Admission Comments: No comment available   Primary Diagnosis:  Stroke Principal Problem: Stroke  Patient Active Problem List  Diagnoses Date Noted  . 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 Stroke 12/11/2011  . CVA (cerebral infarction) 12/06/2011  . Hypertension, accelerated 05/15/2011    Expected Discharge Date: Expected Discharge Date: 01/04/12  Team Members Present: Physician: Dr. Claudette Laws Case Manager Present: Lutricia Horsfall, RN Social Worker Present: Dossie Der, LCSW Nurse Present: Gregor Hams, RN PT Present: Edman Circle, PT;Caroline Adriana Simas, PT OT Present: [name], Felipa Eth, OT Perrin Maltese, OT) SLP Present: Fae Pippin, SLP     Current Status/Progress Goal Weekly Team Focus  Medical   Absent sensation LUE, bladder inc improving  maintain bladder cont  cont condom cath   Bowel/Bladder   incontinent bowel/bladder, wears brief, lbm 4/15  continent bowel/bladder  q2h timed toileting   Swallow/Nutrition/ Hydration   regular and thin with intermittent staff supervision for min-mod assist cues to self feed  supervision cues   increase use of compensatory strategies: left scanning, initiating and problem solving    ADL's   min-mod A squat pivot transfer, min A bathing, max A UB dsg, mod A LB dsg  supervision overall  Lt body/environment attention, transfers, toilet and tub/shower transfers   Mobility   mod A bed mobility, basic transfers, w/c mobility; +2 A standing, gait  supervision-min A overall   Standing balance, coordination,  gait   Communication             Safety/Cognition/ Behavioral Observations  mod assist   minimal assist  increase awareness, initiation, self monitoring and correcting   Pain   no complaints of pain  < 3 on 1-10 scale  monitor   Skin   scab left elbow with allevyn  no new breakdown  monitor qshift and prn      *See Interdisciplinary Assessment and Plan and progress notes for long and short-term goals  Barriers to Discharge: multiple CVA with severe deficits    Possible Resolutions to Barriers:  Cont rehab    Discharge Planning/Teaching Needs:  Home with wife who is taking a FMLA to provide care to pt      Team Discussion:  Slow steady progress. BP controlled. Pt using condom cath at night. Continent with timed toileting during day.  Revisions to Treatment Plan:     Continued Need for Acute Rehabilitation Level of Care: The patient requires daily medical management by a physician with specialized training in physical medicine and rehabilitation for the following conditions: Daily direction of a multidisciplinary physical rehabilitation program to ensure safe treatment while eliciting the highest outcome that is of practical value to the patient.: Yes Daily medical management of patient stability for increased activity during participation in an intensive rehabilitation regime.: Yes Daily analysis of laboratory values and/or radiology reports with any subsequent need for medication adjustment of medical intervention for : Neurological problems  Meryl Dare 12/19/2011, 3:31 PM

## 2011-12-19 NOTE — Progress Notes (Signed)
Physical Therapy Session Note  Patient Details  Name: Craig [name] MRN: [id number] Date of Birth: [birth year]  Today's Date: 12/19/2011 Time: 4098-1191 Time Calculation (min): 53 min  Short Term Goals: Week 1:  PT Short Term Goal 1 (Week 1): Patient will perform bed mobility to L and R on flat bed with consistent mod A PT Short Term Goal 1 - Progress (Week 1): Progressing toward goal PT Short Term Goal 2 (Week 1): Patient will perform bed <> w/c transfers to L and R with consistent mod A and mod verbal cues for attention and placement of LLE/UE PT Short Term Goal 2 - Progress (Week 1): Progressing toward goal PT Short Term Goal 3 (Week 1): Patient will perform w/c mobility on unit x 150 with mod A with use of R hemi technique or bilat LE propulsion with increased attention to L side/environment PT Short Term Goal 3 - Progress (Week 1): Met PT Short Term Goal 4 (Week 1): Patient will perform standing balance, postural control and pre-gait training bilat UE supported max A with focus on improved orientation to midline, weight shift and controlled advancement of LLE PT Short Term Goal 4 - Progress (Week 1): Met Week 2:  PT Short Term Goal 1 (Week 2): Patient will perform bed mobility to L and R on flat bed with consistent mod A  PT Short Term Goal 2 (Week 2): Patient will perform bed <> w/c transfers to L and R with consistent mod A and mod verbal cues for attention and placement of LLE/UE  PT Short Term Goal 3 (Week 2): Patient will perform w/c mobility in mildly distracting environment with improved attention to L side and obstacle negotiation with supervision and verbal cues for safety PT Short Term Goal 4 (Week 2): Patient will perform gait training in controlled environment x 50' with mod A with improved sustained activation and coordination of LLE  Therapy Documentation Precautions:  Precautions Precautions: Fall Precaution Comments: Lt inattention, Lt alien arm, Lt field  cut Restrictions Weight Bearing Restrictions: No Pain: Pain Assessment Pain Assessment: No/denies pain Pain Score: 0-No pain Patients Stated Pain Goal: 3 Multiple Pain Sites: No  Other Treatments: Treatments Therapeutic Activity: Supine to sit from hospital bed with verbal cues for initiation and min A to attend to LLE advancement to EOB; transfers bed > w/c to L side with stand pivot with max A secondary to L lean and LOB; transfers w/c <> mat with squat pivots and squat scoots to L and R with assistance for L hand placement and verbal cues for sequence.  Sitting edge of mat had patient sequence putting shoes on each foot with min-mod A to stabilize L shoe; verbal cues for sequence for LLE.   Neuromuscular Facilitation: Left;Upper Extremity;Lower Extremity;Forced use;Activity to increase coordination;Activity to increase motor control;Activity to increase grading;Activity to increase sustained activation;Activity to increase lateral weight shifting;Activity to increase anterior-posterior weight shifting in quadruped for bilat UE and LE WB for increased proprioceptive input with focus on attention to LUE position, WB and muscle activation in static position, full WB with RUE elevated off the mat, during graded movements in various directions and during dynamic alternating advancement of UE and LE (forwards and retro crawling); seated edge of mat with focus on quieting LUE during purposeful movements and grading smaller movements  See FIM for current functional status  Therapy/Group: Individual Therapy  Edman Circle [facility] 12/19/2011, 4:52 PM

## 2011-12-19 NOTE — Progress Notes (Signed)
Patient ID: Craig [name], male   DOB: 01-Apr-1951, 61 y.o.   MRN: [id number]  Subjective/Complaints: No complaints. Needed supp for BM a couple days ago. Review of Systems  HENT: Positive for hearing loss.     Objective: Vital Signs: Blood pressure 108/69, pulse 76, temperature 97.9 F (36.6 C), temperature source Oral, resp. rate 17, height 5\' 10"  (1.778 m), weight 93.8 kg (206 lb 12.7 oz), SpO2 99.00%. No results found. No results found for this or any previous visit (from the past 72 hour(s)).  Physical Exam  Nursing note and vitals reviewed.  Constitutional: He is oriented to person, place, and time. He appears well-developed.  HENT: Oral mucosa pink and moist.  Head: Normocephalic and atraumatic.  Eyes: Conjunctivae are normal. Pupils are equal, round, and reactive to light.  Neck: Normal range of motion. Neck supple. No thyromegaly present.  Cardiovascular: Normal rate. Regular rhythm without murmurs rubs or gallops.  Pulmonary/Chest: Effort normal and breath sounds normal. He has no wheezes, rales, rhonchi.  Abdominal: He exhibits no distension. There is no tenderness. Bowel sounds are positive  Musculoskeletal: He exhibits no edema.  Neurological: He is alert and oriented to person, place, and time.  Left visual field cut.Patient with mild apraxia and decreased fine motor skills. Left arm and leg are quite ataxic.  He is left hemisensory loss severe  He has diminished attention to the left side as well today. Cognitively he is quite appropriate and pleasant. Insight and awareness are fair.  Skin: Skin is warm and dry.   Assessment/Plan: 1. 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 Physiatrist is providing close team supervision and 24 hour management of active medical problems listed below. Physiatrist and rehab team continue to assess barriers to  discharge/monitor patient progress toward functional and medical goals. FIM: FIM - Bathing Bathing Steps Patient Completed: Chest;Right Arm;Left Arm;Abdomen;Front perineal area;Buttocks;Right upper leg;Left upper leg;Right lower leg (including foot);Left lower leg (including foot) Bathing: 4: Steadying assist  FIM - Upper Body Dressing/Undressing Upper body dressing/undressing steps patient completed: Put head through opening of pull over shirt/dress;Thread/unthread right sleeve of pullover shirt/dresss Upper body dressing/undressing: 3: Mod-Patient completed 50-74% of tasks FIM - Lower Body Dressing/Undressing Lower body dressing/undressing steps patient completed: Thread/unthread right underwear leg;Thread/unthread left underwear leg;Thread/unthread right pants leg;Thread/unthread left pants leg;Don/Doff right shoe;Don/Doff left shoe Lower body dressing/undressing: 3: Mod-Patient completed 50-74% of tasks  FIM - Interior and spatial designer Assistive Devices: Toilet Aid/prosthesis/orthosis Toileting: 1: Two helpers  FIM - Archivist Transfers: 1-Two helpers  FIM - Banker Devices: Arm rests Bed/Chair Transfer: 3: Bed > Chair or W/C: Mod A (lift or lower assist);3: Chair or W/C > Bed: Mod A (lift or lower assist)  FIM - Locomotion: Wheelchair Distance: 150 Locomotion: Wheelchair: 1: Total Assistance/staff pushes wheelchair (Pt<25%) FIM - Locomotion: Ambulation Locomotion: Ambulation Assistive Devices: Fara Boros Ambulation/Gait Assistance: 1: +2 Total assist Locomotion: Ambulation: 1: Two helpers  Comprehension Comprehension Mode: Auditory Comprehension: 5-Follows basic conversation/direction: With extra time/assistive device  Expression Expression Mode: Verbal Expression: 5-Expresses basic needs/ideas: With extra time/assistive device  Social Interaction Social Interaction: 6-Interacts appropriately with others with medication  or extra time (anti-anxiety, antidepressant).  Problem Solving Problem Solving: 5-Solves basic problems: With no assist  Memory Memory Mode: Asleep Memory: 3-Recognizes or recalls 50 - 74% of the time/requires cueing 25 - 49% of the time  Medical Problem List and Plan:  1. 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  2.. DVT Prophylaxis/Anticoagulation: SCDs and ambulation.  3. Hypertension. Norvasc 10 mg daily, hydrochlorothiazide 25 mg daily, Avapro 300 mg daily, bystolic 10 mg daily. BP well controlled. Avoid hypotension  4. Elevated hemoglobin A1c: Sugars at this point are essentially within normal limits. 5. Mood: pt very upbeat.   LOS (Days) 9 A FACE TO FACE EVALUATION WAS PERFORMED  Theoren Palka E 12/19/2011, 8:20 AM

## 2011-12-20 DIAGNOSIS — Z5189 Encounter for other specified aftercare: Secondary | ICD-10-CM

## 2011-12-20 DIAGNOSIS — I6330 Cerebral infarction due to thrombosis of unspecified cerebral artery: Secondary | ICD-10-CM

## 2011-12-20 DIAGNOSIS — I69993 Ataxia following unspecified cerebrovascular disease: Secondary | ICD-10-CM

## 2011-12-20 NOTE — Progress Notes (Signed)
Speech Language Pathology Daily Session Note  Patient Details  Name: Craig [name] MRN: [id number] Date of Birth: 1950-10-26  Today's Date: 12/20/2011 Time: 1130-1230 Time Calculation (min): 60 min  Short Term Goals: Week 2: SLP Short Term Goal 1 (Week 2): Patient will attend to left of environment with supervision semantic cues SLP Short Term Goal 2 (Week 2): Patient will attend to left with reading and writing tasks with minimal assist semantic cues SLP Short Term Goal 3 (Week 2): Patient will request help as needed with moderate assist semantic cues SLP Short Term Goal 4 (Week 2): Patient will utilize compensatory strategies for safe placement of left upper extremity to avoid encountering objects with max assist semantic cues  Skilled Therapeutic Interventions: Session focused on skilled treatment of cognition with focus on selective attention, left attention and problem solving with self feeding.  SLP facilitated session with set up assist and minimal assist semantic cues to locate items on the left such as silverware and broccoli.  Patient requested help initially with max assist semantic cues however cues were faded and by end of session, x1 with only subtle cuing.  Patient utilized placement strategy of left upper extremity with max assist semantic and tactile cues throughout session.    Daily Session Precautions/Restrictions    FIM:  Comprehension Comprehension Mode: Auditory Comprehension: 6-Follows complex conversation/direction: With extra time/assistive device Expression Expression Mode: Verbal Expression: 5-Expresses complex 90% of the time/cues < 10% of the time Social Interaction Social Interaction: 6-Interacts appropriately with others with medication or extra time (anti-anxiety, antidepressant). Problem Solving Problem Solving: 4-Solves basic 75 - 89% of the time/requires cueing 10 - 24% of the time Memory Memory: 3-Recognizes or recalls 50 - 74% of the time/requires  cueing 25 - 49% of the time FIM - Eating Eating Activity: 5: Supervision/cues;5: Set-up assist for open containers;5: Set-up assist for cut food General    Pain Pain Assessment Pain Assessment: No/denies pain Pain Score: 0-No pain  Therapy/Group: Individual Therapy  Charlane Ferretti., CCC-SLP [phone]  [name] 12/20/2011, 4:39 PM

## 2011-12-20 NOTE — Progress Notes (Signed)
Clinical update faxed to [facility] at [facility].

## 2011-12-20 NOTE — Progress Notes (Signed)
Occupational Therapy Session Note  Patient Details  Name: Craig [name] MRN: [id number] Date of Birth: Nov 03, 1950  Today's Date: 12/20/2011 Time: 5621-3086 and 1315-1400 and 1400-1430  Time Calculation (min): 60 min and 45 min and 15 min  Short Term Goals: Week 2:  OT Short Term Goal 1 (Week 2): Pt will complete UB dressing with min assist and min verbal cues for setup and sequencing OT Short Term Goal 2 (Week 2): Pt will complete squat pivot transfer with min assist and min verbal cues 2/3 sessions OT Short Term Goal 3 (Week 2): Pt will complete LB dressing with min assist with AE prn (elastic shoelaces) OT Short Term Goal 4 (Week 2): Pt will complete 2 of 3 toileting steps 2/3 sessions  Skilled Therapeutic Interventions/Progress Updates:    1) Pt seen for ADL retraining at sink level with focus on squat pivot transfers, sit <> stand, standing balance, and use of LUE with functional tasks.  Pt with increased standing balance this session requiring min-mod tactile cues at Lt hip and Lt knee for support; pt responding well to verbal cues for balance and Lt weight bearing.  Pt with increased use of LUE to complete bathing of Rt arm and legs.  Issued pt elastic shoelaces to decrease caregiver burden with having to tie shoes.  2) 1:1 OT with focus on NM re-ed in standing with focus on sit <> stand, standing balance, weight shifting, weight bearing through LUE on wall, and use of LUE with reaching.  Engaged in wall pushups with support on Lt hand to maintain contact with the wall. Weight bearing through LUE on wall while reaching for numbers in varying visual planes to promote scanning to Lt.  Pt demonstrating increased compensation with visual scanning and tracking LUE with eyes.  3) Co-treat with PT with focus on weight bearing in quadruped to address initiation, sequencing, and motor planning with movements.  Pt required support at Lt elbow to maintain weightbearing in quadruped and occasional cues  for visual attention to compensate for lack of sensation and proprioception.  Therapy Documentation Precautions:  Precautions Precautions: Fall Precaution Comments: Lt inattention, Lt alien arm, Lt field cut Restrictions Weight Bearing Restrictions: No Pain: Pain Assessment Pain Score: 0-No pain  See FIM for current functional status  Therapy/Group: Individual Therapy  [name] 12/20/2011, 12:19 PM

## 2011-12-20 NOTE — Progress Notes (Signed)
Patient ID: Craig [name], male   DOB: 01/26/1951, 61 y.o.   MRN: [id number]  Subjective/Complaints: No complaints. Review of Systems  HENT: Positive for hearing loss.     Objective: Vital Signs: Blood pressure 119/74, pulse 66, temperature 97.9 F (36.6 C), temperature source Oral, resp. rate 17, height 5\' 10"  (1.778 m), weight 95.4 kg (210 lb 5.1 oz), SpO2 97.00%. No results found. No results found for this or any previous visit (from the past 72 hour(s)).  Physical Exam  Nursing note and vitals reviewed.  Constitutional: He is oriented to person, place, and time. He appears well-developed.  HENT: Oral mucosa pink and moist.  Head: Normocephalic and atraumatic.  Eyes: Conjunctivae are normal. Pupils are equal, round, and reactive to light.  Neck: Normal range of motion. Neck supple. No thyromegaly present.  Cardiovascular: Normal rate. Regular rhythm without murmurs rubs or gallops.  Pulmonary/Chest: Effort normal and breath sounds normal. He has no wheezes, rales, rhonchi.  Abdominal: He exhibits no distension. There is no tenderness. Bowel sounds are positive  Musculoskeletal: He exhibits no edema.  Neurological: He is alert and oriented to person, place, and time.  Left visual field cut.Patient with mild apraxia and decreased fine motor skills. Left arm and leg are quite ataxic.  He is left hemisensory loss severe  He has diminished attention to the left side as well today. Cognitively he is quite appropriate and pleasant. Insight and awareness are fair.  Skin: Skin is warm and dry.   Assessment/Plan: 1. 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 Physiatrist is providing close team supervision and 24 hour management of active medical problems listed below. Physiatrist and rehab team continue to assess barriers to discharge/monitor patient progress  toward functional and medical goals. FIM: FIM - Bathing Bathing Steps Patient Completed: Chest;Right Arm;Left Arm;Abdomen;Front perineal area;Buttocks;Right upper leg;Left upper leg;Right lower leg (including foot);Left lower leg (including foot) Bathing: 4: Steadying assist  FIM - Upper Body Dressing/Undressing Upper body dressing/undressing steps patient completed: Thread/unthread right sleeve of pullover shirt/dresss;Put head through opening of pull over shirt/dress Upper body dressing/undressing: 3: Mod-Patient completed 50-74% of tasks FIM - Lower Body Dressing/Undressing Lower body dressing/undressing steps patient completed: Thread/unthread left pants leg Lower body dressing/undressing: 1: Total-Patient completed less than 25% of tasks  FIM - Hotel manager Devices: Toilet Aid/prosthesis/orthosis Toileting: 1: Two helpers  FIM - Archivist Transfers: 1-Two helpers  FIM - Banker Devices: Arm rests Bed/Chair Transfer: 4: Supine > Sit: Min A (steadying Pt. > 75%/lift 1 leg);2: Bed > Chair or W/C: Max A (lift and lower assist);4: Chair or W/C > Bed: Min A (steadying Pt. > 75%)  FIM - Locomotion: Wheelchair Distance: 150 Locomotion: Wheelchair: 1: Total Assistance/staff pushes wheelchair (Pt<25%) FIM - Locomotion: Ambulation Locomotion: Ambulation Assistive Devices: Interior and spatial designer Ambulation/Gait Assistance: 1: +2 Total assist Locomotion: Ambulation: 0: Activity did not occur  Comprehension Comprehension Mode: Auditory Comprehension: 5-Follows basic conversation/direction: With extra time/assistive device  Expression Expression Mode: Verbal Expression: 5-Expresses complex 90% of the time/cues < 10% of the time  Social Interaction Social Interaction: 6-Interacts appropriately with others with medication or extra time (anti-anxiety, antidepressant).  Problem Solving Problem Solving: 4-Solves basic 75 - 89%  of the time/requires cueing 10 - 24% of the time  Memory Memory Mode: Asleep Memory: 3-Recognizes or recalls 50 - 74% of the time/requires cueing 25 -  49% of the time   Medical Problem List and Plan:  1. 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  2.. DVT Prophylaxis/Anticoagulation: SCDs and ambulation.  3. Hypertension. Norvasc 10 mg daily, hydrochlorothiazide 25 mg daily, Avapro 300 mg daily, bystolic 10 mg daily. BP well controlled. Avoid hypotension  4. Elevated hemoglobin A1c: Sugars at this point are essentially within normal limits. 5. Mood: pt very upbeat.   LOS (Days) 10 A FACE TO FACE EVALUATION WAS PERFORMED  KIRSTEINS,ANDREW E 12/20/2011, 9:13 AM

## 2011-12-20 NOTE — Care Management (Signed)
Met with pt yesterday after team conference and talked with pt's wife today by phone to report on team conference. Both in agreement with goals and d/c date of 5/3. Pt's wife plans to take FMLA and come in for education the week prior to d/c. She states she feels confident that she and her son can provide assistance.

## 2011-12-20 NOTE — Progress Notes (Signed)
Physical Therapy Session Note  Patient Details  Name: Craig [name] MRN: [id number] Date of Birth: April 15, 1951  Today's Date: 12/20/2011 Time: 1415-1500 (total time 1400-1500 first 30 min co-treat with OT) Time Calculation (min): 45 min  Short Term Goals: Week 1:  PT Short Term Goal 1 (Week 1): Patient will perform bed mobility to L and R on flat bed with consistent mod A PT Short Term Goal 1 - Progress (Week 1): Progressing toward goal PT Short Term Goal 2 (Week 1): Patient will perform bed <> w/c transfers to L and R with consistent mod A and mod verbal cues for attention and placement of LLE/UE PT Short Term Goal 2 - Progress (Week 1): Progressing toward goal PT Short Term Goal 3 (Week 1): Patient will perform w/c mobility on unit x 150 with mod A with use of R hemi technique or bilat LE propulsion with increased attention to L side/environment PT Short Term Goal 3 - Progress (Week 1): Met PT Short Term Goal 4 (Week 1): Patient will perform standing balance, postural control and pre-gait training bilat UE supported max A with focus on improved orientation to midline, weight shift and controlled advancement of LLE PT Short Term Goal 4 - Progress (Week 1): Met Week 2:  PT Short Term Goal 1 (Week 2): Patient will perform bed mobility to L and R on flat bed with consistent mod A  PT Short Term Goal 2 (Week 2): Patient will perform bed <> w/c transfers to L and R with consistent mod A and mod verbal cues for attention and placement of LLE/UE  PT Short Term Goal 3 (Week 2): Patient will perform w/c mobility in mildly distracting environment with improved attention to L side and obstacle negotiation with supervision and verbal cues for safety PT Short Term Goal 4 (Week 2): Patient will perform gait training in controlled environment x 50' with mod A with improved sustained activation and coordination of LLE  Therapy Documentation Precautions:  Precautions Precautions: Fall Precaution Comments: Lt  inattention, Lt alien arm, Lt field cut Restrictions Weight Bearing Restrictions: No Pain: Pain Assessment Pain Assessment: No/denies pain Pain Score: 0-No pain  Other Treatments:  NMR: Co-treat with OT with focus on weight bearing in quadruped and on elbows to address initiation, sequencing, graded UE and LE movements, lateral and anterior/posterior weight shifting for weight acceptance on LUE and LLE and motor planning with movements. Pt required support at L elbow to maintain weightbearing in quadruped and occasional cues for visual attention to compensate for lack of sensation and proprioception  Continued NMR in tall kneeling with bilat UE support on therapist's shoulders for upright trunk during lateral weight shifts, tall kneeling squats for activation of hip extensors and weight shifts with graded R and LLE forward, retro and lateral advancements; rest breaks needed secondary to increased fatigue and decreased ability to follow cues.  See FIM for current functional status  Therapy/Group: Individual Therapy and Co-Treatment  Edman Circle [facility] 12/20/2011, 5:01 PM

## 2011-12-21 NOTE — Progress Notes (Signed)
Physical Therapy Session Note  Patient Details  Name: Craig [name] MRN: [id number] Date of Birth: 04-12-1951  Today's Date: 12/21/2011 Time: 1300-1400 Time Calculation (min): 60 min  Short Term Goals: Week 1:  PT Short Term Goal 1 (Week 1): Patient will perform bed mobility to L and R on flat bed with consistent mod A PT Short Term Goal 1 - Progress (Week 1): Progressing toward goal PT Short Term Goal 2 (Week 1): Patient will perform bed <> w/c transfers to L and R with consistent mod A and mod verbal cues for attention and placement of LLE/UE PT Short Term Goal 2 - Progress (Week 1): Progressing toward goal PT Short Term Goal 3 (Week 1): Patient will perform w/c mobility on unit x 150 with mod A with use of R hemi technique or bilat LE propulsion with increased attention to L side/environment PT Short Term Goal 3 - Progress (Week 1): Met PT Short Term Goal 4 (Week 1): Patient will perform standing balance, postural control and pre-gait training bilat UE supported max A with focus on improved orientation to midline, weight shift and controlled advancement of LLE PT Short Term Goal 4 - Progress (Week 1): Met Week 2:  PT Short Term Goal 1 (Week 2): Patient will perform bed mobility to L and R on flat bed with consistent mod A  PT Short Term Goal 2 (Week 2): Patient will perform bed <> w/c transfers to L and R with consistent mod A and mod verbal cues for attention and placement of LLE/UE  PT Short Term Goal 3 (Week 2): Patient will perform w/c mobility in mildly distracting environment with improved attention to L side and obstacle negotiation with supervision and verbal cues for safety PT Short Term Goal 4 (Week 2): Patient will perform gait training in controlled environment x 50' with mod A with improved sustained activation and coordination of LLE  Therapy Documentation Precautions:  Precautions Precautions: Fall Precaution Comments: Lt inattention, Lt alien arm, Lt field  cut Restrictions Weight Bearing Restrictions: No Pain: Pain Assessment Pain Assessment: No/denies pain Locomotion : Ambulation Ambulation/Gait Assistance: 1: +2 Total assist with patient's UE around therapy staff x 25' x 2 forwards with use of visual feedback (green line on floor) to facilitate wider BOS and prevent scissoring; side stepping to L x 25' with +2A for increased activation of L hip ABD muscles and lateral weight shifting Exercises:  Nustep with bilat UE and LE at level 6 x 10 minutes to minimize tone through rhythmic alternating UE and LE coordinated movement and L sided strengthening and graded flexion <> extension. Other Treatments: Treatments Neuromuscular Facilitation: Left;Lower Extremity;Activity to increase coordination;Activity to increase motor control;Activity to increase timing and sequencing;Activity to increase grading;Activity to increase sustained activation;Activity to increase lateral weight shifting;Activity to increase anterior-posterior weight shifting in tall kneeling with bilat UE support with focus on R/L lateral weight shifting while maintaining upright trunk posture/hip extension and forward/retro and R/L lateral advancement of R and LLE  See FIM for current functional status  Therapy/Group: Individual Therapy  Edman Circle [facility] 12/21/2011, 3:05 PM

## 2011-12-21 NOTE — Progress Notes (Signed)
Patient ID: Kamaron Deskins, male   DOB: May 28, 1951, 61 y.o.   MRN: [id number]  Subjective/Complaints: No complaints. Review of Systems  HENT: Positive for hearing loss.     Objective: Vital Signs: Blood pressure 109/61, pulse 67, temperature 99.1 F (37.3 C), temperature source Oral, resp. rate 18, height 5\' 10"  (1.778 m), weight 95.4 kg (210 lb 5.1 oz), SpO2 100.00%. No results found. No results found for this or any previous visit (from the past 72 hour(s)).  Physical Exam  Nursing note and vitals reviewed.  Constitutional: He is oriented to person, place, and time. He appears well-developed.  HENT: Oral mucosa pink and moist.  Head: Normocephalic and atraumatic.  Eyes: Conjunctivae are normal. Pupils are equal, round, and reactive to light.  Neck: Normal range of motion. Neck supple. No thyromegaly present.  Cardiovascular: Normal rate. Regular rhythm without murmurs rubs or gallops.  Pulmonary/Chest: Effort normal and breath sounds normal. He has no wheezes, rales, rhonchi.  Abdominal: He exhibits no distension. There is no tenderness. Bowel sounds are positive  Musculoskeletal: He exhibits no edema.  Neurological: He is alert and oriented to person, place, and time.  Left visual field cut.Patient with mild apraxia and decreased fine motor skills. Left arm and leg are quite ataxic.  He is left hemisensory loss severe  He has diminished attention to the left side as well today. Cognitively he is quite appropriate and pleasant. Insight and awareness are fair.  Skin: Skin is warm and dry.   Assessment/Plan: 1. 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 Physiatrist is providing close team supervision and 24 hour management of active medical problems listed below. Physiatrist and rehab team continue to assess barriers to discharge/monitor patient progress  toward functional and medical goals. FIM: FIM - Bathing Bathing Steps Patient Completed: Chest;Right Arm;Left Arm;Abdomen;Front perineal area;Buttocks;Right upper leg;Left upper leg Bathing: 4: Min-Patient completes 8-9 5f 10 parts or 75+ percent  FIM - Upper Body Dressing/Undressing Upper body dressing/undressing steps patient completed: Thread/unthread right sleeve of pullover shirt/dresss;Put head through opening of pull over shirt/dress Upper body dressing/undressing: 3: Mod-Patient completed 50-74% of tasks FIM - Lower Body Dressing/Undressing Lower body dressing/undressing steps patient completed: Thread/unthread right underwear leg;Thread/unthread left underwear leg;Thread/unthread right pants leg;Don/Doff left shoe;Don/Doff right shoe Lower body dressing/undressing: 3: Mod-Patient completed 50-74% of tasks Counsellor)  FIM - Toileting Toileting Assistive Devices: Toilet Aid/prosthesis/orthosis Toileting: 1: Two helpers  FIM - Archivist Transfers: 1-Two helpers  FIM - Banker Devices: Arm rests Bed/Chair Transfer: 3: Bed > Chair or W/C: Mod A (lift or lower assist);3: Chair or W/C > Bed: Mod A (lift or lower assist)  FIM - Locomotion: Wheelchair Distance: 150 Locomotion: Wheelchair: 1: Total Assistance/staff pushes wheelchair (Pt<25%) FIM - Locomotion: Ambulation Locomotion: Ambulation Assistive Devices: Fara Boros Ambulation/Gait Assistance: 1: +2 Total assist Locomotion: Ambulation: 0: Activity did not occur  Comprehension Comprehension Mode: Auditory Comprehension: 5-Understands complex 90% of the time/Cues < 10% of the time  Expression Expression Mode: Verbal Expression: 3-Expresses basic 50 - 74% of the time/requires cueing 25 - 50% of the time. Needs to repeat parts of sentences.  Social Interaction Social Interaction: 4-Interacts appropriately 75 - 89% of the time - Needs redirection for appropriate  language or to initiate interaction.  Problem Solving Problem Solving: 3-Solves basic 50 - 74% of the time/requires cueing 25 - 49% of  the time  Memory Memory Mode: Asleep Memory: 3-Recognizes or recalls 50 - 74% of the time/requires cueing 25 - 49% of the time   Medical Problem List and Plan:  1. 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  2.. DVT Prophylaxis/Anticoagulation: SCDs and ambulation.  3. Hypertension. Norvasc 10 mg daily, hydrochlorothiazide 25 mg daily, Avapro 300 mg daily, bystolic 10 mg daily. BP well controlled. Avoid hypotension  4. Elevated hemoglobin A1c: Sugars at this point are essentially within normal limits. 5. Mood: pt very upbeat.   LOS (Days) 11 A FACE TO FACE EVALUATION WAS PERFORMED  Dore Oquin E 12/21/2011, 8:59 AM

## 2011-12-21 NOTE — Progress Notes (Signed)
OccupationalTherapy Note  Patient Details  Name: Craig [name] MRN: [id number] Date of Birth: 1951-06-29 Today's Date: 12/21/2011 Time:  4098-1191  (40 min) Group Therapy Pain:  None  Engaged in [employer] with focus on using right upper extremity as stabilizer  (max assist), looking to left, attending to left environment.  Pt.'s LUE managered better with less ataxic movements after deep tissue myotherapy to relax forearm and shouler.    Humberto Seals 12/21/2011, 12:58 PM

## 2011-12-21 NOTE — Progress Notes (Signed)
Speech Language Pathology Daily Session Note  Patient Details  Name: Craig [name] MRN: [id number] Date of Birth: 08-20-1951  Today's Date: 12/21/2011 Time: 1130-1145 Time Calculation (min): 15 min  Short Term Goals: Week 2: SLP Short Term Goal 1 (Week 2): Patient will attend to left of environment with supervision semantic cues SLP Short Term Goal 2 (Week 2): Patient will attend to left with reading and writing tasks with minimal assist semantic cues SLP Short Term Goal 3 (Week 2): Patient will request help as needed with moderate assist semantic cues SLP Short Term Goal 4 (Week 2): Patient will utilize compensatory strategies for safe placement of left upper extremity to avoid encountering objects with max assist semantic cues  Skilled Therapeutic Interventions: Session focused on problem solving and requesting help as needed during lunch. Patient recalled people and events from yesterday with minimal assist semantic cues; required minimal assist semantic cues to scan to the left of environment during meal and required moderate assist semantic cues to request help as needed.  Daily Session Precautions/Restrictions    FIM:  Comprehension Comprehension Mode: Auditory Comprehension: 5-Understands complex 90% of the time/Cues < 10% of the time Expression Expression Mode: Verbal Expression: 5-Expresses complex 90% of the time/cues < 10% of the time Social Interaction Social Interaction: 6-Interacts appropriately with others with medication or extra time (anti-anxiety, antidepressant). Problem Solving Problem Solving: 3-Solves basic 50 - 74% of the time/requires cueing 25 - 49% of the time Memory Memory: 4-Recognizes or recalls 75 - 89% of the time/requires cueing 10 - 24% of the time FIM - Eating Eating Activity: 5: Supervision/cues;5: Set-up assist for open containers;5: Set-up assist for cut food General    Pain Pain Assessment Pain Assessment: No/denies pain Pain Score: 0-No  pain  Therapy/Group: Group Therapy  Charlane Ferretti., CCC-SLP [phone]  [name] 12/21/2011, 4:30 PM

## 2011-12-21 NOTE — Progress Notes (Signed)
Occupational Therapy Session Note  Patient Details  Name: Craig [name] MRN: [id number] Date of Birth: 12/27/1950  Today's Date: 12/21/2011 Time: 0930-1030 and 1415-1500 Time Calculation (min): 60 min and 45 min  Short Term Goals: Week 2:  OT Short Term Goal 1 (Week 2): Pt will complete UB dressing with min assist and min verbal cues for setup and sequencing OT Short Term Goal 2 (Week 2): Pt will complete squat pivot transfer with min assist and min verbal cues 2/3 sessions OT Short Term Goal 3 (Week 2): Pt will complete LB dressing with min assist with AE prn (elastic shoelaces) OT Short Term Goal 4 (Week 2): Pt will complete 2 of 3 toileting steps 2/3 sessions  Skilled Therapeutic Interventions/Progress Updates:    1) Pt seen for ADL retraining at sink.  Focus on bed mobility, squat pivot transfer, sit to stand, attention to Lt body and environment during BADL, and use of vision to accommodate for decreased sensation and proprioception in LUE.  Pt demonstrating increased safety and decreased assist with transfers.  Pt requires increased time for dressing secondary to ?perceptual deficits, motor planning, and decreased initiation.  Pt demonstrated increased LUE control with UB bathing this session with visual cues from mirror.  2) 1:1 OT with focus on NM re-ed with sit <> stand, standing balance, balance reactions, and attention to Lt environment.  Engaged in horse shoe tossing activity in standing with RUE with reaching in various planes to encourage visual scanning to locate horse shoe and balance reactions when reaching outside BOS.  Pt required mod-max tactile cues in standing to maintain or re-obtain midline.  Use of visual cues beneficial to compensate at this time.  Therapy Documentation Precautions:  Precautions Precautions: Fall Precaution Comments: Lt inattention, Lt alien arm, Lt field cut Restrictions Weight Bearing Restrictions: No General:   Vital Signs: Therapy Vitals BP:  118/60 mmHg Pain:   Pt with no c/o pain this session.  See FIM for current functional status  Therapy/Group: Individual Therapy  [name] 12/21/2011, 11:44 AM

## 2011-12-22 NOTE — Progress Notes (Signed)
Physical Therapy Session Note  Patient Details  Name: Craig [name] MRN: [id number] Date of Birth: [birth year]  Today's Date: 12/22/2011 Time: 4098-1191 Time Calculation (min): 28 min  Short Term Goals: Week 1:  PT Short Term Goal 1 (Week 1): Patient will perform bed mobility to L and R on flat bed with consistent mod A PT Short Term Goal 1 - Progress (Week 1): Progressing toward goal PT Short Term Goal 2 (Week 1): Patient will perform bed <> w/c transfers to L and R with consistent mod A and mod verbal cues for attention and placement of LLE/UE PT Short Term Goal 2 - Progress (Week 1): Progressing toward goal PT Short Term Goal 3 (Week 1): Patient will perform w/c mobility on unit x 150 with mod A with use of R hemi technique or bilat LE propulsion with increased attention to L side/environment PT Short Term Goal 3 - Progress (Week 1): Met PT Short Term Goal 4 (Week 1): Patient will perform standing balance, postural control and pre-gait training bilat UE supported max A with focus on improved orientation to midline, weight shift and controlled advancement of LLE PT Short Term Goal 4 - Progress (Week 1): Met Therapy Documentation Precautions:  Precautions Precautions: Fall Precaution Comments: Lt inattention, Lt alien arm, Lt field cut Restrictions Weight Bearing Restrictions: No  Pain: No c/o pain Skilled Therapeutic Interventions/Progress Updates:  Gait training: Worked on side stepping to the left then amb. Forward using rail in hall and facilitation for left foot placement. +2 Assist with pt.=70%. 48ftx3. Progressed to HHA+2 using "balance beam" on flow to help decrease scissoring gait 82ftx3. Pt=60%.   See FIM for current functional status  Therapy/Group: Individual Therapy   Hortencia Conradi, PTA 12/22/2011, 4:14 PM

## 2011-12-22 NOTE — Progress Notes (Signed)
Patient ID: Craig [name], male   DOB: February 06, 1951, 61 y.o.   MRN: [id number] Patient ID: Craig [name], male   DOB: 08-Oct-1950, 61 y.o.   MRN: [id number]  Subjective/Complaints:  4/20.  Alert offering no complaints. Examination revealed clear lung fields. Cardiovascular exam normal S1-S2 without tachycardia Abdomen soft nondistended no tenderness. Extremities- left HP; SCDs in place.   BP Readings from Last 3 Encounters:  12/22/11 136/86  12/10/11 115/73  07/18/11 130/90   Review of Systems  HENT: Positive for hearing loss.     Objective: Vital Signs: Blood pressure 136/86, pulse 67, temperature 97.9 F (36.6 C), temperature source Oral, resp. rate 16, height 5\' 10"  (1.778 m), weight 210 lb 5.1 oz (95.4 kg), SpO2 100.00%. No results found. No results found for this or any previous visit (from the past 72 hour(s)).  Physical Exam  Nursing note and vitals reviewed.  Constitutional: He is oriented to person, place, and time. He appears well-developed.  HENT: Oral mucosa pink and moist.  Head: Normocephalic and atraumatic.  Eyes: Conjunctivae are normal. Pupils are equal, round, and reactive to light.  Neck: Normal range of motion. Neck supple. No thyromegaly present.  Cardiovascular: Normal rate. Regular rhythm without murmurs rubs or gallops.  Pulmonary/Chest: Effort normal and breath sounds normal. He has no wheezes, rales, rhonchi.  Abdominal: He exhibits no distension. There is no tenderness. Bowel sounds are positive  Musculoskeletal: He exhibits no edema.  Neurological: He is alert and oriented to person, place, and time.  Left visual field cut.Patient with mild apraxia and decreased fine motor skills. Left arm and leg are quite ataxic.  He is left hemisensory loss severe  He has diminished attention to the left side as well today. Cognitively he is quite appropriate and pleasant. Insight and awareness are fair.  Skin: Skin is warm and dry.   Assessment/Plan: 1. 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 Physiatrist is providing close team supervision and 24 hour management of active medical problems listed below. Physiatrist and rehab team continue to assess barriers to discharge/monitor patient progress toward functional and medical goals. FIM: FIM - Bathing Bathing Steps Patient Completed: Chest;Right Arm;Left Arm;Abdomen;Front perineal area;Buttocks;Right upper leg;Left upper leg;Right lower leg (including foot);Left lower leg (including foot) Bathing: 4: Steadying assist  FIM - Upper Body Dressing/Undressing Upper body dressing/undressing steps patient completed: Thread/unthread left sleeve of pullover shirt/dress;Put head through opening of pull over shirt/dress Upper body dressing/undressing: 3: Mod-Patient completed 50-74% of tasks FIM - Lower Body Dressing/Undressing Lower body dressing/undressing steps patient completed: Thread/unthread right underwear leg;Thread/unthread left underwear leg;Thread/unthread right pants leg;Thread/unthread left pants leg;Don/Doff right shoe;Don/Doff left shoe Lower body dressing/undressing: 3: Mod-Patient completed 50-74% of tasks  FIM - Interior and spatial designer Assistive Devices: Toilet Aid/prosthesis/orthosis Toileting: 1: Two helpers  FIM - Archivist Transfers: 1-Two helpers  FIM - Banker Devices: Arm rests Bed/Chair Transfer: 4: Supine > Sit: Min A (steadying Pt. > 75%/lift 1 leg);4: Sit > Supine: Min A (steadying pt. > 75%/lift 1 leg);3: Bed > Chair or W/C: Mod A (lift or lower assist);3: Chair or W/C > Bed: Mod A (lift or lower assist)  FIM - Locomotion: Wheelchair Distance: 150 Locomotion: Wheelchair: 0: Activity did not occur FIM - Locomotion: Ambulation Locomotion: Ambulation Assistive Devices: Fara Boros Ambulation/Gait  Assistance: 1: +2 Total assist Locomotion: Ambulation: 1: Two helpers  Comprehension Comprehension  Mode: Auditory Comprehension: 5-Understands complex 90% of the time/Cues < 10% of the time  Expression Expression Mode: Verbal Expression: 5-Expresses complex 90% of the time/cues < 10% of the time  Social Interaction Social Interaction: 6-Interacts appropriately with others with medication or extra time (anti-anxiety, antidepressant).  Problem Solving Problem Solving: 3-Solves basic 50 - 74% of the time/requires cueing 25 - 49% of the time  Memory Memory Mode: Asleep Memory: 4-Recognizes or recalls 75 - 89% of the time/requires cueing 10 - 24% of the time   Medical Problem List and Plan:  1. 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  2.. DVT Prophylaxis/Anticoagulation: SCDs and ambulation.  3. Hypertension. Norvasc 10 mg daily, hydrochlorothiazide 25 mg daily, Avapro 300 mg daily, bystolic 10 mg daily. BP well controlled. Avoid hypotension  4. Elevated hemoglobin A1c: Sugars at this point are essentially within normal limits. 5. Mood: pt very upbeat.   LOS (Days) 12 A FACE TO FACE EVALUATION WAS PERFORMED  Rogelia Boga 12/22/2011, 10:43 AM

## 2011-12-22 NOTE — Progress Notes (Signed)
Speech Language Pathology Daily Session Note  Patient Details  Name: Craig [name] MRN: [id number] Date of Birth: October 01, 1950  Today's Date: 12/22/2011 Time: 1130-1230 Time Calculation (min): 60 min  Short Term Goals: Week 2:  SLP Short Term Goal 1 (Week 2): Patient will attend to left of environment with supervision semantic cues SLP Short Term Goal 2 (Week 2): Patient will attend to left with reading and writing tasks with minimal assist semantic cues SLP Short Term Goal 3 (Week 2): Patient will request help as needed with moderate assist semantic cues SLP Short Term Goal 4 (Week 2): Patient will utilize compensatory strategies for safe placement of left upper extremity to avoid encountering objects with max assist semantic cues  Skilled Therapeutic Interventions: Group treatment in basic self-feeding ADL and minmal verbal/visual cues for attending to and locating utensils/food in left field, moderate verbal redirection for basic selective and sustained attention throughout 60 minute session as well as maximum cues to request help, particularly with his left UE (decreased emergent awareness of left UE movement).  Daily Session Precautions/Restrictions    FIM:  Comprehension Comprehension Mode: Auditory Comprehension: 5-Understands complex 90% of the time/Cues < 10% of the time Expression Expression Mode: Verbal Expression: 5-Expresses complex 90% of the time/cues < 10% of the time Social Interaction Social Interaction: 6-Interacts appropriately with others with medication or extra time (anti-anxiety, antidepressant). Problem Solving Problem Solving: 3-Solves basic 50 - 74% of the time/requires cueing 25 - 49% of the time Memory Memory: 4-Recognizes or recalls 75 - 89% of the time/requires cueing 10 - 24% of the time FIM - Eating Eating Activity: 4: Help with managing cup/glass   Pain Pain Assessment Pain Assessment: No/denies pain  Therapy/Group: Group Therapy  Myra Rude, M.S.,CCC-SLP Pager [phone] 12/22/2011, 1:45 PM

## 2011-12-23 NOTE — Progress Notes (Signed)
Craig [name] is a 61 y.o. male  Subjective: No complaints - bright affect and outlook  Objective: Vital signs in last 24 hours: Temp:  [98.3 F (36.8 C)-98.5 F (36.9 C)] 98.5 F (36.9 C) (04/21 0500) Pulse Rate:  [61-68] 61  (04/21 0500) Resp:  [16-18] 18  (04/21 0500) BP: (118-128)/(65-76) 118/65 mmHg (04/21 0500) SpO2:  [99 %-100 %] 100 % (04/21 0500) Weight change:  Last BM Date: 12/21/11  Intake/Output from previous day: 04/20 0701 - 04/21 0700 In: 240 [P.O.:240] Out: 300 [Urine:300] Last cbgs: CBG (last 3)  No results found for this basename: GLUCAP:3 in the last 72 hours   Physical Exam General: No apparent distress     Lungs: Normal effort. Lungs clear to auscultation, no crackles or wheezes. Cardiovascular: Regular rate and rhythm, no edema Musculoskeletal:  Neurovascularly intact Neurological: No new neurological deficits. Left visual field cut. Patient with mild apraxia and decreased fine motor skills. Left arm >leg ataxia. He is left hemisensory loss severe- He has diminished attention to the left side. Wounds: N/A       Lab Results: No results found for this or any previous visit (from the past 24 hour(s)).   Studies/Results: No results found.  Medications: I have reviewed the patient's current medications.  Assessment: 1. 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  Physiatrist is providing close team supervision and 24 hour management of active medical problems listed below.  Physiatrist and rehab team continue to assess barriers to discharge/monitor patient progress toward functional and medical goals.  Plan: 1. 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  2.. DVT Prophylaxis/Anticoagulation: SCDs and ambulation.  3. Hypertension. Norvasc 10 mg  daily, hydrochlorothiazide 25 mg daily, Avapro 300 mg daily, bystolic 10 mg daily. BP well controlled. Avoid hypotension  4. Elevated hemoglobin A1c: Sugars at this point are essentially within normal limits.  5. Mood: pt very positive   Length of stay, days: 13  IRETON,SUSAN C , PA-C 12/23/2011, 11:01 AM  I agree with above.  Rene Paci, MD 12/23/2011, 11:01 AM

## 2011-12-24 DIAGNOSIS — I69993 Ataxia following unspecified cerebrovascular disease: Secondary | ICD-10-CM

## 2011-12-24 DIAGNOSIS — I6330 Cerebral infarction due to thrombosis of unspecified cerebral artery: Secondary | ICD-10-CM

## 2011-12-24 DIAGNOSIS — Z5189 Encounter for other specified aftercare: Secondary | ICD-10-CM

## 2011-12-24 NOTE — Progress Notes (Signed)
Occupational Therapy Session Note  Patient Details  Name: Craig [name] MRN: [id number] Date of Birth: [birth year]  Today's Date: 12/24/2011 Time: 1000-1055 and 1405-1505 Time Calculation (min): 55 min and 60 min  Short Term Goals: Week 2:  OT Short Term Goal 1 (Week 2): Pt will complete UB dressing with min assist and min verbal cues for setup and sequencing OT Short Term Goal 2 (Week 2): Pt will complete squat pivot transfer with min assist and min verbal cues 2/3 sessions OT Short Term Goal 3 (Week 2): Pt will complete LB dressing with min assist with AE prn (elastic shoelaces) OT Short Term Goal 4 (Week 2): Pt will complete 2 of 3 toileting steps 2/3 sessions  Skilled Therapeutic Interventions/Progress Updates:    Pt seen for ADL retraining at sink level with focus on Lt attention, Lt visual scanning, sit <> stand, standing balance, midline orientation in standing, and increased independence with LB dressing.  Pt was able to don socks this session with increased time and min cues.  Pt incontinent of urine in brief, able to complete thorough periarea care with min/steady assist in standing.  Pt reports socks and shoes most difficult part of dressing.  2) 1:1 OT with focus on NM re-ed in sitting, prone, weightbearing on all fours, and with floor transfers.  Focus of session on motor planning movements for whole body, sensation, proprioception, and body awareness with all movements. Engaged in stand pivot transfers and floor transfers without physical assistance only min guard for safety, to increase pt's awareness and concentration on each movement of his body.  Pt required min cues for Lt hand and leg placement on all fours for increased weightbearing in hands and elbows.  Pt with increased coordination of movements post weight bearing.  Therapy Documentation Precautions:  Precautions Precautions: Fall Precaution Comments: Lt inattention, Lt alien arm, Lt field cut Restrictions Weight  Bearing Restrictions: No General:   Vital Signs:   Pain: Pain Assessment Pain Assessment: No/denies pain Pain Score: 0-No pain  See FIM for current functional status  Therapy/Group: Individual Therapy  [name] 12/24/2011, 12:07 PM

## 2011-12-24 NOTE — Progress Notes (Signed)
Physical Therapy Weekly Progress Note  Patient Details  Name: Craig [name] MRN: [id number] Date of Birth: 06-01-1951  Today's Date: 12/24/2011 Time: 4098-1191 Time Calculation (min): 57 min  Patient continues to make steady progress towards PT LTG and has met 2 of 4 short term goals.  Patient has progressed to min A for bed mobility on flat surface, min-mod A for basic bed <> w/c transfers with mod verbal cues for sequence, mod-max A for sequencing R hemi technique w/c mobility and for attention to LUE/LLE and L environment, mod-max A for gait training in controlled environment.  Patient has progress from requiring total cues to requiring mod verbal, tactile and visual cues to attend to LUE and LLE positioning and control.  Stair training, car transfer training still to be addressed.    Patient continues to demonstrate the following deficits: L sided weakness, impaired sensation, proprioception, impaired motor control, timing, sequencing and grading of movements, impaired coordination, apraxia, ataxia, impaired attention to L side and L environment, impaired standing balance, postural control and gait and therefore will continue to benefit from skilled PT intervention to enhance overall performance with balance, postural control, ability to compensate for deficits, functional use of  left upper extremity and left lower extremity, attention, awareness and coordination.  Patient progressing toward long term goals..  Continue plan of care.  Patient D/C date set for 01/04/12 home with wife at supervision-min A level.  PT Short Term Goals Week 1:  PT Short Term Goal 1 (Week 1): Patient will perform bed mobility to L and R on flat bed with consistent mod A PT Short Term Goal 1 - Progress (Week 1): Progressing toward goal PT Short Term Goal 2 (Week 1): Patient will perform bed <> w/c transfers to L and R with consistent mod A and mod verbal cues for attention and placement of LLE/UE PT Short Term Goal 2 -  Progress (Week 1): Progressing toward goal PT Short Term Goal 3 (Week 1): Patient will perform w/c mobility on unit x 150 with mod A with use of R hemi technique or bilat LE propulsion with increased attention to L side/environment PT Short Term Goal 3 - Progress (Week 1): Met PT Short Term Goal 4 (Week 1): Patient will perform standing balance, postural control and pre-gait training bilat UE supported max A with focus on improved orientation to midline, weight shift and controlled advancement of LLE PT Short Term Goal 4 - Progress (Week 1): Met Week 2:  PT Short Term Goal 1 (Week 2): Patient will perform bed mobility to L and R on flat bed with consistent mod A  PT Short Term Goal 1 - Progress (Week 2): Met PT Short Term Goal 2 (Week 2): Patient will perform bed <> w/c transfers to L and R with consistent mod A and mod verbal cues for attention and placement of LLE/UE  PT Short Term Goal 2 - Progress (Week 2): Met PT Short Term Goal 3 (Week 2): Patient will perform w/c mobility in mildly distracting environment with improved attention to L side and obstacle negotiation with supervision and verbal cues for safety PT Short Term Goal 3 - Progress (Week 2): Progressing toward goal PT Short Term Goal 4 (Week 2): Patient will perform gait training in controlled environment x 50' with mod A with improved sustained activation and coordination of LLE PT Short Term Goal 4 - Progress (Week 2): Progressing toward goal Week 3:  PT Short Term Goal 1 (Week 3): Patient will perform w/c  mobility in mildly distracting environment with improved attention to L side and obstacle negotiation with supervision and verbal cues for safety PT Short Term Goal 2 (Week 3): Patient will perform gait training in controlled environment x 100' with mod A with improved sustained activation and coordination of LLE  PT Short Term Goal 3 (Week 3): Patient will perform bed mobility on flat bed/mat with supervision and bed <> w/c transfers  to L and R consistent min A  Therapy Documentation Precautions:  Precautions Precautions: Fall Precaution Comments: Lt inattention, Lt alien arm, Lt field cut Restrictions Weight Bearing Restrictions: No Pain: Pain Assessment Pain Assessment: No/denies pain Pain Score: 0-No pain Locomotion : Ambulation Ambulation/Gait Assistance: 1: +2 Total assist Ambulation Distance (Feet): 50 Feet Assistive device: Eva walker Ambulation/Gait Assistance Details: Manual facilitation for weight shifting;Manual facilitation for placement;Verbal cues for technique;Verbal cues for sequencing;Visual cues/gestures for sequencing;Tactile cues for posture Ambulation/Gait Assistance Details: Pre gait training with UE support on EVA walker during LLE toe taps to visual target for LLE advancement with wider BOS x 10 reps + 10 reps R toe taps to visual target with focus on L lateral weight shifting and activation of extensors for stability in stance; Patient noted not to shorten L trunk during swing phase and maintains R trunk lean; in sitting performed trunk shortening and elongation training in sitting propped on L elbow with manual facilitation for R pelvis depression<>elevation during L trunk shortening <> elongation.  Returned to gait training with EVA walker x 50' x 2 with use of green line or 2x4 plank on floor for visual feedback to maintain normal BOS, cues for upright trunk posture and intermittent cues to attend to LUE placement; patient demonstrating improved grading and placement of LLE during swing phase.  Performed L sidestepping with squat with bilat UE support at wall rail to focus on L hip ABD, lateral weight shifting and increased L knee control in stance   See FIM for current functional status  Therapy/Group: Individual Therapy  Edman Circle [facility] 12/24/2011, 4:36 PM

## 2011-12-24 NOTE — Progress Notes (Signed)
Occupational Therapy Note  Patient Details  Name: Craig [name] MRN: [id number] Date of Birth: 18-Oct-1950 Today's Date: 12/24/2011  Time: 1130-1145 Pt denies pain Group therapy  Pt participated in self feeding group with focus on compensatory strategies and LUE positioning.  Pt required assistance with cutting foods and opening containers.  Pt interacted appropriately with other participants.   Lavone Neri [facility] 12/24/2011, 3:37 PM

## 2011-12-24 NOTE — Progress Notes (Signed)
Speech Language Pathology Daily Session Note  Patient Details  Name: Craig [name] MRN: [id number] Date of Birth: [birth year]  Today's Date: 12/24/2011 Time: 4098-1191 Time Calculation (min): 35 min  Short Term Goals: Week 2: SLP Short Term Goal 1 (Week 2): Patient will attend to left of environment with supervision semantic cues SLP Short Term Goal 2 (Week 2): Patient will attend to left with reading and writing tasks with minimal assist semantic cues SLP Short Term Goal 3 (Week 2): Patient will request help as needed with moderate assist semantic cues SLP Short Term Goal 4 (Week 2): Patient will utilize compensatory strategies for safe placement of left upper extremity to avoid encountering objects with max assist semantic cues  Skilled Therapeutic Interventions: Session focused on cognition treatment; SLP facilitated session with minimal assist semantic cues and tactile cue x1 to utilize planting left upper extremity as a strategy during a self feeding task; patient required minimal assist sematnic cues to sequence tasks and moderate assist to scan to left of environment to locate items.    Daily Session Pain Pain Assessment Pain Assessment: No/denies pain Pain Score: 0-No pain  Therapy/Group: Group Therapy  Charlane Ferretti., CCC-SLP [phone]  Craig [name] 12/24/2011, 3:53 PM

## 2011-12-24 NOTE — Progress Notes (Signed)
Patient ID: Samarth Ogle, male   DOB: 11/16/1950, 61 y.o.   MRN: [id number]  Subjective/Complaints: No complaints.Viasiting with friends Review of Systems  HENT: Positive for hearing loss.     Objective: Vital Signs: Blood pressure 120/82, pulse 71, temperature 98.3 F (36.8 C), temperature source Oral, resp. rate 18, height 5\' 10"  (1.778 m), weight 95.4 kg (210 lb 5.1 oz), SpO2 100.00%. No results found. Results for orders placed during the hospital encounter of 12/10/11 (from the past 72 hour(s))  GLUCOSE, CAPILLARY     Status: Abnormal   Collection Time   12/23/11 11:52 AM      Component Value Range Comment   Glucose-Capillary 126 (*) 70 - 99 (mg/dL)    Comment 1 Notify RN       Physical Exam  Nursing note and vitals reviewed.  Constitutional: He is oriented to person, place, and time. He appears well-developed.  HENT: Oral mucosa pink and moist.  Head: Normocephalic and atraumatic.  Eyes: Conjunctivae are normal. Pupils are equal, round, and reactive to light.  Neck: Normal range of motion. Neck supple. No thyromegaly present.  Cardiovascular: Normal rate. Regular rhythm without murmurs rubs or gallops.  Pulmonary/Chest: Effort normal and breath sounds normal. He has no wheezes, rales, rhonchi.  Abdominal: He exhibits no distension. There is no tenderness. Bowel sounds are positive  Musculoskeletal: He exhibits no edema.  Neurological: He is alert and oriented to person, place, and time.  Left visual field cut.Patient with mild apraxia and decreased fine motor skills. Left arm and leg are quite ataxic.  He is left hemisensory loss severe  He has diminished attention to the left side as well today. Cognitively he is quite appropriate and pleasant. Insight and awareness are fair.  Skin: Skin is warm and dry.   Assessment/Plan: 1. 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 Physiatrist is providing close team supervision and 24 hour management of active medical problems listed below. Physiatrist and rehab team continue to assess barriers to discharge/monitor patient progress toward functional and medical goals. FIM: FIM - Bathing Bathing Steps Patient Completed: Chest;Right Arm;Left Arm;Abdomen;Front perineal area;Buttocks;Right upper leg;Left upper leg;Right lower leg (including foot);Left lower leg (including foot) Bathing: 4: Steadying assist  FIM - Upper Body Dressing/Undressing Upper body dressing/undressing steps patient completed: Thread/unthread right sleeve of pullover shirt/dresss;Thread/unthread left sleeve of pullover shirt/dress Upper body dressing/undressing: 3: Mod-Patient completed 50-74% of tasks FIM - Lower Body Dressing/Undressing Lower body dressing/undressing steps patient completed: Thread/unthread right pants leg;Thread/unthread left pants leg Lower body dressing/undressing: 3: Mod-Patient completed 50-74% of tasks  FIM - Toileting Toileting steps completed by patient: Adjust clothing prior to toileting Toileting Assistive Devices: Grab bar or rail for support Toileting: 2: Max-Patient completed 1 of 3 steps  FIM - Archivist Transfers: 3-To toilet/BSC: Mod A (lift or lower assist);3-From toilet/BSC: Mod A (lift or lower assist)  FIM - Press photographer Assistive Devices: Arm rests Bed/Chair Transfer: 3: Bed > Chair or W/C: Mod A (lift or lower assist);3: Chair or W/C > Bed: Mod A (lift or lower assist) (leans to left)  FIM - Locomotion: Wheelchair Distance: 150 Locomotion: Wheelchair: 1: Total Assistance/staff pushes wheelchair (Pt<25%) FIM - Locomotion: Ambulation Locomotion: Ambulation Assistive Devices: Fara Boros Ambulation/Gait Assistance: 1: +2 Total assist Locomotion: Ambulation: 1: Two helpers  Comprehension Comprehension Mode: Auditory Comprehension:  5-Follows basic conversation/direction: With no assist  Expression Expression Mode: Verbal Expression: 5-Expresses basic needs/ideas: With no assist  Social Interaction Social Interaction: 6-Interacts appropriately with others with medication or extra time (anti-anxiety, antidepressant).  Problem Solving Problem Solving: 4-Solves basic 75 - 89% of the time/requires cueing 10 - 24% of the time  Memory Memory Mode: Asleep Memory: 4-Recognizes or recalls 75 - 89% of the time/requires cueing 10 - 24% of the time   Medical Problem List and Plan:  1. 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  2.. DVT Prophylaxis/Anticoagulation: SCDs and ambulation.  3. Hypertension. Norvasc 10 mg daily, hydrochlorothiazide 25 mg daily, Avapro 300 mg daily, bystolic 10 mg daily. BP well controlled. Avoid hypotension  4. Elevated hemoglobin A1c: Sugars at this point are essentially within normal limits. 5. Mood: pt very upbeat.   LOS (Days) 14 A FACE TO FACE EVALUATION WAS PERFORMED  Kiasia Chou E 12/24/2011, 8:35 AM

## 2011-12-24 NOTE — Plan of Care (Signed)
Problem: RH BLADDER ELIMINATION Goal: RH STG MANAGE BLADDER WITH ASSISTANCE STG Manage Bladder With min assist  Outcome: Progressing Timed tolieting

## 2011-12-25 NOTE — Progress Notes (Signed)
Speech Language Pathology Daily Session Note  Patient Details  Name: Craig [name] MRN: [id number] Date of Birth: May 05, 1951  Today's Date: 12/25/2011 Time: 9147-8295 Time Calculation (min): 35 min  Short Term Goals: Week 2: SLP Short Term Goal 1 (Week 2): Patient will attend to left of environment with supervision semantic cues SLP Short Term Goal 2 (Week 2): Patient will attend to left with reading and writing tasks with minimal assist semantic cues SLP Short Term Goal 3 (Week 2): Patient will request help as needed with moderate assist semantic cues SLP Short Term Goal 4 (Week 2): Patient will utilize compensatory strategies for safe placement of left upper extremity to avoid encountering objects with max assist semantic cues  Skilled Therapeutic Interventions: Session focused on cognition with self feeding; SLP facilitated session with minimal assist semantic cues to attend to left of environment and sequence, problem solve with set up; Patient initiated requests for help x1, with overall moderate assist cues to recognize when he needed help.   Daily Session Precautions/Restrictions  Precautions Precautions: Fall Precaution Comments: Lt inattention, Lt alien arm, Lt field cut Pain Pain Assessment Pain Assessment: No/denies pain Pain Score: 0-No pain  Therapy/Group: Group Therapy  Charlane Ferretti., CCC-SLP [phone]  Miana Politte 12/25/2011, 4:27 PM

## 2011-12-25 NOTE — Progress Notes (Signed)
Occupational Therapy Note  Patient Details  Name: Craig [name] MRN: [id number] Date of Birth: 1951/02/18 Today's Date: 12/25/2011  Time: 1130-1145 Pt denies pain Group Therapy Pt participated in self feeding group with focus on LUE use and positioning, opening containers, portion management, and social interaction.  Pt requires assistance with cutting up food and opening containers and occasional LUE positioning/awareness.    Lavone Neri [facility] 12/25/2011, 1:10 PM

## 2011-12-25 NOTE — Progress Notes (Signed)
Patient ID: Craig [name], male   DOB: 02-05-1951, 61 y.o.   MRN: [id number]  Subjective/Complaints: No complaints. Review of Systems  HENT: Positive for hearing loss.     Objective: Vital Signs: Blood pressure 129/82, pulse 63, temperature 98.4 F (36.9 C), temperature source Oral, resp. rate 19, height 5\' 10"  (1.778 m), weight 95.4 kg (210 lb 5.1 oz), SpO2 100.00%. No results found. Results for orders placed during the hospital encounter of 12/10/11 (from the past 72 hour(s))  GLUCOSE, CAPILLARY     Status: Abnormal   Collection Time   12/23/11 11:52 AM      Component Value Range Comment   Glucose-Capillary 126 (*) 70 - 99 (mg/dL)    Comment 1 Notify RN       Physical Exam  Nursing note and vitals reviewed.  Constitutional: He is oriented to person, place, and time. He appears well-developed.  HENT: Oral mucosa pink and moist.  Head: Normocephalic and atraumatic.  Eyes: Conjunctivae are normal. Pupils are equal, round, and reactive to light.  Neck: Normal range of motion. Neck supple. No thyromegaly present.  Cardiovascular: Normal rate. Regular rhythm without murmurs rubs or gallops.  Pulmonary/Chest: Effort normal and breath sounds normal. He has no wheezes, rales, rhonchi.  Abdominal: He exhibits no distension. There is no tenderness. Bowel sounds are positive  Musculoskeletal: He exhibits no edema.  Neurological: He is alert and oriented to person, place, and time.  Left visual field cut.Patient with mild apraxia and decreased fine motor skills. Left arm and leg are quite ataxic.  He is left hemisensory loss severe  He has diminished attention to the left side as well today. Cognitively he is quite appropriate and pleasant. Insight and awareness are fair.  Skin: Skin is warm and dry.   Assessment/Plan: 1. 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 Physiatrist is providing close team supervision and 24 hour management of active medical problems listed below. Physiatrist and rehab team continue to assess barriers to discharge/monitor patient progress toward functional and medical goals. FIM: FIM - Bathing Bathing Steps Patient Completed: Chest;Right Arm;Left Arm;Abdomen;Front perineal area;Buttocks;Right upper leg;Left upper leg;Right lower leg (including foot);Left lower leg (including foot) Bathing: 4: Steadying assist  FIM - Upper Body Dressing/Undressing Upper body dressing/undressing steps patient completed: Thread/unthread right sleeve of pullover shirt/dresss;Thread/unthread left sleeve of pullover shirt/dress Upper body dressing/undressing: 3: Mod-Patient completed 50-74% of tasks FIM - Lower Body Dressing/Undressing Lower body dressing/undressing steps patient completed: Thread/unthread right underwear leg;Thread/unthread left underwear leg;Thread/unthread right pants leg;Thread/unthread left pants leg;Don/Doff right sock;Don/Doff left sock;Don/Doff right shoe Lower body dressing/undressing: 4: Min-Patient completed 75 plus % of tasks  FIM - Toileting Toileting steps completed by patient: Adjust clothing prior to toileting Toileting Assistive Devices: Grab bar or rail for support Toileting: 2: Max-Patient completed 1 of 3 steps  FIM - Archivist Transfers: 3-To toilet/BSC: Mod A (lift or lower assist);3-From toilet/BSC: Mod A (lift or lower assist)  FIM - Press photographer Assistive Devices: Arm rests Bed/Chair Transfer: 3: Bed > Chair or W/C: Mod A (lift or lower assist);3: Chair or W/C > Bed: Mod A (lift or lower assist)  FIM - Locomotion: Wheelchair Distance: 150 Locomotion: Wheelchair: 1: Total Assistance/staff pushes wheelchair (Pt<25%) FIM - Locomotion: Ambulation Locomotion: Ambulation Assistive Devices: Fara Boros Ambulation/Gait Assistance: 1: +2 Total  assist Locomotion: Ambulation: 1: Two helpers  Comprehension Comprehension  Mode: Auditory Comprehension: 5-Follows basic conversation/direction: With no assist  Expression Expression Mode: Verbal Expression: 5-Expresses basic needs/ideas: With extra time/assistive device  Social Interaction Social Interaction: 5-Interacts appropriately 90% of the time - Needs monitoring or encouragement for participation or interaction.  Problem Solving Problem Solving: 5-Solves complex 90% of the time/cues < 10% of the time  Memory Memory Mode: Asleep Memory: 4-Recognizes or recalls 75 - 89% of the time/requires cueing 10 - 24% of the time   Medical Problem List and Plan:  1. 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  2.. DVT Prophylaxis/Anticoagulation: SCDs and ambulation.  3. Hypertension. Norvasc 10 mg daily, hydrochlorothiazide 25 mg daily, Avapro 300 mg daily, bystolic 10 mg daily. BP well controlled. Avoid hypotension  4. Elevated hemoglobin A1c: Sugars at this point are essentially within normal limits. 5. Mood: pt very upbeat.   LOS (Days) 15 A FACE TO FACE EVALUATION WAS PERFORMED  Shaquil Aldana E 12/25/2011, 8:49 AM

## 2011-12-25 NOTE — Progress Notes (Signed)
Speech Language Pathology Daily Session Note  Patient Details  Name: Craig [name] MRN: [id number] Date of Birth: 11/16/1950  Today's Date: 12/25/2011 Time: 1415-1500 Time Calculation (min): 45 min  Short Term Goals: Week 2: SLP Short Term Goal 1 (Week 2): Patient will attend to left of environment with supervision semantic cues SLP Short Term Goal 2 (Week 2): Patient will attend to left with reading and writing tasks with minimal assist semantic cues SLP Short Term Goal 3 (Week 2): Patient will request help as needed with moderate assist semantic cues SLP Short Term Goal 4 (Week 2): Patient will utilize compensatory strategies for safe placement of left upper extremity to avoid encountering objects with max assist semantic cues  Skilled Therapeutic Interventions: Sessio focused on recalling procedure or a new task and alternating attention between 3 different factors during task; SLP facilitated session with moderate assist semantic cues faded to minimal assist cues by end of session.  Patient demonstrated attend to left of environment with increased wait time.   Daily Session Precautions/Restrictions  Precautions Precautions: Fall Precaution Comments: Lt inattention, Lt alien arm, Lt field cut FIM:  Comprehension Comprehension Mode: Auditory Comprehension: 5-Follows basic conversation/direction: With no assist Expression Expression Mode: Verbal Expression: 5-Expresses basic needs/ideas: With extra time/assistive device Social Interaction Social Interaction: 5-Interacts appropriately 90% of the time - Needs monitoring or encouragement for participation or interaction. Problem Solving Problem Solving: 5-Solves basic 90% of the time/requires cueing < 10% of the time Memory Memory: 5-Recognizes or recalls 90% of the time/requires cueing < 10% of the time FIM - Eating Eating Activity: 5: Set-up assist for open containers;5: Set-up assist for cut food;5: Supervision/cues General      Pain Pain Assessment Pain Assessment: No/denies pain Pain Score: 0-No pain  Therapy/Group: Individual Therapy  Charlane Ferretti., CCC-SLP [phone]  Kermit Arnette 12/25/2011, 4:31 PM

## 2011-12-25 NOTE — Progress Notes (Signed)
Occupational Therapy Weekly Progress Note and Session Note  Patient Details  Name: Craig [name] MRN: [id number] Date of Birth: 07/07/1951  Today's Date: 12/25/2011 Time: 1000-1100 Time Calculation (min): 60 min  Patient has met 0 of 4 short term goals.  Pt is showing steady progress towards goals.  He has shown progress of min assist with UB and LB dressing at one point, but is not consistent with 2/3 sessions.  Pt is progressing towards min assist squat pivot transfers to Rt, min-mod assist transfers to Lt.  Patient continues to demonstrate the following deficits: increased assist with dressing, min/steady assist in standing for balance, Lt alien arm, decreased sensation/proprioception in LUE and therefore will continue to benefit from skilled OT intervention to enhance overall performance with BADL, iADL and Reduce care partner burden.  Patient progressing toward long term goals..  Continue plan of care.  OT Short Term Goals Week 2:  OT Short Term Goal 1 (Week 2): Pt will complete UB dressing with min assist and min verbal cues for setup and sequencing OT Short Term Goal 1 - Progress (Week 2): Progressing toward goal OT Short Term Goal 2 (Week 2): Pt will complete squat pivot transfer with min assist and min verbal cues 2/3 sessions OT Short Term Goal 2 - Progress (Week 2): Progressing toward goal OT Short Term Goal 3 (Week 2): Pt will complete LB dressing with min assist with AE prn (elastic shoelaces) OT Short Term Goal 3 - Progress (Week 2): Progressing toward goal OT Short Term Goal 4 (Week 2): Pt will complete 2 of 3 toileting steps 2/3 sessions OT Short Term Goal 4 - Progress (Week 2): Progressing toward goal Week 3:  OT Short Term Goal 1 (Week 3): Pt will complete UB dressing with min assist and min verbal cues for setup and sequencing OT Short Term Goal 2 (Week 3): Pt will complete squat pivot transfer with min assist with min verbal cues 2/3 sessions OT Short Term Goal 3 (Week 3):  Pt will complete LB dressing with min assist OT Short Term Goal 4 (Week 3): Pt will complete 2 of 3 toileting steps 2/3 sessions  Skilled Therapeutic Interventions/Progress Updates:    Pt seen for ADL retraining at walk-in shower and sink level with focus on bathing at sit to stand level in shower from tub bench.  Pt demonstrated appropriate safety awareness with min cues for setup in bathroom for stand pivot transfer using assist of grab bars.  Pt completed bathing with min/steady assist in standing with min cues for standing balance.  Pt with increased independence with UB dressing this session with setup and decreased cues, requiring increased time for problem solving.    Therapy Documentation Precautions:  Precautions Precautions: Fall Precaution Comments: Lt inattention, Lt alien arm, Lt field cut Restrictions Weight Bearing Restrictions: No Pain:   Pt with no c/o pain this session. ADL: ADL Grooming: Setup;Minimal cueing;Minimal assistance Where Assessed-Grooming: Sitting at sink Upper Body Bathing: Setup;Minimal cueing Where Assessed-Upper Body Bathing: Sitting at sink Lower Body Bathing: Setup;Minimal cueing;Minimal assistance Where Assessed-Lower Body Bathing: Standing at sink;Sitting at sink Upper Body Dressing: Maximal assistance;Moderate cueing Where Assessed-Upper Body Dressing: Sitting at sink Lower Body Dressing: Moderate assistance Where Assessed-Lower Body Dressing: Sitting at sink;Standing at sink Toilet Transfer: Moderate assistance Toilet Transfer Method: Squat pivot Tub/Shower Transfer: Not assessed Film/video editor: Not assessed ADL Comments: Pt benefits from use of mirror with bathing and dressing for visual cues to accomodate for visual field cut and decreased  sensation and proprioception in LUE.  See FIM for current functional status  Therapy/Group: Individual Therapy  [name] 12/25/2011, 12:31 PM

## 2011-12-25 NOTE — Progress Notes (Signed)
Physical Therapy Session Note  Patient Details  Name: Craig [name] MRN: [id number] Date of Birth: 07/02/1951  Today's Date: 12/25/2011 Time: 1191-4782 Time Calculation (min): 57 min  Short Term Goals: Week 1:  PT Short Term Goal 1 (Week 1): Patient will perform bed mobility to L and R on flat bed with consistent mod A PT Short Term Goal 1 - Progress (Week 1): Progressing toward goal PT Short Term Goal 2 (Week 1): Patient will perform bed <> w/c transfers to L and R with consistent mod A and mod verbal cues for attention and placement of LLE/UE PT Short Term Goal 2 - Progress (Week 1): Progressing toward goal PT Short Term Goal 3 (Week 1): Patient will perform w/c mobility on unit x 150 with mod A with use of R hemi technique or bilat LE propulsion with increased attention to L side/environment PT Short Term Goal 3 - Progress (Week 1): Met PT Short Term Goal 4 (Week 1): Patient will perform standing balance, postural control and pre-gait training bilat UE supported max A with focus on improved orientation to midline, weight shift and controlled advancement of LLE PT Short Term Goal 4 - Progress (Week 1): Met Week 2:  PT Short Term Goal 1 (Week 2): Patient will perform bed mobility to L and R on flat bed with consistent mod A  PT Short Term Goal 1 - Progress (Week 2): Met PT Short Term Goal 2 (Week 2): Patient will perform bed <> w/c transfers to L and R with consistent mod A and mod verbal cues for attention and placement of LLE/UE  PT Short Term Goal 2 - Progress (Week 2): Met PT Short Term Goal 3 (Week 2): Patient will perform w/c mobility in mildly distracting environment with improved attention to L side and obstacle negotiation with supervision and verbal cues for safety PT Short Term Goal 3 - Progress (Week 2): Progressing toward goal PT Short Term Goal 4 (Week 2): Patient will perform gait training in controlled environment x 50' with mod A with improved sustained activation and  coordination of LLE PT Short Term Goal 4 - Progress (Week 2): Progressing toward goal Week 3:  PT Short Term Goal 1 (Week 3): Patient will perform w/c mobility in mildly distracting environment with improved attention to L side and obstacle negotiation with supervision and verbal cues for safety PT Short Term Goal 2 (Week 3): Patient will perform gait training in controlled environment x 100' with mod A with improved sustained activation and coordination of LLE  PT Short Term Goal 3 (Week 3): Patient will perform bed mobility on flat bed/mat with supervision and bed <> w/c transfers to L and R consistent min A  Therapy Documentation Precautions:  Precautions Precautions: Fall Precaution Comments: Lt inattention, Lt alien arm, Lt field cut Restrictions Weight Bearing Restrictions: No Pain: Pain Assessment Pain Assessment: No/denies pain Pain Score: 0-No pain Locomotion : Ambulation Ambulation/Gait Assistance: 1: +2 Total assist Ambulation Distance (Feet): 5 Feet Assistive device: Rolling walker Ambulation/Gait Assistance Details: Tactile cues for initiation;Tactile cues for weight shifting;Tactile cues for placement;Visual cues/gestures for sequencing;Verbal cues for sequencing;Verbal cues for technique;Verbal cues for gait pattern;Verbal cues for safe use of DME/AE Ambulation/Gait Assistance Details: Gait training with RW during forwards, retro and L lateral stepping with +2A for safety with focus on sequence with RW, lateral weight shifting and controlled advancement and placement of L foot in various directions following coordination training. Corporate treasurer: Yes Wheelchair Assistance: 2: Max Armed forces logistics/support/administrative officer  Assistance Details: Tactile cues for initiation;Tactile cues for sequencing;Tactile cues for weight beaing;Visual cues/gestures for precautions/safety;Visual cues/gestures for sequencing;Verbal cues for sequencing;Verbal cues for technique Wheelchair  Propulsion: Both lower extermities Wheelchair Parts Management: Needs assistance Distance: 150 with bilat LE alternating propulsion with assistance for sequencing and coordination of L and R LE and for safety and sequence with turning.  Other Treatments: Treatments Therapeutic Activity: Performed squat scoot transfer w/c > mat to R with supervision and verbal cues for sequence for w/c set up. Neuromuscular Facilitation: Left;Lower Extremity;Activity to increase coordination;Activity to increase motor control;Activity to increase grading;Activity to increase lateral weight shifting;Activity to increase anterior-posterior weight shifting during LLE coordinated toe tapping to rings of different colors for LLE stepping in various directions in front, anterior-lateral, lateral and posterior-lateral stepping patterns in sitting and then in standing with +2A for balance and safety.  See FIM for current functional status  Therapy/Group: Individual Therapy  Edman Circle [facility] 12/25/2011, 5:17 PM

## 2011-12-25 NOTE — Progress Notes (Signed)
Patient tolerating therapies today and no unsafe issues noted. Patient had large BM this afternoon without intervention. No complaints of pain. Appropriate for situation.  Continue with plan of care.

## 2011-12-26 NOTE — Progress Notes (Signed)
Speech Language Pathology Daily Session Note  Patient Details  Name: Craig [name] MRN: [id number] Date of Birth: August 13, 1951  Today's Date: 12/26/2011 Time: 1130-1200 Time Calculation (min): 30 min  Short Term Goals: Week 3: SLP Short Term Goal 1 (Week 3): Patient will attend to left of environment with modified independence SLP Short Term Goal 2 (Week 3): Patient will attend to left duirng reading and writing tasks with supervision sematnic cues SLP Short Term Goal 3 (Week 3): Patient will request help with minimal assist semantic cues  SLP Short Term Goal 4 (Week 3): Patient will utilize compensatory strategies for safe placement of left upper extremity to avoid encountering objects with minimal assist semantic cues   Skilled Therapeutic Interventions: Session focused on skilled treatment with problem solving and requesting help as needed during self feeding task; SLP facilitated session with moderate assist semantic cues to request help as needed and supervision semantic cues to attend to left of environment.    Daily Session Precautions/Restrictions    FIM:  Comprehension Comprehension Mode: Auditory Comprehension: 5-Understands complex 90% of the time/Cues < 10% of the time Expression Expression Mode: Verbal Expression: 6-Expresses complex ideas: With extra time/assistive device Social Interaction Social Interaction: 5-Interacts appropriately 90% of the time - Needs monitoring or encouragement for participation or interaction. Problem Solving Problem Solving: 5-Solves basic 90% of the time/requires cueing < 10% of the time Memory Memory: 6-More than reasonable amt of time FIM - Eating Eating Activity: 5: Set-up assist for open containers;5: Set-up assist for cut food General    Pain Pain Assessment Pain Assessment: No/denies pain Pain Score: 0-No pain  Therapy/Group: Group Therapy  Craig Ferretti., Craig [name] [phone]  Lillar Bianca 12/26/2011, 2:53 PM

## 2011-12-26 NOTE — Care Management Note (Signed)
Met with pt to report on team conference. Pt very motivated and in agreement with goals and d/c date of 01/04/12.

## 2011-12-26 NOTE — Progress Notes (Signed)
Physical Therapy Session Note  Patient Details  Name: Craig [name] MRN: [id number] Date of Birth: Jul 18, 1951  Today's Date: 12/26/2011 Time: 1400-1500 Time Calculation (min): 60 min  Short Term Goals: Week 1:  PT Short Term Goal 1 (Week 1): Patient will perform bed mobility to L and R on flat bed with consistent mod A PT Short Term Goal 1 - Progress (Week 1): Progressing toward goal PT Short Term Goal 2 (Week 1): Patient will perform bed <> w/c transfers to L and R with consistent mod A and mod verbal cues for attention and placement of LLE/UE PT Short Term Goal 2 - Progress (Week 1): Progressing toward goal PT Short Term Goal 3 (Week 1): Patient will perform w/c mobility on unit x 150 with mod A with use of R hemi technique or bilat LE propulsion with increased attention to L side/environment PT Short Term Goal 3 - Progress (Week 1): Met PT Short Term Goal 4 (Week 1): Patient will perform standing balance, postural control and pre-gait training bilat UE supported max A with focus on improved orientation to midline, weight shift and controlled advancement of LLE PT Short Term Goal 4 - Progress (Week 1): Met Week 2:  PT Short Term Goal 1 (Week 2): Patient will perform bed mobility to L and R on flat bed with consistent mod A  PT Short Term Goal 1 - Progress (Week 2): Met PT Short Term Goal 2 (Week 2): Patient will perform bed <> w/c transfers to L and R with consistent mod A and mod verbal cues for attention and placement of LLE/UE  PT Short Term Goal 2 - Progress (Week 2): Met PT Short Term Goal 3 (Week 2): Patient will perform w/c mobility in mildly distracting environment with improved attention to L side and obstacle negotiation with supervision and verbal cues for safety PT Short Term Goal 3 - Progress (Week 2): Progressing toward goal PT Short Term Goal 4 (Week 2): Patient will perform gait training in controlled environment x 50' with mod A with improved sustained activation and  coordination of LLE PT Short Term Goal 4 - Progress (Week 2): Progressing toward goal Week 3:  PT Short Term Goal 1 (Week 3): Patient will perform w/c mobility in mildly distracting environment with improved attention to L side and obstacle negotiation with supervision and verbal cues for safety PT Short Term Goal 2 (Week 3): Patient will perform gait training in controlled environment x 100' with mod A with improved sustained activation and coordination of LLE  PT Short Term Goal 3 (Week 3): Patient will perform bed mobility on flat bed/mat with supervision and bed <> w/c transfers to L and R consistent min A  Therapy Documentation Precautions:  Precautions Precautions: Fall Precaution Comments: Lt inattention, Lt alien arm, Lt field cut Restrictions Weight Bearing Restrictions: No Pain: Pain Assessment Pain Assessment: No/denies pain Pain Score: 0-No pain Locomotion : Ambulation Ambulation/Gait Assistance: 3: Mod assist with RW x 50' and mod A for management of LUE and RW safety and intermittent verbal cues for upright posture, L foot placement and full R step length; patient with decreased scissoring today.  Stair training: negotiated up and down 5 steps with 2 rails with mod A for safety and sequence while having patient purposely leading with LLE for training for placement and forced use of extensors; L lateral step ups 6" step for ABD strengthening and training x 10 reps with mod A   Other Treatments: Treatments Neuromuscular Facilitation: Left;Lower Extremity;Activity  to increase coordination;Activity to increase motor control;Activity to increase grading for pre-gait training in sitting and standing with bilat UE support during LLE toe taps in various directions with visual target on 2" step and various patterns with min-mod A for stability/balance.    See FIM for current functional status  Therapy/Group: Individual Therapy  Edman Circle [facility] 12/26/2011, 4:37 PM

## 2011-12-26 NOTE — Progress Notes (Signed)
Occupational Therapy Session Note  Patient Details  Name: Craig [name] MRN: [id number] Date of Birth: October 13, 1950  Today's Date: 12/26/2011 Time: 7829-5621 Time Calculation (min): 45 min  Short Term Goals: Week 3:  OT Short Term Goal 1 (Week 3): Pt will complete UB dressing with min assist and min verbal cues for setup and sequencing OT Short Term Goal 2 (Week 3): Pt will complete squat pivot transfer with min assist with min verbal cues 2/3 sessions OT Short Term Goal 3 (Week 3): Pt will complete LB dressing with min assist OT Short Term Goal 4 (Week 3): Pt will complete 2 of 3 toileting steps 2/3 sessions  Skilled Therapeutic Interventions/Progress Updates:    ADL retraining including bathing and dressing w/c level at sink.  Pt declined shower this morning.  Pt required min verbal cues for sequencing and problems solving.  Pt required min verbal cues to scan to left throughout session.  Pt attempted to pull up pants standing with steady assist and required min verbal cues to pull up pants on left.  Focus on sequencing, problem solving, LUE use, standing balance, and safety awareness.  Therapy Documentation Precautions:  Precautions Precautions: Fall Precaution Comments: Lt inattention, Lt alien arm, Lt field cut Restrictions Weight Bearing Restrictions: No   Pain: Pain Assessment Pain Assessment: No/denies pain  See FIM for current functional status  Therapy/Group: Individual Therapy  Rich Brave 12/26/2011, 1:29 PM

## 2011-12-26 NOTE — Patient Care Conference (Signed)
Inpatient RehabilitationTeam Conference Note Date: 12/26/2011   Time: 11:20 AM    Patient Name: Craig [name]      Medical Record Number: [id number]  Date of Birth: 28-May-1951 Sex: Male         Room/Bed: 4033/4033-01 Payor Info: Payor: BLUE CROSS BLUE SHIELD  Plan: [facility] PPO  Product Type: *No Product type*     Admitting Diagnosis: B CVA  Admit Date/Time:  12/10/2011  6:10 PM Admission Comments: No comment available   Primary Diagnosis:  Stroke Principal Problem: Stroke  Patient Active Problem List  Diagnoses Date Noted  . 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 Stroke 12/11/2011  . CVA (cerebral infarction) 12/06/2011  . Hypertension, accelerated 05/15/2011    Expected Discharge Date: Expected Discharge Date: 01/04/12  Team Members Present: Physician: Dr. Claudette Laws Case Manager Present: Lutricia Horsfall, RN Social Worker Present: Dossie Der, LCSW Nurse Present: Rosalio Macadamia, RN PT Present: Edman Circle, Lillie Columbia, PT OT Present: Bretta Bang, Verlene Mayer, OT SLP Present: Fae Pippin, SLP Perrin Maltese, OT Tora Duck, RN, PPS Coordinator     Current Status/Progress Goal Weekly Team Focus  Medical   Absent sensation, bladder inc  condom cath instruction at noc for home use  teach wife   Bowel/Bladder   Incontinent of bowel and bladder occasionally.  Wears condom cath at night.  continent bowel/bladder  q2h timed toileting   Swallow/Nutrition/ Hydration   regular, thin with intermittnet staff supervision   supervision   increase self monitoring and probelm solving   ADL's   min-mod A squat pivot transfer, min A/steady A bathing, mod A LB/UB dsg  supervision overall  transfers, Lt body/environment attention for dressing, sequencing, problem solving   Mobility   supervision-min A bed mobility and basic transfers, max-total standing balance and gait with RW, stairs, w/c mobility  supervision-min A overall   gait with  RW, coordination, balance, w/c mobility   Communication             Safety/Cognition/ Behavioral Observations  min-mod assist   minimal assist  increase problem solving and self monitoring    Pain   no pain issues         Skin   healed scab left elbow  no skin breakdown         *See Interdisciplinary Assessment and Plan and progress notes for long and short-term goals  Barriers to Discharge: severe ataxia    Possible Resolutions to Barriers:  more PT/OT    Discharge Planning/Teaching Needs:  Home with wife who will be on a FMLA, been in some to observe in therapies      Team Discussion:   Pt with sensory deficit/severe ataxia of LUE.  Participating and progressing steadily. Started on walker  yesterday. Wife to come for education.  Revisions to Treatment Plan: none    Continued Need for Acute Rehabilitation Level of Care: The patient requires daily medical management by a physician with specialized training in physical medicine and rehabilitation for the following conditions: Daily direction of a multidisciplinary physical rehabilitation program to ensure safe treatment while eliciting the highest outcome that is of practical value to the patient.: Yes Daily medical management of patient stability for increased activity during participation in an intensive rehabilitation regime.: Yes Daily analysis of laboratory values and/or radiology reports with any subsequent need for medication adjustment of medical intervention for : Neurological problems  Meryl Dare 12/26/2011, 4:46 PM

## 2011-12-26 NOTE — Progress Notes (Signed)
Occupational Therapy Note  Patient Details  Name: Craig [name] MRN: [id number] Date of Birth: 1951-03-24 Today's Date: 12/26/2011  Time: 1200-1220 Pt denies pain Group Therapy  Pt participated in self feeding group with focus on sequencing, LUE use/positioning, and group interaction.  Pt requires min verbal cues to scan to left to locate food items.  Pt initiating use of LUE as gross assist.   Rich Brave 12/26/2011, 1:46 PM

## 2011-12-26 NOTE — Progress Notes (Signed)
Speech Language Pathology Weekly Progress Note  Patient Details  Name: Craig [name] MRN: [id number] Date of Birth: 11-11-1950  Today's Date: 12/26/2011  Short Term Goals: Week 2: SLP Short Term Goal 1 (Week 2): Patient will attend to left of environment with supervision semantic cues SLP Short Term Goal 1 - Progress (Week 2): Met SLP Short Term Goal 2 (Week 2): Patient will attend to left with reading and writing tasks with minimal assist semantic cues SLP Short Term Goal 2 - Progress (Week 2): Met SLP Short Term Goal 3 (Week 2): Patient will request help as needed with moderate assist semantic cues SLP Short Term Goal 3 - Progress (Week 2): Met SLP Short Term Goal 4 (Week 2): Patient will utilize compensatory strategies for safe placement of left upper extremity to avoid encountering objects with max assist semantic cues SLP Short Term Goal 4 - Progress (Week 2): Met Week 3: SLP Short Term Goal 1 (Week 3): Patient will attend to left of environment with modified independence SLP Short Term Goal 2 (Week 3): Patient will attend to left duirng reading and writing tasks with supervision sematnic cues SLP Short Term Goal 3 (Week 3): Patient will request help with minimal assist semantic cues  SLP Short Term Goal 4 (Week 3): Patient will utilize compensatory strategies for safe placement of left upper extremity to avoid encountering objects with minimal assist semantic cues   Therapeutic Progress: Patient met 4 out of 4 short term goals this reporting period with gains in left attention to environment, reading and writing tasks as well as attending to left upper extremity and self monitoring throughout session.  Patient's biggest hindrance is his inability to recognize in function when he needs help and then requesting it.  As a result, this patient would continue to benefit from skilled SLP services to address his overall cognition with emphasis on awareness and problem solving.   Cognition:  Overall Cognitive Status: Impaired Arousal/Alertness: Awake/alert Orientation Level: Oriented X4 Attention: Alternating Focused Attention: Appears intact Sustained Attention: Appears intact Selective Attention: Appears intact Alternating Attention: Impaired Alternating Attention Impairment: Verbal basic;Functional basic Memory: Impaired Memory Impairment: Decreased recall of new information Awareness: Impaired Awareness Impairment: Emergent impairment Problem Solving: Impaired Problem Solving Impairment: Functional basic Executive Function: Initiating;Self Monitoring;Self Correcting Initiating: Impaired Self Monitoring: Impaired Self Correcting: Impaired Safety/Judgment: Impaired Oral/Motor: Oral Motor/Sensory Function Overall Oral Motor/Sensory Function: Appears within functional limits for tasks assessed Motor Speech Overall Motor Speech: Appears within functional limits for tasks assessed Motor Planning: Witnin functional limits Comprehension: Auditory Comprehension Overall Auditory Comprehension: Appears within functional limits for tasks assessed Reading Comprehension Reading Status: Impaired Effective Techniques: Other (comment) (cues and strategies for scanning left) Expression: Expression Primary Mode of Expression: Verbal Verbal Expression Overall Verbal Expression: Appears within functional limits for tasks assessed Pragmatics: No impairment Impairments: Topic maintenance;Turn Taking Interfering Components: Attention Effective Techniques: Semantic cues Other Verbal Expression Comments: word finding difficulty/organizatioal difficulty in conversation supervision-mod I Written Expression Dominant Hand: Right Written Expression: Exceptions to [facility] Self Formulation Ability: Word (left inattention impactes function)  Fae Pippin, M.A., CCC-SLP [phone]  Craig [name] 12/26/2011, 10:56 AM

## 2011-12-26 NOTE — Progress Notes (Signed)
Occupational Therapy Session Note  Patient Details  Name: Craig [name] MRN: [id number] Date of Birth: 06-26-1951  Today's Date: 12/26/2011 Time: 1400-1500 Time Calculation (min): 60 min    Skilled Therapeutic Interventions/Progress Updates: Patient seen for neuromuscular reeducation this afternoon.  Worked with patient to grade speed and power with left upper extremity to allow him to bring his hand to the objects he hoped to interact with, and also to begin to control features of grasp and release.  Patient heavily reliant on vision to compensate for lack of sensation.  Patient also using modeling of right hand to demonstrate movement for left hand.  Patient with lots of accessory movements, and excessive muscle activity with most tasks initially.       Therapy Documentation Precautions:  Precautions Precautions: Fall Precaution Comments: Lt inattention, Lt alien arm, Lt field cut Restrictions Weight Bearing Restrictions: No   Pain: Pain Assessment Pain Assessment: No/denies pain Pain Score: 0-No pain    See FIM for current functional status  Therapy/Group: Individual Therapy  Collier Salina 12/26/2011, 4:40 PM

## 2011-12-26 NOTE — Progress Notes (Signed)
Patient ID: Craig [name], male   DOB: 04-13-1951, 61 y.o.   MRN: [id number]  Subjective/Complaints: No complaints.  Good BM yesterday  Review of Systems  HENT: Positive for hearing loss.     Objective: Vital Signs: Blood pressure 125/72, pulse 61, temperature 98.6 F (37 C), temperature source Oral, resp. rate 18, height 5\' 10"  (1.778 m), weight 95.4 kg (210 lb 5.1 oz), SpO2 100.00%. No results found. Results for orders placed during the hospital encounter of 12/10/11 (from the past 72 hour(s))  GLUCOSE, CAPILLARY     Status: Abnormal   Collection Time   12/23/11 11:52 AM      Component Value Range Comment   Glucose-Capillary 126 (*) 70 - 99 (mg/dL)    Comment 1 Notify RN       Physical Exam  Nursing note and vitals reviewed.  Constitutional: He is oriented to person, place, and time. He appears well-developed.  HENT: Oral mucosa pink and moist.  Head: Normocephalic and atraumatic.  Eyes: Conjunctivae are normal. Pupils are equal, round, and reactive to light.  Neck: Normal range of motion. Neck supple. No thyromegaly present.  Cardiovascular: Normal rate. Regular rhythm without murmurs rubs or gallops.  Pulmonary/Chest: Effort normal and breath sounds normal. He has no wheezes, rales, rhonchi.  Abdominal: He exhibits no distension. There is no tenderness. Bowel sounds are positive  Musculoskeletal: He exhibits no edema.  Neurological: He is alert and oriented to person, place, and time.  Left visual field cut.Patient with mild apraxia and decreased fine motor skills. Left arm and leg are quite ataxic.  He is left hemisensory loss severe  He has diminished attention to the left side as well today. Cognitively he is quite appropriate and pleasant. Insight and awareness are fair.  Skin: Skin is warm and dry.   Assessment/Plan: 1. 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 Physiatrist is providing close team supervision and 24 hour management of active medical problems listed below. Physiatrist and rehab team continue to assess barriers to discharge/monitor patient progress toward functional and medical goals. FIM: FIM - Bathing Bathing Steps Patient Completed: Chest;Right Arm;Left Arm;Abdomen;Front perineal area;Buttocks;Right upper leg;Left upper leg;Right lower leg (including foot);Left lower leg (including foot) Bathing: 4: Steadying assist  FIM - Upper Body Dressing/Undressing Upper body dressing/undressing steps patient completed: Thread/unthread left sleeve of pullover shirt/dress;Put head through opening of pull over shirt/dress;Pull shirt over trunk Upper body dressing/undressing: 4: Min-Patient completed 75 plus % of tasks FIM - Lower Body Dressing/Undressing Lower body dressing/undressing steps patient completed: Thread/unthread right underwear leg;Thread/unthread left underwear leg;Thread/unthread right pants leg;Thread/unthread left pants leg;Don/Doff right shoe;Don/Doff left shoe Lower body dressing/undressing: 3: Mod-Patient completed 50-74% of tasks  FIM - Toileting Toileting steps completed by patient: Adjust clothing prior to toileting Toileting Assistive Devices: Grab bar or rail for support Toileting: 2: Max-Patient completed 1 of 3 steps  FIM - Archivist Transfers: 3-To toilet/BSC: Mod A (lift or lower assist);3-From toilet/BSC: Mod A (lift or lower assist)  FIM - Press photographer Assistive Devices: Arm rests Bed/Chair Transfer: 3: Bed > Chair or W/C: Mod A (lift or lower assist);5: Chair or W/C > Bed: Supervision (verbal cues/safety issues)  FIM - Locomotion: Wheelchair Distance: 150 with bilat LE alternating propulsion with assistance for sequencing and coordination of L and R LE and for safety and sequence with turning. Locomotion: Wheelchair: 2: Travels 150  ft or more:  maneuvers on rugs and over door sills with maximal assistance (Pt: 25 - 49%) FIM - Locomotion: Ambulation Locomotion: Ambulation Assistive Devices: Walker - Rolling Ambulation/Gait Assistance: 1: +2 Total assist Locomotion: Ambulation: 1: Two helpers  Comprehension Comprehension Mode: Auditory Comprehension: 5-Follows basic conversation/direction: With no assist  Expression Expression Mode: Verbal Expression: 5-Expresses basic needs/ideas: With extra time/assistive device  Social Interaction Social Interaction: 5-Interacts appropriately 90% of the time - Needs monitoring or encouragement for participation or interaction.  Problem Solving Problem Solving: 5-Solves basic 90% of the time/requires cueing < 10% of the time  Memory Memory Mode: Asleep Memory: 5-Recognizes or recalls 90% of the time/requires cueing < 10% of the time   Medical Problem List and Plan:  1. 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  2.. DVT Prophylaxis/Anticoagulation: SCDs and ambulation.  3. Hypertension. Norvasc 10 mg daily, hydrochlorothiazide 25 mg daily, Avapro 300 mg daily, bystolic 10 mg daily. BP well controlled. Avoid hypotension  4. Elevated hemoglobin A1c: Sugars at this point are essentially within normal limits. 5. Mood: pt very upbeat.   LOS (Days) 16 A FACE TO FACE EVALUATION WAS PERFORMED  Alfred Harrel E 12/26/2011, 7:58 AM

## 2011-12-27 NOTE — Progress Notes (Signed)
Speech Language Pathology Daily Session Note  Patient Details  Name: Jaran Sainz MRN: [id number] Date of Birth: 1951-07-03  Today's Date: 12/27/2011 Time: 1530-1600 Time Calculation (min): 30 min  Short Term Goals: Week 3: SLP Short Term Goal 1 (Week 3): Patient will attend to left of environment with modified independence SLP Short Term Goal 2 (Week 3): Patient will attend to left duirng reading and writing tasks with supervision sematnic cues SLP Short Term Goal 3 (Week 3): Patient will request help with minimal assist semantic cues  SLP Short Term Goal 4 (Week 3): Patient will utilize compensatory strategies for safe placement of left upper extremity to avoid encountering objects with minimal assist semantic cues   Skilled Therapeutic Interventions: Session focused on left attention during reading and writing task with use of menu and writing order; SLP facilitated session with supervision visual cues for scanning with patient utilizing paper as a guide to assist with scanning and moderate assist semantic cues for left attention with writing.    Daily Session Precautions/Restrictions    FIM:  Comprehension Comprehension Mode: Auditory Comprehension: 5-Understands complex 90% of the time/Cues < 10% of the time Expression Expression Mode: Verbal Expression: 5-Expresses complex 90% of the time/cues < 10% of the time Social Interaction Social Interaction: 5-Interacts appropriately 90% of the time - Needs monitoring or encouragement for participation or interaction. Problem Solving Problem Solving: 4-Solves basic 75 - 89% of the time/requires cueing 10 - 24% of the time Memory Memory: 5-Recognizes or recalls 90% of the time/requires cueing < 10% of the time General    Pain Pain Assessment Pain Assessment: No/denies pain Pain Score: 0-No pain  Therapy/Group: Individual Therapy  Charlane Ferretti., CCC-SLP [phone]  Vedant Shehadeh 12/27/2011, 4:14 PM

## 2011-12-27 NOTE — Progress Notes (Signed)
Speech Language Pathology Daily Session Note  Patient Details  Name: Craig [name] MRN: [id number] Date of Birth: 1951/07/18  Today's Date: 12/27/2011 Time: 1200-1215 Time Calculation (min): 15 min  Short Term Goals: Week 3: SLP Short Term Goal 1 (Week 3): Patient will attend to left of environment with modified independence SLP Short Term Goal 2 (Week 3): Patient will attend to left duirng reading and writing tasks with supervision sematnic cues SLP Short Term Goal 3 (Week 3): Patient will request help with minimal assist semantic cues  SLP Short Term Goal 4 (Week 3): Patient will utilize compensatory strategies for safe placement of left upper extremity to avoid encountering objects with minimal assist semantic cues   Skilled Therapeutic Interventions: Session focused on attention to left upper extremity and problem solving with self feeding; SLP facilitated session with minimal assist semantic cues to utilize compensatory strategies in function and recognize/request assistance.   Daily Session Precautions/Restrictions  Restrictions Weight Bearing Restrictions: No FIM:  FIM - Eating Eating Activity: 5: Set-up assist for open containers General    Pain Pain Assessment Pain Assessment: No/denies pain Pain Score: 0-No pain  Therapy/Group: Group Therapy  Craig Ferretti., CCC-SLP [phone]  Craig [name] 12/27/2011, 12:53 PM

## 2011-12-27 NOTE — Progress Notes (Signed)
Occupational Therapy Session Note  Patient Details  Name: Craig [name] MRN: [id number] Date of Birth: 11-05-1950  Today's Date: 12/27/2011 Time: 0730-0830 Time Calculation (min): 60 min  Short Term Goals: Week 3:  OT Short Term Goal 1 (Week 3): Pt will complete UB dressing with min assist and min verbal cues for setup and sequencing OT Short Term Goal 2 (Week 3): Pt will complete squat pivot transfer with min assist with min verbal cues 2/3 sessions OT Short Term Goal 3 (Week 3): Pt will complete LB dressing with min assist OT Short Term Goal 4 (Week 3): Pt will complete 2 of 3 toileting steps 2/3 sessions  Skilled Therapeutic Interventions/Progress Updates:    Pt seen for ADL retraining at sink per pt's request.  Focus on standing balance, sequencing, problem solving, LUE use, and safety awareness. Pt required min cues for sequencing and problem solving.  Focus on grading of grasp with grasping washcloth and deodorant and using vision to compensate for absent sensation.  Therapy Documentation Precautions:  Precautions Precautions: Fall Precaution Comments: Lt inattention, Lt alien arm, Lt field cut Restrictions Weight Bearing Restrictions: No General:   Vital Signs: Therapy Vitals Temp: 98 F (36.7 C) Temp src: Oral Pulse Rate: 62  Resp: 20  BP: 134/74 mmHg Patient Position, if appropriate: Lying Oxygen Therapy SpO2: 100 % O2 Device: None (Room air) Pain:   Pt with no c/o pain this session.  See FIM for current functional status  Therapy/Group: Individual Therapy  [name] 12/27/2011, 9:26 AM

## 2011-12-27 NOTE — Progress Notes (Signed)
Pt alert oriented x 4, nurse was assisting pt to bed when he his feet gave out pt lowered to the floor.  Denies any pain. Assessment was initiated no injuries noted. He was assist back to bed. PA Dan Finland was notified. Will continue to monitor.

## 2011-12-27 NOTE — Progress Notes (Addendum)
Occupational Therapy Note  Patient Details  Name: Suresh Audi MRN: [id number] Date of Birth: [birth year] Today's Date: 12/27/2011  1130-1200 - 30 Minutes Group Therapy ([name]) Co-Treatment with SLP No complaints of pain  Focused skilled occupational therapy on functional use of bilateral UEs/hands - especially left UE as a gross assist, visual attention to left side of plate, and small bites with food. Patient required min verbal cues for awareness of left UE/hand and min verbal cues for small bites of food. Patient appropriate during meal and engaged in appropriate conversations.   Jakolby Sedivy 12/27/2011, 1:10 PM

## 2011-12-27 NOTE — Progress Notes (Signed)
Wife was notify that pt was assist to the floor,  Vital sign 164/92, p 67, r 18 O2 98%. Denies pain.

## 2011-12-27 NOTE — Progress Notes (Signed)
Patient ID: Craig [name], male   DOB: [birth year], 61 y.o.   MRN: [id number]  Subjective/Complaints: No complaints.  Good BM yesterday  Review of Systems  HENT: Positive for hearing loss.     Objective: Vital Signs: Blood pressure 123/73, pulse 62, temperature 98 F (36.7 C), temperature source Oral, resp. rate 20, height 5\' 10"  (1.778 m), weight 88.2 kg (194 lb 7.1 oz), SpO2 100.00%. No results found. No results found for this or any previous visit (from the past 72 hour(s)).  Physical Exam  Nursing note and vitals reviewed.  Constitutional: He is oriented to person, place, and time. He appears well-developed.  HENT: Oral mucosa pink and moist.  Head: Normocephalic and atraumatic.  Eyes: Conjunctivae are normal. Pupils are equal, round, and reactive to light.  Neck: Normal range of motion. Neck supple. No thyromegaly present.  Cardiovascular: Normal rate. Regular rhythm without murmurs rubs or gallops.  Pulmonary/Chest: Effort normal and breath sounds normal. He has no wheezes, rales, rhonchi.  Abdominal: He exhibits no distension. There is no tenderness. Bowel sounds are positive  Musculoskeletal: He exhibits no edema.  Neurological: He is alert and oriented to person, place, and time.  Left visual field cut.Patient with mild apraxia and decreased fine motor skills. Left arm and leg are quite ataxic.  He is left hemisensory loss severe  He has diminished attention to the left side as well today. Cognitively he is quite appropriate and pleasant. Insight and awareness are fair.  Skin: Skin is warm and dry.   Assessment/Plan: 1. 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 Physiatrist is providing close team supervision and 24 hour management of active medical problems listed below. Physiatrist and rehab team continue to assess barriers to discharge/monitor  patient progress toward functional and medical goals. FIM: FIM - Bathing Bathing Steps Patient Completed: Chest;Right Arm;Left Arm;Abdomen;Front perineal area;Buttocks;Right upper leg;Left upper leg;Right lower leg (including foot);Left lower leg (including foot) Bathing: 4: Steadying assist  FIM - Upper Body Dressing/Undressing Upper body dressing/undressing steps patient completed: Thread/unthread right sleeve of pullover shirt/dresss;Pull shirt over trunk Upper body dressing/undressing: 3: Mod-Patient completed 50-74% of tasks FIM - Lower Body Dressing/Undressing Lower body dressing/undressing steps patient completed: Thread/unthread right underwear leg;Thread/unthread right pants leg;Thread/unthread left pants leg;Don/Doff left sock;Don/Doff right shoe;Don/Doff left shoe;Don/Doff right sock Lower body dressing/undressing: 3: Mod-Patient completed 50-74% of tasks  FIM - Toileting Toileting steps completed by patient: Adjust clothing prior to toileting Toileting Assistive Devices: Grab bar or rail for support Toileting: 2: Max-Patient completed 1 of 3 steps  FIM - Archivist Transfers: 3-To toilet/BSC: Mod A (lift or lower assist);3-From toilet/BSC: Mod A (lift or lower assist)  FIM - Press photographer Assistive Devices: Arm rests Bed/Chair Transfer: 5: Bed > Chair or W/C: Supervision (verbal cues/safety issues);5: Chair or W/C > Bed: Supervision (verbal cues/safety issues)  FIM - Locomotion: Wheelchair Distance: 150 with bilat LE alternating propulsion with assistance for sequencing and coordination of L and R LE and for safety and sequence with turning. Locomotion: Wheelchair: 1: Total Assistance/staff pushes wheelchair (Pt<25%) FIM - Locomotion: Ambulation Locomotion: Ambulation Assistive Devices: Walker - Rolling Ambulation/Gait Assistance: 3: Mod assist Locomotion: Ambulation: 2: Travels 50 - 149 ft with moderate assistance (Pt: 50 -  74%)  Comprehension Comprehension Mode: Auditory Comprehension: 5-Understands complex 90% of the time/Cues < 10% of the time  Expression Expression Mode: Verbal Expression: 5-Expresses  basic needs/ideas: With extra time/assistive device  Social Interaction Social Interaction: 5-Interacts appropriately 90% of the time - Needs monitoring or encouragement for participation or interaction.  Problem Solving Problem Solving: 5-Solves basic 90% of the time/requires cueing < 10% of the time  Memory Memory Mode: Asleep Memory: 6-More than reasonable amt of time   Medical Problem List and Plan:  1. 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  2.. DVT Prophylaxis/Anticoagulation: SCDs and ambulation.  3. Hypertension. Norvasc 10 mg daily, hydrochlorothiazide 25 mg daily, Avapro 300 mg daily, bystolic 10 mg daily. BP well controlled. Avoid hypotension  4. Elevated hemoglobin A1c: Sugars at this point are essentially within normal limits. 5. Mood: pt very upbeat.   LOS (Days) 17 A FACE TO FACE EVALUATION WAS PERFORMED  Craig [name] 12/27/2011, 8:11 AM

## 2011-12-27 NOTE — Progress Notes (Signed)
Physical Therapy Session Note  Patient Details  Name: Craig [name] MRN: [id number] Date of Birth: Mar 19, 1951  Today's Date: 12/27/2011 Time: 7425-9563 Time Calculation (min): 58 min  Short Term Goals: Week 1:  PT Short Term Goal 1 (Week 1): Patient will perform bed mobility to L and R on flat bed with consistent mod A PT Short Term Goal 1 - Progress (Week 1): Progressing toward goal PT Short Term Goal 2 (Week 1): Patient will perform bed <> w/c transfers to L and R with consistent mod A and mod verbal cues for attention and placement of LLE/UE PT Short Term Goal 2 - Progress (Week 1): Progressing toward goal PT Short Term Goal 3 (Week 1): Patient will perform w/c mobility on unit x 150 with mod A with use of R hemi technique or bilat LE propulsion with increased attention to L side/environment PT Short Term Goal 3 - Progress (Week 1): Met PT Short Term Goal 4 (Week 1): Patient will perform standing balance, postural control and pre-gait training bilat UE supported max A with focus on improved orientation to midline, weight shift and controlled advancement of LLE PT Short Term Goal 4 - Progress (Week 1): Met Week 2:  PT Short Term Goal 1 (Week 2): Patient will perform bed mobility to L and R on flat bed with consistent mod A  PT Short Term Goal 1 - Progress (Week 2): Met PT Short Term Goal 2 (Week 2): Patient will perform bed <> w/c transfers to L and R with consistent mod A and mod verbal cues for attention and placement of LLE/UE  PT Short Term Goal 2 - Progress (Week 2): Met PT Short Term Goal 3 (Week 2): Patient will perform w/c mobility in mildly distracting environment with improved attention to L side and obstacle negotiation with supervision and verbal cues for safety PT Short Term Goal 3 - Progress (Week 2): Progressing toward goal PT Short Term Goal 4 (Week 2): Patient will perform gait training in controlled environment x 50' with mod A with improved sustained activation and  coordination of LLE PT Short Term Goal 4 - Progress (Week 2): Progressing toward goal Week 3:  PT Short Term Goal 1 (Week 3): Patient will perform w/c mobility in mildly distracting environment with improved attention to L side and obstacle negotiation with supervision and verbal cues for safety PT Short Term Goal 2 (Week 3): Patient will perform gait training in controlled environment x 100' with mod A with improved sustained activation and coordination of LLE  PT Short Term Goal 3 (Week 3): Patient will perform bed mobility on flat bed/mat with supervision and bed <> w/c transfers to L and R consistent min A  Therapy Documentation Precautions:  Precautions Precautions: Fall Precaution Comments: Lt inattention, Lt alien arm, Lt field cut Restrictions Weight Bearing Restrictions: No General:  Spent first 20 minutes of session assisting patient with cognitive task of scanning L > R on board or piece of paper for wife's phone number and holding 2-3 numbers in memory to dial on phone with max-total verbal and visual cues; also spent time using clock to determine current time and amount of time left in therapy in order to tell his wife when she could come visit; patient required max-total verbal, questioning and visual cues to determine time.   Pain: Pain Assessment Pain Assessment: No/denies pain Pain Score: 0-No pain Locomotion : Ambulation Ambulation/Gait Assistance: 3: Mod assist Ambulation Distance (Feet): 150 Feet (x 2) Assistive device: Rolling walker  Ambulation/Gait Assistance Details: Tactile cues for sequencing;Tactile cues for weight shifting;Tactile cues for placement;Visual cues for safe use of DME/AE;Visual cues/gestures for precautions/safety;Visual cues/gestures for sequencing;Verbal cues for sequencing;Verbal cues for technique;Verbal cues for gait pattern;Verbal cues for precautions/safety;Verbal cues for safe use of DME/AE Ambulation/Gait Assistance Details: Gait training on  unit x 150' x 2 with RW and mod A for placement and to maintain L hand position on RW and with verbal and visual cues for LLE advancement and placement and for safety during turning and obstacle negotiation and problem solving navigation through double doors (looking L for open door).   Stairs / Management consultant Assistance: 2: Max Estate agent Assistance Details: Tactile cues for initiation;Tactile cues for sequencing;Tactile cues for weight shifting;Tactile cues for placement;Visual cues for safe use of DME/AE;Visual cues/gestures for precautions/safety;Visual cues/gestures for sequencing;Verbal cues for sequencing;Verbal cues for technique;Verbal cues for precautions/safety;Verbal cues for gait pattern Stairs Assistance Details (indicate cue type and reason): Up and down one platform step with RW and max A for lifting and safe placement of RW and for sequence ascending with RLE and descending with LLE Stair Management Technique: Step to pattern;Forwards;With walker Number of Stairs: 1  Height of Stairs: 4   Other Treatments: Treatments Neuromuscular Facilitation: Left;Upper Extremity;Activity to increase motor control sitting WB through LUE on elbow on mat during RUE reaching in various directions out of BOS to facilitate increased L trunk shortening <> elongation, trunk anterior lean and shoulder flexion/extension/rotation in closed chain position for motor control while holding on to AD  See FIM for current functional status  Therapy/Group: Individual Therapy  Edman Circle Falcon [facility] 12/27/2011, 4:49 PM

## 2011-12-28 NOTE — Progress Notes (Signed)
Speech Language Pathology Daily Session Note  Patient Details  Name: Craig [name] MRN: [id number] Date of Birth: April 27, 1951  Today's Date: 12/28/2011 Time: 0915-1000 Time Calculation (min): 45 min  Short Term Goals: Week 3: SLP Short Term Goal 1 (Week 3): Patient will attend to left of environment with modified independence SLP Short Term Goal 2 (Week 3): Patient will attend to left duirng reading and writing tasks with supervision sematnic cues SLP Short Term Goal 3 (Week 3): Patient will request help with minimal assist semantic cues  SLP Short Term Goal 4 (Week 3): Patient will utilize compensatory strategies for safe placement of left upper extremity to avoid encountering objects with minimal assist semantic cues   Skilled Therapeutic Interventions: Session focused on assist patient to complete hand hygiene task with moderate assist semantic cues to problem solve and sequence task due sensory and perceptual deficits.  Patient unable to recall procedures for game played 2 days ago and SLP facilitated task with max faded to moderate assist semantic cues to alternate attention between varying categories as well as minimal assist semantic cues to attend to left of environment during activity.   Daily Session Precautions/Restrictions    FIM:  Comprehension Comprehension Mode: Auditory Comprehension: 5-Understands complex 90% of the time/Cues < 10% of the time Expression Expression Mode: Verbal Expression: 5-Expresses complex 90% of the time/cues < 10% of the time Social Interaction Social Interaction: 6-Interacts appropriately with others with medication or extra time (anti-anxiety, antidepressant). Problem Solving Problem Solving: 3-Solves basic 50 - 74% of the time/requires cueing 25 - 49% of the time Memory Memory: 5-Recognizes or recalls 90% of the time/requires cueing < 10% of the time FIM - Eating Eating Activity: 5: Set-up assist for open containers General    Pain Pain  Assessment Pain Assessment: No/denies pain Pain Score: 0-No pain  Therapy/Group: Individual Therapy  Charlane Ferretti., CCC-SLP [phone]  Craig [name] 12/28/2011, 11:15 AM

## 2011-12-28 NOTE — Progress Notes (Signed)
Physical Therapy Session Note  Patient Details  Name: Craig [name] MRN: [id number] Date of Birth: [birth year]  Today's Date: 12/28/2011 Time: 1005-1104 Time Calculation (min): 59 min  Short Term Goals: Week 1:  PT Short Term Goal 1 (Week 1): Patient will perform bed mobility to L and R on flat bed with consistent mod A PT Short Term Goal 1 - Progress (Week 1): Progressing toward goal PT Short Term Goal 2 (Week 1): Patient will perform bed <> w/c transfers to L and R with consistent mod A and mod verbal cues for attention and placement of LLE/UE PT Short Term Goal 2 - Progress (Week 1): Progressing toward goal PT Short Term Goal 3 (Week 1): Patient will perform w/c mobility on unit x 150 with mod A with use of R hemi technique or bilat LE propulsion with increased attention to L side/environment PT Short Term Goal 3 - Progress (Week 1): Met PT Short Term Goal 4 (Week 1): Patient will perform standing balance, postural control and pre-gait training bilat UE supported max A with focus on improved orientation to midline, weight shift and controlled advancement of LLE PT Short Term Goal 4 - Progress (Week 1): Met Week 2:  PT Short Term Goal 1 (Week 2): Patient will perform bed mobility to L and R on flat bed with consistent mod A  PT Short Term Goal 1 - Progress (Week 2): Met PT Short Term Goal 2 (Week 2): Patient will perform bed <> w/c transfers to L and R with consistent mod A and mod verbal cues for attention and placement of LLE/UE  PT Short Term Goal 2 - Progress (Week 2): Met PT Short Term Goal 3 (Week 2): Patient will perform w/c mobility in mildly distracting environment with improved attention to L side and obstacle negotiation with supervision and verbal cues for safety PT Short Term Goal 3 - Progress (Week 2): Progressing toward goal PT Short Term Goal 4 (Week 2): Patient will perform gait training in controlled environment x 50' with mod A with improved sustained activation and  coordination of LLE PT Short Term Goal 4 - Progress (Week 2): Progressing toward goal Week 3:  PT Short Term Goal 1 (Week 3): Patient will perform w/c mobility in mildly distracting environment with improved attention to L side and obstacle negotiation with supervision and verbal cues for safety PT Short Term Goal 2 (Week 3): Patient will perform gait training in controlled environment x 100' with mod A with improved sustained activation and coordination of LLE  PT Short Term Goal 3 (Week 3): Patient will perform bed mobility on flat bed/mat with supervision and bed <> w/c transfers to L and R consistent min A  Therapy Documentation Precautions:  Precautions Precautions: Fall Precaution Comments: Lt inattention, Lt alien arm, Lt field cut Restrictions Weight Bearing Restrictions: No Pain: Pain Assessment Pain Assessment: No/denies pain Pain Score: 0-No pain Locomotion : Ambulation Ambulation/Gait Assistance: 3: Mod assist Ambulation Distance (Feet): 50 Feet Assistive device: Rolling walker;Other (Comment) (R wall rail) Ambulation/Gait Assistance Details: Tactile cues for initiation;Tactile cues for sequencing;Tactile cues for weight shifting;Tactile cues for posture;Tactile cues for placement;Visual cues/gestures for sequencing;Verbal cues for sequencing;Verbal cues for technique;Verbal cues for gait pattern  Gait training on unit x 50' with RW with ACE wrap to assist with to maintain L hand position on RW and x 25' forwards and backwards with RUE support on wall rail and LUE held against body with pants elastic waist band and with verbal, tactile and  visual cues for LLE advancement and placement (2 different tile colors on floor), upright trunk anterior/posterior and lateral weight shifts and activation of LLE extensors in stance for full R step length  Other Treatments: Treatments Neuromuscular Facilitation: Left;Lower Extremity;Activity to increase coordination;Activity to increase  timing and sequencing;Activity to increase sustained activation;Activity to increase grading;Activity to increase lateral weight shifting;Activity to increase anterior-posterior weight shifting  See FIM for current functional status  Therapy/Group: Individual Therapy  Edman Circle [facility] 12/28/2011, 12:52 PM

## 2011-12-28 NOTE — Progress Notes (Signed)
Pt resting in bed denies any pain or discomfort. Will continue to monitor.

## 2011-12-28 NOTE — Progress Notes (Signed)
Occupational Therapy Note  Patient Details  Name: Craig [name] MRN: [id number] Date of Birth: 1950/11/20 Today's Date: 12/28/2011  1130-1200 - 30 Minutes Group Therapy ([name]) Co-Treatment with SLP No complaints of pain  Patient participated in self feeding group with focus on functional use of bilateral UEs/hands - especially left UE as a gross assist, visual attention to left side of plate, and small bites with food. Patient required min verbal cues and physical assist for awareness and clean up of left UE/hand when left hand buried itself in the pile of mashed potatoes without patient's knowledge. Patient appropriate during meal and engaged in appropriate conversations.   [name] 12/28/2011, 1:03 PM

## 2011-12-28 NOTE — Progress Notes (Signed)
Patient ID: Craig [name], male   DOB: [birth year], 61 y.o.   MRN: [id number]  Subjective/Complaints: No complaints.  Good BM yesterday.  No pain  Review of Systems  HENT: Positive for hearing loss.     Objective: Vital Signs: Blood pressure 132/76, pulse 85, temperature 98.4 F (36.9 C), temperature source Oral, resp. rate 18, height 5\' 10"  (1.778 m), weight 88.2 kg (194 lb 7.1 oz), SpO2 100.00%. No results found. No results found for this or any previous visit (from the past 72 hour(s)).  Physical Exam  Nursing note and vitals reviewed.  Constitutional: He is oriented to person, place, and time. He appears well-developed.  HENT: Oral mucosa pink and moist.  Head: Normocephalic and atraumatic.  Eyes: Conjunctivae are normal. Pupils are equal, round, and reactive to light.  Neck: Normal range of motion. Neck supple. No thyromegaly present.  Cardiovascular: Normal rate. Regular rhythm without murmurs rubs or gallops.  Pulmonary/Chest: Effort normal and breath sounds normal. He has no wheezes, rales, rhonchi.  Abdominal: He exhibits no distension. There is no tenderness. Bowel sounds are positive  Musculoskeletal: He exhibits no edema.  Neurological: He is alert and oriented to person, place, and time.  Left visual field cut.Patient with mild apraxia and decreased fine motor skills. Left arm and leg are quite ataxic.  He is left hemisensory loss severe  He has diminished attention to the left side as well today. Cognitively he is quite appropriate and pleasant. Insight and awareness are fair. L homonymous hemianopsia.  Reduced finger to thumb opposition Skin: Skin is warm and dry.   Assessment/Plan: 1. 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 Physiatrist is providing close team supervision and 24 hour management of active medical problems listed  below. Physiatrist and rehab team continue to assess barriers to discharge/monitor patient progress toward functional and medical goals. FIM: FIM - Bathing Bathing Steps Patient Completed: Chest;Right Arm;Left Arm;Abdomen;Front perineal area;Buttocks;Right upper leg;Left upper leg;Right lower leg (including foot);Left lower leg (including foot) Bathing: 4: Steadying assist  FIM - Upper Body Dressing/Undressing Upper body dressing/undressing steps patient completed: Thread/unthread right sleeve of pullover shirt/dresss;Pull shirt over trunk Upper body dressing/undressing: 3: Mod-Patient completed 50-74% of tasks FIM - Lower Body Dressing/Undressing Lower body dressing/undressing steps patient completed: Thread/unthread right underwear leg;Thread/unthread left underwear leg;Pull underwear up/down;Thread/unthread right pants leg;Thread/unthread left pants leg;Pull pants up/down;Don/Doff right sock;Don/Doff right shoe;Don/Doff left shoe Lower body dressing/undressing: 4: Min-Patient completed 75 plus % of tasks  FIM - Toileting Toileting steps completed by patient: Adjust clothing prior to toileting Toileting Assistive Devices: Grab bar or rail for support Toileting: 2: Max-Patient completed 1 of 3 steps  FIM - Archivist Transfers: 3-To toilet/BSC: Mod A (lift or lower assist);3-From toilet/BSC: Mod A (lift or lower assist)  FIM - Bed/Chair Transfer Bed/Chair Transfer Assistive Devices: Therapist, occupational: 5: Bed > Chair or W/C: Supervision (verbal cues/safety issues);5: Chair or W/C > Bed: Supervision (verbal cues/safety issues)  FIM - Locomotion: Wheelchair Distance: 150 with bilat LE alternating propulsion with assistance for sequencing and coordination of L and R LE and for safety and sequence with turning. Locomotion: Wheelchair: 0: Activity did not occur FIM - Locomotion: Ambulation Locomotion: Ambulation Assistive Devices: Designer, industrial/product Ambulation/Gait  Assistance: 3: Mod assist Locomotion: Ambulation: 3: Travels 150 ft or more with moderate assistance (Pt: 50 - 74%)  Comprehension Comprehension Mode: Auditory Comprehension: 5-Understands complex  90% of the time/Cues < 10% of the time  Expression Expression Mode: Verbal Expression: 5-Expresses complex 90% of the time/cues < 10% of the time  Social Interaction Social Interaction: 6-Interacts appropriately with others with medication or extra time (anti-anxiety, antidepressant).  Problem Solving Problem Solving: 4-Solves basic 75 - 89% of the time/requires cueing 10 - 24% of the time  Memory Memory Mode: Asleep Memory: 5-Recognizes or recalls 90% of the time/requires cueing < 10% of the time   Medical Problem List and Plan:  1. 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  2.. DVT Prophylaxis/Anticoagulation: SCDs and ambulation.  3. Hypertension. Norvasc 10 mg daily, hydrochlorothiazide 25 mg daily, Avapro 300 mg daily, bystolic 10 mg daily. BP well controlled. Avoid hypotension  4. Elevated hemoglobin A1c: Sugars at this point are essentially within normal limits. 5. Mood: pt very upbeat.   LOS (Days) 18 A FACE TO FACE EVALUATION WAS PERFORMED  Evee Liska E 12/28/2011, 7:04 AM

## 2011-12-28 NOTE — Progress Notes (Signed)
Occupational Therapy Session Note  Patient Details  Name: Gregg Winchell MRN: [id number] Date of Birth: Feb 16, 1951  Today's Date: 12/28/2011 Time: 4098-1191 Time Calculation (min): 45 min  Short Term Goals: Week 3:  OT Short Term Goal 1 (Week 3): Pt will complete UB dressing with min assist and min verbal cues for setup and sequencing OT Short Term Goal 2 (Week 3): Pt will complete squat pivot transfer with min assist with min verbal cues 2/3 sessions OT Short Term Goal 3 (Week 3): Pt will complete LB dressing with min assist OT Short Term Goal 4 (Week 3): Pt will complete 2 of 3 toileting steps 2/3 sessions  Skilled Therapeutic Interventions/Progress Updates:    Pt seen for ADL retraining at sink level per pt's request.  Focus on stand pivot transfers, sit to stand, sequencing, problem solving, attention to Lt environment, and LUE use.  Pt required mod cues for problem solving with dressing as pt struggled with orientation of pants and shirt.  Pt min/steady assist in standing with periarea bathing.    Therapy Documentation Precautions:  Precautions Precautions: Fall Precaution Comments: Lt inattention, Lt alien arm, Lt field cut Restrictions Weight Bearing Restrictions: No Pain: Pain Assessment Pain Assessment: No/denies pain  See FIM for current functional status  Therapy/Group: Individual Therapy  [name] 12/28/2011, 9:05 AM

## 2011-12-28 NOTE — Progress Notes (Signed)
Speech Language Pathology Daily Session Note  Patient Details  Name: Craig [name] MRN: [id number] Date of Birth: [birth year]  Today's Date: 12/28/2011 Time: 1200-1220 Time Calculation (min): 20 min  Short Term Goals: Week 3: SLP Short Term Goal 1 (Week 3): Patient will attend to left of environment with modified independence SLP Short Term Goal 2 (Week 3): Patient will attend to left duirng reading and writing tasks with supervision sematnic cues SLP Short Term Goal 3 (Week 3): Patient will request help with minimal assist semantic cues  SLP Short Term Goal 4 (Week 3): Patient will utilize compensatory strategies for safe placement of left upper extremity to avoid encountering objects with minimal assist semantic cues   Skilled Therapeutic Interventions: Session focused on group treatment with SLP facilitating left attention to upper extremity with minimal assist semantic and tactile cues and to utilize compensatory strategy for planting upper extremity in a safe place when not in use.  Patient also required minimal assist semantic cues to attend to left of environment during session.    Daily Session Pain Pain Assessment Pain Assessment: No/denies pain Pain Score: 0-No pain  Therapy/Group: Group Therapy  Charlane Ferretti., CCC-SLP [phone]  Dameian Crisman 12/28/2011, 1:39 PM

## 2011-12-28 NOTE — Progress Notes (Signed)
Social Work Patient ID: Craig [name], male   DOB: [birth year], 61 y.o.   MRN: [id number] Met with pt and wife when here to discuss discharge plans and wife coming in next week For family education.  She will come in Wed and Thurs to go through therapies.  Both Pleased with the progress pt has made while here.  Agreeable to OP rehab therapies at Discharge.  Will make referral to get the first appt.  Continue to work on discharge plans.

## 2011-12-29 DIAGNOSIS — I69993 Ataxia following unspecified cerebrovascular disease: Secondary | ICD-10-CM

## 2011-12-29 DIAGNOSIS — Z5189 Encounter for other specified aftercare: Secondary | ICD-10-CM

## 2011-12-29 DIAGNOSIS — I6330 Cerebral infarction due to thrombosis of unspecified cerebral artery: Secondary | ICD-10-CM

## 2011-12-29 NOTE — Progress Notes (Signed)
Occupational Therapy Session Note  Patient Details  Name: Montana Fassnacht MRN: [id number] Date of Birth: Jan 07, 1951  Today's Date: 12/29/2011 Time: 1200-1215 Time Calculation (min): 15 min  Skilled Therapeutic Interventions/Progress Updates: patient in group [employer] with focus on attending to left side and tray.  Patient required Min cues to attend to "alien" left hand and keep in lap if not using as stablizer  Therapy Documentation Precautions:   Pain: 0  See FIM for current functional status  Therapy/Group: Group Therapy  Rozelle Logan 12/29/2011, 4:41 PM

## 2011-12-29 NOTE — Progress Notes (Signed)
Pt refused at midnight to use the bathroom or urinal stating he didn't feel the urge to void. Will continue to monitor as the shift progresses.

## 2011-12-29 NOTE — Progress Notes (Signed)
Patient ID: Craig [name], male   DOB: 11/16/1950, 61 y.o.   MRN: [id number] Patient ID: Craig [name], male   DOB: 02/15/1951, 61 y.o.   MRN: [id number]  Subjective/Complaints: No new complaints Review of Systems  HENT: Positive for hearing loss.     Objective: Vital Signs: Blood pressure 138/87, pulse 61, temperature 98 F (36.7 C), temperature source Oral, resp. rate 16, height 5\' 10"  (1.778 m), weight 88.2 kg (194 lb 7.1 oz), SpO2 100.00%. No results found. No results found for this or any previous visit (from the past 72 hour(s)).  Physical Exam  Nursing note and vitals reviewed.  Constitutional: He is oriented to person, place, and time. He appears well-developed.  HENT: Oral mucosa pink and moist.  Head: Normocephalic and atraumatic.  Eyes: Conjunctivae are normal. Pupils are equal, round, and reactive to light.  Neck: Normal range of motion. Neck supple. No thyromegaly present.  Cardiovascular: Normal rate. Regular rhythm without murmurs rubs or gallops.  Pulmonary/Chest: Effort normal and breath sounds normal. He has no wheezes, rales, rhonchi.  Abdominal: He exhibits no distension. There is no tenderness. Bowel sounds are positive  Musculoskeletal: He exhibits no edema.  Neurological: He is alert and oriented to person, place, and time.  Left visual field cut.Patient with mild apraxia and decreased fine motor skills. Left arm and leg are quite ataxic.  He is left hemisensory loss severe  He has diminished attention to the left side as well today. Cognitively he is quite appropriate and pleasant. Insight and awareness are fair. L homonymous hemianopsia.  Reduced finger to thumb opposition Skin: Skin is warm and dry.   Assessment/Plan: 1. 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 Physiatrist is providing close team supervision and 24 hour management  of active medical problems listed below. Physiatrist and rehab team continue to assess barriers to discharge/monitor patient progress toward functional and medical goals. FIM: FIM - Bathing Bathing Steps Patient Completed: Chest;Right Arm;Left Arm;Abdomen;Front perineal area;Buttocks;Right upper leg;Left upper leg Bathing: 4: Min-Patient completes 8-9 90f 10 parts or 75+ percent  FIM - Upper Body Dressing/Undressing Upper body dressing/undressing steps patient completed: Put head through opening of pull over shirt/dress;Pull shirt over trunk Upper body dressing/undressing: 3: Mod-Patient completed 50-74% of tasks FIM - Lower Body Dressing/Undressing Lower body dressing/undressing steps patient completed: Thread/unthread right underwear leg;Thread/unthread left underwear leg;Thread/unthread right pants leg;Thread/unthread left pants leg;Don/Doff right sock;Don/Doff left sock;Don/Doff right shoe;Don/Doff left shoe Lower body dressing/undressing: 4: Min-Patient completed 75 plus % of tasks  FIM - Toileting Toileting steps completed by patient: Adjust clothing prior to toileting Toileting Assistive Devices: Grab bar or rail for support Toileting: 2: Max-Patient completed 1 of 3 steps  FIM - Archivist Transfers: 3-To toilet/BSC: Mod A (lift or lower assist);3-From toilet/BSC: Mod A (lift or lower assist)  FIM - Bed/Chair Transfer Bed/Chair Transfer Assistive Devices: Therapist, occupational: 4: Bed > Chair or W/C: Min A (steadying Pt. > 75%);4: Chair or W/C > Bed: Min A (steadying Pt. > 75%)  FIM - Locomotion: Wheelchair Distance: 150 with bilat LE alternating propulsion with assistance for sequencing and coordination of L and R LE and for safety and sequence with turning. Locomotion: Wheelchair: 1: Total Assistance/staff pushes wheelchair (Pt<25%) FIM - Locomotion: Ambulation Locomotion: Ambulation Assistive Devices: Walker - Rolling;Other (comment) (wall  rail) Ambulation/Gait Assistance: 3: Mod assist Locomotion: Ambulation: 2: Travels 50 - 149 ft  with moderate assistance (Pt: 50 - 74%)  Comprehension Comprehension Mode: Auditory Comprehension: 5-Understands complex 90% of the time/Cues < 10% of the time  Expression Expression Mode: Verbal Expression: 5-Expresses complex 90% of the time/cues < 10% of the time  Social Interaction Social Interaction: 6-Interacts appropriately with others with medication or extra time (anti-anxiety, antidepressant).  Problem Solving Problem Solving: 3-Solves basic 50 - 74% of the time/requires cueing 25 - 49% of the time  Memory Memory Mode: Asleep Memory: 5-Recognizes or recalls 90% of the time/requires cueing < 10% of the time   Medical Problem List and Plan:  1. 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 DVT Prophylaxis/Anticoagulation: SCDs and ambulation.  3. Hypertension. Norvasc 10 mg daily, hydrochlorothiazide 25 mg daily, Avapro 300 mg daily, bystolic 10 mg daily. BP well controlled. Avoid hypotension  4. Elevated hemoglobin A1c: Sugars at this point are essentially within normal limits. 5. Mood: pt very upbeat.   LOS (Days) 19 A FACE TO FACE EVALUATION WAS PERFORMED  Lester Platas T 12/29/2011, 8:19 AM

## 2011-12-29 NOTE — Progress Notes (Signed)
Speech Language Pathology Daily Session Note [name]  Patient Details  Name: Craig [name] MRN: [id number] Date of Birth: 1950/12/25  Today's Date: 12/29/2011 Time: 1130-1200 Time Calculation (min): 30 min  Short Term Goals:  SLP Short Term Goal 1 (Week 3): Patient will attend to left of environment with modified independence SLP Short Term Goal 2 (Week 3): Patient will attend to left duirng reading and writing tasks with supervision sematnic cues SLP Short Term Goal 3 (Week 3): Patient will request help with minimal assist semantic cues  SLP Short Term Goal 4 (Week 3): Patient will utilize compensatory strategies for safe placement of left upper extremity to avoid encountering objects with minimal assist semantic cues   Skilled Therapeutic Interventions: Session focused on group treatment with SLP facilitating left attention to upper extremity with minimal assist semantic and tactile cues and to utilize compensatory strategy for planting upper extremity in a safe place when not in use. Patient also required minimal assist semantic cues to attend to left of environment during session.   Daily Session Precautions/Restrictions  Restrictions Weight Bearing Restrictions: No FIM:  Comprehension Comprehension: 5-Understands complex 90% of the time/Cues < 10% of the time Expression Expression: 4-Expresses basic 75 - 89% of the time/requires cueing 10 - 24% of the time. Needs helper to occlude trach/needs to repeat words. Social Interaction Social Interaction: 6-Interacts appropriately with others with medication or extra time (anti-anxiety, antidepressant). Problem Solving Problem Solving: 3-Solves basic 50 - 74% of the time/requires cueing 25 - 49% of the time Memory Memory: 5-Recognizes or recalls 90% of the time/requires cueing < 10% of the time FIM - Eating Eating Activity: 5: Set-up assist for open containers Pain Pain Assessment Pain Assessment: No/denies pain  Therapy/Group:  Dysphagia Group  Craig [name] 12/29/2011, 12:40 PM

## 2011-12-29 NOTE — Progress Notes (Signed)
Occupational Therapy Session Note  Patient Details  Name: Craig [name] MRN: [id number] Date of Birth: September 02, 1951  Today's Date: 12/29/2011 Time: 4098-1191 Time Calculation (min): 29 min  Skilled Therapeutic Interventions/Progress Updates:    Pt seen for OT treatment focus of session on tub/shower transfers to tub shower bench.  Pt needs mod instructional cues to position the LUE and LLE prior to transfer, but is able to perform with overall min assist level.  Also utilized the RW once back to the room to get back to the recliner.  Pt unable to maintain grasp on the left side of the walker when visual input was lost.  Therapy Documentation Precautions:  Precautions Precautions: Fall Precaution Comments: Lt inattention, Lt alien arm, Lt field cut Restrictions Weight Bearing Restrictions: No  Pain: Pain Assessment Pain Assessment: No/denies pain ADL: See FIM for current functional status  Therapy/Group: Individual Therapy  Craig [name] 12/29/2011, 4:11 PM

## 2011-12-29 NOTE — Progress Notes (Signed)
Pt with last BM 4/24, refused miralax, states "let nature take its course

## 2011-12-30 NOTE — Progress Notes (Signed)
Physical Therapy Note  Patient Details  Name: Craig [name] MRN: [id number] Date of Birth: 1951/04/25 Today's Date: 12/30/2011  1100-1155 (55 minutes) group Pain: no complaint of pain Pt participated in PT group focusing on gait training/safety/endurance. Pt ambulates with RW min/mod assist 100 feet with adducted Lt LE and difficulty maintaining walker grip on left. Ambulated X 2 80 feet HHA (mod assist ) with vcs to broaden BOS on left.    Clotiel Troop,JIM 12/30/2011, 12:38 PM

## 2011-12-30 NOTE — Progress Notes (Signed)
Patient ID: Craig [name], male   DOB: 14-Jun-1951, 61 y.o.   MRN: [id number] Patient ID: Craig [name], male   DOB: [birth year], 61 y.o.   MRN: [id number] Patient ID: Craig [name], male   DOB: [birth year], 61 y.o.   MRN: [id number]  Subjective/Complaints: No new complaints Review of Systems  HENT: Positive for hearing loss.     Objective: Vital Signs: Blood pressure 146/88, pulse 55, temperature 97.9 F (36.6 C), temperature source Oral, resp. rate 16, height 5\' 10"  (1.778 m), weight 88.2 kg (194 lb 7.1 oz), SpO2 100.00%. No results found. No results found for this or any previous visit (from the past 72 hour(s)).  Physical Exam  Nursing note and vitals reviewed.  Constitutional: He is oriented to person, place, and time. He appears well-developed.  HENT: Oral mucosa pink and moist.  Head: Normocephalic and atraumatic.  Eyes: Conjunctivae are normal. Pupils are equal, round, and reactive to light.  Neck: Normal range of motion. Neck supple. No thyromegaly present.  Cardiovascular: Normal rate. Regular rhythm without murmurs rubs or gallops.  Pulmonary/Chest: Effort normal and breath sounds normal. He has no wheezes, rales, rhonchi.  Abdominal: He exhibits no distension. There is no tenderness. Bowel sounds are positive  Musculoskeletal: He exhibits no edema.  Neurological: He is alert and oriented to person, place, and time.  Left visual field cut.Patient with mild apraxia and decreased fine motor skills. Left arm and leg are quite ataxic.  He is left hemisensory loss severe  He has diminished attention to the left side as well today. Cognitively he is quite appropriate and pleasant. Insight and awareness are fair. L homonymous hemianopsia.  Reduced finger to thumb opposition Skin: Skin is warm and dry.   Assessment/Plan: 1. 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 Physiatrist is providing close team supervision and 24 hour management of active medical problems listed below. Physiatrist and rehab team continue to assess barriers to discharge/monitor patient progress toward functional and medical goals. FIM: FIM - Bathing Bathing Steps Patient Completed: Chest;Right Arm;Left Arm;Abdomen;Front perineal area;Buttocks;Right upper leg;Left upper leg Bathing: 4: Min-Patient completes 8-9 66f 10 parts or 75+ percent  FIM - Upper Body Dressing/Undressing Upper body dressing/undressing steps patient completed: Put head through opening of pull over shirt/dress;Pull shirt over trunk Upper body dressing/undressing: 3: Mod-Patient completed 50-74% of tasks FIM - Lower Body Dressing/Undressing Lower body dressing/undressing steps patient completed: Thread/unthread right underwear leg;Thread/unthread left underwear leg;Thread/unthread right pants leg;Thread/unthread left pants leg;Don/Doff right sock;Don/Doff left sock Lower body dressing/undressing: 4: Min-Patient completed 75 plus % of tasks  FIM - Toileting Toileting steps completed by patient: Adjust clothing prior to toileting Toileting Assistive Devices: Grab bar or rail for support Toileting: 2: Max-Patient completed 1 of 3 steps  FIM - Archivist Transfers: 3-From toilet/BSC: Mod A (lift or lower assist);3-To toilet/BSC: Mod A (lift or lower assist)  FIM - Banker Devices: Manufacturing systems engineer Transfer: 4: Chair or W/C > Bed: Min A (steadying Pt. > 75%);4: Sit > Supine: Min A (steadying pt. > 75%/lift 1 leg);4: Bed > Chair or W/C: Min A (steadying Pt. > 75%)  FIM - Locomotion: Wheelchair Distance: 150 with bilat LE alternating propulsion with assistance for sequencing and coordination of L and R LE and for safety and sequence with turning. Locomotion: Wheelchair: 1: Total Assistance/staff pushes wheelchair (Pt<25%) FIM - Locomotion:  Ambulation Locomotion:  Ambulation Assistive Devices: Walker - Rolling;Other (comment) (wall rail) Ambulation/Gait Assistance: 3: Mod assist Locomotion: Ambulation: 1: Travels 50 - 149 ft with total assistance/helper does all (Pt.< 25%)  Comprehension Comprehension Mode: Auditory Comprehension: 5-Understands complex 90% of the time/Cues < 10% of the time  Expression Expression Mode: Verbal Expression: 5-Expresses basic 90% of the time/requires cueing < 10% of the time.  Social Interaction Social Interaction: 6-Interacts appropriately with others with medication or extra time (anti-anxiety, antidepressant).  Problem Solving Problem Solving: 3-Solves basic 50 - 74% of the time/requires cueing 25 - 49% of the time  Memory Memory Mode: Asleep Memory: 5-Recognizes or recalls 90% of the time/requires cueing < 10% of the time   Medical Problem List and Plan:  1. 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 DVT Prophylaxis/Anticoagulation: SCDs and ambulation.  3. Hypertension. Norvasc 10 mg daily, hydrochlorothiazide 25 mg daily, Avapro 300 mg daily, bystolic 10 mg daily. BP with fair control. HTN ed by staff. 4. Elevated hemoglobin A1c: Sugars at this point are essentially within normal limits. 5. Mood: pt very upbeat. Excited to go home this week.  LOS (Days) 20 A FACE TO FACE EVALUATION WAS PERFORMED  , T 12/30/2011, 7:05 AM

## 2011-12-31 NOTE — Progress Notes (Signed)
Speech Language Pathology Daily Session Note  Patient Details  Name: Craig [name] MRN: [id number] Date of Birth: [birth year]  Today's Date: 12/31/2011 Time: 1130-1200 Time Calculation (min): 30 min  Short Term Goals: Week 3: SLP Short Term Goal 1 (Week 3): Patient will attend to left of environment with modified independence SLP Short Term Goal 2 (Week 3): Patient will attend to left duirng reading and writing tasks with supervision sematnic cues SLP Short Term Goal 3 (Week 3): Patient will request help with minimal assist semantic cues  SLP Short Term Goal 4 (Week 3): Patient will utilize compensatory strategies for safe placement of left upper extremity to avoid encountering objects with minimal assist semantic cues   Skilled Therapeutic Interventions: Group Session focused on cognition treatment with SLP facilitating session with moderate assist semantic cues to attend to left upper extremity and demonstrate functional problem solving during self feeding.     Daily Session Pain Pain Assessment Pain Assessment: No/denies pain Pain Score: 0-No pain  Therapy/Group: Group Therapy  Charlane Ferretti., CCC-SLP [phone]  Kyley Solow 12/31/2011, 1:22 PM

## 2011-12-31 NOTE — Progress Notes (Addendum)
Occupational Therapy Session Note  Patient Details  Name: Craig [name] MRN: [id number] Date of Birth: 26-Apr-1951  Today's Date: 12/31/2011 Time:1) 7:25-8:17am 52 Min  2) 956-1030am Time Calculation (min): 86 min  Short Term Goals: Week 3:  OT Short Term Goal 1 (Week 3): Pt will complete UB dressing with min assist and min verbal cues for setup and sequencing OT Short Term Goal 2 (Week 3): Pt will complete squat pivot transfer with min assist with min verbal cues 2/3 sessions OT Short Term Goal 3 (Week 3): Pt will complete LB dressing with min assist OT Short Term Goal 4 (Week 3): Pt will complete 2 of 3 toileting steps 2/3 sessions  Skilled Therapeutic Interventions/Progress Updates:   #1)  Pt seen this am for ADL/selfcare for B/D shower level. Focus on atten. to Left and SPT as well as sit to stand tasks during ADL's and selfcare. Pt requires vc for L sided lean in shower as well as during transfers and is able to self correct w/ Min cues noted. Pt anticipates that wife will be available for teaching/education at 10 am session later today. (#2) Pt seen for second OT session today with focus on transfers in/out tub using tub bench in ADL bathroom, pt is overall Min A w/ vc's noted. Pt also had "accident" prior to OT arrival (attempted to use urinal after SLP, and was noted to soil his shirt, pants, underwear and towel that he was sitting on) therefore pt was assisted in changing all of his clothing. Pt somewhat concerned that his wife was not present and discussed that pt can schedule a time for his wife to observe/participate ADL's and especially tub transfers possibly tomorrow.   Therapy Documentation Precautions:  Precautions Precautions: Fall Precaution Comments: Lt inattention, Lt alien arm, Lt field cut Restrictions Weight Bearing Restrictions: No     Pain: Pain Assessment Pain Assessment: No/denies pain Pain Score: 0-No pain   Other Treatments:  Changed clothing during second  session today (12/31/11) due to pt having had "accident" w/ urinal and soiled clothing.  See FIM for current functional status  Therapy/Group: Individual Therapy  Alm Bustard 12/31/2011, 12:19 PM

## 2011-12-31 NOTE — Progress Notes (Signed)
Patient ID: Craig [name], male   DOB: 1950-12-08, 61 y.o.   MRN: [id number]  Subjective/Complaints: No new complaints, missed breakfast this am Review of Systems  HENT: Positive for hearing loss.     Objective: Vital Signs: Blood pressure 144/89, pulse 69, temperature 98.3 F (36.8 C), temperature source Oral, resp. rate 16, height 5\' 10"  (1.778 m), weight 88.2 kg (194 lb 7.1 oz), SpO2 99.00%. No results found. No results found for this or any previous visit (from the past 72 hour(s)).  Physical Exam  Nursing note and vitals reviewed.  Constitutional: He is oriented to person, place, and time. He appears well-developed.  HENT: Oral mucosa pink and moist.  Head: Normocephalic and atraumatic.  Eyes: Conjunctivae are normal. Pupils are equal, round, and reactive to light.  Neck: Normal range of motion. Neck supple. No thyromegaly present.  Cardiovascular: Normal rate. Regular rhythm without murmurs rubs or gallops.  Pulmonary/Chest: Effort normal and breath sounds normal. He has no wheezes, rales, rhonchi.  Abdominal: He exhibits no distension. There is no tenderness. Bowel sounds are positive  Musculoskeletal: He exhibits no edema.  Neurological: He is alert and oriented to person, place, and time.  Left visual field cut.Patient with mild apraxia and decreased fine motor skills. Left arm and leg are quite ataxic.  He is left hemisensory loss severe  He has diminished attention to the left side as well today. Cognitively he is quite appropriate and pleasant. Insight and awareness are fair. L homonymous hemianopsia.  Reduced finger to thumb opposition Skin: Skin is warm and dry.   Assessment/Plan: 1. 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 Physiatrist is providing close team supervision and 24 hour management of active medical problems listed  below. Physiatrist and rehab team continue to assess barriers to discharge/monitor patient progress toward functional and medical goals. FIM: FIM - Bathing Bathing Steps Patient Completed: Chest;Right Arm;Left Arm;Abdomen;Front perineal area;Buttocks;Right upper leg;Left upper leg Bathing: 4: Min-Patient completes 8-9 20f 10 parts or 75+ percent  FIM - Upper Body Dressing/Undressing Upper body dressing/undressing steps patient completed: Thread/unthread left sleeve of pullover shirt/dress;Put head through opening of pull over shirt/dress;Thread/unthread right sleeve of pullover shirt/dresss;Pull shirt over trunk Upper body dressing/undressing: 3: Mod-Patient completed 50-74% of tasks FIM - Lower Body Dressing/Undressing Lower body dressing/undressing steps patient completed: Thread/unthread right underwear leg;Thread/unthread left underwear leg;Pull underwear up/down;Thread/unthread right pants leg;Thread/unthread left pants leg;Don/Doff right sock;Don/Doff left sock;Pull pants up/down Lower body dressing/undressing: 4: Min-Patient completed 75 plus % of tasks  FIM - Toileting Toileting steps completed by patient: Adjust clothing prior to toileting;Performs perineal hygiene;Adjust clothing after toileting Toileting Assistive Devices: Grab bar or rail for support Toileting: 2: Max-Patient completed 1 of 3 steps  FIM - Archivist Transfers: 3-From toilet/BSC: Mod A (lift or lower assist);3-To toilet/BSC: Mod A (lift or lower assist)  FIM - Banker Devices: Manufacturing systems engineer Transfer: 4: Chair or W/C > Bed: Min A (steadying Pt. > 75%);4: Sit > Supine: Min A (steadying pt. > 75%/lift 1 leg);4: Bed > Chair or W/C: Min A (steadying Pt. > 75%)  FIM - Locomotion: Wheelchair Distance: 150 with bilat LE alternating propulsion with assistance for sequencing and coordination of L and R LE and for safety and sequence with turning. Locomotion:  Wheelchair: 1: Total Assistance/staff pushes wheelchair (Pt<25%) FIM - Locomotion: Ambulation Locomotion: Ambulation Assistive Devices: Walker - Rolling;Other (comment) (  wall rail) Ambulation/Gait Assistance: 3: Mod assist Locomotion: Ambulation: 1: Travels 50 - 149 ft with total assistance/helper does all (Pt.< 25%)  Comprehension Comprehension Mode: Auditory Comprehension: 6-Follows complex conversation/direction: With extra time/assistive device  Expression Expression Mode: Verbal Expression: 6-Expresses complex ideas: With extra time/assistive device  Social Interaction Social Interaction: 6-Interacts appropriately with others with medication or extra time (anti-anxiety, antidepressant).  Problem Solving Problem Solving: 6-Solves complex problems: With extra time  Memory Memory Mode: Asleep Memory: 6-More than reasonable amt of time   Medical Problem List and Plan:  1. 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 DVT Prophylaxis/Anticoagulation: SCDs and ambulation.  3. Hypertension. Norvasc 10 mg daily, hydrochlorothiazide 25 mg daily, Avapro 300 mg daily, bystolic 10 mg daily. BP with fair control. HTN ed by staff. 4. Elevated hemoglobin A1c: Sugars at this point are essentially within normal limits. 5. Mood: no sign of post CVA depression  LOS (Days) 21 A FACE TO FACE EVALUATION WAS PERFORMED  Hassani Sliney E 12/31/2011, 7:52 AM

## 2011-12-31 NOTE — Progress Notes (Signed)
Physical Therapy Weekly Progress Note  Patient Details  Name: Craig [name] MRN: [id number] Date of Birth: 1951-03-05  Today's Date: 12/31/2011 Time: 4098-1191 Time Calculation (min): 48 min  Patient continues to make steady progress towards PT LTG and has met 2 of 3 short term goals.  Patient has progressed to supervision-min A level for bed mobility and basic transfers mat <> w/c, mod A overall gait with RW in controlled/mildly distracting environment and mod-max A for w/c mobility with use of bilat LE and stair negotiation with RW.  Patient is scheduled to D/C home at the end of this week; wife to be present for therapy and education this week.    Patient continues to demonstrate the following deficits: L sided weakness, impaired muscle timing and sequencing, impaired coordination, apraxia, ataxia, impaired sensation and proprioception, impaired attention to L side and L environment, impaired problem solving, impaired postural control, balance and gait and therefore will continue to benefit from skilled PT intervention to enhance overall performance with balance, postural control, ability to compensate for deficits, functional use of  left upper extremity and left lower extremity, attention, awareness and coordination.  Patient progressing toward long term goals..  Continue plan of care.  PT Short Term Goals Week 1:  PT Short Term Goal 1 (Week 1): Patient will perform bed mobility to L and R on flat bed with consistent mod A PT Short Term Goal 1 - Progress (Week 1): Progressing toward goal PT Short Term Goal 2 (Week 1): Patient will perform bed <> w/c transfers to L and R with consistent mod A and mod verbal cues for attention and placement of LLE/UE PT Short Term Goal 2 - Progress (Week 1): Progressing toward goal PT Short Term Goal 3 (Week 1): Patient will perform w/c mobility on unit x 150 with mod A with use of R hemi technique or bilat LE propulsion with increased attention to L  side/environment PT Short Term Goal 3 - Progress (Week 1): Met PT Short Term Goal 4 (Week 1): Patient will perform standing balance, postural control and pre-gait training bilat UE supported max A with focus on improved orientation to midline, weight shift and controlled advancement of LLE PT Short Term Goal 4 - Progress (Week 1): Met Week 2:  PT Short Term Goal 1 (Week 2): Patient will perform bed mobility to L and R on flat bed with consistent mod A  PT Short Term Goal 1 - Progress (Week 2): Met PT Short Term Goal 2 (Week 2): Patient will perform bed <> w/c transfers to L and R with consistent mod A and mod verbal cues for attention and placement of LLE/UE  PT Short Term Goal 2 - Progress (Week 2): Met PT Short Term Goal 3 (Week 2): Patient will perform w/c mobility in mildly distracting environment with improved attention to L side and obstacle negotiation with supervision and verbal cues for safety PT Short Term Goal 3 - Progress (Week 2): Progressing toward goal PT Short Term Goal 4 (Week 2): Patient will perform gait training in controlled environment x 50' with mod A with improved sustained activation and coordination of LLE PT Short Term Goal 4 - Progress (Week 2): Progressing toward goal Week 3:  PT Short Term Goal 1 (Week 3): Patient will perform w/c mobility in mildly distracting environment with improved attention to L side and obstacle negotiation with supervision and verbal cues for safety PT Short Term Goal 1 - Progress (Week 3): Progressing toward goal PT Short Term  Goal 2 (Week 3): Patient will perform gait training in controlled environment x 100' with mod A with improved sustained activation and coordination of LLE  PT Short Term Goal 2 - Progress (Week 3): Met PT Short Term Goal 3 (Week 3): Patient will perform bed mobility on flat bed/mat with supervision and bed <> w/c transfers to L and R consistent min A PT Short Term Goal 3 - Progress (Week 3): Met Week 4:  PT Short Term  Goal 1 (Week 4): = LTG secondary to D/C this week at supervision/min A level overall  Therapy Documentation Precautions:  Precautions Precautions: Fall Precaution Comments: Lt inattention, Lt alien arm, Lt field cut Restrictions Weight Bearing Restrictions: No Pain: Pain Assessment Pain Assessment: No/denies pain Pain Score: 0-No pain Locomotion : Ambulation Ambulation/Gait Assistance: 4: Min assist High Level Ambulation High Level Ambulation: Side stepping Side Stepping: Wife reports that RW at home is too wide to fit through bathroom door forwards; discussed side stepping through door and then sitting on tub bench.  In ADL apartment demonstrated to wife how to perform L and R side stepping through narrow door with RW with tactile and verbal cues for L hand placement on RW and verbal cues for safety and sequence with RW   Other Treatments: Treatments Therapeutic Activity: Wife present for family education; discussed possible equipment needs for gait and longer distances in community; wife reports that they have a RW at home and will bring it in as well as bring doorway measurements.  Demonstrated to wife how patient performed tub bench transfers with min A to bring LLE in and out of tub, with verbal cues for attention to L side and for sequencing.  Also demonstrated bed <> w/c transfers x 2 reps to L and R with min A for w/c set up and cues for squat pivot sequence.  Patient demonstrated sit <> supine and rolling in bed with supervision for safety and for attention to L side and positioning of LUE and LLE.  Wife concerned that the patient will roll off in the floor, patient demonstrated to wife floor > bed transfer prone > quadruped > tall kneeling > half kneeling > stand to sit on bed with supervision but max verbal cues for sequence and attention to L side.  Wife did not get to return demonstrate but she plans to be at each therapy session this week.  See FIM for current functional  status  Therapy/Group: Individual Therapy  Edman Circle [facility] 12/31/2011, 5:15 PM

## 2011-12-31 NOTE — Progress Notes (Signed)
Speech Language Pathology Daily Session Note  Patient Details  Name: Craig [name] MRN: [id number] Date of Birth: [birth year]  Today's Date: 12/31/2011 Time: 7829-5621 Time Calculation (min): 45 min  Short Term Goals: Week 3: SLP Short Term Goal 1 (Week 3): Patient will attend to left of environment with modified independence SLP Short Term Goal 2 (Week 3): Patient will attend to left duirng reading and writing tasks with supervision sematnic cues SLP Short Term Goal 3 (Week 3): Patient will request help with minimal assist semantic cues  SLP Short Term Goal 4 (Week 3): Patient will utilize compensatory strategies for safe placement of left upper extremity to avoid encountering objects with minimal assist semantic cues   Skilled Therapeutic Interventions: Treatment session focused on cognition treatment; SLP facilitated session with questions regarding therapy schedule and provided left highlighting as well as minimal assist semantic cues and increased wait time to locate information on left of schedule; Patient able to verbalize need for use of urinal and was able to direct SLP to pull curtain and then requested assistance with propelling wheelchair so that he could complete hand hygiene SLP facilitated completing that BADL with supervision semantic cues to problem solve during that activity.  Patient expressed concern that his wife was not there and that she was late because she was supposed to be there at 10am; SLP facilitated time management task with mod-max assist semantic and visual cues to tell time and figure how much time was left until 10 am.  Daily Session Precautions/Restrictions    FIM:  Comprehension Comprehension Mode: Auditory Comprehension: 5-Understands complex 90% of the time/Cues < 10% of the time Expression Expression Mode: Verbal Expression: 6-Expresses complex ideas: With extra time/assistive device Social Interaction Social Interaction: 6-Interacts appropriately  with others with medication or extra time (anti-anxiety, antidepressant). Problem Solving Problem Solving: 5-Solves basic 90% of the time/requires cueing < 10% of the time Memory Memory: 5-Recognizes or recalls 90% of the time/requires cueing < 10% of the time General    Pain Pain Assessment Pain Assessment: No/denies pain Pain Score: 0-No pain  Therapy/Group: Individual Therapy  Charlane Ferretti., CCC-SLP [phone]  Samual Beals 12/31/2011, 10:45 AM

## 2011-12-31 NOTE — Progress Notes (Signed)
Occupational Therapy Session Note  Patient Details  Name: Terrian Sentell MRN: [id number] Date of Birth: Nov 09, 1950  Today's Date: 12/31/2011 Time: 1200-1225 Time Calculation (min): 25 min   Skilled Therapeutic Interventions/Progress Updates:    Self feeding group.  Focus on visual scanning to the left of the environment for locating food items and utensils.  Incorporated the LUE as an active assist with mod facilitation for holding bread and dessert cup.    Therapy Documentation Precautions:  Precautions Precautions: Fall Precaution Comments: Lt inattention, Lt alien arm, Lt field cut Restrictions Weight Bearing Restrictions: No  Pain: Pain Assessment Pain Assessment: No/denies pain Pain Score: 0-No pain  See FIM for current functional status  Therapy/Group: Group Therapy  Tino Ronan 12/31/2011, 4:36 PM

## 2012-01-01 DIAGNOSIS — I69993 Ataxia following unspecified cerebrovascular disease: Secondary | ICD-10-CM

## 2012-01-01 DIAGNOSIS — I6330 Cerebral infarction due to thrombosis of unspecified cerebral artery: Secondary | ICD-10-CM

## 2012-01-01 DIAGNOSIS — Z5189 Encounter for other specified aftercare: Secondary | ICD-10-CM

## 2012-01-01 NOTE — Progress Notes (Signed)
Speech Language Pathology Daily Session Note  Patient Details  Name: Townes Fuhs MRN: [id number] Date of Birth: 1950-10-23  Today's Date: 01/01/2012 Time: 1200-1225 Time Calculation (min): 25 min  Short Term Goals: Week 3: SLP Short Term Goal 1 (Week 3): Patient will attend to left of environment with modified independence SLP Short Term Goal 2 (Week 3): Patient will attend to left duirng reading and writing tasks with supervision sematnic cues SLP Short Term Goal 3 (Week 3): Patient will request help with minimal assist semantic cues  SLP Short Term Goal 4 (Week 3): Patient will utilize compensatory strategies for safe placement of left upper extremity to avoid encountering objects with minimal assist semantic cues   Skilled Therapeutic Interventions: Group Session focused on cognition treatment with SLP facilitating session with moderate faded to minimal assist semantic cues to attend to left upper extremity and demonstrate functional problem solving and sequencing during self feeding.   Daily Session Pain Pain Assessment Pain Assessment: No/denies pain Pain Score: 0-No pain  Therapy/Group: Group Therapy  Charlane Ferretti., CCC-SLP [phone]  Maddisyn Hegwood 01/01/2012, 4:02 PM

## 2012-01-01 NOTE — Progress Notes (Signed)
Occupational Therapy Session Note  Patient Details  Name: Craig [name] MRN: [id number] Date of Birth: 1950/09/30  Today's Date: 01/01/2012 Time: 07:28-08:26am Time Calculation (min): 58 min  Short Term Goals: Week 2:  OT Short Term Goal 1 (Week 2): Pt will complete UB dressing with min assist and min verbal cues for setup and sequencing OT Short Term Goal 1 - Progress (Week 2): Progressing toward goal OT Short Term Goal 2 (Week 2): Pt will complete squat pivot transfer with min assist and min verbal cues 2/3 sessions OT Short Term Goal 2 - Progress (Week 2): Progressing toward goal OT Short Term Goal 3 (Week 2): Pt will complete LB dressing with min assist with AE prn (elastic shoelaces) OT Short Term Goal 3 - Progress (Week 2): Progressing toward goal OT Short Term Goal 4 (Week 2): Pt will complete 2 of 3 toileting steps 2/3 sessions OT Short Term Goal 4 - Progress (Week 2): Progressing toward goal  Skilled Therapeutic Interventions/Progress Updates:    Pt seen today for OT with focus on bathing, dressing and tub transfer bench in ADL bathroom. Pt currently requires Min A tub bench transfers and vc's for L body/environment awareness as well as safety. Discussed importance to pt/family education and pt's wife agreeable to family education 01/02/12 in am. Pt able to correct for L lean/decreased trunk awareness with VC's. Cont with difficulty in areas of sequencing and motor planning/L awareness.  Therapy Documentation Precautions:  Precautions Precautions: Fall Precaution Comments: Lt inattention, Lt alien arm, Lt field cut Restrictions Weight Bearing Restrictions: No     Pain: Pain Assessment Pain Assessment: No/denies pain Pain Score: 0-No pain Faces Pain Scale: No hurt      See FIM for current functional status  Therapy/Group: Individual Therapy  Alm Bustard 01/01/2012, 1:05 PM

## 2012-01-01 NOTE — Progress Notes (Signed)
Physical Therapy Session Note  Patient Details  Name: Cloyde Oregel MRN: [id number] Date of Birth: 02/24/1951  Today's Date: 01/01/2012 Time: 1002-1102 Time Calculation (min): 60 min  Short Term Goals: Week 1:  PT Short Term Goal 1 (Week 1): Patient will perform bed mobility to L and R on flat bed with consistent mod A PT Short Term Goal 1 - Progress (Week 1): Progressing toward goal PT Short Term Goal 2 (Week 1): Patient will perform bed <> w/c transfers to L and R with consistent mod A and mod verbal cues for attention and placement of LLE/UE PT Short Term Goal 2 - Progress (Week 1): Progressing toward goal PT Short Term Goal 3 (Week 1): Patient will perform w/c mobility on unit x 150 with mod A with use of R hemi technique or bilat LE propulsion with increased attention to L side/environment PT Short Term Goal 3 - Progress (Week 1): Met PT Short Term Goal 4 (Week 1): Patient will perform standing balance, postural control and pre-gait training bilat UE supported max A with focus on improved orientation to midline, weight shift and controlled advancement of LLE PT Short Term Goal 4 - Progress (Week 1): Met Week 2:  PT Short Term Goal 1 (Week 2): Patient will perform bed mobility to L and R on flat bed with consistent mod A  PT Short Term Goal 1 - Progress (Week 2): Met PT Short Term Goal 2 (Week 2): Patient will perform bed <> w/c transfers to L and R with consistent mod A and mod verbal cues for attention and placement of LLE/UE  PT Short Term Goal 2 - Progress (Week 2): Met PT Short Term Goal 3 (Week 2): Patient will perform w/c mobility in mildly distracting environment with improved attention to L side and obstacle negotiation with supervision and verbal cues for safety PT Short Term Goal 3 - Progress (Week 2): Progressing toward goal PT Short Term Goal 4 (Week 2): Patient will perform gait training in controlled environment x 50' with mod A with improved sustained activation and  coordination of LLE PT Short Term Goal 4 - Progress (Week 2): Progressing toward goal Week 3:  PT Short Term Goal 1 (Week 3): Patient will perform w/c mobility in mildly distracting environment with improved attention to L side and obstacle negotiation with supervision and verbal cues for safety PT Short Term Goal 1 - Progress (Week 3): Progressing toward goal PT Short Term Goal 2 (Week 3): Patient will perform gait training in controlled environment x 100' with mod A with improved sustained activation and coordination of LLE  PT Short Term Goal 2 - Progress (Week 3): Met PT Short Term Goal 3 (Week 3): Patient will perform bed mobility on flat bed/mat with supervision and bed <> w/c transfers to L and R consistent min A PT Short Term Goal 3 - Progress (Week 3): Met  Week 4: Goals = LTG  Therapy Documentation Precautions:  Precautions Precautions: Fall Precaution Comments: Lt inattention, Lt alien arm, Lt field cut Restrictions Weight Bearing Restrictions: No Pain: Pain Assessment Pain Assessment: No/denies pain Pain Score: 0-No pain Faces Pain Scale: No hurt Mobility:  Wife brought in measurements of bathroom doorway and bed height; discussed that the RW they have at home may be too wide for patient; wife agreed to go ahead and order a RW for patient use to allow patient to walk through bathroom door forwards.  Will also order a w/c for community mobility.  Demonstrated to wife  and patient how to perform w/c <> car transfer with stand pivot with RW and with squat pivot and w/c parts management and how to break down w/c for storage in the car and set up for transfers; patient and wife gave repeat demonstration of stand pivot with RW and squat pivot to R and L with mod A and multiple verbal, visual and tactile cues for hand placement on splint and pivoting sequence; will need to reinforce sequence with real car transfer.  Locomotion : Ambulation: added L hand splint to RW to maintain hand  position during transfers and gait Ambulation/Gait Assistance: 4: Min assist for gait short distances in controlled environment with LUE in hand splint and verbal cues for placement/position of L foot; demonstrated to wife how patient performs one step up and down platform step with RW and mod A for LUE placement, RW placement and verbal cues for LE stepping sequence and safety; wife and patient gave repeat demonstration with min-mod A for sequence and safe RW placement; wife states patient's brother will likely be building a temporary ramp for porch access but discussed with wife that it is important to know this sequence for sidewalks, curbs, other porches, etc.  Will continue to practice.  See FIM for current functional status  Therapy/Group: Individual Therapy  Edman Circle [facility] 01/01/2012, 12:33 PM

## 2012-01-01 NOTE — Progress Notes (Signed)
Social Work Patient ID: Craig [name], male   DOB: 1950-10-11, 61 y.o.   MRN: [id number] Met with pt and wife who is here to begin family education in anticipation of discharge Friday.  Wife reports going well.  She plans to be here most of the week.  Discussed OP rehab at discharge and will order the DME once given by team.  Continue to work on  Discharge needs.

## 2012-01-01 NOTE — Progress Notes (Signed)
Patient ID: Craig [name], male   DOB: 1951/08/23, 61 y.o.   MRN: [id number]  Subjective/Complaints: Slept poorly, took nap yest afternoon Review of Systems  HENT: Positive for hearing loss.     Objective: Vital Signs: Blood pressure 147/87, pulse 64, temperature 98.1 F (36.7 C), temperature source Oral, resp. rate 18, height 5\' 10"  (1.778 m), weight 88.2 kg (194 lb 7.1 oz), SpO2 95.00%. No results found. No results found for this or any previous visit (from the past 72 hour(s)).  Physical Exam  Nursing note and vitals reviewed.  Constitutional: He is oriented to person, place, and time. He appears well-developed.  HENT: Oral mucosa pink and moist.  Head: Normocephalic and atraumatic.  Eyes: Conjunctivae are normal. Pupils are equal, round, and reactive to light.  Neck: Normal range of motion. Neck supple. No thyromegaly present.  Cardiovascular: Normal rate. Regular rhythm without murmurs rubs or gallops.  Pulmonary/Chest: Effort normal and breath sounds normal. He has no wheezes, rales, rhonchi.  Abdominal: He exhibits no distension. There is no tenderness. Bowel sounds are positive  Musculoskeletal: He exhibits no edema.  Neurological: He is alert and oriented to person, place, and time.  Left visual field cut.Patient with mild apraxia and decreased fine motor skills. Left arm and leg are quite ataxic.  He is left hemisensory loss severe  He has diminished attention to the left side as well today. Cognitively he is quite appropriate and pleasant. Insight and awareness are fair. L homonymous hemianopsia.  Reduced finger to thumb opposition Skin: Skin is warm and dry.   Assessment/Plan: 1. 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 Physiatrist is providing close team supervision and 24 hour management of active medical problems listed below. Physiatrist  and rehab team continue to assess barriers to discharge/monitor patient progress toward functional and medical goals. FIM: FIM - Bathing Bathing Steps Patient Completed: Chest;Abdomen;Left Arm;Front perineal area;Right upper leg;Left upper leg;Buttocks;Right Arm Bathing: 4: Min-Patient completes 8-9 64f 10 parts or 75+ percent  FIM - Upper Body Dressing/Undressing Upper body dressing/undressing steps patient completed: Thread/unthread left sleeve of pullover shirt/dress;Put head through opening of pull over shirt/dress;Pull shirt over trunk;Thread/unthread right sleeve of pullover shirt/dresss Upper body dressing/undressing: 3: Mod-Patient completed 50-74% of tasks FIM - Lower Body Dressing/Undressing Lower body dressing/undressing steps patient completed: Thread/unthread right underwear leg;Thread/unthread left underwear leg;Pull underwear up/down;Thread/unthread right pants leg;Thread/unthread left pants leg;Pull pants up/down;Don/Doff right sock;Don/Doff left sock;Don/Doff right shoe;Don/Doff left shoe Lower body dressing/undressing: 4: Min-Patient completed 75 plus % of tasks  FIM - Toileting Toileting steps completed by patient: Adjust clothing prior to toileting;Performs perineal hygiene;Adjust clothing after toileting Toileting Assistive Devices: Grab bar or rail for support Toileting: 2: Max-Patient completed 1 of 3 steps  FIM - Archivist Transfers: 3-From toilet/BSC: Mod A (lift or lower assist);3-To toilet/BSC: Mod A (lift or lower assist)  FIM - Bed/Chair Transfer Bed/Chair Transfer Assistive Devices: Therapist, occupational: 5: Supine > Sit: Supervision (verbal cues/safety issues);5: Sit > Supine: Supervision (verbal cues/safety issues);4: Bed > Chair or W/C: Min A (steadying Pt. > 75%);4: Chair or W/C > Bed: Min A (steadying Pt. > 75%)  FIM - Locomotion: Wheelchair Distance: 150 with bilat LE alternating propulsion with assistance for sequencing and coordination  of L and R LE and for safety and sequence with turning. Locomotion: Wheelchair: 1: Total Assistance/staff pushes wheelchair (Pt<25%) FIM - Locomotion: Ambulation Locomotion: Ambulation  Assistive Devices: Designer, industrial/product Ambulation/Gait Assistance: 4: Min assist Locomotion: Ambulation: 1: Travels less than 50 ft with minimal assistance (Pt.>75%)  Comprehension Comprehension Mode: Auditory Comprehension: 6-Follows complex conversation/direction: With extra time/assistive device  Expression Expression Mode: Verbal Expression: 6-Expresses complex ideas: With extra time/assistive device  Social Interaction Social Interaction: 6-Interacts appropriately with others with medication or extra time (anti-anxiety, antidepressant).  Problem Solving Problem Solving: 6-Solves complex problems: With extra time  Memory Memory Mode: Asleep Memory: 6-More than reasonable amt of time   Medical Problem List and Plan:  1. 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 DVT Prophylaxis/Anticoagulation: SCDs and ambulation.  3. Hypertension. Norvasc 10 mg daily, hydrochlorothiazide 25 mg daily, Avapro 300 mg daily, bystolic 10 mg daily. BP with fair control. HTN ed by staff. 4. Elevated hemoglobin A1c: Sugars at this point are essentially within normal limits. 5. 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

## 2012-01-01 NOTE — Progress Notes (Signed)
Speech Language Pathology Daily Session Note  Patient Details  Name: Craig [name] MRN: [id number] Date of Birth: [birth year]  Today's Date: 01/01/2012 Time: 0930-1000 Time Calculation (min): 30 min  Short Term Goals: Week 3: SLP Short Term Goal 1 (Week 3): Patient will attend to left of environment with modified independence SLP Short Term Goal 2 (Week 3): Patient will attend to left duirng reading and writing tasks with supervision sematnic cues SLP Short Term Goal 3 (Week 3): Patient will request help with minimal assist semantic cues  SLP Short Term Goal 4 (Week 3): Patient will utilize compensatory strategies for safe placement of left upper extremity to avoid encountering objects with minimal assist semantic cues   Skilled Therapeutic Interventions: Session focused on family education with wife present for education; SLP facilitated session by demonstrating cuing hierarchy with verbal, visual and finally tactile cues as needed to complete familiar tasks such as reading and writing with left attention to field and left upper extremity; SLP also educated on memory deficits that have resulted from CVA and how written aids are not effective due to visual and perceptual deficits.    Daily Session Precautions/Restrictions    FIM:  Comprehension Comprehension Mode: Auditory Comprehension: 5-Follows basic conversation/direction: With extra time/assistive device Expression Expression Mode: Verbal Expression: 6-Expresses complex ideas: With extra time/assistive device Social Interaction Social Interaction: 6-Interacts appropriately with others with medication or extra time (anti-anxiety, antidepressant). Problem Solving Problem Solving: 3-Solves basic 50 - 74% of the time/requires cueing 25 - 49% of the time Memory Memory: 5-Recognizes or recalls 90% of the time/requires cueing < 10% of the time FIM - Eating Eating Activity: 5: Supervision/cues;5: Set-up assist for open  containers General  Amount of Missed SLP Time (min): 15 Minutes Missed Time Reason: Not applicable Pain Pain Assessment Pain Assessment: No/denies pain Pain Score: 0-No pain  Therapy/Group: Individual Therapy  Charlane Ferretti., CCC-SLP [phone]  Craig [name] 01/01/2012, 3:37 PM

## 2012-01-01 NOTE — Progress Notes (Signed)
Occupational Therapy Session Note  Patient Details  Name: Craig [name] MRN: [id number] Date of Birth: 03-30-1951  Today's Date: 01/01/2012 Time: 11:35-12:00pm Time Calculation (min): 25 min  Short Term Goals: Week 3:  OT Short Term Goal 1 (Week 3): Pt will complete UB dressing with min assist and min verbal cues for setup and sequencing OT Short Term Goal 2 (Week 3): Pt will complete squat pivot transfer with min assist with min verbal cues 2/3 sessions OT Short Term Goal 3 (Week 3): Pt will complete LB dressing with min assist OT Short Term Goal 4 (Week 3): Pt will complete 2 of 3 toileting steps 2/3 sessions  Skilled Therapeutic Interventions/Progress Updates:    Pt seen in [employer] today with focus on self feeding and visual scanning to left. Pt very social today in group and had multiple conversations. VC for cutting food, using L as gross assist and Min assist to cut food/open packages.  Therapy Documentation Precautions:  Precautions Precautions: Fall Precaution Comments: Lt inattention, Lt alien arm, Lt field cut Restrictions Weight Bearing Restrictions: No     Pain: Pain Assessment Pain Assessment: No/denies pain Pain Score: 0-No pain Faces Pain Scale: No hurt    See FIM for current functional status  Therapy/Group: Group Therapy  Charletta Cousin, Nathaniel Yaden Beth Dixon 01/01/2012, 1:13 PM

## 2012-01-02 NOTE — Progress Notes (Addendum)
Occupational Therapy Session Note  Patient Details  Name: Craig [name] MRN: [id number] Date of Birth: [birth year]  Today's Date: 01/02/2012 Time: 0920-1005 Time Calculation (min): 45 min  Skilled Therapeutic Interventions/Progress Updates:    ADL's for bathing and dressing today with focus on Pt & Wife education/observation/performance tub transfers to tub transfer bench in ADL bathroom for shower. Pt requires Min A w/ vc's for safety and sequencing to/from tub bench, and SBA/Steady A stand in shower using grab bar to bathe peri area and buttocks. Pt's wife states that they have a grab bar on right side of tub at home for pt to use. UB/LB dressing using hemi techniques as per FIM. Pt will need tub bench, 3:1 at home, discussed w/ pt and his wife today. Pt's wife expressed/verbalized understanding of tub transfers and bathing/dressing.  Therapy Documentation Precautions:  Precautions Precautions: Fall Precaution Comments: Lt inattention, Lt alien arm, Lt field cut Restrictions Weight Bearing Restrictions: No     Pain: No 0/10  .      See FIM for current functional status  Therapy/Group: Individual Therapy  Alm Bustard 01/02/2012, 12:41 PM

## 2012-01-02 NOTE — Progress Notes (Signed)
Speech Language Pathology Daily Session Note  Patient Details  Name: Craig [name] MRN: [id number] Date of Birth: August 04, 1951  Today's Date: 01/02/2012 Time: 1200-1220 Time Calculation (min): 20 min  Short Term Goals: Week 3: SLP Short Term Goal 1 (Week 3): Patient will attend to left of environment with modified independence SLP Short Term Goal 2 (Week 3): Patient will attend to left duirng reading and writing tasks with supervision sematnic cues SLP Short Term Goal 3 (Week 3): Patient will request help with minimal assist semantic cues  SLP Short Term Goal 4 (Week 3): Patient will utilize compensatory strategies for safe placement of left upper extremity to avoid encountering objects with minimal assist semantic cues   Skilled Therapeutic Interventions: Group Session focused on cognition treatment; SLP facilitated session with min-mod assist semantic and tactile cues to sequence and problem solve set up of meal as well as moderate assist semantic cues to recognize and request help as needed.    Daily Session Precautions/Restrictions    FIM:  Comprehension Comprehension Mode: Auditory Comprehension: 5-Follows basic conversation/direction: With extra time/assistive device Expression Expression Mode: Verbal Expression: 5-Expresses complex 90% of the time/cues < 10% of the time Social Interaction Social Interaction: 6-Interacts appropriately with others with medication or extra time (anti-anxiety, antidepressant). Problem Solving Problem Solving: 3-Solves basic 50 - 74% of the time/requires cueing 25 - 49% of the time Memory Memory: 5-Recognizes or recalls 90% of the time/requires cueing < 10% of the time FIM - Eating Eating Activity: 5: Set-up assist for open containers;5: Set-up assist for cut food;5: Supervision/cues (Pt benefitted from plate guard today at lunch) General    Pain Pain Assessment Pain Assessment: No/denies pain Pain Score: 0-No pain  Therapy/Group: Group  Therapy  Charlane Ferretti., CCC-SLP [phone]  Addilynne Olheiser 01/02/2012, 1:24 PM

## 2012-01-02 NOTE — Progress Notes (Signed)
Physical Therapy Session Note  Patient Details  Name: Craig [name] MRN: [id number] Date of Birth: 02/01/1951  Today's Date: 01/02/2012 Time: 1400-1500 Time Calculation (min): 60 min  Short Term Goals: Week 1:  PT Short Term Goal 1 (Week 1): Patient will perform bed mobility to L and R on flat bed with consistent mod A PT Short Term Goal 1 - Progress (Week 1): Progressing toward goal PT Short Term Goal 2 (Week 1): Patient will perform bed <> w/c transfers to L and R with consistent mod A and mod verbal cues for attention and placement of LLE/UE PT Short Term Goal 2 - Progress (Week 1): Progressing toward goal PT Short Term Goal 3 (Week 1): Patient will perform w/c mobility on unit x 150 with mod A with use of R hemi technique or bilat LE propulsion with increased attention to L side/environment PT Short Term Goal 3 - Progress (Week 1): Met PT Short Term Goal 4 (Week 1): Patient will perform standing balance, postural control and pre-gait training bilat UE supported max A with focus on improved orientation to midline, weight shift and controlled advancement of LLE PT Short Term Goal 4 - Progress (Week 1): Met Week 2:  PT Short Term Goal 1 (Week 2): Patient will perform bed mobility to L and R on flat bed with consistent mod A  PT Short Term Goal 1 - Progress (Week 2): Met PT Short Term Goal 2 (Week 2): Patient will perform bed <> w/c transfers to L and R with consistent mod A and mod verbal cues for attention and placement of LLE/UE  PT Short Term Goal 2 - Progress (Week 2): Met PT Short Term Goal 3 (Week 2): Patient will perform w/c mobility in mildly distracting environment with improved attention to L side and obstacle negotiation with supervision and verbal cues for safety PT Short Term Goal 3 - Progress (Week 2): Progressing toward goal PT Short Term Goal 4 (Week 2): Patient will perform gait training in controlled environment x 50' with mod A with improved sustained activation and  coordination of LLE PT Short Term Goal 4 - Progress (Week 2): Progressing toward goal Week 3:  PT Short Term Goal 1 (Week 3): Patient will perform w/c mobility in mildly distracting environment with improved attention to L side and obstacle negotiation with supervision and verbal cues for safety PT Short Term Goal 1 - Progress (Week 3): Progressing toward goal PT Short Term Goal 2 (Week 3): Patient will perform gait training in controlled environment x 100' with mod A with improved sustained activation and coordination of LLE  PT Short Term Goal 2 - Progress (Week 3): Met PT Short Term Goal 3 (Week 3): Patient will perform bed mobility on flat bed/mat with supervision and bed <> w/c transfers to L and R consistent min A PT Short Term Goal 3 - Progress (Week 3): Met  Therapy Documentation Precautions:  Precautions Precautions: Fall Precaution Comments: Lt inattention, Lt alien arm, Lt field cut Restrictions Weight Bearing Restrictions: No  Pain: Pain Assessment Pain Assessment: No/denies pain Pain Score: 0-No pain  Locomotion : Participated in walking group with focus on car transfer training with UE support on RW with hand splint and min A with verbal cues for sequence and safety with pivoting with RW and cues to attend to LLE and LUE; stair training up and down 8 stairs with bilat rails with mod A and verbal cues for stepping sequence, and higher level gait training x 50' with  min A during direction changes, weaving L and R around obstacles, changes in gait speed, vertical and horizontal head turns, sudden stops, retro and lateral stepping with verbal cues to attend to L environment.  See FIM for current functional status  Therapy/Group: Group Therapy  Edman Circle [facility] 01/02/2012, 4:33 PM

## 2012-01-02 NOTE — Progress Notes (Signed)
Patient ID: Craig [name], male   DOB: 01/18/1951, 61 y.o.   MRN: [id number]  Subjective/Complaints: Slept poorly, took nap yest afternoon Review of Systems  HENT: Positive for hearing loss.     Objective: Vital Signs: Blood pressure 124/60, pulse 69, temperature 98.1 F (36.7 C), temperature source Oral, resp. rate 18, height 5\' 10"  (1.778 m), weight 88.2 kg (194 lb 7.1 oz), SpO2 100.00%. No results found. No results found for this or any previous visit (from the past 72 hour(s)).  Physical Exam  Nursing note and vitals reviewed.  Constitutional: He is oriented to person, place, and time. He appears well-developed.  HENT: Oral mucosa pink and moist.  Head: Normocephalic and atraumatic.  Eyes: Conjunctivae are normal. Pupils are equal, round, and reactive to light.  Neck: Normal range of motion. Neck supple. No thyromegaly present.  Cardiovascular: Normal rate. Regular rhythm without murmurs rubs or gallops.  Pulmonary/Chest: Effort normal and breath sounds normal. He has no wheezes, rales, rhonchi.  Abdominal: He exhibits no distension. There is no tenderness. Bowel sounds are positive  Musculoskeletal: He exhibits no edema.  Neurological: He is alert and oriented to person, place, and time.  Left visual field cut.Patient with mild apraxia and decreased fine motor skills. Left arm and leg are quite ataxic.  He is left hemisensory loss severe  He has diminished attention to the left side as well today. Cognitively he is quite appropriate and pleasant. Insight and awareness are fair. L homonymous hemianopsia.  Reduced finger to thumb opposition Skin: Skin is warm and dry.   Assessment/Plan: 1. 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 Physiatrist is providing close team supervision and 24 hour management of active medical problems listed below. Physiatrist  and rehab team continue to assess barriers to discharge/monitor patient progress toward functional and medical goals.  Team Confererence today D/C plans this week. FIM: FIM - Bathing Bathing Steps Patient Completed: Chest;Left Arm;Abdomen;Front perineal area;Buttocks;Left upper leg;Right upper leg;Right lower leg (including foot);Left lower leg (including foot) Bathing: 4: Min-Patient completes 8-9 56f 10 parts or 75+ percent  FIM - Upper Body Dressing/Undressing Upper body dressing/undressing steps patient completed: Thread/unthread left sleeve of pullover shirt/dress;Put head through opening of pull over shirt/dress;Thread/unthread right sleeve of pullover shirt/dresss;Pull shirt over trunk Upper body dressing/undressing: 3: Mod-Patient completed 50-74% of tasks FIM - Lower Body Dressing/Undressing Lower body dressing/undressing steps patient completed: Thread/unthread right underwear leg;Thread/unthread left underwear leg;Pull underwear up/down;Thread/unthread right pants leg;Thread/unthread left pants leg;Pull pants up/down;Don/Doff right sock;Don/Doff left sock;Don/Doff right shoe;Don/Doff left shoe Lower body dressing/undressing: 4: Min-Patient completed 75 plus % of tasks  FIM - Toileting Toileting steps completed by patient: Adjust clothing prior to toileting;Performs perineal hygiene;Adjust clothing after toileting Toileting Assistive Devices: Grab bar or rail for support Toileting: 2: Max-Patient completed 1 of 3 steps  FIM - Archivist Transfers: 3-From toilet/BSC: Mod A (lift or lower assist);3-To toilet/BSC: Mod A (lift or lower assist)  FIM - Bed/Chair Transfer Bed/Chair Transfer Assistive Devices: Therapist, occupational: 4: Bed > Chair or W/C: Min A (steadying Pt. > 75%);4: Chair or W/C > Bed: Min A (steadying Pt. > 75%)  FIM - Locomotion: Wheelchair Distance: 150 with bilat LE alternating propulsion with assistance for sequencing and coordination of L and R  LE and for safety and sequence with turning. Locomotion: Wheelchair: 1: Total Assistance/staff pushes wheelchair (Pt<25%) FIM - Locomotion: Ambulation Locomotion:  Ambulation Assistive Devices: Walker - Rolling;Orthosis Ambulation/Gait Assistance: 4: Min assist Locomotion: Ambulation: 1: Travels less than 50 ft with minimal assistance (Pt.>75%)  Comprehension Comprehension Mode: Auditory Comprehension: 5-Follows basic conversation/direction: With extra time/assistive device  Expression Expression Mode: Verbal Expression: 5-Expresses complex 90% of the time/cues < 10% of the time  Social Interaction Social Interaction: 5-Interacts appropriately 90% of the time - Needs monitoring or encouragement for participation or interaction.  Problem Solving Problem Solving: 3-Solves basic 50 - 74% of the time/requires cueing 25 - 49% of the time  Memory Memory Mode: Asleep Memory: 5-Recognizes or recalls 90% of the time/requires cueing < 10% of the time   Medical Problem List and Plan:  1. 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 DVT Prophylaxis/Anticoagulation: SCDs and ambulation.  3. Hypertension. Norvasc 10 mg daily, hydrochlorothiazide 25 mg daily, Avapro 300 mg daily, bystolic 10 mg daily. BP with fair control. HTN ed by staff. 4. Elevated hemoglobin A1c: Sugars at this point are essentially within normal limits. 5. Mood: no sign of post CVA depression  LOS (Days) 23 A FACE TO FACE EVALUATION WAS PERFORMED  Yarel Kilcrease E 01/02/2012, 8:08 AM

## 2012-01-02 NOTE — Care Management Note (Signed)
Met with pt and called pt's wife to report on team conference. Both in agreement with plan for discharge 01/04/12.

## 2012-01-02 NOTE — Patient Care Conference (Signed)
Inpatient RehabilitationTeam Conference Note Date: 01/02/2012   Time: 10:30  AM    Patient Name: Craig [name]      Medical Record Number: [id number]  Date of Birth: 1951-06-26 Sex: Male         Room/Bed: 4033/4033-01 Payor Info: Payor: BLUE CROSS BLUE [employer]IELD  Plan: [facility] PPO  Product Type: *No Product type*     Admitting Diagnosis: B CVA  Admit Date/Time:  12/10/2011  6:10 PM Admission Comments: No comment available   Primary Diagnosis:  Stroke Principal Problem: Stroke  Patient Active Problem List  Diagnoses Date Noted  . 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 Stroke 12/11/2011  . CVA (cerebral infarction) 12/06/2011  . Hypertension, accelerated 05/15/2011    Expected Discharge Date: Expected Discharge Date: 01/04/12  Team Members Present: Physician: Dr. Claudette Laws Case Manager Present: Lutricia Horsfall, RN Social Worker Present: Dossie Der, LCSW Nurse Present: Gregor Hams, RN PT Present: Edman Circle, PT OT Present: Bretta Bang, OT SLP Present: Fae Pippin, Liliane Shi, OT Tora Duck, RN, PPS Coordinator     Current Status/Progress Goal Weekly Team Focus  Medical   L field cut, urinary inc  Timed toileting  Assess home bladder plan   Bowel/Bladder   toilet every 3 hours and prn, assist with urinal,continent of bowel  Continent with toileting  UP to Bathroom   Swallow/Nutrition/ Hydration   heart healthy, meds whole in puree, encouraging fluids         ADL's   Min A squat pivot transfer to/from tub bench in ADL bathroom, Steady A bathing; Min-mod A LB/UB dressing  Supervision overall  Tub bench transfers; pt/family education B/D, transfers, Lt body/environment attention for dressing, sequencing & problem solving   Mobility   supervision bed mobility, bed <> chair transfers; min A ambulation with RW, stair negotiation with RW  supervision-min A overall   Family education, D/C planning, gait and balance     Communication   can make needs known  communicate needs to family and staff      Safety/Cognition/ Behavioral Observations  min-mod assist  min assist  family education   Pain   no complaint of pain  3 or less      Skin   intact, left elbow abrasion healed open to air, small scab  no breakdown         *See Interdisciplinary Assessment and Plan and progress notes for long and short-term goals  Barriers to Discharge: wife has not completed training, bladder program, sensory ataxia    Possible Resolutions to Barriers:  assess whether condom cath needed at home    Discharge Planning/Teaching Needs:  Wife here this week to learn pt's care in preparation for discharge home on Friday.      Team Discussion:  Pt's wife has been in for education with good return demonstration.  Working on car transfers and car transfer and stairs. Pt does not want to use condom cath at night.   Revisions to Treatment Plan:  none   Continued Need for Acute Rehabilitation Level of Care: The patient requires daily medical management by a physician with specialized training in physical medicine and rehabilitation for the following conditions: Daily direction of a multidisciplinary physical rehabilitation program to ensure safe treatment while eliciting the highest outcome that is of practical value to the patient.: Yes Daily medical management of patient stability for increased activity during participation in an intensive rehabilitation regime.: Yes Daily analysis of laboratory values and/or radiology  reports with any subsequent need for medication adjustment of medical intervention for : Neurological problems  Meryl Dare 01/04/2012, 8:48 AM

## 2012-01-02 NOTE — Progress Notes (Signed)
Social Work Patient ID: Craig [name], male   DOB: [birth year], 61 y.o.   MRN: [id number] Pt requires a lightweight wheelchair due to unable to self propel in a standard wheelchair.   He also utilizes the wheelchair to assist with his ADL's.    Wife to come in tomorrow to do an Actual car transfer with their car.  Continue to do family education with wife this week.

## 2012-01-02 NOTE — Progress Notes (Signed)
Speech Language Pathology Daily Session Note  Patient Details  Name: Craig [name] MRN: [id number] Date of Birth: Mar 05, 1951  Today's Date: 01/02/2012 Time: 1005-1030 Time Calculation (min): 25 min  Short Term Goals: Week 3: SLP Short Term Goal 1 (Week 3): Patient will attend to left of environment with modified independence SLP Short Term Goal 2 (Week 3): Patient will attend to left duirng reading and writing tasks with supervision sematnic cues SLP Short Term Goal 3 (Week 3): Patient will request help with minimal assist semantic cues  SLP Short Term Goal 4 (Week 3): Patient will utilize compensatory strategies for safe placement of left upper extremity to avoid encountering objects with minimal assist semantic cues   Skilled Therapeutic Interventions: Session focused on family education with wife present; SLP facilitated session by educating patient and wife on oral coordination problems with swallowing medication whole with thin liquids; it is not a dysphagia based problem but a cognition/oral coordination problem. Wife was able to return demonstration of how to assist patient with basic tasks and SLP educated her suggesting she give him increased wait time to attempt to solve basic problems on his own and wife verbalized understanding.  Daily Session Precautions/Restrictions    FIM:  Comprehension Comprehension Mode: Auditory Comprehension: 5-Follows basic conversation/direction: With extra time/assistive device Expression Expression Mode: Verbal Expression: 5-Expresses complex 90% of the time/cues < 10% of the time Social Interaction Social Interaction: 6-Interacts appropriately with others with medication or extra time (anti-anxiety, antidepressant). Problem Solving Problem Solving: 3-Solves basic 50 - 74% of the time/requires cueing 25 - 49% of the time Memory Memory: 5-Recognizes or recalls 90% of the time/requires cueing < 10% of the time FIM - Eating Eating Activity: 5:  Set-up assist for open containers;5: Set-up assist for cut food;5: Supervision/cues (Pt benefitted from plate guard today at lunch) General    Pain Pain Assessment Pain Assessment: No/denies pain Pain Score: 0-No pain  Therapy/Group: Individual Therapy  Charlane Ferretti., CCC-SLP [phone]  Craig [name] 01/02/2012, 1:27 PM

## 2012-01-02 NOTE — Progress Notes (Signed)
Occupational Therapy Session Note  Patient Details  Name: Craig [name] MRN: [id number] Date of Birth: [birth year]  Today's Date: 01/02/2012 Time: 1130-1200 Time Calculation (min): 30 min  Short Term Goals: Week 2:  OT Short Term Goal 1 (Week 2): Pt will complete UB dressing with min assist and min verbal cues for setup and sequencing OT Short Term Goal 1 - Progress (Week 2): Progressing toward goal OT Short Term Goal 2 (Week 2): Pt will complete squat pivot transfer with min assist and min verbal cues 2/3 sessions OT Short Term Goal 2 - Progress (Week 2): Progressing toward goal OT Short Term Goal 3 (Week 2): Pt will complete LB dressing with min assist with AE prn (elastic shoelaces) OT Short Term Goal 3 - Progress (Week 2): Progressing toward goal OT Short Term Goal 4 (Week 2): Pt will complete 2 of 3 toileting steps 2/3 sessions OT Short Term Goal 4 - Progress (Week 2): Progressing toward goal  Skilled Therapeutic Interventions/Progress Updates:    Pt seen in [name] today with focus on L UE functional use/awareness. He requires verbal cues to accomodate for visual field cut and decreased sensation and proprioception in LUE. He was overall set-up assist for cutting food, opening containers and he benefited from the use of a plate guard today. Discussed purchase of a plate guard with pt (his wife was not present, consider further discussion next treatment session that she is available).  Therapy Documentation Precautions:  Precautions Precautions: Fall Precaution Comments: Lt inattention, Lt alien arm, Lt field cut Restrictions Weight Bearing Restrictions: No   Pain:0/10 No pain Per pt report    See FIM for current functional status  Therapy/Group: Group Therapy  Charletta Cousin, Crystol Walpole Beth Dixon 01/02/2012, 12:52 PM

## 2012-01-03 DIAGNOSIS — Z5189 Encounter for other specified aftercare: Secondary | ICD-10-CM

## 2012-01-03 DIAGNOSIS — I69993 Ataxia following unspecified cerebrovascular disease: Secondary | ICD-10-CM

## 2012-01-03 DIAGNOSIS — I6330 Cerebral infarction due to thrombosis of unspecified cerebral artery: Secondary | ICD-10-CM

## 2012-01-03 NOTE — Progress Notes (Signed)
Patient ID: Craig [name], male   DOB: 10-07-1950, 61 y.o.   MRN: [id number]  Subjective/Complaints: Slept poorly, took nap yest afternoon Review of Systems  HENT: Positive for hearing loss.     Objective: Vital Signs: Blood pressure 136/82, pulse 67, temperature 98.2 F (36.8 C), temperature source Oral, resp. rate 18, height 5\' 10"  (1.778 m), weight 87.816 kg (193 lb 9.6 oz), SpO2 100.00%. No results found. No results found for this or any previous visit (from the past 72 hour(s)).  Physical Exam  Nursing note and vitals reviewed.  Constitutional: He is oriented to person, place, and time. He appears well-developed.  HENT: Oral mucosa pink and moist.  Head: Normocephalic and atraumatic.  Eyes: Conjunctivae are normal. Pupils are equal, round, and reactive to light.  Neck: Normal range of motion. Neck supple. No thyromegaly present.  Cardiovascular: Normal rate. Regular rhythm without murmurs rubs or gallops.  Pulmonary/Chest: Effort normal and breath sounds normal. He has no wheezes, rales, rhonchi.  Abdominal: He exhibits no distension. There is no tenderness. Bowel sounds are positive  Musculoskeletal: He exhibits no edema.  Neurological: He is alert and oriented to person, place, and time.  Left visual field cut.Patient with mild apraxia and decreased fine motor skills. Left arm and leg are quite ataxic.  He is left hemisensory loss severe  He has diminished attention to the left side Cognitively he is quite appropriate and pleasant. Insight and awareness are fair. L homonymous hemianopsia.  Reduced finger to thumb opposition Skin: Skin is warm and dry.   Assessment/Plan: 1. 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 Physiatrist is providing close team supervision and 24 hour management of active medical problems listed below. Physiatrist and rehab  team continue to assess barriers to discharge/monitor patient progress toward functional and medical goals.  Should be ready for D/C in am FIM: FIM - Bathing Bathing Steps Patient Completed: Chest;Left Arm;Abdomen;Front perineal area;Buttocks;Right upper leg;Left upper leg;Right lower leg (including foot);Left lower leg (including foot) Bathing: 4: Steadying assist  FIM - Upper Body Dressing/Undressing Upper body dressing/undressing steps patient completed: Thread/unthread right sleeve of pullover shirt/dresss;Thread/unthread left sleeve of pullover shirt/dress;Put head through opening of pull over shirt/dress;Pull shirt over trunk Upper body dressing/undressing: 3: Mod-Patient completed 50-74% of tasks FIM - Lower Body Dressing/Undressing Lower body dressing/undressing steps patient completed: Thread/unthread right underwear leg;Thread/unthread left pants leg;Thread/unthread left underwear leg;Pull underwear up/down;Thread/unthread right pants leg;Pull pants up/down;Don/Doff right sock;Don/Doff left sock;Don/Doff right shoe;Don/Doff left shoe Lower body dressing/undressing: 4: Min-Patient completed 75 plus % of tasks  FIM - Toileting Toileting steps completed by patient: Adjust clothing prior to toileting Toileting Assistive Devices: Grab bar or rail for support Toileting: 2: Max-Patient completed 1 of 3 steps  FIM - Archivist Transfers: 3-From toilet/BSC: Mod A (lift or lower assist);3-To toilet/BSC: Mod A (lift or lower assist)  FIM - Bed/Chair Transfer Bed/Chair Transfer Assistive Devices: Therapist, occupational: 4: Bed > Chair or W/C: Min A (steadying Pt. > 75%);4: Chair or W/C > Bed: Min A (steadying Pt. > 75%)  FIM - Locomotion: Wheelchair Distance: 150 with bilat LE alternating propulsion with assistance for sequencing and coordination of L and R LE and for safety and sequence with turning. Locomotion: Wheelchair: 1: Total Assistance/staff pushes wheelchair  (Pt<25%) FIM - Locomotion: Ambulation Locomotion: Ambulation Assistive Devices: Designer, industrial/product Ambulation/Gait Assistance: 4: Min assist Locomotion: Ambulation: 1: [employer]  less than 50 ft with minimal assistance (Pt.>75%)  Comprehension Comprehension Mode: Auditory Comprehension: 5-Follows basic conversation/direction: With extra time/assistive device  Expression Expression Mode: Verbal Expression: 5-Expresses complex 90% of the time/cues < 10% of the time  Social Interaction Social Interaction: 6-Interacts appropriately with others with medication or extra time (anti-anxiety, antidepressant).  Problem Solving Problem Solving: 3-Solves basic 50 - 74% of the time/requires cueing 25 - 49% of the time  Memory Memory Mode: Asleep Memory: 5-Recognizes or recalls 90% of the time/requires cueing < 10% of the time   Medical Problem List and Plan:  1. 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 DVT Prophylaxis/Anticoagulation: SCDs and ambulation.  3. Hypertension. Norvasc 10 mg daily, hydrochlorothiazide 25 mg daily, Avapro 300 mg daily, bystolic 10 mg daily. BP with fair control. HTN ed by staff. 4. Elevated hemoglobin A1c: Sugars at this point are essentially within normal limits. 5. Mood: no sign of post CVA depression  LOS (Days) 24 A FACE TO FACE EVALUATION WAS PERFORMED  Dasan Hardman E 01/03/2012, 7:18 AM

## 2012-01-03 NOTE — Progress Notes (Signed)
Speech Language Pathology Daily Session Note  Patient Details  Name: Craig [name] MRN: [id number] Date of Birth: March 13, 1951  Today's Date: 01/03/2012 Time: 1200-1220 Time Calculation (min): 20 min  Short Term Goals: Week 3: SLP Short Term Goal 1 (Week 3): Patient will attend to left of environment with modified independence SLP Short Term Goal 2 (Week 3): Patient will attend to left duirng reading and writing tasks with supervision sematnic cues SLP Short Term Goal 3 (Week 3): Patient will request help with minimal assist semantic cues  SLP Short Term Goal 4 (Week 3): Patient will utilize compensatory strategies for safe placement of left upper extremity to avoid encountering objects with minimal assist semantic cues   Skilled Therapeutic Interventions: Session focused on treatment of cognition with self feeding task; SLP facilitated session with minimal assist faded to supervision semantic cues to sequence and problem solve with use of adaptive equipment provided by OT.    Daily Session Precautions/Restrictions  Precautions Precautions: Fall FIM:  FIM - Eating Eating Activity: 5: Set-up assist for open containers;5: Supervision/cues;5: Set-up assist for cut food (Plate guard & rocker knife used today) Pain Pain Assessment Pain Assessment: No/denies pain Pain Score: 0-No pain  Therapy/Group: Group Therapy  Charlane Ferretti., CCC-SLP [phone]  Tanishi Nault 01/03/2012, 4:21 PM

## 2012-01-03 NOTE — Progress Notes (Signed)
Social Work Patient ID: Craig [name], male   DOB: 03/12/1951, 61 y.o.   MRN: [id number] Met with wife to confirm discharge plans for tomorrow.  She will be here at 9:30 to wrap up PT and OT education. Aware of OP rehab therapies appt and DME to be delivered today or tomorrow early am.  Feels ready for discharge Tomorrow, pt so glad to be going home.

## 2012-01-03 NOTE — Discharge Summary (Signed)
  Discharge summary job # [id number]

## 2012-01-03 NOTE — Discharge Summary (Signed)
Occupational Therapy Discharge Summary  Patient Details  Name: Craig [name] MRN: [id number] Date of Birth: 09/19/1950  Today's Date: 01/04/2012 Time: 0930-1000 Time Calculation (min): 30 min  Patient has met 8 of 11 long term goals due to improved activity tolerance, improved balance, postural control, ability to compensate for deficits, functional use of  LEFT upper and LEFT lower extremity, improved attention, improved awareness and improved coordination.  Patient to discharge at [facility] Assist level.  Patient's care partner is independent to provide the necessary physical and cognitive assistance at discharge.  Pt requires the occasional min/steady assist in standing to complete LB bathing and dressing secondary to balance deficits with dynamic standing balance.  Pt requires min assist with ambulation required for transfers due to bathroom being inaccessible via w/c.     Reasons goals not met: Pt requires occasional min/steady assist in standing to complete LB bathing and dressing secondary to balance deficits with dynamic standing balance.    Recommendation:  Patient will benefit from ongoing skilled OT services in outpatient setting to continue to advance functional skills in the area of BADL, iADL and Reduce care partner burden.  Equipment: 3 in 1, tub bench, RW, and w/c  Reasons for discharge: treatment goals met and discharge from hospital  Patient/family agrees with progress made and goals achieved: Yes  OT Discharge Precautions/Restrictions  Precautions Precautions: Fall General   Vital Signs   Pain Pain Assessment Pain Assessment: No/denies pain ADL ADL Equipment Provided:  (elastic shoelaces) Grooming: Supervision/safety Where Assessed-Grooming: Standing at sink Upper Body Bathing: Supervision/safety;Setup;Minimal cueing Where Assessed-Upper Body Bathing: Shower Lower Body Bathing: Setup;Minimal cueing;Minimal assistance Where Assessed-Lower Body Bathing:  Shower Upper Body Dressing: Supervision/safety;Setup;Moderate cueing Where Assessed-Upper Body Dressing: Sitting at sink Lower Body Dressing: Setup;Minimal cueing;Minimal assistance Where Assessed-Lower Body Dressing: Sitting at sink;Standing at sink Toileting: Minimal assistance Toilet Transfer: Minimal assistance Toilet Transfer Method: Stand pivot;Squat pivot Tub/Shower Transfer: Minimal assistance Tub/Shower Transfer Method: Stand pivot;Squat pivot Tub/Shower Equipment: Insurance underwriter: Minimal assistance Film/video editor Method: Administrator: Transfer tub bench;Grab bars ADL Comments: Pt benefits from use of mirror with bathing and dressing for visual cues to accomodate for visual field cut and decreased sensation and proprioception in LUE. Vision/Perception  Vision - History Baseline Vision: No visual deficits Patient Visual Report: Other (comment) (Lt visual field cut, pt with increased ability to compensate) Vision - Assessment Eye Alignment: Within Functional Limits Perception Perception: Impaired Inattention/Neglect: Does not attend to left side of body;Does not attend to left visual field (Continues to improve with cues to visually scan and attend) Praxis Praxis: Impaired Praxis Impairment Details: Motor planning  Cognition   Sensation Sensation Light Touch: Impaired Detail Light Touch Impaired Details: Absent LUE;Absent LLE Proprioception: Impaired Detail Proprioception Impaired Details: Absent LUE;Absent LLE Coordination Gross Motor Movements are Fluid and Coordinated: No Fine Motor Movements are Fluid and Coordinated: No Coordination and Movement Description: LUE and LLE ataxia persisting, improves with visual attention, impaired coordination Finger Nose Finger Test: improved, but still presents with dysmetria on Lt side Extremity/Trunk Assessment RUE Assessment RUE Assessment: Within Functional  Limits LUE Assessment LUE Assessment: Exceptions to [facility] (strength WFL, ROM difficult to assess with Alien arm persist)  See FIM for current functional status  [name] 01/04/2012, 11:54 AM

## 2012-01-03 NOTE — Progress Notes (Signed)
Occupational Therapy Session Note  Patient Details  Name: Craig [name] MRN: [id number] Date of Birth: 05/24/1951  Today's Date: 01/03/2012 Time: 1415-1500 Time Calculation (min): 45 min  Skilled Therapeutic Interventions/Progress Updates: Patient seen for 1:1 OT today to address neuromuscular reeducation to left upper extremity with emphasis on using  Varied stimulation to determine effectiveness of movement strategies.  For example, using vision to help hand move toward (vs. away from) target, using sound to grade pressure on object, using right upper extremity as a model for more "typical" patterns of movement.  Practiced reach, pinch, grasp, release, and beginning of in hand manipulation.  Forearm gym utilized for visual feedback of forearm, wrist, hand coordinated movement.       Therapy Documentation Precautions:  Precautions Precautions: Fall Precaution Comments: Lt inattention, Lt alien arm, Lt field cut  Restrictions Weight Bearing Restrictions: No   Pain: Pain Assessment Pain Assessment: No/denies pain  See FIM for current functional status  Therapy/Group: Individual Therapy  Collier Salina 01/03/2012, 4:11 PM

## 2012-01-03 NOTE — Discharge Summary (Signed)
Craig [name], Craig [name] NO.:  [id number]  MEDICAL RECORD NO.:  [id number]  LOCATION:  4033                         FACILITY:  [facility]  PHYSICIAN:  Craig [name]:  [birth year]  DATE OF ADMISSION:  12/10/2011 DATE OF DISCHARGE:  01/04/2012                              DISCHARGE SUMMARY   DISCHARGE DIAGNOSES: 1. 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 2. Sequential compression devices for deep venous thrombosis     prophylaxis. 3. Hypertension.  This is a 61 year old, right-handed, African American male with history of hypertension, admitted on December 06, 2011, with left-sided weakness and facial droop, as well as dizziness, with episode of nausea and vomiting. 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  MRA of the head with diffuse intracranial atherosclerotic disease as well as occlusion of right posterior cerebral artery and occlusion of left superior cerebellar artery compatible with areas of acute infarction. Echocardiogram showed ejection fraction of 65% and grade 1 diastolic dysfunction without emboli.  Carotid Dopplers with no ICA stenosis. Neurology was consulted, placed on aspirin plus Plavix therapy.  He will continue this regimen for 3 months, followed by aspirin alone.  Mildly elevated hemoglobin A1c of 6.4, blood sugars well controlled.  He was admitted for a comprehensive rehab program.  PAST MEDICAL HISTORY:  See discharge diagnoses.  SOCIAL HISTORY:  Lives with his spouse and son in one-level home.  FUNCTIONAL HISTORY:  Prior to admission was independent.  He works full time.  He drives.  Functional status upon admission to rehab services was minimal assist for roll in the bed, +2 total assist for sit to side lying to the right, +2 total assist sit to stand.  PHYSICAL  EXAMINATION:  VITAL SIGNS:  Blood pressure 124/60, pulse 62, respirations 18, temperature 97.5. GENERAL:  This is an alert male, oriented x3.  He had a left visual field cut, mild apraxia, and decreased fine motor skills.  Cognitively, he was appropriate. LUNGS:  Clear to auscultation. CARDIAC:  Regular rate and rhythm. ABDOMEN:  Soft, nontender.  Good bowel sounds.  REHABILITATION HOSPITAL COURSE:  The patient was admitted to inpatient rehab services with therapies initiated on a 3-hour daily basis, consisting of physical therapy, occupational therapy, and rehabilitation nursing.  The following issues were addressed during the patient's rehabilitation stay.  Pertaining to [name]'s right PCA infarction, as well as left superior cerebellar artery infarction, he remained stable, was maintained on aspirin and Plavix therapy.  He will continue this regimen for approximately 3 months, followed by aspirin alone per Neurology Services, Dr. Pearlean [name].  The patient wears sequential compression devices for DVT prophylaxis.  His blood pressures are well controlled on Norvasc 10 mg daily, as well as Avapro 150 mg daily, and Bystolic 10 mg daily.  There were no orthostatic changes.  He would follow up with his PCP, Dr. Susann Givens.  He had a mildly elevated hemoglobin A1c of 6.4. Blood sugars overall were well controlled with no oral diabetic agents. Again, he would follow up with necessary management with  his PCP.  The patient received weekly collaborative interdisciplinary team conferences to discuss estimated length of stay, family teaching, and any barriers to his discharge.  He was minimal assist for squat pivot transfers to from tub, bench, steady assist bathing, minimal to moderate assist lower body and upper body dressing, supervision bed mobility, as well as bed- to-chair transfers, minimal assist to ambulate with a rolling walker and navigating stairs.  Wife completed full family  teaching.  PLAN:  He will be discharged to home on Jan 04, 2012.  DISCHARGE MEDICATIONS: 1. Norvasc 10 mg p.o. daily. 2. Aspirin 81 mg p.o. daily. 3. Plavix 75 mg p.o. daily. 4. Avapro 150 mg p.o. daily. 5. Bystolic 10 mg p.o. daily.  His diet was regular.  The patient will follow up with Dr. Susann Givens on Jan 08, 2012, at 11:00 a.m., Dr. Claudette Laws on Jan 29, 2012, at 12:30 p.m. and Dr. Delia Heady, call for appointment in one month.  SPECIAL INSTRUCTIONS:  The patient should continue aspirin and Plavix therapy for a total of 3 months and then aspirin alone.  This could be addressed with Neurology Services.  Ongoing therapies were dictated as per [employer].  The patient was advised no driving.     Craig [name], P.A.   ______________________________ Craig [name], M.D.    DA/MEDQ  D:  01/03/2012  T:  01/03/2012  Job:  [id number]  cc:   Craig [name], M.D. Craig P. Pearlean Brownie, MD

## 2012-01-03 NOTE — Progress Notes (Signed)
Occupational Therapy Session Note  Patient Details  Name: Craig [name] MRN: [id number] Date of Birth: [birth year]  Today's Date: 01/03/2012 Time: 1130-1200 Time Calculation (min): 30 min  Skilled Therapeutic Interventions/Progress Updates:    Pt seen for OT in [employer] today with focus on self feeding and cutting food. Pt benefited from use of plate guard as well as rocker knife today and demonstrated increased independence in these areas with this a/e. He continues to need VC's & occasional assist however. Issued A/E catalogue and flagged pages with plate guard and rocker knife as pt's wife was not present during this session. This was reviewed with pt today.    Therapy Documentation Precautions:  Precautions Precautions: Fall Precaution Comments: Lt inattention, Lt alien arm, Lt field cut  Restrictions Weight Bearing Restrictions: No     Pain: Pain Assessment Pain Assessment: No/denies pain     See FIM for current functional status  Therapy/Group: Group Therapy  Charletta Cousin, Yonna Alwin Beth Dixon 01/03/2012, 1:09 PM

## 2012-01-03 NOTE — Plan of Care (Signed)
Problem: RH KNOWLEDGE DEFICIT Goal: RH STG INCREASE KNOWLEDGE OF HYPERTENSION Outcome: Progressing Discussed medications on for hypertension with patient and wife, regular follow up with MD

## 2012-01-03 NOTE — Discharge Instructions (Signed)
Inpatient Rehab Discharge Instructions  Kaegan Hettich Discharge date and time: No discharge date for patient encounter.   Activities/Precautions/ Functional Status: Activity: activity as tolerated Diet: No concentrated sweets Wound Care: none needed Functional status:  ___ No restrictions     ___ Walk up steps independently _x__ 24/7 supervision/assistance   _x__ Walk up steps with assistance ___ Intermittent supervision/assistance  ___ Bathe/dress independently ___ Walk with walker     ___ Bathe/dress with assistance ___ Walk Independently    ___ Shower independently __x_ Walk with assistance    _x STROKE/TIA DISCHARGE INSTRUCTIONS SMOKING Cigarette smoking nearly doubles your risk of having a stroke & is the single most alterable risk factor  If you smoke or have smoked in the last 12 months, you are advised to quit smoking for your health.  Most of the excess cardiovascular risk related to smoking disappears within a year of stopping.  Ask you doctor about anti-smoking medications  [place] Quit Line: 1-800-QUIT NOW  Free Smoking Cessation Classes ([phone]  CHOLESTEROL Know your levels; limit fat & cholesterol in your diet  Lab Results  Component Value Date   CHOL 139 12/07/2011   HDL 52 12/07/2011   LDLCALC 75 12/07/2011   TRIG 59 12/07/2011   CHOLHDL 2.7 12/07/2011      Many patients benefit from treatment even if their cholesterol is at goal.  Goal: Total Cholesterol less than 160  Goal:  LDL less than 100  Goal:  HDL greater than 40  Goal:  Triglycerides less than 150  BLOOD PRESSURE American Stroke Association blood pressure target is less that 120/80 mm/Hg  Your discharge blood pressure is:  BP: 124/60 mmHg  Monitor your blood pressure  Limit your salt and alcohol intake  Many individuals will require more than one medication for high blood pressure  DIABETES (A1c is a blood sugar average for last 3 months) Goal A1c is under 7% (A1c is blood sugar average for last 3  months)  Diabetes: No known diagnosis of diabetes    Lab Results  Component Value Date   HGBA1C 6.4* 12/07/2011    Your A1c can be lowered with medications, healthy diet, and exercise.  Check your blood sugar as directed by your physician  Call your physician if you experience unexplained or low blood sugars.  PHYSICAL ACTIVITY/REHABILITATION Goal is 30 minutes at least 4 days per week    Activity decreases your risk of heart attack and stroke and makes your heart stronger.  It helps control your weight and blood pressure; helps you relax and can improve your mood.  Participate in a regular exercise program.  Talk with your doctor about the best form of exercise for you (dancing, walking, swimming, cycling).  DIET/WEIGHT Goal is to maintain a healthy weight  Your height is:  Height: 5\' 10"  (177.8 cm) Your current weight is: Weight: 87.816 kg (193 lb 9.6 oz) Your body Mass Index (BMI) is:     Following the type of diet specifically designed for you will help prevent another stroke.  Your goal Body Mass Index (BMI) is 19-24.  Healthy food habits can help reduce 3 risk factors for stroke:  High cholesterol, hypertension, and excess weight.     __ Shower with assistance ___ No alcohol     ___ Return to work/school ________  Special Instructions:   COMMUNITY REFERRALS UPON DISCHARGE:    Outpatient: PT, OT ,SPT  Agency: [facility] Phone:[phone] Date of Last Service:01/04/2012   Appointment Date/Time:MAY  6 Monday 8;15-11:15  Medical Equipment/Items Ordered:WHEELCHAIR, BSC, TUB BENCH  Agency/Supplier:[facility]      [phone]  GENERAL COMMUNITY RESOURCES FOR PATIENT/FAMILY: Support Groups:[facility] SUPPORT GROUP    My questions have been answered and I understand these instructions. I will adhere to these goals and the provided educational materials after my discharge from the hospital.  Patient/Caregiver Signature _______________________________ Date  __________  Clinician Signature _______________________________________ Date __________  Please bring this form and your medication list with you to all your follow-up doctor's appointments.

## 2012-01-03 NOTE — Progress Notes (Signed)
Occupational Therapy Session Note  Patient Details  Name: Craig [name] MRN: [id number] Date of Birth: 11-01-1950  Today's Date: 01/03/2012 Time: 0820-0900 Time Calculation (min): 40 min  Short Term Goals: Week 3:  OT Short Term Goal 1 (Week 3): Pt will complete UB dressing with min assist and min verbal cues for setup and sequencing OT Short Term Goal 2 (Week 3): Pt will complete squat pivot transfer with min assist with min verbal cues 2/3 sessions OT Short Term Goal 3 (Week 3): Pt will complete LB dressing with min assist OT Short Term Goal 4 (Week 3): Pt will complete 2 of 3 toileting steps 2/3 sessions  Skilled Therapeutic Interventions/Progress Updates:    Pt seen for ADL retraining with focus on sit to stand, initiation and sequencing of bathing and dressing tasks, and decreased cues.  Pt continues to require setup and vc's for UB dressing secondary to perceptual deficits and continued decreased attention to Lt side.  Pt with increased problem solving abilities with dressing with question cues and cues to use vision to compensate.  Pt reports wanting an additional session with transfer training, specifically toilet and tub/shower, with wife prior to d/c.   Therapy Documentation Precautions:  Precautions Precautions: Fall Precaution Comments: Lt inattention, Lt alien arm, Lt field cut Restrictions Weight Bearing Restrictions: No Pain: Pain Assessment Pain Assessment: No/denies pain ADL: ADL Grooming: Setup;Minimal cueing Where Assessed-Grooming: Sitting at sink Upper Body Bathing: Setup;Minimal cueing Where Assessed-Upper Body Bathing: Shower Lower Body Bathing: Setup;Minimal cueing;Minimal assistance Where Assessed-Lower Body Bathing: Wheelchair Upper Body Dressing: Setup;Minimal assistance;Moderate cueing Where Assessed-Upper Body Dressing: Sitting at sink Lower Body Dressing: Setup;Moderate assistance;Moderate cueing Where Assessed-Lower Body Dressing: Sitting at  sink;Standing at sink Toilet Transfer: Moderate assistance Toilet Transfer Method: Squat pivot Tub/Shower Transfer: Not assessed Film/video editor: Minimal assistance Film/video editor Method: Administrator: Transfer tub bench;Grab bars ADL Comments: Pt benefits from use of mirror with bathing and dressing for visual cues to accomodate for visual field cut and decreased sensation and proprioception in LUE.  See FIM for current functional status  Therapy/Group: Individual Therapy  [name] 01/03/2012, 9:57 AM

## 2012-01-03 NOTE — Progress Notes (Signed)
Physical Therapy Discharge Summary  Patient Details  Name: Craig [name] MRN: [id number] Date of Birth: 1950/09/16  Today's Date: 01/03/2012 and 01/04/2012 Time: 0900-1005 and 1000-1038 Time Calculation (min): 65 min and 38 min  Patient has met 9 of 9 long term goals due to improved balance, improved postural control, increased strength, ability to compensate for deficits, functional use of  left upper extremity and left lower extremity, improved attention, improved awareness and improved coordination.  Patient to discharge at an ambulatory level Min Assist with RW and hand splint and w/c for longer distances in more challenging, distracting environments   Patient's care partner is independent to provide the necessary physical assistance at discharge.  Reasons goals not met: All goals met  Recommendation:  Patient will benefit from ongoing skilled PT services in outpatient setting to continue to advance safe functional mobility, address ongoing impairments in L sided strength, proprioception, motor control, timing, sequencing, coordination, attention to L during functional mobility, ataxia, dynamic balance, balance strategies, gait, and minimize fall risk.  Equipment: w/c, L hand splint  Reasons for discharge: treatment goals met and discharge from hospital  Patient/family agrees with progress made and goals achieved: Yes  PT Discharge Precautions/Restrictions Precautions Precautions: Fall Precaution Comments: Lt inattention, Lt alien arm, Lt field cut  Pain Pain Assessment Pain Assessment: No/denies pain Cognition  Still presents with impaired attention to L side, L environment, impaired awareness, problem solving and sequencing Sensation Sensation Light Touch: Impaired Detail Light Touch Impaired Details: Absent LLE;Absent LUE Proprioception: Impaired Detail Proprioception Impaired Details: Absent LLE;Absent LUE Coordination Gross Motor Movements are Fluid and Coordinated:  No Heel Shin Test: Improved but still presents with dysmetria  Motor  Motor Motor: Hemiplegia;Abnormal tone;Ataxia;Motor apraxia Motor - Discharge Observations: L sided weakness, increased tone, UE, LE ataxia, ataxic gait, apraxia improved but still present during more challenging postures and activities  Mobility Bed Mobility Supine to Sit: 5: Supervision Supine to Sit Details (indicate cue type and reason): All bed mobility and supine <> sit on flat bed, no rail with supervision Transfers Stand Pivot Transfers: 5: Supervision Stand Pivot Transfer Details (indicate cue type and reason): Supervision with RW with verbal and visual cues for sequencing and safety with RW during pivoting and removal of LUE from hand splint prior to sitting; reviewed real car transfer with wife present; reviewed safe w/c set up, sequence of stand pivot w/c <> car, w/c parts management for storage in car and for set up for transfer back to w/c.  Patient and wife gave repeat demonstration with wife's min A for safety and sequence when pivoting w/c <> car Locomotion  Ambulation Ambulation/Gait Assistance: 4: Min assist Ambulation Distance (Feet): 150 Feet Assistive device: Rolling walker;Other (Comment) (L hand splint) Ambulation/Gait Assistance Details: Patient's wife brought patient's RW from home; applied L hand splint for home use.  Patient performed gait x 150' with RW and hand splint and min A with visual cues as boundaries to maintain RW path and for L foot placement. Stairs / Additional Locomotion Curb: 4: 1 step training for home entry and exit with wife; reviewed and demonstrated sequence again; patient and wife gave repeat demonstration with min assist and multiple verbal cues for sequence and safety with RW, assist with placing RW on step or ground and stepping sequence.  Wife would like to practice one more time prior to D/C.  Until ramp completed, encouraged wife to have son present during step negotiation  for safety. Naval architect Assistance: 5: Financial planner  Details: Visual cues/gestures for precautions/safety;Visual cues/gestures for sequencing;Verbal cues for sequencing;Verbal cues for technique;Verbal cues for precautions/safety Wheelchair Propulsion: Both lower extermities Distance: 150 with bilat LE propulsion with verbal cues to attend to L environment and LLE   Trunk/Postural Assessment  Postural Control Postural Limitations: Patient now able to sit and stand in midline; still with slight L lean and LOB during prolonged standing  Balance Static Sitting Balance Static Sitting - Level of Assistance: 6: Modified independent (Device/Increase time) Dynamic Sitting Balance Dynamic Sitting - Level of Assistance: 5: Stand by assistance Static Standing Balance Static Standing - Balance Support: Bilateral upper extremity supported Static Standing - Level of Assistance: 4: Min assist Dynamic Standing Balance Dynamic Standing - Balance Support: Bilateral upper extremity supported Dynamic Standing - Level of Assistance: 4: Min assist Extremity Assessment   RLE Assessment RLE Assessment: Within Functional Limits LLE Assessment LLE Assessment: Exceptions to [facility] LLE Strength LLE Overall Strength: Deficits LLE Overall Strength Comments: 4/5 throughout; still with impaired motor control, timing, sequencing and coordination  AM session on 01/04/12: patient's son present for family education; patient received w/c for home and community mobility; adjusted leg rests to appropriate height and demonstrated to son how to perform w/c parts management and break down for storage in the car.  Also fit patient's RW from home with L hand splint.  Reviewed floor transfers with patient and family and signs and symptoms of fx or CVA and when to alert EMS; patient gave repeat demonstration of floor > furniture transfer with close S and max verbal and visual cues to attend to LUE  and LLE position and sequence.   Gait training: reviewed sequence for one step up and down for home entry and exit with RW; patient, wife and son gave repeat demonstration x 2 reps with wife giving cues for sequence, safe management of RW and min A for balance.  No further questions or concerns from family.    See FIM for current functional status  Edman Circle [facility] 01/03/2012, 11:23 AM

## 2012-01-03 NOTE — Progress Notes (Signed)
Speech Language Pathology Daily Session Note  Patient Details  Name: Craig [name] MRN: [id number] Date of Birth: 05-Nov-1950  Today's Date: 01/03/2012 Time: 1030-1100 Time Calculation (min): 30 min  Short Term Goals: Week 3: SLP Short Term Goal 1 (Week 3): Patient will attend to left of environment with modified independence SLP Short Term Goal 2 (Week 3): Patient will attend to left duirng reading and writing tasks with supervision sematnic cues SLP Short Term Goal 3 (Week 3): Patient will request help with minimal assist semantic cues  SLP Short Term Goal 4 (Week 3): Patient will utilize compensatory strategies for safe placement of left upper extremity to avoid encountering objects with minimal assist semantic cues   Skilled Therapeutic Interventions: Session focused on completing family education with SLP demonstrating how to maximize functional independence with cuing hierarchy during reading and writing tasks.  SLP provided wife with handouts.  Daily Session Precautions/Restrictions  Precautions Precautions: Fall FIM:  Comprehension Comprehension Mode: Auditory Comprehension: 5-Follows basic conversation/direction: With extra time/assistive device Expression Expression Mode: Verbal Expression: 5-Expresses complex 90% of the time/cues < 10% of the time Social Interaction Social Interaction: 6-Interacts appropriately with others with medication or extra time (anti-anxiety, antidepressant). Problem Solving Problem Solving: 4-Solves basic 75 - 89% of the time/requires cueing 10 - 24% of the time Memory Memory: 5-Recognizes or recalls 90% of the time/requires cueing < 10% of the time General    Pain Pain Assessment Pain Assessment: No/denies pain Pain Score: 0-No pain  Therapy/Group: Individual Therapy  Speech Language Pathology Discharge Summary  Patient Details  Name: Craig [name] MRN: [id number] Date of Birth: 16-Apr-1951  Today's Date: 01/03/2012 Time: 1030-1100 Time  Calculation (min): 30 min  Patient has met 4 of 4 long term goals due to gains in cognition.  Patient to discharge at [facility] Assist level.  Patient's care partner is independent to provide the necessary cognitive assistance at discharge.  Reasons goals not met: n/a  Recommendation:  Patient will benefit from ongoing skilled SLP services in outpatient setting to continue to advance functional skills in the area of maximize cognitive function and overall independence and reduce care partner burden.  Equipment: none  Reasons for discharge: treatment goals met and discharge from hospital  Patient/family agrees with progress made and goals achieved: Yes  See FIM for current functional status  Charlane Ferretti., CCC-SLP [phone]  Savana Spina 01/03/2012, 6:01 PM

## 2012-01-04 MED ORDER — ASPIRIN 81 MG PO CHEW: 81.0000 mg | CHEWABLE_TABLET | Freq: Every day | ORAL | Status: DC

## 2012-01-04 MED ORDER — IRBESARTAN 150 MG PO TABS: 150.0000 mg | ORAL_TABLET | Freq: Every day | ORAL | Status: DC

## 2012-01-04 MED ORDER — CLOPIDOGREL BISULFATE 75 MG PO TABS: 75.0000 mg | ORAL_TABLET | Freq: Every day | ORAL | Status: DC

## 2012-01-04 NOTE — Progress Notes (Signed)
Patient left at 11:18 in wheelchair. Pushed by staff member. Family at his side.order to discharge written. Dan in to give discharge instructions.

## 2012-01-04 NOTE — Progress Notes (Signed)
Occupational Therapy Session Note  Patient Details  Name: Craig [name] MRN: [id number] Date of Birth: 10/08/1950  Today's Date: 01/04/2012 Time: 0930-1000 Time Calculation (min): 30 min  Short Term Goals: Week 3:  OT Short Term Goal 1 (Week 3): Pt will complete UB dressing with min assist and min verbal cues for setup and sequencing OT Short Term Goal 2 (Week 3): Pt will complete squat pivot transfer with min assist with min verbal cues 2/3 sessions OT Short Term Goal 3 (Week 3): Pt will complete LB dressing with min assist OT Short Term Goal 4 (Week 3): Pt will complete 2 of 3 toileting steps 2/3 sessions  Skilled Therapeutic Interventions/Progress Updates:    Pt seen for hands on family education with wife Craig [name] and son Craig [name] with focus on tub/shower transfers and toilet transfers.  Pt required close supervision with stand pivot transfer to tub bench and BSC over toilet with use of RW.  Pt's wife demonstrated competence with providing cues for LUE placement and remaining inside walker with ambulation and safety cues with transferring out of tub/shower.  Pt's son able to provide cues for pt with occasional cues from mother to remind pt of Lt hand placement prior to sit <> stand.  Pt overall close supervision with transfers and min assist with ambulation requiring min question cues.  Therapy Documentation Precautions:  Precautions Precautions: Fall Precaution Comments: Lt inattention, Lt alien arm, Lt field cut  Restrictions Weight Bearing Restrictions: No Pain: Pain Assessment Pain Score: 0-No pain ADL: ADL Equipment Provided:  (elastic shoelaces) Grooming: Supervision/safety Where Assessed-Grooming: Standing at sink Upper Body Bathing: Supervision/safety;Setup;Minimal cueing Where Assessed-Upper Body Bathing: Shower Lower Body Bathing: Setup;Minimal cueing;Minimal assistance Where Assessed-Lower Body Bathing: Shower Upper Body Dressing: Supervision/safety;Setup;Moderate  cueing Where Assessed-Upper Body Dressing: Sitting at sink Lower Body Dressing: Setup;Minimal cueing;Minimal assistance Where Assessed-Lower Body Dressing: Sitting at sink;Standing at sink Toileting: Minimal assistance Toilet Transfer: Minimal assistance Toilet Transfer Method: Stand pivot;Squat pivot Tub/Shower Transfer: Minimal assistance Tub/Shower Transfer Method: Stand pivot;Squat pivot Tub/Shower Equipment: Insurance underwriter: Minimal assistance Film/video editor Method: Administrator: Transfer tub bench;Grab bars ADL Comments: Pt benefits from use of mirror with bathing and dressing for visual cues to accomodate for visual field cut and decreased sensation and proprioception in LUE.  See FIM for current functional status  Therapy/Group: Individual Therapy  [name] 01/04/2012, 11:44 AM

## 2012-01-04 NOTE — Progress Notes (Signed)
Patient ID: Craig [name], male   DOB: 01-Oct-1950, 61 y.o.   MRN: [id number]  Subjective/Complaints: Excited about D/C Review of Systems  HENT: Positive for hearing loss.     Objective: Vital Signs: Blood pressure 165/85, pulse 76, temperature 98.7 F (37.1 C), temperature source Oral, resp. rate 18, height 5\' 10"  (1.778 m), weight 87.816 kg (193 lb 9.6 oz), SpO2 100.00%. No results found. No results found for this or any previous visit (from the past 72 hour(s)).  Physical Exam  Nursing note and vitals reviewed.  Constitutional: He is oriented to person, place, and time. He appears well-developed.  HENT: Oral mucosa pink and moist.  Head: Normocephalic and atraumatic.  Eyes: Conjunctivae are normal. Pupils are equal, round, and reactive to light.  Neck: Normal range of motion. Neck supple. No thyromegaly present.  Cardiovascular: Normal rate. Regular rhythm without murmurs rubs or gallops.  Pulmonary/Chest: Effort normal and breath sounds normal. He has no wheezes, rales, rhonchi.  Abdominal: He exhibits no distension. There is no tenderness. Bowel sounds are positive  Musculoskeletal: He exhibits no edema.  Neurological: He is alert and oriented to person, place, and time.  Left visual field cut.Patient with mild apraxia and decreased fine motor skills. Left arm and leg are quite ataxic.  He is left hemisensory loss severe  He has diminished attention to the left side Cognitively he is quite appropriate and pleasant. Insight and awareness are fair. L homonymous hemianopsia.  Reduced finger to thumb opposition Skin: Skin is warm and dry.   Assessment/Plan: 1. 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 stable for D/CFIM: FIM - Bathing Bathing Steps Patient Completed: Chest;Right Arm;Left Arm;Abdomen;Front perineal area;Buttocks;Right upper leg;Left upper leg;Right lower leg (including foot);Left lower leg (including foot) Bathing: 4: Steadying assist  FIM  - Upper Body Dressing/Undressing Upper body dressing/undressing steps patient completed: Thread/unthread right sleeve of pullover shirt/dresss;Thread/unthread left sleeve of pullover shirt/dress;Put head through opening of pull over shirt/dress;Pull shirt over trunk Upper body dressing/undressing: 5: Supervision: Safety issues/verbal cues FIM - Lower Body Dressing/Undressing Lower body dressing/undressing steps patient completed: Thread/unthread right underwear leg;Thread/unthread left underwear leg;Pull underwear up/down;Thread/unthread right pants leg;Thread/unthread left pants leg;Pull pants up/down;Don/Doff right sock;Don/Doff left sock;Don/Doff right shoe;Don/Doff left shoe Lower body dressing/undressing: 4: Steadying Assist  FIM - Toileting Toileting steps completed by patient: Adjust clothing prior to toileting Toileting Assistive Devices: Grab bar or rail for support Toileting: 2: Max-Patient completed 1 of 3 steps  FIM - Archivist Transfers: 3-From toilet/BSC: Mod A (lift or lower assist);3-To toilet/BSC: Mod A (lift or lower assist)  FIM - Banker Devices: Therapist, occupational: 5: Supine > Sit: Supervision (verbal cues/safety issues);5: Sit > Supine: Supervision (verbal cues/safety issues);5: Bed > Chair or W/C: Supervision (verbal cues/safety issues);5: Chair or W/C > Bed: Supervision (verbal cues/safety issues)  FIM - Locomotion: Wheelchair Distance: 150 with bilat LE propulsion with verbal cues to attend to L environment and LLE  Locomotion: Wheelchair: 5: Travels 150 ft or more: maneuvers on rugs and over door sills with supervision, cueing or coaxing FIM - Locomotion: Ambulation Locomotion: Ambulation Assistive Devices: Designer, industrial/product Ambulation/Gait Assistance: 4: Min assist Locomotion: Ambulation: 4: Travels 150 ft or more with minimal assistance (Pt.>75%)  Comprehension Comprehension Mode:  Auditory Comprehension: 5-Follows basic conversation/direction: With extra time/assistive device  Expression Expression Mode: Verbal Expression: 5-Expresses complex 90% of the time/cues < 10% of the time  Social Interaction Social Interaction: 6-Interacts appropriately with others with  medication or extra time (anti-anxiety, antidepressant).  Problem Solving Problem Solving: 4-Solves basic 75 - 89% of the time/requires cueing 10 - 24% of the time  Memory Memory Mode: Asleep Memory: 5-Recognizes or recalls 90% of the time/requires cueing < 10% of the time   Medical Problem List and Plan:  1. 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 DVT Prophylaxis/Anticoagulation: SCDs and ambulation.  3. Hypertension. Norvasc 10 mg daily, hydrochlorothiazide 25 mg daily, Avapro 300 mg daily, bystolic 10 mg daily. BP with fair control. HTN ed by staff. 4. Elevated hemoglobin A1c: Sugars at this point are essentially within normal limits. 5. Mood: no sign of post CVA depression  LOS (Days) 25 A FACE TO FACE EVALUATION WAS PERFORMED  Tyreona Panjwani E 01/04/2012, 7:17 AM

## 2012-01-07 ENCOUNTER — Ambulatory Visit: Payer: BC Managed Care – PPO | Admitting: Occupational Therapy

## 2012-01-07 ENCOUNTER — Ambulatory Visit: Payer: BC Managed Care – PPO

## 2012-01-07 ENCOUNTER — Ambulatory Visit: Payer: BC Managed Care – PPO | Attending: Neurology | Admitting: Physical Therapy

## 2012-01-07 DIAGNOSIS — I69998 Other sequelae following unspecified cerebrovascular disease: Secondary | ICD-10-CM | POA: Insufficient documentation

## 2012-01-07 DIAGNOSIS — R4789 Other speech disturbances: Secondary | ICD-10-CM | POA: Insufficient documentation

## 2012-01-07 DIAGNOSIS — Z5189 Encounter for other specified aftercare: Secondary | ICD-10-CM | POA: Insufficient documentation

## 2012-01-07 DIAGNOSIS — R279 Unspecified lack of coordination: Secondary | ICD-10-CM | POA: Insufficient documentation

## 2012-01-07 DIAGNOSIS — M629 Disorder of muscle, unspecified: Secondary | ICD-10-CM | POA: Insufficient documentation

## 2012-01-07 DIAGNOSIS — R269 Unspecified abnormalities of gait and mobility: Secondary | ICD-10-CM | POA: Insufficient documentation

## 2012-01-07 DIAGNOSIS — I69919 Unspecified symptoms and signs involving cognitive functions following unspecified cerebrovascular disease: Secondary | ICD-10-CM | POA: Insufficient documentation

## 2012-01-07 DIAGNOSIS — M2420 Disorder of ligament, unspecified site: Secondary | ICD-10-CM | POA: Insufficient documentation

## 2012-01-08 ENCOUNTER — Encounter: Payer: Self-pay | Admitting: Family Medicine

## 2012-01-08 ENCOUNTER — Ambulatory Visit (INDEPENDENT_AMBULATORY_CARE_PROVIDER_SITE_OTHER): Payer: BC Managed Care – PPO | Admitting: Family Medicine

## 2012-01-08 VITALS — BP 110/60 | HR 64

## 2012-01-08 DIAGNOSIS — I69993 Ataxia following unspecified cerebrovascular disease: Secondary | ICD-10-CM

## 2012-01-08 DIAGNOSIS — R7309 Other abnormal glucose: Secondary | ICD-10-CM

## 2012-01-08 DIAGNOSIS — I10 Essential (primary) hypertension: Secondary | ICD-10-CM

## 2012-01-08 DIAGNOSIS — I639 Cerebral infarction, unspecified: Secondary | ICD-10-CM

## 2012-01-08 DIAGNOSIS — R7302 Impaired glucose tolerance (oral): Secondary | ICD-10-CM

## 2012-01-08 DIAGNOSIS — I6350 Cerebral infarction due to unspecified occlusion or stenosis of unspecified cerebral artery: Secondary | ICD-10-CM

## 2012-01-08 NOTE — Progress Notes (Signed)
  Subjective:    Patient ID: Craig [name], male    DOB: [birth year], 61 y.o.   MRN: [id number]  HPI He is here for a followup visit. He was admitted to the hospital April 8 for evaluation and treatment of CVA. He was then transferred to rehabilitation and has been in hospital rehabilitation unit from April 8 to May 2. He does have residual left arm and leg weakness as well as some ataxia. He is now at home. He is in the process of getting PT, OT as well as other ancillary personnel involved in his care. He is not driving. His wife will be doing the paperwork for short and long-term disability.   Review of Systems     Objective:   Physical Exam Alert and in no distress. His speech is normal. Residual left arm and leg weakness is noted. He is sitting in a wheelchair.       Assessment & Plan:   1. CVA (cerebral infarction)   2. Ataxia, late effect of cerebrovascular disease   3. Hypertension   4. Glucose intolerance (impaired glucose tolerance)    over 45 minutes spent discussing his CVA, ataxia, elevated hemoglobin A1c. We discussed the psychological implications of all this as well. He will continue to be followed by neurology especially in regard to functional capacity and ability to become more functional. I will follow the blood sugar with him. Explained that he would eventually become diabetic but at this time no further intervention is necessary. He'll continue on his present medications. Recheck here in 3 or 4 months.

## 2012-01-09 ENCOUNTER — Ambulatory Visit: Payer: BC Managed Care – PPO

## 2012-01-09 ENCOUNTER — Encounter: Payer: BC Managed Care – PPO | Admitting: Occupational Therapy

## 2012-01-11 ENCOUNTER — Ambulatory Visit: Payer: BC Managed Care – PPO | Admitting: Physical Therapy

## 2012-01-11 ENCOUNTER — Ambulatory Visit: Payer: BC Managed Care – PPO | Admitting: Occupational Therapy

## 2012-01-14 ENCOUNTER — Ambulatory Visit: Payer: BC Managed Care – PPO | Admitting: Physical Therapy

## 2012-01-14 ENCOUNTER — Encounter: Payer: BC Managed Care – PPO | Admitting: Occupational Therapy

## 2012-01-14 ENCOUNTER — Ambulatory Visit: Payer: BC Managed Care – PPO

## 2012-01-16 ENCOUNTER — Ambulatory Visit: Payer: BC Managed Care – PPO | Admitting: Speech Pathology

## 2012-01-16 ENCOUNTER — Ambulatory Visit: Payer: BC Managed Care – PPO | Admitting: Physical Therapy

## 2012-01-16 ENCOUNTER — Ambulatory Visit: Payer: BC Managed Care – PPO | Admitting: Occupational Therapy

## 2012-01-22 ENCOUNTER — Ambulatory Visit: Payer: BC Managed Care – PPO | Admitting: Physical Therapy

## 2012-01-22 ENCOUNTER — Ambulatory Visit: Payer: BC Managed Care – PPO | Admitting: Occupational Therapy

## 2012-01-22 ENCOUNTER — Ambulatory Visit: Payer: BC Managed Care – PPO

## 2012-01-29 ENCOUNTER — Encounter: Payer: Self-pay | Admitting: Physical Medicine & Rehabilitation

## 2012-01-29 ENCOUNTER — Ambulatory Visit (HOSPITAL_BASED_OUTPATIENT_CLINIC_OR_DEPARTMENT_OTHER): Payer: BC Managed Care – PPO | Admitting: Physical Medicine & Rehabilitation

## 2012-01-29 ENCOUNTER — Encounter: Payer: BC Managed Care – PPO | Attending: Physical Medicine & Rehabilitation

## 2012-01-29 VITALS — BP 125/73 | HR 64 | Resp 16 | Ht 70.0 in | Wt 193.0 lb

## 2012-01-29 DIAGNOSIS — I69998 Other sequelae following unspecified cerebrovascular disease: Secondary | ICD-10-CM | POA: Insufficient documentation

## 2012-01-29 DIAGNOSIS — H5340 Unspecified visual field defects: Secondary | ICD-10-CM | POA: Insufficient documentation

## 2012-01-29 DIAGNOSIS — H53462 Homonymous bilateral field defects, left side: Secondary | ICD-10-CM | POA: Insufficient documentation

## 2012-01-29 DIAGNOSIS — H53469 Homonymous bilateral field defects, unspecified side: Secondary | ICD-10-CM

## 2012-01-29 DIAGNOSIS — H539 Unspecified visual disturbance: Secondary | ICD-10-CM | POA: Insufficient documentation

## 2012-01-29 DIAGNOSIS — I69993 Ataxia following unspecified cerebrovascular disease: Secondary | ICD-10-CM | POA: Insufficient documentation

## 2012-01-29 DIAGNOSIS — R209 Unspecified disturbances of skin sensation: Secondary | ICD-10-CM

## 2012-01-29 NOTE — Patient Instructions (Signed)
You'll see me in one month Continue outpatient therapy

## 2012-01-29 NOTE — Progress Notes (Signed)
Subjective:    Patient ID: Craig [name], male    DOB: 10-18-1950, 61 y.o.   MRN: [id number]  HPI  This is a 61 year old, right-handed, African American male with history  of hypertension, admitted on December 06, 2011, with left-sided weakness and  facial droop, as well as dizziness, with episode of nausea and vomiting.  MRI of the brain showed acute right posterior cerebral artery infarction  as well as acute infarct left superior cerebellar territory. MRA of the  head with diffuse intracranial atherosclerotic disease as well as  occlusion of right posterior cerebral artery and occlusion of left  superior cerebellar artery compatible with areas of acute infarction.  Echocardiogram showed ejection fraction of 65% and grade 1 diastolic  dysfunction without emboli. Carotid Dopplers with no ICA stenosis.  Neurology was consulted, placed on aspirin plus Plavix therapy. He will  continue this regimen for 3 months, followed by aspirin alone  Patient has been going to outpatient rehabilitation and receives PT, OT and speech therapy. Working on scanning to the left side the speech therapist as well as map reading. In PT he is now walking with a quad cane rather than with a walker Pain Inventory Average Pain 0 Pain Right Now 0 My pain is N/A  In the last 24 hours, has pain interfered with the following? General activity 0 Relation with others 0 Enjoyment of life 0 What TIME of day is your pain at its worst? N/A Sleep (in general) Good  Pain is worse with: N/A Pain improves with: N/A Relief from Meds: N/A  Mobility walk with assistance use a walker use a wheelchair  Function employed # of hrs/week   Neuro/Psych tingling  Prior Studies Any changes since last visit?  no  Physicians involved in your care Any changes since last visit?  no   Family History  Problem Relation Age of Onset  . Cancer Mother   . Arthritis Mother   . Heart disease Mother   . Cancer Father   .  Cancer Sister    History   Social History  . Marital Status: Married    Spouse Name: N/A    Number of Children: N/A  . Years of Education: N/A   Social History Main Topics  . Smoking status: Never Smoker   . Smokeless tobacco: Never Used  . Alcohol Use: No  . Drug Use: No  . Sexually Active: Yes   Other Topics Concern  . None   Social History Narrative  . None   History reviewed. No pertinent past surgical history. Past Medical History  Diagnosis Date  . Hypertension   . Allergy     RHINITIS  . Diverticulosis   . Colonic polyp   . Stroke    BP 125/73  Pulse 64  Resp 16  Ht 5\' 10"  (1.778 m)  Wt 193 lb (87.544 kg)  BMI 27.69 kg/m2  SpO2 100%      Review of Systems  Constitutional: Negative.   HENT: Negative.   Eyes: Negative.   Respiratory: Negative.   Cardiovascular: Negative.   Gastrointestinal: Negative.   Genitourinary: Negative.   Musculoskeletal: Negative.   Skin: Negative.   Neurological: Negative.   Hematological: Negative.   Psychiatric/Behavioral: Positive for confusion.       Objective:   Physical Exam  Constitutional: He is oriented to person, place, and time. He appears well-developed.  Neurological: He is alert and oriented to person, place, and time. He has normal strength. A cranial nerve deficit  and sensory deficit is present. Coordination and gait abnormal.       A left visual field cut Absent sensation over the left hemibody including left cranial nerve 5 Sensory ataxia noted           Assessment & Plan:  1. Right PCA stroke causing severe sensory impairment over the left hemibodyas well as left visual field cut 2. Leftcerebellar stroke which is likely contributing to his balance disorder Recommend continuing outpatient PT OT and speech therapy. I will see him back in one month No driving No return to work

## 2012-01-30 ENCOUNTER — Ambulatory Visit: Payer: BC Managed Care – PPO | Admitting: Occupational Therapy

## 2012-01-30 ENCOUNTER — Ambulatory Visit: Payer: BC Managed Care – PPO | Admitting: Physical Therapy

## 2012-01-30 ENCOUNTER — Ambulatory Visit: Payer: BC Managed Care – PPO

## 2012-01-31 ENCOUNTER — Ambulatory Visit: Payer: BC Managed Care – PPO

## 2012-01-31 ENCOUNTER — Ambulatory Visit: Payer: BC Managed Care – PPO | Admitting: Occupational Therapy

## 2012-02-05 ENCOUNTER — Ambulatory Visit: Payer: BC Managed Care – PPO | Attending: Neurology | Admitting: Physical Therapy

## 2012-02-05 ENCOUNTER — Ambulatory Visit: Payer: BC Managed Care – PPO | Admitting: Occupational Therapy

## 2012-02-05 ENCOUNTER — Ambulatory Visit: Payer: BC Managed Care – PPO

## 2012-02-05 DIAGNOSIS — R269 Unspecified abnormalities of gait and mobility: Secondary | ICD-10-CM | POA: Insufficient documentation

## 2012-02-05 DIAGNOSIS — R4789 Other speech disturbances: Secondary | ICD-10-CM | POA: Insufficient documentation

## 2012-02-05 DIAGNOSIS — I69919 Unspecified symptoms and signs involving cognitive functions following unspecified cerebrovascular disease: Secondary | ICD-10-CM | POA: Insufficient documentation

## 2012-02-05 DIAGNOSIS — Z5189 Encounter for other specified aftercare: Secondary | ICD-10-CM | POA: Insufficient documentation

## 2012-02-05 DIAGNOSIS — R279 Unspecified lack of coordination: Secondary | ICD-10-CM | POA: Insufficient documentation

## 2012-02-05 DIAGNOSIS — M2420 Disorder of ligament, unspecified site: Secondary | ICD-10-CM | POA: Insufficient documentation

## 2012-02-05 DIAGNOSIS — I69998 Other sequelae following unspecified cerebrovascular disease: Secondary | ICD-10-CM | POA: Insufficient documentation

## 2012-02-05 DIAGNOSIS — M629 Disorder of muscle, unspecified: Secondary | ICD-10-CM | POA: Insufficient documentation

## 2012-02-07 ENCOUNTER — Ambulatory Visit: Payer: BC Managed Care – PPO | Admitting: Physical Therapy

## 2012-02-07 ENCOUNTER — Ambulatory Visit: Payer: BC Managed Care – PPO | Admitting: Occupational Therapy

## 2012-02-07 ENCOUNTER — Ambulatory Visit: Payer: BC Managed Care – PPO

## 2012-02-11 ENCOUNTER — Ambulatory Visit: Payer: BC Managed Care – PPO | Admitting: Occupational Therapy

## 2012-02-11 ENCOUNTER — Ambulatory Visit: Payer: BC Managed Care – PPO | Admitting: Physical Therapy

## 2012-02-13 ENCOUNTER — Ambulatory Visit: Payer: BC Managed Care – PPO

## 2012-02-14 ENCOUNTER — Ambulatory Visit: Payer: BC Managed Care – PPO

## 2012-02-14 ENCOUNTER — Ambulatory Visit: Payer: BC Managed Care – PPO | Admitting: Physical Therapy

## 2012-02-14 ENCOUNTER — Ambulatory Visit: Payer: BC Managed Care – PPO | Admitting: Occupational Therapy

## 2012-02-18 ENCOUNTER — Ambulatory Visit: Payer: BC Managed Care – PPO | Admitting: Physical Therapy

## 2012-02-18 ENCOUNTER — Encounter: Payer: BC Managed Care – PPO | Admitting: Occupational Therapy

## 2012-02-18 ENCOUNTER — Ambulatory Visit: Payer: BC Managed Care – PPO

## 2012-02-18 ENCOUNTER — Ambulatory Visit: Payer: BC Managed Care – PPO | Admitting: Occupational Therapy

## 2012-02-18 ENCOUNTER — Telehealth: Payer: Self-pay | Admitting: Family Medicine

## 2012-02-18 NOTE — Telephone Encounter (Signed)
LM

## 2012-02-19 ENCOUNTER — Emergency Department (HOSPITAL_COMMUNITY): Payer: BC Managed Care – PPO

## 2012-02-19 ENCOUNTER — Emergency Department (HOSPITAL_COMMUNITY)
Admission: EM | Admit: 2012-02-19 | Discharge: 2012-02-19 | Disposition: A | Discharge status: Home / Self Care | Payer: BC Managed Care – PPO | Attending: Emergency Medicine | Admitting: Emergency Medicine

## 2012-02-19 ENCOUNTER — Encounter (HOSPITAL_COMMUNITY): Payer: Self-pay | Admitting: *Deleted

## 2012-02-19 DIAGNOSIS — R569 Unspecified convulsions: Secondary | ICD-10-CM | POA: Insufficient documentation

## 2012-02-19 DIAGNOSIS — Z7982 Long term (current) use of aspirin: Secondary | ICD-10-CM | POA: Insufficient documentation

## 2012-02-19 DIAGNOSIS — Z8673 Personal history of transient ischemic attack (TIA), and cerebral infarction without residual deficits: Secondary | ICD-10-CM | POA: Insufficient documentation

## 2012-02-19 DIAGNOSIS — I10 Essential (primary) hypertension: Secondary | ICD-10-CM | POA: Insufficient documentation

## 2012-02-19 DIAGNOSIS — R55 Syncope and collapse: Secondary | ICD-10-CM | POA: Insufficient documentation

## 2012-02-19 DIAGNOSIS — Z79899 Other long term (current) drug therapy: Secondary | ICD-10-CM | POA: Insufficient documentation

## 2012-02-19 LAB — DIFFERENTIAL
Lymphocytes Relative: 6 % — ABNORMAL LOW (ref 12–46)
Lymphs Abs: 1 10*3/uL (ref 0.7–4.0)
Monocytes Absolute: 1 10*3/uL (ref 0.1–1.0)
Monocytes Relative: 6 % (ref 3–12)
Neutro Abs: 13.6 10*3/uL — ABNORMAL HIGH (ref 1.7–7.7)

## 2012-02-19 LAB — COMPREHENSIVE METABOLIC PANEL
ALT: 16 U/L (ref 0–53)
Calcium: 9.7 mg/dL (ref 8.4–10.5)
Creatinine, Ser: 1.51 mg/dL — ABNORMAL HIGH (ref 0.50–1.35)
GFR calc Af Amer: 57 mL/min — ABNORMAL LOW (ref >90)
Glucose, Bld: 143 mg/dL — ABNORMAL HIGH (ref 70–99)
Sodium: 140 mEq/L (ref 135–145)
Total Protein: 7.6 g/dL (ref 6.0–8.3)

## 2012-02-19 LAB — CBC
HCT: 43 % (ref 39.0–52.0)
Hemoglobin: 14.4 g/dL (ref 13.0–17.0)
MCHC: 33.5 g/dL (ref 30.0–36.0)
RBC: 5.15 MIL/uL (ref 4.22–5.81)
WBC: 15.8 10*3/uL — ABNORMAL HIGH (ref 4.0–10.5)

## 2012-02-19 MED ORDER — LEVETIRACETAM 500 MG PO TABS: 500.0000 mg | ORAL_TABLET | Freq: Two times a day (BID) | ORAL | Status: DC

## 2012-02-19 MED ORDER — SODIUM CHLORIDE 0.9 % IV SOLN
1000.0000 mg | Freq: Once | INTRAVENOUS | Status: AC
  Administered 2012-02-19: 1000 mg via INTRAVENOUS
  Filled 2012-02-19: qty 10

## 2012-02-19 NOTE — ED Provider Notes (Signed)
History     CSN: [id number]  Arrival date & time 02/19/12  1237   First MD Initiated Contact with Patient 02/19/12 1402      Chief Complaint  Patient presents with  . Seizures    (Consider location/radiation/quality/duration/timing/severity/associated sxs/prior treatment) HPI Pt had a witnessed tonic-clonic seizure today lasting roughly 10-15 min. Pt was incontinent and abraided tongue. Was drowsy after but has now returned to his baseline mental status. Pt had R-side PCA CVA leaving home with L sided deficits. No change in deficit since seizure. New onset seizure. Pt denies HA, neck pain, fever chills, congestion, cough, SOB, CP.  Past Medical History  Diagnosis Date  . Hypertension   . Allergy     RHINITIS  . Diverticulosis   . Colonic polyp   . Stroke     History reviewed. No pertinent past surgical history.  Family History  Problem Relation Age of Onset  . Cancer Mother   . Arthritis Mother   . Heart disease Mother   . Cancer Father   . Cancer Sister     History  Substance Use Topics  . Smoking status: Never Smoker   . Smokeless tobacco: Never Used  . Alcohol Use: No      Review of Systems  Constitutional: Negative for fever and chills.  HENT: Negative for sore throat, neck pain, neck stiffness and sinus pressure.   Respiratory: Negative for cough and shortness of breath.   Cardiovascular: Negative for chest pain, palpitations and leg swelling.  Gastrointestinal: Negative for nausea, vomiting and abdominal pain.  Musculoskeletal: Negative for back pain and arthralgias.  Skin: Negative for rash and wound.  Neurological: Positive for seizures and syncope. Negative for headaches.  Psychiatric/Behavioral: Positive for confusion.    Allergies  Review of patient's allergies indicates no known allergies.  Home Medications   Current Outpatient Rx  Name Route Sig Dispense Refill  . AMLODIPINE BESYLATE 10 MG PO TABS Oral Take 1 tablet (10 mg total) by mouth  daily. 30 tablet 0  . ASPIRIN 81 MG PO CHEW Oral Chew 1 tablet (81 mg total) by mouth daily.    Marland Kitchen CLOPIDOGREL BISULFATE 75 MG PO TABS Oral Take 1 tablet (75 mg total) by mouth daily with breakfast. 30 tablet 1  . IRBESARTAN 150 MG PO TABS Oral Take 1 tablet (150 mg total) by mouth daily. 30 tablet 1  . NEBIVOLOL HCL 10 MG PO TABS Oral Take 1 tablet (10 mg total) by mouth daily. 30 tablet 0  . LEVETIRACETAM 500 MG PO TABS Oral Take 1 tablet (500 mg total) by mouth every 12 (twelve) hours. 60 tablet 0    BP 127/72  Pulse 91  Temp 97.9 F (36.6 C) (Oral)  Resp 22  SpO2 97%  Physical Exam  Nursing note and vitals reviewed. Constitutional: He is oriented to person, place, and time. He appears well-developed and well-nourished. No distress.  HENT:  Head: Normocephalic and atraumatic.  Mouth/Throat: Oropharynx is clear and moist.       Redness to tip of tongue. No sinus tenderness to percussion  Eyes: EOM are normal. Pupils are equal, round, and reactive to light.  Neck: Normal range of motion. Neck supple.       No posterior cervical TTP. No meningismus   Cardiovascular: Normal rate and regular rhythm.   Pulmonary/Chest: Effort normal and breath sounds normal. No respiratory distress. He has no wheezes. He has no rales.  Abdominal: Soft. Bowel sounds are normal. There is no  tenderness. There is no rebound and no guarding.  Musculoskeletal: Normal range of motion. He exhibits no edema and no tenderness.  Neurological: He is alert and oriented to person, place, and time.       L lateral visual field deficit. LUE numbness. 4/5 LUE/LLE motor. 5/5 RUE/RLE motor  Skin: Skin is warm and dry. No rash noted. No erythema.  Psychiatric: He has a normal mood and affect. His behavior is normal.    ED Course  Procedures (including critical care time)  Labs Reviewed  COMPREHENSIVE METABOLIC PANEL - Abnormal; Notable for the following:    Glucose, Bld 143 (*)     Creatinine, Ser 1.51 (*)     GFR  calc non Af Amer 49 (*)     GFR calc Af Amer 57 (*)     All other components within normal limits  CBC - Abnormal; Notable for the following:    WBC 15.8 (*)     All other components within normal limits  DIFFERENTIAL - Abnormal; Notable for the following:    Neutrophils Relative 86 (*)     Neutro Abs 13.6 (*)     Lymphocytes Relative 6 (*)     All other components within normal limits  GLUCOSE, CAPILLARY - Abnormal; Notable for the following:    Glucose-Capillary 131 (*)     All other components within normal limits   Ct Head Wo Contrast  02/19/2012  *RADIOLOGY REPORT*  Clinical Data: Seizures.  CT HEAD WITHOUT CONTRAST  Technique:  Contiguous axial images were obtained from the base of the skull through the vertex without contrast.  Comparison: MRI of the brain 12/07/2011.  Findings: Compared to the prior study there is a new well-defined area of low attenuation in the right posterior cerebral artery circulation, consistent with developing encephalomalacia related to prior right PCA territory infarction.  There is a background of very mild cerebral and cerebellar atrophy.  As well, there are patchy and confluent areas of decreased attenuation throughout the deep and periventricular white matter of the cerebral hemispheres bilaterally, similar to prior examinations, compatible with chronic microvascular ischemic changes.  Old lacunar infarction in the head of the right caudate nucleus is again noted.  No definite acute intracranial abnormalities.  Specifically, no definite signs of acute/subacute cerebral ischemia (although, upon this background of chronic ischemic changes, accurate assessment for subtle areas of early ischemia is challenging), no acute intracranial hemorrhage, no focal mass, mass effect, hydrocephalus or abnormal intra or extra-axial fluid collections.  No acute displaced skull fractures are identified.  Mastoids are well pneumatized bilaterally.  There is extensive mucosal  thickening throughout the visualized paranasal sinuses, particularly in the ethmoid and maxillary sinuses, and there are air fluid levels layering dependently in the maxillary sinuses bilaterally, suggesting acute sinusitis.  IMPRESSION: 1.  Extensive chronic ischemic changes in the brain are redemonstrated, as above, including a developing area of encephalomalacia in the right PCA territory related to prior PCA territory infarction (see brain MRI from 12/07/2011).  No definite acute intracranial abnormality. 2.  Mild cerebral and cerebellar atrophy. 3.  Findings suggestive of bilateral maxillary acute sinusitis, as above.  Original Report Authenticated By: Florencia Reasons, M.D.   Dg Chest Port 1 View  02/19/2012  *RADIOLOGY REPORT*  Clinical Data: Seizure  PORTABLE CHEST - 1 VIEW  Comparison: 12/06/2011  Findings: Normal heart size.  Low lung volumes.  No consolidation or mass.  No pneumothorax or pleural effusion.  IMPRESSION: No active cardiopulmonary disease.  Original Report Authenticated By: Donavan Burnet, M.D.     1. New onset seizure      Date: 02/19/2012  Rate:83  Rhythm: normal sinus rhythm  QRS Axis: normal  Intervals: normal  ST/T Wave abnormalities: normal  Conduction Disutrbances:none  Narrative Interpretation:   Old EKG Reviewed: unchanged    MDM   Will discuss with Neurology.   Dr Roseanne Reno advises loading with 1 gram of Keppra and then starting 500 mg BID and following up with his neurologist.       Loren Racer, MD 02/19/12 1850

## 2012-02-19 NOTE — Discharge Instructions (Signed)
No driving or operating heavy machinery. Follow up with your Neurologist. Return for sustained symptoms.  Epilepsy A seizure (convulsion) is a sudden change in brain function that causes a change in behavior, muscle activity, or ability to remain awake and alert. If a person has recurring seizures, this is called epilepsy. CAUSES  Epilepsy is a disorder with many possible causes. Anything that disturbs the normal pattern of brain cell activity can lead to seizures. Seizure can be caused from illness to brain damage to abnormal brain development. Epilepsy may develop because of:  An abnormality in brain wiring.   An imbalance of nerve signaling chemicals (neurotransmitters).   Some combination of these factors.  Scientists are learning an increasing amount about genetic causes of seizures. SYMPTOMS  The symptoms of a seizure can vary greatly from one person to another. These may include:  An aura, or warning that tells a person they are about to have a seizure.   Abnormal sensations, such as abnormal smell or seeing flashing lights.   Sudden, general body stiffness.   Rhythmic jerking of the face, arm, or leg - on one or both sides.   Sudden change in consciousness.   The person may appear to be awake but not responding.   They may appear to be asleep but cannot be awakened.   Grimacing, chewing, lip smacking, or drooling.   Often there is a period of sleepiness after a seizure.  DIAGNOSIS  The description you give to your caregiver about what you experienced will help them understand your problems. Equally important is the description by any witnesses to your seizure. A physical exam, including a detailed neurological exam, is necessary. An EEG (electroencephalogram) is a painless test of your brain waves. In this test a diagram is created of your brain waves. These diagrams can be interpreted by a specialist. Pictures of your brain are usually taken with:  An MRI.   A CT scan.    Lab tests may be done to look for:  Signs of infection.   Abnormal blood chemistry.  PREVENTION  There is no way to prevent the development of epilepsy. If you have seizures that are typically triggered by an event (such as flashing lights), try to avoid the trigger. This can help you avoid a seizure.  PROGNOSIS  Most people with epilepsy lead outwardly normal lives. While epilepsy cannot currently be cured, for some people it does eventually go away. Most seizures do not cause brain damage. It is not uncommon for people with epilepsy, especially children, to develop behavioral and emotional problems. These problems are sometimes the consequence of medicine for seizures or social stress. For some people with epilepsy, the risk of seizures restricts their independence and recreational activities. For example, some states refuse drivers licenses to people with epilepsy. Most women with epilepsy can become pregnant. They should discuss their epilepsy and the medicine they are taking with their caregivers. Women with epilepsy have a 90 percent or better chance of having a normal, healthy baby. RISKS AND COMPLICATIONS  People with epilepsy are at increased risk of falls, accidents, and injuries. People with epilepsy are at special risk for two life-threatening conditions. These are status epilepticus and sudden unexplained death (extremely rare). Status epilepticus is a long lasting, continuous seizure that is a medical emergency. TREATMENT  Once epilepsy is diagnosed, it is important to begin treatment as soon as possible. For about 80 percent of those diagnosed with epilepsy, seizures can be controlled with modern medicines and  surgical techniques. Some antiepileptic drugs can interfere with the effectiveness of oral contraceptives. In 1997, the FDA approved a pacemaker for the brain the (vagus nerve stimulator). This stimulator can be used for people with seizures that are not well-controlled by  medicine. Studies have shown that in some cases, children may experience fewer seizures if they maintain a strict diet. The strict diet is called the ketogenic diet. This diet is rich in fats and low in carbohydrates. HOME CARE INSTRUCTIONS   Your caregiver will make recommendations about driving and safety in normal activities. Follow these carefully.   Take any medicine prescribed exactly as directed.   Do any blood tests requested to monitor the levels of your medicine.   The people you live and work with should know that you are prone to seizures. They should receive instructions on how to help you. In general, a witness to a seizure should:   Cushion your head and body.   Turn you on your side.   Avoid unnecessarily restraining you.   Not place anything inside your mouth.   Call for local emergency medical help if there is any question about what has occurred.   Keep a seizure diary. Record what you recall about any seizure, especially any possible trigger.   If your caregiver has given you a follow-up appointment, it is very important to keep that appointment. Not keeping the appointment could result in permanent injury and disability. If there is any problem keeping the appointment, you must call back to this facility for assistance.  SEEK MEDICAL CARE IF:   You develop signs of infection or other illness. This might increase the risk of a seizure.   You seem to be having more frequent seizures.   Your seizure pattern is changing.  SEEK IMMEDIATE MEDICAL CARE IF:   A seizure does not stop after a few moments.   A seizure causes any difficulty in breathing.   A seizure results in a very severe headache.   A seizure leaves you with the inability to speak or use a part of your body.  MAKE SURE YOU:   Understand these instructions.   Will watch your condition.   Will get help right away if you are not doing well or get worse.  Document Released: 08/20/2005 Document  Revised: 08/09/2011 Document Reviewed: 03/26/2008 [facility] Patient Information 2012 [place], [place].

## 2012-02-19 NOTE — ED Notes (Signed)
MD made aware pt's Keppra has finished and pt is ready for discharge.

## 2012-02-19 NOTE — ED Notes (Signed)
Patient reported to have new onset seizure today.  Patient has hx of cva in April.  Patient reported to have full body seizure x 3,  Each one lasting 1 min.  Patient was found in the floor with snoring respirations.  Oral trauma noted to tongue.  Patient repositioned and airway improved.  Patient aroused enroute after approx 15 min.  Patient with no new deficits since the seizure.  He has left sided seizure from recent cva.  Patient cbg was reported to be 125.  bp 144/78, sinus on monitor, rate of 92.  95 percent on room air.  Iv placed by ems

## 2012-02-19 NOTE — ED Notes (Signed)
Patient transported to CT 

## 2012-02-19 NOTE — ED Notes (Signed)
Pt asking for something to eat/drink. MD made aware and told to wait until neurology consults.

## 2012-02-19 NOTE — ED Notes (Signed)
Pt given drink and sandwich

## 2012-02-20 ENCOUNTER — Ambulatory Visit: Payer: BC Managed Care – PPO | Admitting: Physical Therapy

## 2012-02-20 ENCOUNTER — Encounter: Payer: BC Managed Care – PPO | Admitting: Occupational Therapy

## 2012-02-20 ENCOUNTER — Telehealth: Payer: Self-pay | Admitting: Physical Medicine & Rehabilitation

## 2012-02-20 NOTE — Telephone Encounter (Signed)
PLEASE CALL ASAP - patient had seizure yesterday, given IV Keppra at 615pm.  Wife states patient was up and down all night and antsy(?).  Could this be from medication or the seizure?  Also, patient has been confused about his house saying he is in the wrong house, this has been going on since last week.  They do not want to but the Keppra if this could be the problem, please call ASAP as he is supposed get the Keppra BID.

## 2012-02-20 NOTE — Telephone Encounter (Signed)
I spoke with Craig [name].  Craig is ok today, but was very agitated last night, up and down a lot which is not his usual, and she was concerned it was the IV Keppra given around 6 pm in the ER. I spoke with Dr Riley Kill and he said more likely it would be sedating so doubtful it was the Keppra. I told her if she had further concerns she should call to PCP or neurologist as Dr Wynn Banker was not in the office today. I told her if she felt he was acutely worse, by all means take him back to the ER. She reiterated he was ok today. I offered to move his appointment to an earlier date if available since his current appt was for the 25th.  An appointment was rescheduled for 02/21/12 with Dr Wynn Banker.

## 2012-02-21 ENCOUNTER — Encounter: Payer: Self-pay | Admitting: Physical Medicine & Rehabilitation

## 2012-02-21 ENCOUNTER — Ambulatory Visit (HOSPITAL_BASED_OUTPATIENT_CLINIC_OR_DEPARTMENT_OTHER): Payer: BC Managed Care – PPO | Admitting: Physical Medicine & Rehabilitation

## 2012-02-21 ENCOUNTER — Encounter: Payer: BC Managed Care – PPO | Attending: Physical Medicine & Rehabilitation

## 2012-02-21 VITALS — BP 133/65 | HR 60 | Resp 14 | Ht 70.0 in | Wt 194.0 lb

## 2012-02-21 DIAGNOSIS — H5340 Unspecified visual field defects: Secondary | ICD-10-CM | POA: Insufficient documentation

## 2012-02-21 DIAGNOSIS — H539 Unspecified visual disturbance: Secondary | ICD-10-CM | POA: Insufficient documentation

## 2012-02-21 DIAGNOSIS — I69993 Ataxia following unspecified cerebrovascular disease: Secondary | ICD-10-CM | POA: Insufficient documentation

## 2012-02-21 DIAGNOSIS — I69998 Other sequelae following unspecified cerebrovascular disease: Secondary | ICD-10-CM | POA: Insufficient documentation

## 2012-02-21 DIAGNOSIS — R209 Unspecified disturbances of skin sensation: Secondary | ICD-10-CM

## 2012-02-21 DIAGNOSIS — G40209 Localization-related (focal) (partial) symptomatic epilepsy and epileptic syndromes with complex partial seizures, not intractable, without status epilepticus: Secondary | ICD-10-CM

## 2012-02-21 NOTE — Progress Notes (Signed)
Subjective:    Patient ID: Craig [name], male    DOB: 01/10/1951, 61 y.o.   MRN: [id number]  HPI Had a seizure that consisted of shaking of the head as well as biting his tongue.No uncontrolled arm or leg movements. Patient does not remember this at all [facility] to the emergency department. CT of the head was negative for new stroke. Has been a bit drowsy for last 2 days but seems to lot brighter today. Has not resumed physical therapy yet. No prior seizure events. Some complaints of shortness of breath Review of systems positive for shortness of breath when he lays down negative for leg swelling negative for chest pain. Pain Inventory Average Pain 0 Pain Right Now 0 My pain is aching  In the last 24 hours, has pain interfered with the following? General activity 0 Relation with others 0 Enjoyment of life 0 What TIME of day is your pain at its worst? n/a Sleep (in general) Good  Pain is worse with: some activites Pain improves with: heat/ice Relief from Meds: 0  Mobility walk with assistance how many minutes can you walk? 5-10 ability to climb steps?  no do you drive?  no use a wheelchair Do you have any goals in this area?  yes  Function disabled: date disabled  I need assistance with the following:  dressing, bathing, meal prep, household duties and shopping Do you have any goals in this area?  yes  Neuro/Psych tingling  Prior Studies Any changes since last visit?  no  Physicians involved in your care Any changes since last visit?  no   Family History  Problem Relation Age of Onset  . Cancer Mother   . Arthritis Mother   . Heart disease Mother   . Cancer Father   . Cancer Sister    History   Social History  . Marital Status: Married    Spouse Name: N/A    Number of Children: N/A  . Years of Education: N/A   Social History Main Topics  . Smoking status: Never Smoker   . Smokeless tobacco: Never Used  . Alcohol Use: No  . Drug Use: No  . Sexually  Active: Yes   Other Topics Concern  . None   Social History Narrative  . None   History reviewed. No pertinent past surgical history. Past Medical History  Diagnosis Date  . Hypertension   . Allergy     RHINITIS  . Diverticulosis   . Colonic polyp   . Stroke    BP 133/65  Pulse 60  Resp 14  Ht 5\' 10"  (1.778 m)  Wt 194 lb (87.998 kg)  BMI 27.84 kg/m2  SpO2 99%    Review of Systems  Psychiatric/Behavioral: Positive for confusion.  All other systems reviewed and are negative.       Objective:   Physical Exam  Constitutional: He is oriented to person, place, and time.  Cardiovascular: Normal rate and regular rhythm.   Pulmonary/Chest: Effort normal and breath sounds normal.  Neurological: He is alert and oriented to person, place, and time. He has normal strength. A sensory deficit is present. Coordination and gait abnormal.       Sensory ataxia  Psychiatric: He has a normal mood and affect.   Visual fields show a dense left field cut       Assessment & Plan:  1. 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 I recommend that he follows up with Dr. Pearlean Brownie within the next month. Instructed patient to call 911 if any side of seizure activity.  Went over signs and symptoms of seizure. Printout of patient information. May resume physical therapy line I'll see the patient one month

## 2012-02-21 NOTE — Patient Instructions (Addendum)
Make appt with Dr Pearlean Brownie in the next month. Call 911 if sezure activity is seen [facility] Patient Information [address] [place].Seizure, Adult A seizure is when the body shakes uncontrollably (convulsion). It can be a scary experience. A seizure is not a diagnosis. It is a sign that something else may be wrong with brain and/or spinal cord (central nervous system). In the Emergency Department, your condition is evaluated. The seizure is then treated. You will likely need follow-up with your caregiver. You will possibly need further testing and evaluation. Your caregiver or the specialist to whom you are referred will determine if further treatment is needed. After a seizure, you may be confused, dazed and drowsy. These problems (symptoms) often follow a seizure. Medication given to treat the seizure may also cause some of these changes. The time following a seizure is known as a refractory period. Hospital admission is seldom required unless there are other conditions present such as trauma or metabolic problems. Sometimes the seizure activity follows a fainting episode. This may have been caused by a brief drop in blood pressure. These fainting (syncopal) seizures are generally not a cause for concern.  HOME CARE INSTRUCTIONS   Follow up with your caregiver as suggested.   If any problems happen, get help right away.   Do not swim or drive until your caregiver says it is okay.  Document Released: 08/17/2000 Document Revised: 08/09/2011 Document Reviewed: 08/08/2011 [facility] Patient Information 2012 [place], [place].

## 2012-02-22 ENCOUNTER — Telehealth: Payer: Self-pay | Admitting: Physical Medicine & Rehabilitation

## 2012-02-22 NOTE — Telephone Encounter (Signed)
Patient on Keppra.  Side effects?  Last night, became very angry when asked to go to table for dinner.  Speech is slow and he repeats.  Should she continue Keppra?  Please call.

## 2012-02-22 NOTE — Telephone Encounter (Signed)
Pt wife aware that it will be fine to give him medication at 7pm.

## 2012-02-22 NOTE — Telephone Encounter (Signed)
Advised pt wife to take pt to ER. She states that he is fine now but if it continues she is going to take him.

## 2012-02-22 NOTE — Telephone Encounter (Signed)
Patient did not get seizure meds until 815.  Can she give at 7pm as she normally does or is she now stuck in the 800 schedule?  She does not want to OD.

## 2012-02-25 ENCOUNTER — Ambulatory Visit: Payer: BC Managed Care – PPO | Admitting: Speech Pathology

## 2012-02-25 ENCOUNTER — Ambulatory Visit (INDEPENDENT_AMBULATORY_CARE_PROVIDER_SITE_OTHER): Payer: BC Managed Care – PPO | Admitting: Family Medicine

## 2012-02-25 ENCOUNTER — Ambulatory Visit: Payer: BC Managed Care – PPO | Admitting: Physical Therapy

## 2012-02-25 ENCOUNTER — Ambulatory Visit: Payer: BC Managed Care – PPO | Admitting: Occupational Therapy

## 2012-02-25 ENCOUNTER — Telehealth: Payer: Self-pay | Admitting: *Deleted

## 2012-02-25 ENCOUNTER — Encounter: Payer: Self-pay | Admitting: Family Medicine

## 2012-02-25 VITALS — BP 140/80 | HR 57

## 2012-02-25 DIAGNOSIS — G40209 Localization-related (focal) (partial) symptomatic epilepsy and epileptic syndromes with complex partial seizures, not intractable, without status epilepticus: Secondary | ICD-10-CM

## 2012-02-25 DIAGNOSIS — I639 Cerebral infarction, unspecified: Secondary | ICD-10-CM

## 2012-02-25 DIAGNOSIS — I6350 Cerebral infarction due to unspecified occlusion or stenosis of unspecified cerebral artery: Secondary | ICD-10-CM

## 2012-02-25 NOTE — Progress Notes (Signed)
  Subjective:    Patient ID: Craig [name], male    DOB: [birth year], 61 y.o.   MRN: [id number]  HPI He is here for a followup visit. He had a witnessed seizure on June 18 and was evaluated in the emergency room. Blood work and CT scanning was done. He was placed on Keppra. He was seen 2 days later by Dr. Wynn Banker recommend he followup with his neurologist. Since his seizure he has had difficulty with confusion as well as his memory. His wife states that she can tell him something and several minutes later he will have forgotten it.   Review of Systems     Objective:   Physical Exam alert and in no distress. Oriented person and place however he states since Tuesday ,April 2012.Tympanic membranes and canals are normal. Throat is clear. Tonsils are normal. Neck is supple without adenopathy or thyromegaly. Cardiac exam shows a regular sinus rhythm without murmurs or gallops. Lungs are clear to auscultation. The emergency room record and from Dr. Wynn Banker was reviewed.       Assessment & Plan:   1. CVA (cerebral infarction)   2. Complex partial epileptic seizure    I explained that this could be part of the whole CVA process. I doubt this is related to the Keppra. We will make an attempt to get him in to see his neurologist sooner than the scheduled September timeframe.

## 2012-02-25 NOTE — Telephone Encounter (Signed)
Once letter is ready it needs to be faxed to Marena Chancy at [phone].

## 2012-02-25 NOTE — Telephone Encounter (Signed)
Needs a letter from Dr. Wynn Banker stating pt condition and that he can't go back to work right now. His FMLA time has run out and will need letter to get extended time. Needs ASAP.

## 2012-02-26 ENCOUNTER — Encounter: Payer: Self-pay | Admitting: Physical Medicine & Rehabilitation

## 2012-02-26 ENCOUNTER — Encounter: Payer: Self-pay | Admitting: *Deleted

## 2012-02-26 NOTE — Telephone Encounter (Signed)
Pt wife called again today in regards to letter

## 2012-02-26 NOTE — Telephone Encounter (Signed)
Letter has been written and it has been faxed to [employer] and Mrs. Neva Seat per her request.

## 2012-02-26 NOTE — Telephone Encounter (Signed)
Will try to get something ready by end of week inform wife of charge

## 2012-02-27 ENCOUNTER — Ambulatory Visit: Payer: BC Managed Care – PPO | Admitting: Speech Pathology

## 2012-02-27 ENCOUNTER — Ambulatory Visit: Payer: BC Managed Care – PPO | Admitting: Occupational Therapy

## 2012-02-27 ENCOUNTER — Ambulatory Visit: Payer: BC Managed Care – PPO | Admitting: Physical Therapy

## 2012-02-27 DIAGNOSIS — Z0271 Encounter for disability determination: Secondary | ICD-10-CM

## 2012-02-29 ENCOUNTER — Ambulatory Visit: Payer: BC Managed Care – PPO | Admitting: Physical Medicine & Rehabilitation

## 2012-03-03 ENCOUNTER — Telehealth: Payer: Self-pay | Admitting: *Deleted

## 2012-03-03 ENCOUNTER — Ambulatory Visit: Payer: BC Managed Care – PPO

## 2012-03-03 ENCOUNTER — Ambulatory Visit: Payer: BC Managed Care – PPO | Admitting: Occupational Therapy

## 2012-03-03 ENCOUNTER — Ambulatory Visit: Payer: BC Managed Care – PPO | Attending: Neurology | Admitting: Physical Therapy

## 2012-03-03 DIAGNOSIS — R279 Unspecified lack of coordination: Secondary | ICD-10-CM | POA: Insufficient documentation

## 2012-03-03 DIAGNOSIS — I69919 Unspecified symptoms and signs involving cognitive functions following unspecified cerebrovascular disease: Secondary | ICD-10-CM | POA: Insufficient documentation

## 2012-03-03 DIAGNOSIS — I69998 Other sequelae following unspecified cerebrovascular disease: Secondary | ICD-10-CM | POA: Insufficient documentation

## 2012-03-03 DIAGNOSIS — R269 Unspecified abnormalities of gait and mobility: Secondary | ICD-10-CM | POA: Insufficient documentation

## 2012-03-03 DIAGNOSIS — R4789 Other speech disturbances: Secondary | ICD-10-CM | POA: Insufficient documentation

## 2012-03-03 DIAGNOSIS — Z5189 Encounter for other specified aftercare: Secondary | ICD-10-CM | POA: Insufficient documentation

## 2012-03-03 DIAGNOSIS — M2420 Disorder of ligament, unspecified site: Secondary | ICD-10-CM | POA: Insufficient documentation

## 2012-03-03 DIAGNOSIS — M629 Disorder of muscle, unspecified: Secondary | ICD-10-CM | POA: Insufficient documentation

## 2012-03-03 NOTE — Telephone Encounter (Signed)
Harvel Ricks wrote for Mr Ernsberger to be on 150 mg Avapro when her left the hospital.  Dr Cleotis Lema called in last RX as 300 mg. Please call

## 2012-03-03 NOTE — Telephone Encounter (Signed)
Spoke with Mrs Ace and explained to her that her medication was filled by our PA when he left the hospital (Dan Angiulli) but that future refills come from the PCP office for BP/cardiac/ etc medications.  She will follow up with Dr Susann Givens.

## 2012-03-05 ENCOUNTER — Ambulatory Visit: Payer: BC Managed Care – PPO | Admitting: Physical Therapy

## 2012-03-05 ENCOUNTER — Ambulatory Visit: Payer: BC Managed Care – PPO | Admitting: *Deleted

## 2012-03-05 ENCOUNTER — Ambulatory Visit: Payer: BC Managed Care – PPO

## 2012-03-10 ENCOUNTER — Telehealth: Payer: Self-pay | Admitting: *Deleted

## 2012-03-10 NOTE — Telephone Encounter (Signed)
Message copied by Doreene Eland on Mon Mar 10, 2012  8:41 AM ------      Message from: Erick Colace      Created: Tue Mar 04, 2012  2:31 PM      Regarding: Please help get pt appt                   ----- Message -----         From: Micki Riley, MD         Sent: 03/04/2012   9:49 AM           To: Erick Colace, MD            Yes. If patient can call and let mu nurse sandy know we will see him this month            Pramod      ----- Message -----         From: Erick Colace, MD         Sent: 02/21/2012  12:15 PM           To: Micki Riley, MD            Above patient had post CVA seizure on 6/18 has appt in Sept.  Can you move this up?            Ricarda Frame

## 2012-03-10 NOTE — Telephone Encounter (Signed)
I spoke with Craig [name]. Appointment was made and they are to see Dr Pearlean Brownie 03/12/12

## 2012-03-17 ENCOUNTER — Telehealth: Payer: Self-pay | Admitting: Family Medicine

## 2012-03-17 MED ORDER — LEVETIRACETAM 500 MG PO TABS: 500.0000 mg | ORAL_TABLET | Freq: Two times a day (BID) | ORAL | Status: DC

## 2012-03-17 NOTE — Telephone Encounter (Signed)
Keppra renewed

## 2012-03-18 ENCOUNTER — Telehealth: Payer: Self-pay | Admitting: Physical Medicine & Rehabilitation

## 2012-03-18 NOTE — Telephone Encounter (Signed)
Pt wife is concerned with the costs of all the appointments. He has an appointment with [place] on 03/25/12 and would like to cancel for now.

## 2012-03-18 NOTE — Telephone Encounter (Signed)
Please call and explain why Craig [name] has to see 3 different MD's and all are doing the same thing.

## 2012-03-20 ENCOUNTER — Telehealth: Payer: Self-pay | Admitting: Family Medicine

## 2012-03-20 ENCOUNTER — Telehealth: Payer: Self-pay

## 2012-03-20 MED ORDER — AMLODIPINE BESYLATE 10 MG PO TABS: 10.0000 mg | ORAL_TABLET | Freq: Every day | ORAL | Status: DC

## 2012-03-20 MED ORDER — NEBIVOLOL HCL 10 MG PO TABS: 10.0000 mg | ORAL_TABLET | Freq: Every day | ORAL | Status: DC

## 2012-03-20 NOTE — Telephone Encounter (Signed)
Just have him follow up with me.

## 2012-03-20 NOTE — Telephone Encounter (Signed)
Dr.Lalonde Mrs.Alexa called and left me a message to find out if you still wanted them to keep future appt with Dr.Sethi we had his appt moved up from Sept to July 10,2013 @ 2 pm they went to appt she said it was a wasted trip Pearlean Brownie was called to hospital and someone else came in and knew nothing of his seizure  Even thought it was told to them in order to get appt moved up and also his pain Dr.sent the same message but she said they did nothing any different than you please advise

## 2012-03-20 NOTE — Telephone Encounter (Signed)
Called keppra in again and sent in other 2 meds

## 2012-03-20 NOTE — Telephone Encounter (Signed)
Wife called [facility] and they do not have the refill for the Keppra that we did on 7/15, she also needs Bystolic 10 mg and Amlodipie 10 mg sent to [facility] also.

## 2012-03-21 NOTE — Telephone Encounter (Signed)
Called pt phone here died so call from my cell left message for them to follow up here with [facility]

## 2012-03-24 ENCOUNTER — Telehealth: Payer: Self-pay

## 2012-03-24 ENCOUNTER — Ambulatory Visit: Payer: BC Managed Care – PPO | Admitting: Physical Therapy

## 2012-03-24 ENCOUNTER — Ambulatory Visit: Payer: BC Managed Care – PPO | Admitting: Speech Pathology

## 2012-03-24 NOTE — Telephone Encounter (Signed)
Called [place] neurologist and spoke with med records to get last ov also left word on nurse voice mail sandy asking her to call me back and let me know what the Dr.said about pt going back to work

## 2012-03-24 NOTE — Telephone Encounter (Signed)
We need to get information concerning the neurologist's thoughts on when he can return to work if at all.

## 2012-03-24 NOTE — Telephone Encounter (Signed)
Wife  Called and said his work needs a date to return to work date so he dosen't lose benefits till he gets his disability he works for [employer]  Wife said she knows he will never be able to return to work but for right now his work needs a note.Please advise

## 2012-03-25 ENCOUNTER — Ambulatory Visit: Payer: BC Managed Care – PPO | Admitting: Physical Medicine & Rehabilitation

## 2012-03-25 ENCOUNTER — Telehealth: Payer: Self-pay | Admitting: *Deleted

## 2012-03-25 NOTE — Telephone Encounter (Signed)
Has a question.  Please call.

## 2012-03-26 ENCOUNTER — Ambulatory Visit: Payer: BC Managed Care – PPO | Admitting: Physical Therapy

## 2012-03-26 ENCOUNTER — Ambulatory Visit: Payer: BC Managed Care – PPO

## 2012-03-26 ENCOUNTER — Encounter: Payer: Self-pay | Admitting: *Deleted

## 2012-03-26 ENCOUNTER — Ambulatory Visit: Payer: BC Managed Care – PPO | Admitting: Occupational Therapy

## 2012-03-26 NOTE — Telephone Encounter (Signed)
Pt wife is needing a letter with a specific date that the patient MIGHT be returning to work. She states that if we could at least put it through today's date through 03/26/13. The date can be changed at a later date if necessary.

## 2012-03-26 NOTE — Telephone Encounter (Signed)
A new letter has been written and printed for Dr. Wynn Banker to sign. Pt wife is going to call me later this afternoon and give me her fax number so that I can send it to her as soon as Dr. Wynn Banker signs it.

## 2012-03-26 NOTE — Telephone Encounter (Signed)
Please leave on my cart

## 2012-03-27 NOTE — Telephone Encounter (Signed)
Letter has been faxed to pt wife at [phone].

## 2012-03-28 ENCOUNTER — Ambulatory Visit: Payer: BC Managed Care – PPO | Admitting: Occupational Therapy

## 2012-04-01 ENCOUNTER — Telehealth: Payer: Self-pay | Admitting: Internal Medicine

## 2012-04-01 MED ORDER — NEBIVOLOL HCL 10 MG PO TABS: 10.0000 mg | ORAL_TABLET | Freq: Every day | ORAL | Status: DC

## 2012-04-01 MED ORDER — AMLODIPINE BESYLATE 10 MG PO TABS: 10.0000 mg | ORAL_TABLET | Freq: Every day | ORAL | Status: DC

## 2012-04-01 NOTE — Telephone Encounter (Signed)
Med 1 st sent in 7/18 resent med again today

## 2012-04-02 ENCOUNTER — Ambulatory Visit: Payer: BC Managed Care – PPO | Admitting: Physical Therapy

## 2012-04-02 ENCOUNTER — Ambulatory Visit: Payer: BC Managed Care – PPO | Admitting: Occupational Therapy

## 2012-04-02 ENCOUNTER — Ambulatory Visit: Payer: BC Managed Care – PPO | Admitting: Speech Pathology

## 2012-04-04 ENCOUNTER — Ambulatory Visit: Payer: BC Managed Care – PPO

## 2012-04-04 ENCOUNTER — Ambulatory Visit: Payer: BC Managed Care – PPO | Admitting: Physical Therapy

## 2012-04-04 ENCOUNTER — Ambulatory Visit: Payer: BC Managed Care – PPO | Attending: Neurology | Admitting: Occupational Therapy

## 2012-04-04 DIAGNOSIS — R4789 Other speech disturbances: Secondary | ICD-10-CM | POA: Insufficient documentation

## 2012-04-04 DIAGNOSIS — Z5189 Encounter for other specified aftercare: Secondary | ICD-10-CM | POA: Insufficient documentation

## 2012-04-04 DIAGNOSIS — M629 Disorder of muscle, unspecified: Secondary | ICD-10-CM | POA: Insufficient documentation

## 2012-04-04 DIAGNOSIS — R269 Unspecified abnormalities of gait and mobility: Secondary | ICD-10-CM | POA: Insufficient documentation

## 2012-04-04 DIAGNOSIS — I69998 Other sequelae following unspecified cerebrovascular disease: Secondary | ICD-10-CM | POA: Insufficient documentation

## 2012-04-04 DIAGNOSIS — M2420 Disorder of ligament, unspecified site: Secondary | ICD-10-CM | POA: Insufficient documentation

## 2012-04-04 DIAGNOSIS — I69919 Unspecified symptoms and signs involving cognitive functions following unspecified cerebrovascular disease: Secondary | ICD-10-CM | POA: Insufficient documentation

## 2012-04-04 DIAGNOSIS — R279 Unspecified lack of coordination: Secondary | ICD-10-CM | POA: Insufficient documentation

## 2012-04-07 ENCOUNTER — Ambulatory Visit: Payer: BC Managed Care – PPO | Admitting: Speech Pathology

## 2012-04-07 ENCOUNTER — Ambulatory Visit: Payer: BC Managed Care – PPO | Admitting: Occupational Therapy

## 2012-04-07 ENCOUNTER — Ambulatory Visit: Payer: BC Managed Care – PPO | Admitting: Physical Therapy

## 2012-04-09 ENCOUNTER — Ambulatory Visit: Payer: BC Managed Care – PPO | Admitting: Physical Therapy

## 2012-04-09 ENCOUNTER — Ambulatory Visit: Payer: BC Managed Care – PPO | Admitting: Occupational Therapy

## 2012-04-09 ENCOUNTER — Ambulatory Visit: Payer: BC Managed Care – PPO | Admitting: Speech Pathology

## 2012-04-10 ENCOUNTER — Telehealth: Payer: Self-pay

## 2012-04-10 NOTE — Telephone Encounter (Signed)
Have him set up an appointment so we can discuss this 

## 2012-04-10 NOTE — Telephone Encounter (Signed)
Pt wife Doristine Johns called and wanted to see if you thought he was on to much Levetiracetam 500 mg 1 at 7 am 1 at 7 pm The people at his rehab say he is going backward not forward with progress since the seizer please advise

## 2012-04-10 NOTE — Telephone Encounter (Signed)
Pt has appt sept4

## 2012-04-16 ENCOUNTER — Ambulatory Visit: Payer: BC Managed Care – PPO | Admitting: Speech Pathology

## 2012-04-16 ENCOUNTER — Ambulatory Visit: Payer: BC Managed Care – PPO | Admitting: Occupational Therapy

## 2012-04-16 ENCOUNTER — Ambulatory Visit: Payer: BC Managed Care – PPO | Admitting: Physical Therapy

## 2012-04-18 ENCOUNTER — Ambulatory Visit: Payer: BC Managed Care – PPO | Admitting: Physical Therapy

## 2012-04-18 ENCOUNTER — Ambulatory Visit: Payer: BC Managed Care – PPO | Admitting: Occupational Therapy

## 2012-04-18 ENCOUNTER — Ambulatory Visit: Payer: BC Managed Care – PPO

## 2012-04-21 ENCOUNTER — Ambulatory Visit: Payer: BC Managed Care – PPO | Admitting: *Deleted

## 2012-04-23 ENCOUNTER — Ambulatory Visit: Payer: BC Managed Care – PPO | Admitting: *Deleted

## 2012-04-23 ENCOUNTER — Ambulatory Visit: Payer: BC Managed Care – PPO | Admitting: Speech Pathology

## 2012-04-25 ENCOUNTER — Ambulatory Visit: Payer: BC Managed Care – PPO

## 2012-04-25 ENCOUNTER — Ambulatory Visit: Payer: BC Managed Care – PPO | Admitting: Physical Therapy

## 2012-04-29 ENCOUNTER — Ambulatory Visit: Payer: BC Managed Care – PPO | Admitting: Occupational Therapy

## 2012-04-29 ENCOUNTER — Ambulatory Visit: Payer: BC Managed Care – PPO

## 2012-04-29 ENCOUNTER — Ambulatory Visit: Payer: BC Managed Care – PPO | Admitting: Physical Therapy

## 2012-04-30 ENCOUNTER — Ambulatory Visit: Payer: BC Managed Care – PPO

## 2012-04-30 ENCOUNTER — Ambulatory Visit: Payer: BC Managed Care – PPO | Admitting: Physical Therapy

## 2012-04-30 ENCOUNTER — Other Ambulatory Visit: Payer: Self-pay | Admitting: *Deleted

## 2012-04-30 ENCOUNTER — Ambulatory Visit: Payer: BC Managed Care – PPO | Admitting: Occupational Therapy

## 2012-04-30 MED ORDER — IRBESARTAN 150 MG PO TABS: 150.0000 mg | ORAL_TABLET | Freq: Every day | ORAL | Status: DC

## 2012-05-07 ENCOUNTER — Encounter: Payer: Self-pay | Admitting: Family Medicine

## 2012-05-07 ENCOUNTER — Ambulatory Visit (INDEPENDENT_AMBULATORY_CARE_PROVIDER_SITE_OTHER): Payer: BC Managed Care – PPO | Admitting: Family Medicine

## 2012-05-07 ENCOUNTER — Ambulatory Visit: Payer: BC Managed Care – PPO

## 2012-05-07 ENCOUNTER — Ambulatory Visit: Payer: BC Managed Care – PPO | Admitting: Physical Therapy

## 2012-05-07 ENCOUNTER — Ambulatory Visit: Payer: BC Managed Care – PPO | Attending: Neurology | Admitting: Occupational Therapy

## 2012-05-07 VITALS — BP 120/70 | HR 70 | Wt 188.0 lb

## 2012-05-07 DIAGNOSIS — I69919 Unspecified symptoms and signs involving cognitive functions following unspecified cerebrovascular disease: Secondary | ICD-10-CM | POA: Insufficient documentation

## 2012-05-07 DIAGNOSIS — M629 Disorder of muscle, unspecified: Secondary | ICD-10-CM | POA: Insufficient documentation

## 2012-05-07 DIAGNOSIS — R269 Unspecified abnormalities of gait and mobility: Secondary | ICD-10-CM | POA: Insufficient documentation

## 2012-05-07 DIAGNOSIS — I6350 Cerebral infarction due to unspecified occlusion or stenosis of unspecified cerebral artery: Secondary | ICD-10-CM

## 2012-05-07 DIAGNOSIS — R4789 Other speech disturbances: Secondary | ICD-10-CM | POA: Insufficient documentation

## 2012-05-07 DIAGNOSIS — G40209 Localization-related (focal) (partial) symptomatic epilepsy and epileptic syndromes with complex partial seizures, not intractable, without status epilepticus: Secondary | ICD-10-CM

## 2012-05-07 DIAGNOSIS — I10 Essential (primary) hypertension: Secondary | ICD-10-CM

## 2012-05-07 DIAGNOSIS — F22 Delusional disorders: Secondary | ICD-10-CM

## 2012-05-07 DIAGNOSIS — I639 Cerebral infarction, unspecified: Secondary | ICD-10-CM

## 2012-05-07 DIAGNOSIS — Z5189 Encounter for other specified aftercare: Secondary | ICD-10-CM | POA: Insufficient documentation

## 2012-05-07 DIAGNOSIS — M2420 Disorder of ligament, unspecified site: Secondary | ICD-10-CM | POA: Insufficient documentation

## 2012-05-07 DIAGNOSIS — R279 Unspecified lack of coordination: Secondary | ICD-10-CM | POA: Insufficient documentation

## 2012-05-07 DIAGNOSIS — I69998 Other sequelae following unspecified cerebrovascular disease: Secondary | ICD-10-CM | POA: Insufficient documentation

## 2012-05-07 MED ORDER — PHENYTOIN SODIUM EXTENDED 300 MG PO CAPS: 300.0000 mg | ORAL_CAPSULE | Freq: Every day | ORAL | Status: DC

## 2012-05-07 NOTE — Patient Instructions (Signed)
Start the Dilantin and continue Keppra for one more week. Then the Keppra can be stopped. Followup with Dr. Pearlean Brownie in one month.

## 2012-05-07 NOTE — Progress Notes (Signed)
  Subjective:    Patient ID: Craig [name], male    DOB: 06/21/1951, 61 y.o.   MRN: [id number]  HPI He is here for a followup appointment. Past history indicates a CVA in April and seizure that occurred in July. He has been on Keppra and recently had this reduced due to sedation. He is now not taking Plavix. He is about to finish his rehabilitation. He does note increased strength on the left arm and leg. He does note some fine motor weakness. His wife also notes that he does need to be cued periodically to finish tasks. She also notes that he tends to be mentally morphology in the morning. She then went on to describe several episodes of paranoid ideation.   Review of Systems     Objective:   Physical Exam Alert and in no distress. Strength in the left arm is slightly less than the right. Right leg has strong however fine motor of the upper extremity is reduced. He also has some difficulty with walking and does use a cane.       Assessment & Plan:   1. CVA (cerebral infarction)   2. Hypertension, accelerated   3. Complex partial epileptic seizure   4. Paranoid Ideation    the case was discussed with Dr.Sethi. He recommended switching him to Dilantin 300 mg and continue Keppra for one week of overlap. The Keppra could possibly be causing the paranoid ideation .He will then followup with Dr. Pearlean Brownie in October.

## 2012-05-09 ENCOUNTER — Ambulatory Visit: Payer: BC Managed Care – PPO | Admitting: Occupational Therapy

## 2012-05-09 ENCOUNTER — Ambulatory Visit: Payer: BC Managed Care – PPO

## 2012-05-09 ENCOUNTER — Ambulatory Visit: Payer: BC Managed Care – PPO | Admitting: Physical Therapy

## 2012-05-13 ENCOUNTER — Ambulatory Visit: Payer: BC Managed Care – PPO | Admitting: Family Medicine

## 2012-05-20 ENCOUNTER — Emergency Department (HOSPITAL_COMMUNITY)
Admission: EM | Admit: 2012-05-20 | Discharge: 2012-05-20 | Disposition: A | Discharge status: Home / Self Care | Payer: BC Managed Care – PPO | Attending: Emergency Medicine | Admitting: Emergency Medicine

## 2012-05-20 ENCOUNTER — Telehealth: Payer: Self-pay | Admitting: Family Medicine

## 2012-05-20 ENCOUNTER — Encounter (HOSPITAL_COMMUNITY): Payer: Self-pay | Admitting: *Deleted

## 2012-05-20 DIAGNOSIS — R7889 Finding of other specified substances, not normally found in blood: Secondary | ICD-10-CM

## 2012-05-20 DIAGNOSIS — X58XXXA Exposure to other specified factors, initial encounter: Secondary | ICD-10-CM | POA: Insufficient documentation

## 2012-05-20 DIAGNOSIS — I10 Essential (primary) hypertension: Secondary | ICD-10-CM | POA: Insufficient documentation

## 2012-05-20 DIAGNOSIS — Z8673 Personal history of transient ischemic attack (TIA), and cerebral infarction without residual deficits: Secondary | ICD-10-CM | POA: Insufficient documentation

## 2012-05-20 DIAGNOSIS — S01502A Unspecified open wound of oral cavity, initial encounter: Secondary | ICD-10-CM | POA: Insufficient documentation

## 2012-05-20 DIAGNOSIS — Z79899 Other long term (current) drug therapy: Secondary | ICD-10-CM | POA: Insufficient documentation

## 2012-05-20 DIAGNOSIS — R7989 Other specified abnormal findings of blood chemistry: Secondary | ICD-10-CM | POA: Insufficient documentation

## 2012-05-20 DIAGNOSIS — R569 Unspecified convulsions: Secondary | ICD-10-CM | POA: Insufficient documentation

## 2012-05-20 LAB — CBC WITH DIFFERENTIAL/PLATELET
Basophils Absolute: 0 10*3/uL (ref 0.0–0.1)
Lymphocytes Relative: 5 % — ABNORMAL LOW (ref 12–46)
Lymphs Abs: 0.8 10*3/uL (ref 0.7–4.0)
Neutrophils Relative %: 88 % — ABNORMAL HIGH (ref 43–77)
Platelets: 231 10*3/uL (ref 150–400)
RBC: 5.25 MIL/uL (ref 4.22–5.81)
WBC: 16.2 10*3/uL — ABNORMAL HIGH (ref 4.0–10.5)

## 2012-05-20 LAB — BASIC METABOLIC PANEL
GFR calc Af Amer: 55 mL/min — ABNORMAL LOW (ref >90)
GFR calc non Af Amer: 48 mL/min — ABNORMAL LOW (ref >90)
Glucose, Bld: 147 mg/dL — ABNORMAL HIGH (ref 70–99)
Potassium: 3.5 mEq/L (ref 3.5–5.1)
Sodium: 141 mEq/L (ref 135–145)

## 2012-05-20 LAB — PHENYTOIN LEVEL, TOTAL: Phenytoin Lvl: 8.2 ug/mL — ABNORMAL LOW (ref 10.0–20.0)

## 2012-05-20 MED ORDER — SODIUM CHLORIDE 0.9 % IV SOLN
500.0000 mg | Freq: Once | INTRAVENOUS | Status: AC
  Administered 2012-05-20: 500 mg via INTRAVENOUS
  Filled 2012-05-20 (×2): qty 10

## 2012-05-20 MED ORDER — PHENYTOIN SODIUM EXTENDED 100 MG PO CAPS: ORAL_CAPSULE | ORAL | Status: DC

## 2012-05-20 NOTE — ED Notes (Signed)
DR. Preston Fleeting at bedside.

## 2012-05-20 NOTE — ED Provider Notes (Signed)
61 year old male with a seizure disorder and had been switched by his PCP from Keppra to Dilantin. A change was made about 2 weeks ago. He was instructed to continue taking the Keppra for one week before discontinuing it. Was discontinued because of hallucinations. He had a generalized seizure today. Currently, he is awake, alert, oriented to person but not place or time. Neurologic exam is otherwise unremarkable. Dilantin level is come back slightly subtherapeutic. He was given a partial loading dose and Dilantin dose increased to 400 mg a day.  ECG shows normal sinus rhythm with a rate of 96, no ectopy. Normal axis. Normal P wave. Normal QRS. Normal intervals. Normal ST and T waves. Impression: normal ECG. When compared with ECG of 12/06/2011, there has been improvement in nonspecific T wave changes.   Dione Booze, MD 05/20/12 1257

## 2012-05-20 NOTE — ED Notes (Addendum)
Per EMS- pt was at church when he had a witnessed seizure. Pt was eased to floor by bystander.  Seizure was reported to last 3-5 minutes with "full body shaking". Pt has been unresponsive with EMS. CBG 119. Pt noted to have trauma to tongue. Pt noted to have been incontinent of urine

## 2012-05-20 NOTE — ED Provider Notes (Signed)
History     CSN: [id number]  Arrival date & time 05/20/12  1130   First MD Initiated Contact with Patient 05/20/12 1136      Chief Complaint  Patient presents with  . Seizures   In addition to what is listed below.  Patient is a 61 year old male with past medical history relevant for prior stroke with residual left-sided weakness , seizure activity status post stroke, and hypertension who presents to the emergency department via EMS for seizure activity. Patient prior to arrival was at church function, and while at event bystanders noticed that patient had 3-5 minute episode described as patient being unresponsive with bilateral upper and lower extremity jerking.  Upon arrival of EMS patient was noted to be postictal but no seizure activity reported.  Patient's wife was contacted via phone, she reports that patient has had recent changes to his anti-seizure medications.  She states that on Friday, he stopped taking one medication per neurologist recommendations.  Review or records shows that patient was to be started on dilantin with one week overlap of continued keppra.  Upon arrival patient noted to be post-ictal and thus additional information was not able to be obtained.     (Consider location/radiation/quality/duration/timing/severity/associated sxs/prior treatment) Patient is a 61 y.o. male presenting with seizures. The history is provided by the EMS personnel and the spouse. No language interpreter was used.  Seizures  This is a recurrent problem. The current episode started less than 1 hour ago. The problem has been resolved. There was 1 seizure. The most recent episode lasted 2 to 5 minutes. Characteristics include eye deviation, rhythmic jerking and loss of consciousness. The episode was witnessed. The seizures did not continue in the ED. The seizure(s) had upper extremity and lower extremity focality. Possible causes include med or dosage change (wife reports that patient was on 2  anti-seizure medications but stopped one 4 days ago per PCP recs). There has been no fever. There were no medications administered prior to arrival.    Past Medical History  Diagnosis Date  . Hypertension   . Allergy     RHINITIS  . Diverticulosis   . Colonic polyp   . Stroke     No past surgical history on file.  Family History  Problem Relation Age of Onset  . Cancer Mother   . Arthritis Mother   . Heart disease Mother   . Cancer Father   . Cancer Sister     History  Substance Use Topics  . Smoking status: Never Smoker   . Smokeless tobacco: Never Used  . Alcohol Use: No      Review of Systems  Unable to perform ROS: Mental status change  Neurological: Positive for seizures and loss of consciousness.    Allergies  Review of patient's allergies indicates no known allergies.  Home Medications   Current Outpatient Rx  Name Route Sig Dispense Refill  . AMLODIPINE BESYLATE 10 MG PO TABS Oral Take 1 tablet (10 mg total) by mouth daily. 30 tablet 5  . ASPIRIN 81 MG PO CHEW Oral Chew 1 tablet (81 mg total) by mouth daily.    . IRBESARTAN 150 MG PO TABS Oral Take 1 tablet (150 mg total) by mouth daily. 30 tablet 1  . LEVETIRACETAM 250 MG PO TABS Oral Take 250 mg by mouth 2 (two) times daily.    . NEBIVOLOL HCL 10 MG PO TABS Oral Take 1 tablet (10 mg total) by mouth daily. 30 tablet 5  .  PHENYTOIN SODIUM EXTENDED 300 MG PO CAPS Oral Take 1 capsule (300 mg total) by mouth daily. 30 capsule 5    BP 166/98  Pulse 77  Temp 98.4 F (36.9 C) (Oral)  Resp 16  SpO2 100%  Physical Exam  Nursing note and vitals reviewed. Constitutional: He appears well-developed and well-nourished. He appears lethargic.  HENT:  Head: Normocephalic and atraumatic.  Right Ear: External ear normal.  Left Ear: External ear normal.  Nose: Nose normal.       Small, appx 3 mm in length, laceration to anterior tongue.  No active bleeding noted.    Eyes: Conjunctivae normal are normal.  Pupils are equal, round, and reactive to light.  Neck: No tracheal deviation present. No thyromegaly present.  Cardiovascular: Normal rate, regular rhythm, normal heart sounds and intact distal pulses.   Pulmonary/Chest: Effort normal and breath sounds normal. No respiratory distress.  Abdominal: Soft. Bowel sounds are normal. He exhibits no distension. There is no tenderness. There is no rebound and no guarding.  Genitourinary:       Underwear damp with what appears to be urine.    Musculoskeletal: He exhibits no edema.  Neurological: He appears lethargic. GCS eye subscore is 4. GCS verbal subscore is 1. GCS motor subscore is 5.       Patient noted to be localizing to pain in all 4 extremities.  Patient able to move LUE and LLE but not as briskly as right.    Skin: Skin is warm and dry.  Psychiatric: He has a normal mood and affect.    ED Course  Procedures (including critical care time)  Labs Reviewed  BASIC METABOLIC PANEL - Abnormal; Notable for the following:    CO2 17 (*)     Glucose, Bld 147 (*)     Creatinine, Ser 1.53 (*)     GFR calc non Af Amer 48 (*)     GFR calc Af Amer 55 (*)     All other components within normal limits  PHENYTOIN LEVEL, TOTAL - Abnormal; Notable for the following:    Phenytoin Lvl 8.2 (*)     All other components within normal limits  CBC WITH DIFFERENTIAL - Abnormal; Notable for the following:    WBC 16.2 (*)     Neutrophils Relative 88 (*)     Neutro Abs 14.3 (*)     Lymphocytes Relative 5 (*)     Monocytes Absolute 1.1 (*)     All other components within normal limits   No results found.    Date: 05/20/2012  Rate: 96  Rhythm: normal sinus rhythm  QRS Axis: normal  Intervals: normal  ST/T Wave abnormalities: nonspecific ST changes  Conduction Disutrbances:none  Narrative Interpretation:   Old EKG Reviewed: unchanged    1. Seizure   2. Subtherapeutic serum dilantin level       MDM   Patient is a 61 year old male with past  medical history of seizure after CVA in 12/2011, HTN , and residual left sided weakness who presents after seizure.  AF and VS remarkable only of initial BP of 145/113.  Upon arrival, patient was not able to follow commands and would not respond to questions.  Patient would localize to pain and it was felt that he was adequately protecting activity.  Small laceration to anterior tongue and evidence of urinary incontinence.  With reported history of recent change to seizure medication, it was felt that dilantin level and basic labs were warranted.  Review  of labs showed, dilantin level low at 8.2.  Thus, patient given 500 mg IV load.  Other labs remarkable for CO2 of 17 (likely due to seizure activity) and leukocytosis.  After monitoring in ED, patient noted to have normal mental status, A/O x3, and no further seizure activity was noted in the ED.  While awaiting loading dose of dilantin to be completed, patient moved to CDU.  Plan for discharge with close follow up, repeat dilantin level in one week, and increase of dilantin to 400 mg ER daily from 300 mg ER daily.          Johnney Ou, MD 05/20/12 1540

## 2012-05-20 NOTE — ED Notes (Signed)
Pt able to answer yes and no questions. Answered yes to history of seizures and that he takes dilantin.

## 2012-05-20 NOTE — ED Provider Notes (Signed)
Handoff from Dr. Ronald Lobo, resident. Patient with h/o seizure disorder, recently changed from Keppra to phenytoin 2/2 psychosis. Phenytoin level found to be subtherapeutic.   Patient to CDU pending 500mg  IV phenytoin. Patient will need to be d/c to home on increased dose of phenytoin (400mg  ER daily). He can follow-up with his neurologist Dr. Pearlean Brownie.   Vital signs reviewed and are as follows: Filed Vitals:   05/20/12 1540  BP: 171/101  Pulse: 78  Temp: 98.3 F (36.8 C)  Resp: 13   Exam:  Gen NAD; Heart RRR, nml S1,S2, no m/r/g; Lungs CTAB; Abd soft, NT, no rebound or guarding; Ext 2+ pedal pulses bilaterally, no edema; Neuro moves all extremities, CN II-XII without deficit, normal mood and affect.   4:02 PM Handoff to [facility] NP who will d/c when infusion complete.      Craig [name], [place] 05/20/12 [phone]

## 2012-05-20 NOTE — ED Notes (Signed)
Family at bedside. 

## 2012-05-20 NOTE — ED Notes (Signed)
Pt noted to nod head when asked his name. Minimal response to painful stimuli. Pt able to lift rt arm to scratch head. Pt moves all extremities.

## 2012-05-20 NOTE — Telephone Encounter (Signed)
PT'S WIFE CALLED AND STATED THAT HE HAD ANOTHER SEIZURE WHILE AT [facility]. HE WAS TRANSPORTED TO [place] VIA AMBULANCE. SHE JUST WANTED YOU TO BE AWARE. YOU CAN REACH HER ARE [phone] OR [phone].

## 2012-05-21 ENCOUNTER — Telehealth: Payer: Self-pay

## 2012-05-21 NOTE — ED Provider Notes (Signed)
I saw and evaluated the patient, reviewed the resident's note and I agree with the findings and plan. Please see separate ED provider note.    Mckinze Poirier, MD 05/21/12 1611 

## 2012-05-21 NOTE — Telephone Encounter (Signed)
Have him come see me after he sees Dr. Pearlean Brownie

## 2012-05-21 NOTE — ED Provider Notes (Signed)
Medical screening examination/treatment/procedure(s) were conducted as a shared visit with non-physician practitioner(s) and myself.  I personally evaluated the patient during the encounter   Dione Booze, MD 05/21/12 [phone]

## 2012-05-21 NOTE — Telephone Encounter (Signed)
Pt informed

## 2012-05-21 NOTE — Telephone Encounter (Signed)
PT WIFE BARBER CALLED TO MAKE SURE YOU KNEW HE HAD ANOTHER SEZIOR AND WANTED TO KNOW IF HE NEEDS TO MAKE APPT TO SEE YOU BECAUSE HE HAS APPT WITH DR.SADIE AT THE BEGING OF OCTOBER PLEASE ADVISE

## 2012-05-29 ENCOUNTER — Encounter: Payer: Self-pay | Admitting: Gastroenterology

## 2012-06-06 ENCOUNTER — Other Ambulatory Visit: Payer: Self-pay

## 2012-06-06 ENCOUNTER — Ambulatory Visit (INDEPENDENT_AMBULATORY_CARE_PROVIDER_SITE_OTHER): Payer: BC Managed Care – PPO | Admitting: Family Medicine

## 2012-06-06 DIAGNOSIS — I69998 Other sequelae following unspecified cerebrovascular disease: Secondary | ICD-10-CM

## 2012-06-06 DIAGNOSIS — G40209 Localization-related (focal) (partial) symptomatic epilepsy and epileptic syndromes with complex partial seizures, not intractable, without status epilepticus: Secondary | ICD-10-CM

## 2012-06-06 DIAGNOSIS — I6350 Cerebral infarction due to unspecified occlusion or stenosis of unspecified cerebral artery: Secondary | ICD-10-CM

## 2012-06-06 DIAGNOSIS — R209 Unspecified disturbances of skin sensation: Secondary | ICD-10-CM

## 2012-06-06 DIAGNOSIS — Z2911 Encounter for prophylactic immunotherapy for respiratory syncytial virus (RSV): Secondary | ICD-10-CM

## 2012-06-06 DIAGNOSIS — Z23 Encounter for immunization: Secondary | ICD-10-CM

## 2012-06-06 DIAGNOSIS — I639 Cerebral infarction, unspecified: Secondary | ICD-10-CM

## 2012-06-06 MED ORDER — PHENYTOIN SODIUM EXTENDED 100 MG PO CAPS: 100.0000 mg | ORAL_CAPSULE | Freq: Every day | ORAL | Status: DC

## 2012-06-06 NOTE — Progress Notes (Signed)
  Subjective:    Patient ID: Craig [name], male    DOB: [birth year], 61 y.o.   MRN: [id number]  HPI He had another seizure about 2 weeks ago. Since then he has noted stiffness as well as pain in the shoulders as well as some stiffness in both knees. His son and wife have noticed decreased mobility. He is now needing help with ADLs including bathing and walking. They have noted no unilateral symptoms There is no history of injury with a seizure. A Dilantin level drawn on Tuesday. It is still pending. Review of Systems     Objective:   Physical Exam alert and in no distress. EOMI. Cranial nerves grossly intact. Tympanic membranes and canals are normal. Throat is clear. Tonsils are normal. Neck is supple without adenopathy or thyromegaly. Cardiac exam shows a regular sinus rhythm without murmurs or gallops. Lungs are clear to auscultation.        Assessment & Plan:   1. Complex partial epileptic seizure   2. CVA (cerebral infarction)   3. Alterations of sensations, late effect of cerebrovascular disease    I will update his immunizations including flu shot and Zostavax. Also discussed me know and call me with the Dilantin level. I will then discuss further potential intervention with the neurologist.

## 2012-06-19 ENCOUNTER — Telehealth: Payer: Self-pay

## 2012-06-19 NOTE — Telephone Encounter (Signed)
Right now we'll let the neurologist handle this. He  probably needs to followup with me in 4-6 months

## 2012-06-19 NOTE — Telephone Encounter (Signed)
She was informed.

## 2012-06-19 NOTE — Telephone Encounter (Signed)
    Craig [name] MITCHELL, CMA 06/19/2012 2:53 PM Signed  PT WIFE CALLED TO LET YOU KNOW THEY SAW DR.SADIE AND THEY ARE GOING TO GET HIM SET UP FOR MRI AND CT LABS CAME BACK WITH DILANTIN ELEVATED AT 27 SO SADIE HAD THEM STOP THE EXTRA 100 MG AND SHE WANTED TO KNOW IF YOU WANTED HIM TO COME IN AND SEE YOU PLEASE ADVISE            Created by    Lavell Islam, CMA on 06/19/2012 2:49 PM                         Visit Pharmacy     [facility] [address], [place] - [address] AT [facility] OF [address]         Contacts         Type  Contact  Phone    06/19/2012 2:49 PM  Phone (Incoming)  Kandis Mannan (Self)      06/19/2012 2:49 PM  Phone (Incoming)  Kandis Mannan (Self)  [phone]

## 2012-06-19 NOTE — Telephone Encounter (Signed)
PT WIFE CALLED TO LET YOU KNOW THEY SAW DR.SADIE AND THEY ARE GOING TO GET HIM SET UP FOR MRI AND CT LABS CAME BACK WITH DILANTIN ELEVATED AT 27 SO SADIE HAD THEM STOP THE EXTRA 100 MG AND SHE WANTED TO KNOW IF YOU WANTED HIM TO COME IN AND SEE YOU PLEASE ADVISE

## 2012-06-28 ENCOUNTER — Other Ambulatory Visit: Payer: Self-pay | Admitting: Physical Medicine & Rehabilitation

## 2012-06-30 ENCOUNTER — Other Ambulatory Visit: Payer: Self-pay | Admitting: Family Medicine

## 2012-06-30 MED ORDER — IRBESARTAN 150 MG PO TABS: 150.0000 mg | ORAL_TABLET | Freq: Every day | ORAL | Status: DC

## 2012-07-09 ENCOUNTER — Ambulatory Visit: Payer: BC Managed Care – PPO | Attending: Neurology | Admitting: Physical Therapy

## 2012-07-09 DIAGNOSIS — R279 Unspecified lack of coordination: Secondary | ICD-10-CM | POA: Insufficient documentation

## 2012-07-09 DIAGNOSIS — M629 Disorder of muscle, unspecified: Secondary | ICD-10-CM | POA: Insufficient documentation

## 2012-07-09 DIAGNOSIS — R4789 Other speech disturbances: Secondary | ICD-10-CM | POA: Insufficient documentation

## 2012-07-09 DIAGNOSIS — M2420 Disorder of ligament, unspecified site: Secondary | ICD-10-CM | POA: Insufficient documentation

## 2012-07-09 DIAGNOSIS — R269 Unspecified abnormalities of gait and mobility: Secondary | ICD-10-CM | POA: Insufficient documentation

## 2012-07-09 DIAGNOSIS — I69998 Other sequelae following unspecified cerebrovascular disease: Secondary | ICD-10-CM | POA: Insufficient documentation

## 2012-07-09 DIAGNOSIS — Z5189 Encounter for other specified aftercare: Secondary | ICD-10-CM | POA: Insufficient documentation

## 2012-07-09 DIAGNOSIS — I69919 Unspecified symptoms and signs involving cognitive functions following unspecified cerebrovascular disease: Secondary | ICD-10-CM | POA: Insufficient documentation

## 2012-07-16 ENCOUNTER — Encounter (HOSPITAL_COMMUNITY): Payer: Self-pay | Admitting: Emergency Medicine

## 2012-07-16 ENCOUNTER — Emergency Department (HOSPITAL_COMMUNITY)
Admission: EM | Admit: 2012-07-16 | Discharge: 2012-07-16 | Disposition: A | Discharge status: Home / Self Care | Payer: BC Managed Care – PPO | Attending: Emergency Medicine | Admitting: Emergency Medicine

## 2012-07-16 DIAGNOSIS — J309 Allergic rhinitis, unspecified: Secondary | ICD-10-CM | POA: Insufficient documentation

## 2012-07-16 DIAGNOSIS — I10 Essential (primary) hypertension: Secondary | ICD-10-CM | POA: Insufficient documentation

## 2012-07-16 DIAGNOSIS — Z79899 Other long term (current) drug therapy: Secondary | ICD-10-CM | POA: Insufficient documentation

## 2012-07-16 DIAGNOSIS — R569 Unspecified convulsions: Secondary | ICD-10-CM

## 2012-07-16 DIAGNOSIS — Z7982 Long term (current) use of aspirin: Secondary | ICD-10-CM | POA: Insufficient documentation

## 2012-07-16 DIAGNOSIS — K5732 Diverticulitis of large intestine without perforation or abscess without bleeding: Secondary | ICD-10-CM | POA: Insufficient documentation

## 2012-07-16 DIAGNOSIS — Z8719 Personal history of other diseases of the digestive system: Secondary | ICD-10-CM | POA: Insufficient documentation

## 2012-07-16 DIAGNOSIS — I6350 Cerebral infarction due to unspecified occlusion or stenosis of unspecified cerebral artery: Secondary | ICD-10-CM | POA: Insufficient documentation

## 2012-07-16 DIAGNOSIS — G40909 Epilepsy, unspecified, not intractable, without status epilepticus: Secondary | ICD-10-CM | POA: Insufficient documentation

## 2012-07-16 LAB — CBC
HCT: 46.6 % (ref 39.0–52.0)
MCV: 84 fL (ref 78.0–100.0)
Platelets: 229 10*3/uL (ref 150–400)
RBC: 5.55 MIL/uL (ref 4.22–5.81)
WBC: 14 10*3/uL — ABNORMAL HIGH (ref 4.0–10.5)

## 2012-07-16 MED ORDER — SODIUM CHLORIDE 0.9 % IV SOLN
INTRAVENOUS | Status: DC
  Administered 2012-07-16: 18:00:00 via INTRAVENOUS

## 2012-07-16 MED ORDER — PHENYTOIN SODIUM EXTENDED 100 MG PO CAPS
400.0000 mg | ORAL_CAPSULE | Freq: Once | ORAL | Status: AC
  Administered 2012-07-16: 400 mg via ORAL
  Filled 2012-07-16: qty 4

## 2012-07-16 MED ORDER — LORAZEPAM 1 MG PO TABS
1.0000 mg | ORAL_TABLET | Freq: Once | ORAL | Status: AC
  Administered 2012-07-16: 1 mg via ORAL
  Filled 2012-07-16: qty 1

## 2012-07-16 NOTE — ED Notes (Signed)
PER EMS- pt picked up from home with c/o seizure. Witnessed by son.  Son reports pt was having a "full body seizure" and reports length of seizure was 5-37mins.  Upon EMS arrival pt was post ectal.  Pt was conscious, but non  verbal communication x44mins.  Pt is still confused.  However son says pt's baseline is "in and out of confusion" due to hx of dementia.  Pt was recently diagnosed with seizures x6 months.  Reports that this is pt's 3rd seizure. Pt also has hx of stroke with L side deficits.  No fall or head injury.

## 2012-07-16 NOTE — ED Provider Notes (Signed)
History     CSN: [id number]  Arrival date & time 07/16/12  1731   First MD Initiated Contact with Patient 07/16/12 1758      Chief Complaint  Patient presents with  . Seizures    (Consider location/radiation/quality/duration/timing/severity/associated sxs/prior treatment) Patient is a 61 y.o. male presenting with seizures. The history is provided by the patient and the spouse. No language interpreter was used.  Seizures  This is a recurrent problem. The current episode started 1 to 2 hours ago. The problem has been resolved. There was 1 seizure. Pertinent negatives include no headaches and no chest pain. Characteristics do not include eye blinking, eye deviation, bladder incontinence or loss of consciousness.  62yo male with pmh of seizures x 6 months and CVA with L sideded weakness and dementia.  Patient had a tonic clonic witnessed seizure by his son.  Patient goes to Dr. Pearlean Brownie for care.  Recently decreased from 300mg  of dilantin a day to 200mg  a day because patient was having halllucinations. Seizure lasted 10 minutes.  Patient is alert and oriented presently.  Wife at bedside.     Past Medical History  Diagnosis Date  . Hypertension   . Allergy     RHINITIS  . Diverticulosis   . Colonic polyp   . Stroke     History reviewed. No pertinent past surgical history.  Family History  Problem Relation Age of Onset  . Cancer Mother   . Arthritis Mother   . Heart disease Mother   . Cancer Father   . Cancer Sister     History  Substance Use Topics  . Smoking status: Never Smoker   . Smokeless tobacco: Never Used  . Alcohol Use: No      Review of Systems  Constitutional: Negative.  Negative for fever.  HENT: Negative.   Eyes: Negative.  Negative for photophobia.  Respiratory: Negative.  Negative for shortness of breath.   Cardiovascular: Negative.  Negative for chest pain.  Gastrointestinal: Negative.   Genitourinary: Negative for bladder incontinence.    Neurological: Positive for seizures. Negative for dizziness, loss of consciousness, syncope and headaches.  Psychiatric/Behavioral: Negative.   All other systems reviewed and are negative.    Allergies  Review of patient's allergies indicates no known allergies.  Home Medications   Current Outpatient Rx  Name  Route  Sig  Dispense  Refill  . AMLODIPINE BESYLATE 10 MG PO TABS   Oral   Take 1 tablet (10 mg total) by mouth daily.   30 tablet   5   . ASPIRIN 81 MG PO CHEW   Oral   Chew 1 tablet (81 mg total) by mouth daily.         . IRBESARTAN 150 MG PO TABS   Oral   Take 1 tablet (150 mg total) by mouth daily.   30 tablet   11   . NEBIVOLOL HCL 10 MG PO TABS   Oral   Take 1 tablet (10 mg total) by mouth daily.   30 tablet   5   . PHENYTOIN SODIUM EXTENDED 100 MG PO CAPS   Oral   Take 200 mg by mouth every evening.           BP 154/90  Pulse 87  Temp 98.2 F (36.8 C) (Oral)  Resp 15  Ht 5\' 10"  (1.778 m)  Wt 188 lb (85.276 kg)  BMI 26.98 kg/m2  SpO2 98%  Physical Exam  Nursing note and vitals reviewed. Constitutional:  He is oriented to person, place, and time. He appears well-developed and well-nourished.  HENT:  Head: Normocephalic.  Eyes: Conjunctivae normal and EOM are normal. Pupils are equal, round, and reactive to light.  Neck: Normal range of motion. Neck supple.  Cardiovascular: Normal rate.   Pulmonary/Chest: Effort normal.  Abdominal: Soft.  Musculoskeletal: Normal range of motion.  Neurological: He is alert and oriented to person, place, and time.  Skin: Skin is warm and dry.  Psychiatric: He has a normal mood and affect.    ED Course  Procedures (including critical care time)  1900 lab consulted as to where the dilantin level is.  States it is being sent [facility] to run.   1930  [employer] returned call and said that the blood is still in lab her at [facility] waiting for courier to transport.   2000  [place] has not received  blood for dalantin level.   2245  Patient was given ativan to pervent seizure activity while level is being run.  2100 Dilantin level back.    Dilantin level 3.1.  Patient given Dilantin 400mg  po.     Labs Reviewed  CBC - Abnormal; Notable for the following:    WBC 14.0 (*)     All other components within normal limits  PHENYTOIN LEVEL, TOTAL   No results found.   No diagnosis found.    MDM  Clonic Tonic seizure activity with sub therapeutic level  of dilantin 3.1.  Will follow up with neurology tomorrow.  Dilantin 400mg  po given in the ER.  Wife agreeable to plan and is ready for discharge.  Shared visit with Dr. Denton Lank.    Labs Reviewed  CBC - Abnormal; Notable for the following:    WBC 14.0 (*)     All other components within normal limits  PHENYTOIN LEVEL, TOTAL - Abnormal; Notable for the following:    Phenytoin Lvl 3.1 (*)     All other components within normal limits  LAB REPORT - SCANNED            Remi Haggard, NP 07/17/12 1221

## 2012-07-16 NOTE — ED Notes (Signed)
Family at bedside. 

## 2012-07-16 NOTE — ED Notes (Addendum)
Unsuccessfully attempted to obtain blood for labs.Main lab phlebotomist will attempt

## 2012-07-16 NOTE — ED Notes (Signed)
[id number]<AV> Expected date:<BR> Expected time:<BR> Means of arrival:<BR> Comments:<BR> seizure

## 2012-07-18 ENCOUNTER — Telehealth: Payer: Self-pay

## 2012-07-18 NOTE — Telephone Encounter (Signed)
Error

## 2012-07-18 NOTE — ED Provider Notes (Signed)
Medical screening examination/treatment/procedure(s) were performed by non-physician practitioner and as supervising physician I was immediately available for consultation/collaboration.   Suzi Roots, MD 07/18/12 1247

## 2012-07-21 ENCOUNTER — Ambulatory Visit: Payer: BC Managed Care – PPO | Admitting: Physical Therapy

## 2012-07-25 ENCOUNTER — Ambulatory Visit: Payer: BC Managed Care – PPO | Admitting: Physical Therapy

## 2012-07-28 ENCOUNTER — Ambulatory Visit: Payer: BC Managed Care – PPO | Admitting: Physical Therapy

## 2012-07-29 ENCOUNTER — Ambulatory Visit: Payer: BC Managed Care – PPO | Admitting: Physical Therapy

## 2012-08-04 ENCOUNTER — Ambulatory Visit: Payer: BC Managed Care – PPO | Attending: Neurology | Admitting: Physical Therapy

## 2012-08-04 DIAGNOSIS — R269 Unspecified abnormalities of gait and mobility: Secondary | ICD-10-CM | POA: Insufficient documentation

## 2012-08-04 DIAGNOSIS — I69919 Unspecified symptoms and signs involving cognitive functions following unspecified cerebrovascular disease: Secondary | ICD-10-CM | POA: Insufficient documentation

## 2012-08-04 DIAGNOSIS — M629 Disorder of muscle, unspecified: Secondary | ICD-10-CM | POA: Insufficient documentation

## 2012-08-04 DIAGNOSIS — M2420 Disorder of ligament, unspecified site: Secondary | ICD-10-CM | POA: Insufficient documentation

## 2012-08-04 DIAGNOSIS — I69998 Other sequelae following unspecified cerebrovascular disease: Secondary | ICD-10-CM | POA: Insufficient documentation

## 2012-08-04 DIAGNOSIS — R279 Unspecified lack of coordination: Secondary | ICD-10-CM | POA: Insufficient documentation

## 2012-08-04 DIAGNOSIS — R4789 Other speech disturbances: Secondary | ICD-10-CM | POA: Insufficient documentation

## 2012-08-04 DIAGNOSIS — Z5189 Encounter for other specified aftercare: Secondary | ICD-10-CM | POA: Insufficient documentation

## 2012-08-07 ENCOUNTER — Encounter: Payer: Self-pay | Admitting: Family Medicine

## 2012-08-07 ENCOUNTER — Ambulatory Visit (INDEPENDENT_AMBULATORY_CARE_PROVIDER_SITE_OTHER): Payer: BC Managed Care – PPO | Admitting: Family Medicine

## 2012-08-07 VITALS — BP 130/80 | HR 60 | Temp 98.7°F | Wt 182.0 lb

## 2012-08-07 DIAGNOSIS — M25569 Pain in unspecified knee: Secondary | ICD-10-CM

## 2012-08-07 DIAGNOSIS — M25562 Pain in left knee: Secondary | ICD-10-CM

## 2012-08-07 NOTE — Progress Notes (Signed)
  Subjective:    Patient ID: Craig [name], male    DOB: 1950-11-23, 61 y.o.   MRN: [id number]  HPI He complains of feeling as if his left knee is causing some discomfort and giving away. He's had no popping, locking or grinding. His physical therapist has noted some motor function and is working on stretching as well as range of motion.   Review of Systems     Objective:   Physical Exam Alert and in no distress. No effusion noted. No tenderness to palpation. Apprehension sign is negative. Jointline is normal. Good motion of the knee.       Assessment & Plan:   1. Knee pain, left    suggested that his symptoms most likely related to the previous CVA and overall motor dysfunction. Continue with physical therapy

## 2012-08-08 ENCOUNTER — Ambulatory Visit: Payer: BC Managed Care – PPO | Admitting: Physical Therapy

## 2012-08-11 ENCOUNTER — Ambulatory Visit: Payer: BC Managed Care – PPO | Admitting: Physical Therapy

## 2012-08-13 ENCOUNTER — Ambulatory Visit: Payer: BC Managed Care – PPO | Admitting: Physical Therapy

## 2012-09-04 ENCOUNTER — Encounter (HOSPITAL_COMMUNITY): Payer: Self-pay | Admitting: *Deleted

## 2012-09-04 ENCOUNTER — Emergency Department (HOSPITAL_COMMUNITY)
Admission: EM | Admit: 2012-09-04 | Discharge: 2012-09-05 | Disposition: A | Discharge status: Home / Self Care | Payer: BC Managed Care – PPO | Attending: Emergency Medicine | Admitting: Emergency Medicine

## 2012-09-04 DIAGNOSIS — Z79899 Other long term (current) drug therapy: Secondary | ICD-10-CM | POA: Insufficient documentation

## 2012-09-04 DIAGNOSIS — Z8673 Personal history of transient ischemic attack (TIA), and cerebral infarction without residual deficits: Secondary | ICD-10-CM | POA: Insufficient documentation

## 2012-09-04 DIAGNOSIS — N390 Urinary tract infection, site not specified: Secondary | ICD-10-CM | POA: Insufficient documentation

## 2012-09-04 DIAGNOSIS — Z860100 Personal history of colon polyps, unspecified: Secondary | ICD-10-CM | POA: Insufficient documentation

## 2012-09-04 DIAGNOSIS — I10 Essential (primary) hypertension: Secondary | ICD-10-CM | POA: Insufficient documentation

## 2012-09-04 DIAGNOSIS — Z7982 Long term (current) use of aspirin: Secondary | ICD-10-CM | POA: Insufficient documentation

## 2012-09-04 DIAGNOSIS — R569 Unspecified convulsions: Secondary | ICD-10-CM

## 2012-09-04 DIAGNOSIS — R7889 Finding of other specified substances, not normally found in blood: Secondary | ICD-10-CM

## 2012-09-04 DIAGNOSIS — R748 Abnormal levels of other serum enzymes: Secondary | ICD-10-CM | POA: Insufficient documentation

## 2012-09-04 DIAGNOSIS — Z8719 Personal history of other diseases of the digestive system: Secondary | ICD-10-CM | POA: Insufficient documentation

## 2012-09-04 DIAGNOSIS — G40909 Epilepsy, unspecified, not intractable, without status epilepticus: Secondary | ICD-10-CM | POA: Insufficient documentation

## 2012-09-04 NOTE — ED Notes (Addendum)
Pt to room via EMS after witnessed seizure at home; wife witnessed seizure states it was approx 5 min and was described as tonic clonic; EMS reports that pt was postictal upon their arrival but pt alert and oriented per norm upon arrival to ER; pt reports that he had a CVA in April 2013 and has had seizures since CVA; pt has had residual left sided weakness since CVA, pt reports that weakness is per his normal; Pt denies pain, headache or injury from seizure. Per EMS no urinary incontinence during seizure but wife reported to EMS some "dribbling" of urine earlier that was not patient's normal.

## 2012-09-04 NOTE — ED Notes (Signed)
ZOX:WRUE<AV> Expected date:09/14/12<BR> Expected time:10:47 PM<BR> Means of arrival:Ambulance<BR> Comments:<BR> Seizure

## 2012-09-05 LAB — URINALYSIS, ROUTINE W REFLEX MICROSCOPIC
Bilirubin Urine: NEGATIVE
Glucose, UA: NEGATIVE mg/dL
Ketones, ur: NEGATIVE mg/dL
pH: 6.5 (ref 5.0–8.0)

## 2012-09-05 LAB — URINE MICROSCOPIC-ADD ON

## 2012-09-05 MED ORDER — SODIUM CHLORIDE 0.9 % IV SOLN
500.0000 mg | Freq: Once | INTRAVENOUS | Status: AC
  Administered 2012-09-05: 500 mg via INTRAVENOUS
  Filled 2012-09-05: qty 10

## 2012-09-05 MED ORDER — CEFTRIAXONE SODIUM 1 G IJ SOLR
1.0000 g | Freq: Once | INTRAMUSCULAR | Status: AC
  Administered 2012-09-05: 1 g via INTRAVENOUS
  Filled 2012-09-05: qty 10

## 2012-09-05 MED ORDER — NITROFURANTOIN MONOHYD MACRO 100 MG PO CAPS: 100.0000 mg | ORAL_CAPSULE | Freq: Two times a day (BID) | ORAL | Status: DC

## 2012-09-05 NOTE — ED Provider Notes (Signed)
History     CSN: [id number]  Arrival date & time 09/04/12  2251   First MD Initiated Contact with Patient 09/05/12 0020      Chief Complaint  Patient presents with  . Seizure     (Consider location/radiation/quality/duration/timing/severity/associated sxs/prior treatment) HPI This is a 62 year old male who is status post stroke in April of this your leaving him with left hemiparesis. He also has a seizure disorder as a result of his stroke. He had a witnessed, generalized tonic-clonic, five-minute seizure just prior to arrival. He was incontinent of urine as a result. He was initially postictal but has returned to his baseline mental status. He denies headache, nausea or vomiting. He denies body aches. There was no known trigger for the seizure. He states he has been taking his Dilantin as prescribed.  Past Medical History  Diagnosis Date  . Hypertension   . Allergy     RHINITIS  . Diverticulosis   . Colonic polyp   . Stroke     History reviewed. No pertinent past surgical history.  Family History  Problem Relation Age of Onset  . Cancer Mother   . Arthritis Mother   . Heart disease Mother   . Cancer Father   . Cancer Sister     History  Substance Use Topics  . Smoking status: Never Smoker   . Smokeless tobacco: Never Used  . Alcohol Use: No      Review of Systems  All other systems reviewed and are negative.    Allergies  Review of patient's allergies indicates no known allergies.  Home Medications   Current Outpatient Rx  Name  Route  Sig  Dispense  Refill  . AMLODIPINE BESYLATE 10 MG PO TABS   Oral   Take 10 mg by mouth daily with breakfast.         . ASPIRIN EC 81 MG PO TBEC   Oral   Take 81 mg by mouth daily with breakfast.         . IRBESARTAN 150 MG PO TABS   Oral   Take 150 mg by mouth daily with breakfast.         . LACOSAMIDE 50 MG PO TABS   Oral   Take 50 mg by mouth 2 (two) times daily.         . NEBIVOLOL HCL 10 MG PO  TABS   Oral   Take 10 mg by mouth daily with breakfast.         . PHENYTOIN SODIUM EXTENDED 100 MG PO CAPS   Oral   Take 100 mg by mouth as directed. Take 1 capsule twice daily on Tuesday, Thursday, Saturday, & Sunday. Take  1 capsule three times daily on Monday, Wednesday, & Friday.           BP 149/98  Pulse 81  Temp 98.1 F (36.7 C) (Oral)  Resp 14  SpO2 96%  Physical Exam General: Well-developed, well-nourished male in no acute distress; appearance consistent with age of record HENT: normocephalic, atraumatic Eyes: pupils equal round and reactive to light; extraocular muscles intact Neck: supple Heart: regular rate and rhythm Lungs: clear to auscultation bilaterally Abdomen: soft; nondistended; nontender; bowel sounds present Extremities: No deformity; full range of motion; pulses normal; no edema Neurologic: Awake, alert and oriented; left hemiparesis; mild left facial droop Skin: Warm and dry Psychiatric: Normal mood and affect    ED Course  Procedures (including critical care time)     MDM  Nursing notes and vitals signs, including pulse oximetry, reviewed.  Summary of this visit's results, reviewed by myself:  Labs:  Results for orders placed during the hospital encounter of 09/04/12 (from the past 24 hour(s))  URINALYSIS, ROUTINE W REFLEX MICROSCOPIC     Status: Abnormal   Collection Time   09/04/12 11:54 PM      Component Value Range   Color, Urine YELLOW  YELLOW   APPearance CLEAR  CLEAR   Specific Gravity, Urine 1.017  1.005 - 1.030   pH 6.5  5.0 - 8.0   Glucose, UA NEGATIVE  NEGATIVE mg/dL   Hgb urine dipstick TRACE (*) NEGATIVE   Bilirubin Urine NEGATIVE  NEGATIVE   Ketones, ur NEGATIVE  NEGATIVE mg/dL   Protein, ur 30 (*) NEGATIVE mg/dL   Urobilinogen, UA 0.2  0.0 - 1.0 mg/dL   Nitrite NEGATIVE  NEGATIVE   Leukocytes, UA MODERATE (*) NEGATIVE  URINE MICROSCOPIC-ADD ON     Status: Abnormal   Collection Time   09/04/12 11:54 PM       Component Value Range   Squamous Epithelial / LPF FEW (*) RARE   WBC, UA 11-20  <3 WBC/hpf   RBC / HPF 0-2  <3 RBC/hpf   Bacteria, UA FEW (*) RARE  PHENYTOIN LEVEL, TOTAL     Status: Abnormal   Collection Time   09/05/12 12:35 AM      Component Value Range   Phenytoin Lvl 5.4 (*) 10.0 - 20.0 ug/mL   EKG Interpretation:  Date & Time: 09/04/2012 11:04 PM  Rate: 82  Rhythm: normal sinus rhythm  QRS Axis: normal  Intervals: normal  ST/T Wave abnormalities: normal  Conduction Disutrbances:none  Narrative Interpretation: Left ventricular hypertrophy; left intraoperative  Old EKG Reviewed: unchanged             Hanley Seamen, MD 09/05/12 0981

## 2012-09-12 ENCOUNTER — Telehealth: Payer: Self-pay | Admitting: Internal Medicine

## 2012-09-12 NOTE — Telephone Encounter (Signed)
Faxed over medical records to disability determination services  

## 2012-09-16 LAB — URINE CULTURE

## 2012-09-18 ENCOUNTER — Encounter: Payer: Self-pay | Admitting: Gastroenterology

## 2012-09-25 ENCOUNTER — Encounter: Payer: Self-pay | Admitting: Gastroenterology

## 2012-09-25 ENCOUNTER — Ambulatory Visit (AMBULATORY_SURGERY_CENTER): Payer: BC Managed Care – PPO | Admitting: *Deleted

## 2012-09-25 VITALS — Ht 70.0 in | Wt 186.0 lb

## 2012-09-25 DIAGNOSIS — Z860100 Personal history of colon polyps, unspecified: Secondary | ICD-10-CM

## 2012-09-25 DIAGNOSIS — Z1211 Encounter for screening for malignant neoplasm of colon: Secondary | ICD-10-CM

## 2012-09-25 MED ORDER — NA SULFATE-K SULFATE-MG SULF 17.5-3.13-1.6 GM/177ML PO SOLN: 1.0000 | Freq: Once | ORAL | Status: DC

## 2012-09-25 NOTE — Progress Notes (Signed)
No egg or soy allergy. ewm Pt had moderate sedation with no problems. 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 Marland Kitchenewm

## 2012-10-03 ENCOUNTER — Encounter: Payer: Self-pay | Admitting: Gastroenterology

## 2012-10-08 ENCOUNTER — Telehealth: Payer: Self-pay | Admitting: Family Medicine

## 2012-10-08 NOTE — Telephone Encounter (Signed)
Is is ok for him to have colonoscopy, with his history of seizures. His last seizure was 09/04/12  Please call

## 2012-10-08 NOTE — Telephone Encounter (Signed)
He will need to clear that with the gastroenterologist

## 2012-10-14 ENCOUNTER — Ambulatory Visit (AMBULATORY_SURGERY_CENTER): Payer: BC Managed Care – PPO | Admitting: Gastroenterology

## 2012-10-14 ENCOUNTER — Encounter: Payer: Self-pay | Admitting: Gastroenterology

## 2012-10-14 VITALS — BP 163/80 | HR 54 | Temp 97.2°F | Resp 18 | Ht 70.0 in | Wt 186.0 lb

## 2012-10-14 DIAGNOSIS — Z860100 Personal history of colon polyps, unspecified: Secondary | ICD-10-CM

## 2012-10-14 DIAGNOSIS — K5730 Diverticulosis of large intestine without perforation or abscess without bleeding: Secondary | ICD-10-CM

## 2012-10-14 DIAGNOSIS — Z1211 Encounter for screening for malignant neoplasm of colon: Secondary | ICD-10-CM

## 2012-10-14 MED ORDER — SODIUM CHLORIDE 0.9 % IV SOLN: 500.0000 mL | INTRAVENOUS | Status: DC

## 2012-10-14 NOTE — Patient Instructions (Addendum)
Discharge instructions given with verbal understanding. Handouts on diverticulosis and a high fiber diet given. Resume previous medications.YOU HAD AN ENDOSCOPIC PROCEDURE TODAY AT THE [facility]: Refer to the procedure report that was given to you for any specific questions about what was found during the examination.  If the procedure report does not answer your questions, please call your gastroenterologist to clarify.  If you requested that your care partner not be given the details of your procedure findings, then the procedure report has been included in a sealed envelope for you to review at your convenience later.  YOU SHOULD EXPECT: Some feelings of bloating in the abdomen. Passage of more gas than usual.  Walking can help get rid of the air that was put into your GI tract during the procedure and reduce the bloating. If you had a lower endoscopy (such as a colonoscopy or flexible sigmoidoscopy) you may notice spotting of blood in your stool or on the toilet paper. If you underwent a bowel prep for your procedure, then you may not have a normal bowel movement for a few days.  DIET: Your first meal following the procedure should be a light meal and then it is ok to progress to your normal diet.  A half-sandwich or bowl of soup is an example of a good first meal.  Heavy or fried foods are harder to digest and may make you feel nauseous or bloated.  Likewise meals heavy in dairy and vegetables can cause extra gas to form and this can also increase the bloating.  Drink plenty of fluids but you should avoid alcoholic beverages for 24 hours.  ACTIVITY: Your care partner should take you home directly after the procedure.  You should plan to take it easy, moving slowly for the rest of the day.  You can resume normal activity the day after the procedure however you should NOT DRIVE or use heavy machinery for 24 hours (because of the sedation medicines used during the test).    SYMPTOMS TO  REPORT IMMEDIATELY: A gastroenterologist can be reached at any hour.  During normal business hours, 8:30 AM to 5:00 PM Monday through Friday, call [phone].  After hours and on weekends, please call the GI answering service at [phone] who will take a message and have the physician on call contact you.   Following lower endoscopy (colonoscopy or flexible sigmoidoscopy):  Excessive amounts of blood in the stool  Significant tenderness or worsening of abdominal pains  Swelling of the abdomen that is new, acute  Fever of 100F or higher  FOLLOW UP: If any biopsies were taken you will be contacted by phone or by letter within the next 1-3 weeks.  Call your gastroenterologist if you have not heard about the biopsies in 3 weeks.  Our staff will call the home number listed on your records the next business day following your procedure to check on you and address any questions or concerns that you may have at that time regarding the information given to you following your procedure. This is a courtesy call and so if there is no answer at the home number and we have not heard from you through the emergency physician on call, we will assume that you have returned to your regular daily activities without incident.  SIGNATURES/CONFIDENTIALITY: You and/or your care partner have signed paperwork which will be entered into your electronic medical record.  These signatures attest to the fact that that the information above on your   After Visit Summary has been reviewed and is understood.  Full responsibility of the confidentiality of this discharge information lies with you and/or your care-partner.   

## 2012-10-14 NOTE — Progress Notes (Signed)
Patient did not experience any of the following events: a burn prior to discharge; a fall within the facility; wrong site/side/patient/procedure/implant event; or a hospital transfer or hospital admission upon discharge from the facility. (G8907) Patient did not have preoperative order for IV antibiotic SSI prophylaxis. (G8918)  

## 2012-10-14 NOTE — Op Note (Signed)
[facility] [address] [place], [address]   COLONOSCOPY PROCEDURE REPORT  PATIENT: Craig [name], Craig [name]  MR#: [id number] BIRTHDATE: August 23, 1951 , 61  yrs. old GENDER: Male ENDOSCOPIST: Louis Meckel, MD REFERRED XB:JYNW Susann Givens, M.D. PROCEDURE DATE:  10/14/2012 PROCEDURE:   Colonoscopy, diagnostic ASA CLASS:   Class II INDICATIONS:2007 colon polyp. MEDICATIONS: MAC sedation, administered by CRNA and propofol (Diprivan) 200mg  IV  DESCRIPTION OF PROCEDURE:   After the risks benefits and alternatives of the procedure were thoroughly explained, informed consent was obtained.       The LB [id number]  endoscope was introduced through the anus and advanced to the cecum, which was identified by both the appendix and ileocecal valve. No adverse events experienced.   The quality of the prep was Suprep good  The instrument was then slowly withdrawn as the colon was fully examined.      COLON FINDINGS: Moderate diverticulosis was noted in the sigmoid colon.   Moderate diverticulosis was noted in the descending colon and transverse colon.   Hemorrhoids were found.   The colon mucosa was otherwise normal.  Retroflexed views revealed internal hemorrhoids. The time to cecum=3 minutes 18 seconds.  Withdrawal time=6 minutes 17 seconds.  The scope was withdrawn and the procedure completed. COMPLICATIONS: There were no complications.  ENDOSCOPIC IMPRESSION: 1.   Moderate diverticulosis was noted in the sigmoid colon 2.   Moderate diverticulosis was noted in the descending colon and transverse colon 3.   Hemorrhoids 4.   The colon mucosa was otherwise normal  RECOMMENDATIONS: Colonoscopy 10 years   eSigned:  Louis Meckel, MD 10/14/2012 2:31 PM   cc:

## 2012-10-15 ENCOUNTER — Telehealth: Payer: Self-pay | Admitting: *Deleted

## 2012-10-15 NOTE — Telephone Encounter (Signed)
  Follow up Call-  Call back number 10/14/2012  Post procedure Call Back phone  # [phone]  Permission to leave phone message Yes     Patient questions:  Do you have a fever, pain , or abdominal swelling? no Pain Score  0 *  Have you tolerated food without any problems? yes  Have you been able to return to your normal activities? yes  Do you have any questions about your discharge instructions: Diet   no Medications  no Follow up visit  no  Do you have questions or concerns about your Care? no  Actions: * If pain score is 4 or above: No action needed, pain <4.

## 2012-10-17 ENCOUNTER — Other Ambulatory Visit: Payer: Self-pay | Admitting: Family Medicine

## 2012-10-21 ENCOUNTER — Emergency Department (HOSPITAL_COMMUNITY)
Admission: EM | Admit: 2012-10-21 | Discharge: 2012-10-21 | Disposition: A | Discharge status: Home / Self Care | Payer: BC Managed Care – PPO | Attending: Emergency Medicine | Admitting: Emergency Medicine

## 2012-10-21 DIAGNOSIS — R569 Unspecified convulsions: Secondary | ICD-10-CM

## 2012-10-21 DIAGNOSIS — H547 Unspecified visual loss: Secondary | ICD-10-CM | POA: Insufficient documentation

## 2012-10-21 DIAGNOSIS — I69998 Other sequelae following unspecified cerebrovascular disease: Secondary | ICD-10-CM | POA: Insufficient documentation

## 2012-10-21 DIAGNOSIS — Z7982 Long term (current) use of aspirin: Secondary | ICD-10-CM | POA: Insufficient documentation

## 2012-10-21 DIAGNOSIS — H539 Unspecified visual disturbance: Secondary | ICD-10-CM | POA: Insufficient documentation

## 2012-10-21 DIAGNOSIS — Z860100 Personal history of colon polyps, unspecified: Secondary | ICD-10-CM | POA: Insufficient documentation

## 2012-10-21 DIAGNOSIS — Z79899 Other long term (current) drug therapy: Secondary | ICD-10-CM | POA: Insufficient documentation

## 2012-10-21 DIAGNOSIS — M6281 Muscle weakness (generalized): Secondary | ICD-10-CM | POA: Insufficient documentation

## 2012-10-21 DIAGNOSIS — I10 Essential (primary) hypertension: Secondary | ICD-10-CM | POA: Insufficient documentation

## 2012-10-21 DIAGNOSIS — G40909 Epilepsy, unspecified, not intractable, without status epilepticus: Secondary | ICD-10-CM | POA: Insufficient documentation

## 2012-10-21 DIAGNOSIS — Z8719 Personal history of other diseases of the digestive system: Secondary | ICD-10-CM | POA: Insufficient documentation

## 2012-10-21 LAB — POCT I-STAT, CHEM 8
Creatinine, Ser: 1.4 mg/dL — ABNORMAL HIGH (ref 0.50–1.35)
Glucose, Bld: 105 mg/dL — ABNORMAL HIGH (ref 70–99)
Hemoglobin: 18 g/dL — ABNORMAL HIGH (ref 13.0–17.0)
TCO2: 24 mmol/L (ref 0–100)

## 2012-10-21 MED ORDER — PHENYTOIN SODIUM 50 MG/ML IJ SOLN
1000.0000 mg | Freq: Once | INTRAMUSCULAR | Status: AC
  Administered 2012-10-21: 1000 mg via INTRAVENOUS
  Filled 2012-10-21: qty 20

## 2012-10-21 NOTE — ED Provider Notes (Signed)
History     CSN: [id number]  Arrival date & time 10/21/12  1733   First MD Initiated Contact with Patient 10/21/12 1737      Chief Complaint  Patient presents with  . Seizures    (Consider location/radiation/quality/duration/timing/severity/associated sxs/prior treatment) Patient is a 62 y.o. male presenting with seizures. The history is provided by the patient and the EMS personnel.  Seizures pt had witnessed 3-5 min seziure pta-last seizure was a month ago--takes dilantin and vimpat and has been compliant--no urinary incontinence or tounge biting--ems called and pt post-ictal on arrival and cbg was 86--pt now back at baseline--denies recent illnesses--nothing makes his current sx better or worse and no tx used pta  Past Medical History  Diagnosis Date  . Hypertension   . Allergy     RHINITIS  . Diverticulosis   . Colonic polyp   . 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 Seizures     No past surgical history on file.  Family History  Problem Relation Age of Onset  . Cancer Mother   . Arthritis Mother   . Heart disease Mother   . Prostate cancer Father   . Cancer Sister   . Colon cancer Neg Hx   . Rectal cancer Neg Hx   . Stomach cancer Neg Hx     History  Substance Use Topics  . Smoking status: Never Smoker   . Smokeless tobacco: Never Used  . Alcohol Use: No      Review of Systems  Neurological: Positive for seizures.  All other systems reviewed and are negative.    Allergies  Review of patient's allergies indicates no known allergies.  Home Medications   Current Outpatient Rx  Name  Route  Sig  Dispense  Refill  . amLODipine (NORVASC) 10 MG tablet   Oral   Take 10 mg by mouth daily with breakfast.         . aspirin EC 81 MG tablet   Oral   Take 81 mg by mouth daily with breakfast.         . irbesartan (AVAPRO) 150 MG tablet   Oral   Take 150 mg by mouth daily with breakfast.         . lacosamide (VIMPAT)  50 MG TABS   Oral   Take 50 mg by mouth 2 (two) times daily.         . nebivolol (BYSTOLIC) 10 MG tablet   Oral   Take 10 mg by mouth daily with breakfast.         . phenytoin (DILANTIN) 100 MG ER capsule   Oral   Take 100 mg by mouth as directed. Take 1 capsule twice daily on Tuesday, Thursday, Saturday, & Sunday. Take  1 capsule three times daily on Monday, Wednesday, & Friday.           BP 148/92  Pulse 90  Physical Exam  Nursing note and vitals reviewed. Constitutional: He is oriented to person, place, and time. He appears well-developed and well-nourished.  Non-toxic appearance. No distress.  HENT:  Head: Normocephalic and atraumatic.  Eyes: Conjunctivae, EOM and lids are normal. Pupils are equal, round, and reactive to light.  Neck: Normal range of motion. Neck supple. No tracheal deviation present. No mass present.  Cardiovascular: Normal rate, regular rhythm and normal heart sounds.  Exam reveals no gallop.   No murmur heard. Pulmonary/Chest: Effort normal and breath sounds normal. No stridor. No respiratory distress.  He has no decreased breath sounds. He has no wheezes. He has no rhonchi. He has no rales.  Abdominal: Soft. Normal appearance and bowel sounds are normal. He exhibits no distension. There is no tenderness. There is no rebound and no CVA tenderness.  Musculoskeletal: Normal range of motion. He exhibits no edema and no tenderness.  Neurological: He is alert and oriented to person, place, and time. He has normal strength. No cranial nerve deficit or sensory deficit. GCS eye subscore is 4. GCS verbal subscore is 5. GCS motor subscore is 6.  Baseline left sided weakness  Skin: Skin is warm and dry. No abrasion and no rash noted.  Psychiatric: He has a normal mood and affect. His speech is normal and behavior is normal.    ED Course  Procedures (including critical care time)  Labs Reviewed  PHENYTOIN LEVEL, TOTAL   No results found.   No diagnosis  found.    MDM  Patient given Dilantin and is stable for discharge        Toy Baker, MD 10/21/12 2105

## 2012-10-21 NOTE — ED Notes (Signed)
Pt had witnessed seizure today lasting approx. 3-5 mins. Has had seizures since April 2013 after his stroke. Takes dilantin as directed. Last seizure in January. CBG 89.

## 2012-10-24 DIAGNOSIS — Z0271 Encounter for disability determination: Secondary | ICD-10-CM

## 2012-10-29 ENCOUNTER — Other Ambulatory Visit: Payer: Self-pay | Admitting: Neurology

## 2012-10-29 DIAGNOSIS — R569 Unspecified convulsions: Secondary | ICD-10-CM

## 2012-11-21 ENCOUNTER — Encounter: Payer: Self-pay | Admitting: Family Medicine

## 2012-11-25 ENCOUNTER — Telehealth: Payer: Self-pay | Admitting: *Deleted

## 2012-11-25 NOTE — Telephone Encounter (Signed)
I spoke to Craig [name],patient will pick up form.

## 2012-12-01 ENCOUNTER — Telehealth: Payer: Self-pay

## 2012-12-01 NOTE — Telephone Encounter (Signed)
PT WIFE CALLED AND SAID PT IS COMPLAINING OF RIGHT HAND TINGLING IN FINGERS SHE SAID SHE READ OVER THE STROKE PAPER OF WHAT TO LOOKOUT FOR AND THAT IS WAS NOT A SIGN SO SHE WANTED TO KNOW IF YOU WOULD WANT HIM TO COME IN FOR AN APPOINTMENT PLEASE ADVISE

## 2012-12-01 NOTE — Telephone Encounter (Signed)
Schedule him for an appointment whenever it is convenient

## 2012-12-02 NOTE — Telephone Encounter (Signed)
PT IS COMING IN 12-04-12

## 2012-12-04 ENCOUNTER — Encounter: Payer: Self-pay | Admitting: Family Medicine

## 2012-12-04 ENCOUNTER — Ambulatory Visit (INDEPENDENT_AMBULATORY_CARE_PROVIDER_SITE_OTHER): Payer: BC Managed Care – PPO | Admitting: Family Medicine

## 2012-12-04 VITALS — BP 120/72 | HR 60 | Wt 180.0 lb

## 2012-12-04 DIAGNOSIS — R202 Paresthesia of skin: Secondary | ICD-10-CM

## 2012-12-04 DIAGNOSIS — R209 Unspecified disturbances of skin sensation: Secondary | ICD-10-CM

## 2012-12-04 NOTE — Progress Notes (Signed)
  Subjective:    Patient ID: Craig [name], male    DOB: 13-Jun-1951, 62 y.o.   MRN: [id number]  HPI He complains of 2 episodes of right finger tingling. Both of these episodes occurred while he was sitting and having his wrist flexed. The tingling sensation with her in his thumb and index finger. We straighten his wrist out, the symptoms went away. He has no tingling inhis other hand or feet.   Review of Systems     Objective:   Physical Exam Alert and in no distress. Residual weakness noted on the left side especially his arm. Exam of the right wrist shows no tenderness. Tinel's and Phalen test was negative.       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 I recommended that when he has these feelings, straighten his wrist out. He will call if he has further difficulties.

## 2012-12-17 ENCOUNTER — Encounter (HOSPITAL_COMMUNITY): Payer: Self-pay | Admitting: *Deleted

## 2012-12-17 ENCOUNTER — Emergency Department (HOSPITAL_COMMUNITY)
Admission: EM | Admit: 2012-12-17 | Discharge: 2012-12-17 | Disposition: A | Discharge status: Home Health | Payer: BC Managed Care – PPO | Attending: Emergency Medicine | Admitting: Emergency Medicine

## 2012-12-17 DIAGNOSIS — Z860100 Personal history of colon polyps, unspecified: Secondary | ICD-10-CM | POA: Insufficient documentation

## 2012-12-17 DIAGNOSIS — R569 Unspecified convulsions: Secondary | ICD-10-CM

## 2012-12-17 DIAGNOSIS — Z8673 Personal history of transient ischemic attack (TIA), and cerebral infarction without residual deficits: Secondary | ICD-10-CM | POA: Insufficient documentation

## 2012-12-17 DIAGNOSIS — G40909 Epilepsy, unspecified, not intractable, without status epilepticus: Secondary | ICD-10-CM | POA: Insufficient documentation

## 2012-12-17 DIAGNOSIS — Z7982 Long term (current) use of aspirin: Secondary | ICD-10-CM | POA: Insufficient documentation

## 2012-12-17 DIAGNOSIS — Z8719 Personal history of other diseases of the digestive system: Secondary | ICD-10-CM | POA: Insufficient documentation

## 2012-12-17 DIAGNOSIS — Z79899 Other long term (current) drug therapy: Secondary | ICD-10-CM | POA: Insufficient documentation

## 2012-12-17 DIAGNOSIS — R5383 Other fatigue: Secondary | ICD-10-CM | POA: Insufficient documentation

## 2012-12-17 DIAGNOSIS — R5381 Other malaise: Secondary | ICD-10-CM | POA: Insufficient documentation

## 2012-12-17 DIAGNOSIS — H538 Other visual disturbances: Secondary | ICD-10-CM | POA: Insufficient documentation

## 2012-12-17 DIAGNOSIS — I10 Essential (primary) hypertension: Secondary | ICD-10-CM | POA: Insufficient documentation

## 2012-12-17 LAB — CBC WITH DIFFERENTIAL/PLATELET
Eosinophils Absolute: 0.3 10*3/uL (ref 0.0–0.7)
Eosinophils Relative: 6 % — ABNORMAL HIGH (ref 0–5)
Lymphs Abs: 1 10*3/uL (ref 0.7–4.0)
MCH: 28.2 pg (ref 26.0–34.0)
MCV: 85.8 fL (ref 78.0–100.0)
Monocytes Absolute: 0.7 10*3/uL (ref 0.1–1.0)
Platelets: 220 10*3/uL (ref 150–400)
RBC: 5.7 MIL/uL (ref 4.22–5.81)
RDW: 13.2 % (ref 11.5–15.5)

## 2012-12-17 LAB — BASIC METABOLIC PANEL
Calcium: 9.6 mg/dL (ref 8.4–10.5)
Creatinine, Ser: 1.16 mg/dL (ref 0.50–1.35)
GFR calc non Af Amer: 67 mL/min — ABNORMAL LOW (ref >90)
Glucose, Bld: 108 mg/dL — ABNORMAL HIGH (ref 70–99)
Sodium: 138 mEq/L (ref 135–145)

## 2012-12-17 MED ORDER — SODIUM CHLORIDE 0.9 % IV SOLN
200.0000 mg | Freq: Once | INTRAVENOUS | Status: AC
  Administered 2012-12-17: 200 mg via INTRAVENOUS
  Filled 2012-12-17: qty 20

## 2012-12-17 NOTE — ED Notes (Signed)
ZOX:[id number]<AV> Expected date:<BR> Expected time:<BR> Means of arrival:Ambulance<BR> Comments:<BR> 69yom- witnessed seizure, hx of same

## 2012-12-17 NOTE — ED Provider Notes (Signed)
Care assumed from [place], [place] at shift change. Patient receiving Vipmat IV. Once this is complete, he is ready for discharge. Currently patient resting comfortably on exam bed in NAD. No seizure activity at this time. States he is feeling well. 10:32 PM Patient ready for discharge. Vipmat complete.  Trevor Mace, PA-C 12/17/12 2232

## 2012-12-17 NOTE — ED Provider Notes (Signed)
Medical screening examination/treatment/procedure(s) were performed by non-physician practitioner and as supervising physician I was immediately available for consultation/collaboration.    Nelia Shi, MD 12/17/12 [phone]

## 2012-12-17 NOTE — ED Notes (Signed)
Per EMS pt had a witnessed seizure today, has hx of same. Per EMS pt sts he has an aura before the seizure and he was able to have someone help him to ground prior to today's seizure. No trauma to tongue. Pt is alert and oriented.

## 2012-12-17 NOTE — ED Provider Notes (Signed)
History     CSN: [id number]  Arrival date & time 12/17/12  1741   First MD Initiated Contact with Patient 12/17/12 1745      Chief Complaint  Patient presents with  . Seizures    (Consider location/radiation/quality/duration/timing/severity/associated sxs/prior treatment) HPI Comments: Patient is a 62 year old male with a history of stroke in April 2013 and seizures who presents after having a seizure at home today. Patient states this seizure was preceded by an aura where his head turned to the right and started shaking. He then went to a described tonic clonic seizure lasting for approximately 2-3 minutes. Patient's wife endorses a post ictal phase of less than a minute. Patient states that seizures are the same in onset and duration as the seizures he is experienced in the past. Patient is currently on Dilantin as well as Vimpat for seizures and is followed by Dr. Pearlean Brownie. Patient's last EEG, MRI of the brain and f/u with Dr. Pearlean Brownie was last month. They states that Dr. Pearlean Brownie is attempting to wean the patient off Dilantin and switch him solely to Vimpat for seizure prophylaxis. Patient denies fever, head trauma with seizure, headache, vision changes, biting his tongue or oral bleeding, tinnitus or hearing loss, chest pain, shortness of breath, and numbness or tingling in his extremities. Patient endorses some left-sided weakness and decreased vision in his left eye since his stroke last year; admits to being at baseline today.  Patient is a 62 y.o. male presenting with seizures. The history is provided by the patient. No language interpreter was used.  Seizures   Past Medical History  Diagnosis Date  . Hypertension   . Allergy     RHINITIS  . Diverticulosis   . Colonic polyp   . 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 Seizures     History reviewed. No pertinent past surgical history.  Family History  Problem Relation Age of Onset  . Cancer Mother   .  Arthritis Mother   . Heart disease Mother   . Prostate cancer Father   . Cancer Sister   . Colon cancer Neg Hx   . Rectal cancer Neg Hx   . Stomach cancer Neg Hx     History  Substance Use Topics  . Smoking status: Never Smoker   . Smokeless tobacco: Never Used  . Alcohol Use: No     Review of Systems  Constitutional: Negative for fever and chills.  Eyes: Negative for visual disturbance.  Respiratory: Negative for chest tightness and shortness of breath.   Cardiovascular: Negative for chest pain.  Gastrointestinal: Negative for nausea, vomiting and abdominal pain.  Genitourinary: Negative for dysuria and hematuria.  Skin: Negative for pallor and wound.  Neurological: Positive for seizures. Negative for weakness and headaches.  All other systems reviewed and are negative.    Allergies  Review of patient's allergies indicates no known allergies.  Home Medications   Current Outpatient Rx  Name  Route  Sig  Dispense  Refill  . amLODipine (NORVASC) 10 MG tablet   Oral   Take 10 mg by mouth daily with breakfast.         . aspirin EC 81 MG tablet   Oral   Take 81 mg by mouth daily with breakfast.         . irbesartan (AVAPRO) 150 MG tablet   Oral   Take 150 mg by mouth daily with breakfast.         .  lacosamide (VIMPAT) 50 MG TABS   Oral   Take 50 mg by mouth 2 (two) times daily. 1 AM AND 2 AT DINNER         . nebivolol (BYSTOLIC) 10 MG tablet   Oral   Take 10 mg by mouth daily with breakfast.         . phenytoin (DILANTIN) 100 MG ER capsule   Oral   Take 100 mg by mouth as directed. Take 1 capsule twice daily on Tuesday, Thursday, Saturday, & Sunday. Take  1 capsule three times daily on Monday, Wednesday, & Friday.           BP 175/98  Pulse 88  Temp(Src) 98.5 F (36.9 C)  Resp 20  SpO2 100%  Physical Exam  Nursing note and vitals reviewed. Constitutional: He is oriented to person, place, and time. He appears well-developed and  well-nourished.  HENT:  Head: Normocephalic and atraumatic.  Mouth/Throat: Oropharynx is clear and moist. No oropharyngeal exudate.  Symmetric rise of the uvula with phonation  Eyes: Conjunctivae and EOM are normal. Pupils are equal, round, and reactive to light. Right eye exhibits no discharge. Left eye exhibits no discharge. No scleral icterus.  Neck: Normal range of motion. Neck supple.  Cardiovascular: Normal rate, regular rhythm and normal heart sounds.   Pulmonary/Chest: Effort normal and breath sounds normal. No respiratory distress. He has no wheezes. He has no rales.  Abdominal: Soft. He exhibits no distension. There is no tenderness. There is no rebound and no guarding.  Musculoskeletal: Normal range of motion. He exhibits no edema.  Lymphadenopathy:    He has no cervical adenopathy.  Neurological: He is alert and oriented to person, place, and time.  CN II-XII grossly intact; slight weakness of upper L eyelid strength. Muscle strength decreased in L upper and lower extremities when testing strength against resistance. Patient able to pick up dime off table with R hand and small test tube off table with L hand (Patient's wife states smallest thing patient has been able to pick up with L hand since stroke is a checker) DTRs normal and symmetric. No sensory deficits appreciated.  Skin: Skin is warm and dry. No rash noted. No erythema.  Psychiatric: He has a normal mood and affect. His behavior is normal.    ED Course  Procedures (including critical care time)  Labs Reviewed  PHENYTOIN LEVEL, TOTAL - Abnormal; Notable for the following:    Phenytoin Lvl 4.7 (*)    All other components within normal limits  CBC WITH DIFFERENTIAL - Abnormal; Notable for the following:    Eosinophils Relative 6 (*)    All other components within normal limits  BASIC METABOLIC PANEL - Abnormal; Notable for the following:    Glucose, Bld 108 (*)    GFR calc non Af Amer 67 (*)    GFR calc Af Amer 77  (*)    All other components within normal limits  GLUCOSE, CAPILLARY - Abnormal; Notable for the following:    Glucose-Capillary 102 (*)    All other components within normal limits   No results found.   1. Seizure     MDM  6:00 - Patient is a 61y/o male with a hx of seizures and a stroke in 12/2011 who presents after a seizure while at home today. Patient states seizure was same in onset, quality and duration as his seizures in the past. Patient is followed by Dr. Pearlean Brownie and takes Phenytoin and Vimpat for his seizures.  Patient admits to full compliance with medications. Physical exam significant for L sided weakness, though patient states weakness is at baseline and secondary to his stroke one year ago. W/u to include CBG, CBC, BMP, and phenytoin level. Patient work up and management discussed with Dr. Radford Pax who is in agreement.  8:00 - Phenytoin level subtherapeutic, consistent with previous work ups. Will consult neurology as wife states last time patient was here for a seizure he received 1000mg  phenytoin IV and "when Dr. Pearlean Brownie found out he got really mad and said 'never let them do that again'."  8:20 - Dr. Roseanne Reno from neurology consulted regarding patient tx in ED. Dr. Roseanne Reno advised 200mg  Vimpat dose in ED today and to have patient d/c on 100mg  Vimpat BID. Orders placed. Patient signed out to Johnnette Gourd, PA-C at shift change for dispo.      Antony Madura, PA-C 12/19/12 1116

## 2012-12-17 NOTE — ED Notes (Signed)
Per Pt 's wife pt told by his neurologist he can't have 1000 mg phenytoin under any circumstances.

## 2012-12-19 NOTE — ED Provider Notes (Signed)
Medical screening examination/treatment/procedure(s) were performed by non-physician practitioner and as supervising physician I was immediately available for consultation/collaboration.   Nelia Shi, MD 12/19/12 [phone]

## 2012-12-22 ENCOUNTER — Telehealth: Payer: Self-pay | Admitting: Family Medicine

## 2012-12-22 NOTE — Telephone Encounter (Signed)
That medication is controlling his blood pressure and has not causing issues with seizures.

## 2012-12-22 NOTE — Telephone Encounter (Signed)
Wife is very concerned that pharmacist stated that b/p med could be working against seizure  med and causing dilantin level to be low something is not working well

## 2012-12-23 ENCOUNTER — Telehealth: Payer: Self-pay | Admitting: *Deleted

## 2012-12-23 DIAGNOSIS — G40309 Generalized idiopathic epilepsy and epileptic syndromes, not intractable, without status epilepticus: Secondary | ICD-10-CM

## 2012-12-23 MED ORDER — LACOSAMIDE 200 MG PO TABS: 200.0000 mg | ORAL_TABLET | Freq: Two times a day (BID) | ORAL | Status: DC

## 2012-12-23 NOTE — Telephone Encounter (Signed)
Craig [name] was informed word for word and said she would do that

## 2012-12-23 NOTE — Telephone Encounter (Signed)
His wife called concerned about his blood pressure medicine and seizures. I checked on this and the ARB can cause an elevated Dilantin level and in this regard, his Dilantin level was low. Encouraged her to call neurology and have his Dilantin level readjusted

## 2012-12-23 NOTE — Telephone Encounter (Signed)
Spoke to wife advised increase vimpat to 200 mg twice daily.

## 2012-12-23 NOTE — Telephone Encounter (Signed)
Message copied by Salome Spotted on Tue Dec 23, 2012  5:20 PM ------      Message from: Eugenie Birks      Created: Tue Dec 23, 2012  1:19 PM      Contact: wife       Britta Mccreedy calling has a question about interaction with seizure and BP medication. Pt had another seizure and his dilantin level was back down 4.2 again. Please call. ------

## 2012-12-23 NOTE — Telephone Encounter (Signed)
DID YOU SEE THIS

## 2012-12-26 ENCOUNTER — Telehealth: Payer: Self-pay | Admitting: *Deleted

## 2012-12-26 NOTE — Telephone Encounter (Signed)
Message copied by Monico Blitz on Fri Dec 26, 2012  9:19 AM ------      Message from: Eugenie Birks      Created: Fri Dec 26, 2012  8:02 AM      Contact: wife       Wife called wants to know if vimpat is also a form of dilantin? Please call her back. ------

## 2012-12-26 NOTE — Telephone Encounter (Signed)
Wife called wants to know if vimpat is also a form of dilantin? Please call her back.

## 2012-12-26 NOTE — Telephone Encounter (Signed)
This message was forwarded to me.  I called back, got no answer.  Left message saying the medications are not the same, but are used to treat the same condition.  Asked them to call us back if needed.  If they call back and require more detailed info, it would be best that they speak to a nurse or the provider.

## 2013-01-09 ENCOUNTER — Telehealth: Payer: Self-pay | Admitting: Neurology

## 2013-01-09 NOTE — Telephone Encounter (Signed)
Patient's wife called. She stated that when patient was in ER on April 16 he was instructed to increase Zimpat to two in a.m. And Dr. Pearlean Brownie also said to take two in p.m. That is what patient has been doing. Over the past two days the patient has been feeling "swimmy headed". Wife is wondering if dilantin level needs to be checked. Please review medication orders and call wife, Gardner Servantes, with instructions. [phone] Thank you. Su Ley BS RN

## 2013-01-14 NOTE — Telephone Encounter (Signed)
No needto check dialantin levels. Agree vimpat dose is 200 mg twice daily.  Craig [name] Pl call wife and check on patient

## 2013-01-15 ENCOUNTER — Telehealth: Payer: Self-pay | Admitting: Neurology

## 2013-01-15 NOTE — Telephone Encounter (Signed)
Called to wife to see how patient is doing. She said he was still "swimmy headed" so she cut the Vimpac back to one BID. She is also giving the medication about a half hour after meals, rather than with a meal. He is doing better now. She wants to confirm that this is okay. Su Ley BS RN

## 2013-01-21 NOTE — Telephone Encounter (Signed)
I spoke with wife and told her Dr. Pearlean Brownie said it was ok to give Vimpat as she has been giving.  Wife stated that patient is not having the "woozy feelings" since she changed how he is taking it.

## 2013-02-03 ENCOUNTER — Ambulatory Visit: Payer: Self-pay | Admitting: Nurse Practitioner

## 2013-02-18 ENCOUNTER — Telehealth: Payer: Self-pay | Admitting: Internal Medicine

## 2013-02-18 MED ORDER — AMLODIPINE BESYLATE 10 MG PO TABS: 10.0000 mg | ORAL_TABLET | Freq: Every day | ORAL | Status: DC

## 2013-02-18 NOTE — Telephone Encounter (Signed)
Refill request for amlodipine 10mg  to [facility] [address]

## 2013-02-18 NOTE — Telephone Encounter (Signed)
Done

## 2013-02-22 ENCOUNTER — Emergency Department (HOSPITAL_COMMUNITY)
Admission: EM | Admit: 2013-02-22 | Discharge: 2013-02-22 | Disposition: A | Discharge status: Home / Self Care | Payer: BC Managed Care – PPO | Attending: Emergency Medicine | Admitting: Emergency Medicine

## 2013-02-22 ENCOUNTER — Encounter (HOSPITAL_COMMUNITY): Payer: Self-pay | Admitting: *Deleted

## 2013-02-22 DIAGNOSIS — Z8719 Personal history of other diseases of the digestive system: Secondary | ICD-10-CM | POA: Insufficient documentation

## 2013-02-22 DIAGNOSIS — Z860100 Personal history of colon polyps, unspecified: Secondary | ICD-10-CM | POA: Insufficient documentation

## 2013-02-22 DIAGNOSIS — Z8673 Personal history of transient ischemic attack (TIA), and cerebral infarction without residual deficits: Secondary | ICD-10-CM | POA: Insufficient documentation

## 2013-02-22 DIAGNOSIS — Z7982 Long term (current) use of aspirin: Secondary | ICD-10-CM | POA: Insufficient documentation

## 2013-02-22 DIAGNOSIS — Z8709 Personal history of other diseases of the respiratory system: Secondary | ICD-10-CM | POA: Insufficient documentation

## 2013-02-22 DIAGNOSIS — G40909 Epilepsy, unspecified, not intractable, without status epilepticus: Secondary | ICD-10-CM | POA: Insufficient documentation

## 2013-02-22 DIAGNOSIS — R569 Unspecified convulsions: Secondary | ICD-10-CM

## 2013-02-22 DIAGNOSIS — Z79899 Other long term (current) drug therapy: Secondary | ICD-10-CM | POA: Insufficient documentation

## 2013-02-22 DIAGNOSIS — I10 Essential (primary) hypertension: Secondary | ICD-10-CM | POA: Insufficient documentation

## 2013-02-22 LAB — DIFFERENTIAL
Basophils Absolute: 0 10*3/uL (ref 0.0–0.1)
Eosinophils Absolute: 0.2 10*3/uL (ref 0.0–0.7)
Eosinophils Relative: 2 % (ref 0–5)
Monocytes Absolute: 1.3 10*3/uL — ABNORMAL HIGH (ref 0.1–1.0)

## 2013-02-22 LAB — BASIC METABOLIC PANEL
BUN: 11 mg/dL (ref 6–23)
Calcium: 9.6 mg/dL (ref 8.4–10.5)
Creatinine, Ser: 1.2 mg/dL (ref 0.50–1.35)
GFR calc non Af Amer: 64 mL/min — ABNORMAL LOW (ref >90)
Glucose, Bld: 90 mg/dL (ref 70–99)
Sodium: 137 mEq/L (ref 135–145)

## 2013-02-22 LAB — CBC
HCT: 52.2 % — ABNORMAL HIGH (ref 39.0–52.0)
MCH: 27.9 pg (ref 26.0–34.0)
MCHC: 33.3 g/dL (ref 30.0–36.0)
MCV: 83.7 fL (ref 78.0–100.0)
Platelets: 216 10*3/uL (ref 150–400)
RDW: 13.3 % (ref 11.5–15.5)

## 2013-02-22 MED ORDER — SODIUM CHLORIDE 0.9 % IV BOLUS (SEPSIS)
1000.0000 mL | Freq: Once | INTRAVENOUS | Status: AC
  Administered 2013-02-22: 1000 mL via INTRAVENOUS

## 2013-02-22 MED ORDER — LABETALOL HCL 5 MG/ML IV SOLN
10.0000 mg | Freq: Once | INTRAVENOUS | Status: DC
  Filled 2013-02-22: qty 4

## 2013-02-22 MED ORDER — SODIUM CHLORIDE 0.9 % IV SOLN
500.0000 mg | Freq: Once | INTRAVENOUS | Status: AC
  Administered 2013-02-22: 500 mg via INTRAVENOUS
  Filled 2013-02-22: qty 10

## 2013-02-22 NOTE — ED Notes (Signed)
Per ems: pt from home, wife witnessed pt having seizure which lasted 5 minutes. Wife reports pt has seizures every 2 months. Pt a+o, no injury to tongue noted. ems reports incontinence of urine. Wife reports pts dilantin level is low. bp 153/99, pulse 88, respirations 16, cbg 138

## 2013-02-22 NOTE — ED Notes (Signed)
YNW:GNFA<OZ> Expected date:02/22/13<BR> Expected time:<BR> Means of arrival:<BR> Comments:<BR> Seizure

## 2013-02-22 NOTE — ED Provider Notes (Signed)
History     CSN: [id number]  Arrival date & time 02/22/13  1427   First MD Initiated Contact with Patient 02/22/13 1502      Chief Complaint  Patient presents with  . Seizures    (Consider location/radiation/quality/duration/timing/severity/associated sxs/prior treatment) HPI Comments: Patient is a 62 year old male with a past medical history of seizures who presents with a witnessed seizure that occurred prior to arrival. Patient's wife witnessed a seizure that lasted about 5 minutes. She reports generalized jerking movements. She denies head trauma but does report the patient bit his tongue. No aggravating/allevaiting factors. No associated symptoms. Patient takes Dilantin and Vimpat for seizures. His neurologist is Dr. Lurline Idol.    Past Medical History  Diagnosis Date  . Hypertension   . Allergy     RHINITIS  . Diverticulosis   . Colonic polyp   . 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 Seizures     History reviewed. No pertinent past surgical history.  Family History  Problem Relation Age of Onset  . Cancer Mother   . Arthritis Mother   . Heart disease Mother   . Prostate cancer Father   . Cancer Sister   . Colon cancer Neg Hx   . Rectal cancer Neg Hx   . Stomach cancer Neg Hx     History  Substance Use Topics  . Smoking status: Never Smoker   . Smokeless tobacco: Never Used  . Alcohol Use: No      Review of Systems  Neurological: Positive for seizures.  All other systems reviewed and are negative.    Allergies  Review of patient's allergies indicates no known allergies.  Home Medications   Current Outpatient Rx  Name  Route  Sig  Dispense  Refill  . amLODipine (NORVASC) 10 MG tablet   Oral   Take 1 tablet (10 mg total) by mouth daily with breakfast.   30 tablet   2   . aspirin EC 81 MG tablet   Oral   Take 81 mg by mouth daily with breakfast.         . irbesartan (AVAPRO) 150 MG tablet   Oral   Take 150 mg by  mouth daily with breakfast.         . lacosamide (VIMPAT) 200 MG TABS   Oral   Take 1 tablet (200 mg total) by mouth 2 (two) times daily.   60 tablet   3   . nebivolol (BYSTOLIC) 10 MG tablet   Oral   Take 10 mg by mouth daily with breakfast.         . phenytoin (DILANTIN) 100 MG ER capsule   Oral   Take 100 mg by mouth as directed. Take 1 capsule twice daily on Tuesday, Thursday, Saturday, & Sunday. Take  1 capsule three times daily on Monday, Wednesday, & Friday.           BP 147/81  Pulse 86  Temp(Src) 97.5 F (36.4 C) (Oral)  Resp 20  SpO2 100%  Physical Exam  Nursing note and vitals reviewed. Constitutional: He is oriented to person, place, and time. He appears well-developed and well-nourished. No distress.  HENT:  Head: Normocephalic and atraumatic.  Mouth/Throat: Oropharynx is clear and moist. No oropharyngeal exudate.  Eyes: Conjunctivae and EOM are normal. Pupils are equal, round, and reactive to light. No scleral icterus.  Neck: Normal range of motion.  Cardiovascular: Normal rate and regular rhythm.  Exam reveals  no gallop and no friction rub.   No murmur heard. Pulmonary/Chest: Effort normal and breath sounds normal. He has no wheezes. He has no rales. He exhibits no tenderness.  Abdominal: Soft. He exhibits no distension. There is no tenderness. There is no rebound and no guarding.  Musculoskeletal: Normal range of motion.  Neurological: He is alert and oriented to person, place, and time. No cranial nerve deficit. Coordination normal.  Speech is goal-oriented. Moves limbs without ataxia.   Skin: Skin is warm and dry.  Psychiatric: He has a normal mood and affect. His behavior is normal.    ED Course  Procedures (including critical care time)  Labs Reviewed  BASIC METABOLIC PANEL - Abnormal; Notable for the following:    GFR calc non Af Amer 64 (*)    GFR calc Af Amer 74 (*)    All other components within normal limits  PHENYTOIN LEVEL, TOTAL -  Abnormal; Notable for the following:    Phenytoin Lvl 6.7 (*)    All other components within normal limits  CBC - Abnormal; Notable for the following:    WBC 11.6 (*)    RBC 6.24 (*)    Hemoglobin 17.4 (*)    HCT 52.2 (*)    All other components within normal limits  DIFFERENTIAL - Abnormal; Notable for the following:    Neutro Abs 8.9 (*)    Lymphocytes Relative 10 (*)    Monocytes Absolute 1.3 (*)    All other components within normal limits  CBC WITH DIFFERENTIAL   No results found.   1. Seizure       MDM  3:07 PM Labs pending. Patient doing well without complaints. Vitals stable and patient afebrile.   7:29 PM Labs unremarkable for acute changes. Patient will have 500mg  IV dilantin due to low dilantin level. Vitals stable and patient afebrile. Patient will have recommended Neuro follow up and instructions to return to the ED with worsening or concerning symptoms.       Emilia Beck, PA-C 02/23/13 1621

## 2013-02-22 NOTE — ED Notes (Signed)
Phlebotomist called, will come to ED to draw pt's labs

## 2013-02-23 ENCOUNTER — Telehealth: Payer: Self-pay | Admitting: Internal Medicine

## 2013-02-23 ENCOUNTER — Telehealth: Payer: Self-pay

## 2013-02-23 NOTE — Telephone Encounter (Signed)
Pt wife states that he was in the emergency room last night for a seizure and his blood kept clotting so they were unable to get enough blood for labs. Britta Mccreedy wants to know what needs to be done about his blood clotting

## 2013-02-23 NOTE — Telephone Encounter (Signed)
Message copied by [facility] on Mon Feb 23, 2013  6:18 PM ------      Message from: [facility], [place]      Created: Mon Feb 23, 2013 10:37 AM      Contact: wife/Barbara       Wife is calling regarding husbands blood is clotting and she is concerned and she wants to speak with a nurse or have him seen.  I made an appt for September and she states that is not acceptable.  Please call and advise. ------

## 2013-02-23 NOTE — Telephone Encounter (Signed)
When patient turns head to left or right he seems to have seizures. Maybe muscle spasms but, wife is very concerned as it happens each time he turns his head. Wife understands that PAs and NPs are assigned patients but, she is asking that Dr. Pearlean Brownie please fit this patient in so he can be assessed and stabilized as soon as possible.

## 2013-02-23 NOTE — Telephone Encounter (Signed)
Also pt is suppose to go to the dentist on Wednesday and possibly have a tooth pulled or cut out and since his blood is clotting does she need to cancel his appt and reschedule for another day

## 2013-02-24 NOTE — Telephone Encounter (Signed)
Called patient wife she stated she her husband was in the hospital before for this problem and they stated it may be muscle  spasmes in the neck. She may call back and cancel appointment .

## 2013-02-24 NOTE — Telephone Encounter (Signed)
When patient turns head to left or right he seems to have seizures. Maybe muscle spasms but, wife is very concerned as it happens each time he turns his head. Wife understands that PAs and NPs are assigned patients but, she is asking that Dr. Sethi please fit this patient in so he can be assessed and stabilized as soon as possible.  

## 2013-02-25 NOTE — ED Provider Notes (Signed)
Medical screening examination/treatment/procedure(s) were performed by non-physician practitioner and as supervising physician I was immediately available for consultation/collaboration.     Pernell Dikes J. Hanaa Payes, MD 02/25/13 1455 

## 2013-02-26 ENCOUNTER — Ambulatory Visit (INDEPENDENT_AMBULATORY_CARE_PROVIDER_SITE_OTHER): Payer: BC Managed Care – PPO | Admitting: Family Medicine

## 2013-02-26 ENCOUNTER — Encounter: Payer: Self-pay | Admitting: Family Medicine

## 2013-02-26 VITALS — BP 140/70 | HR 64

## 2013-02-26 DIAGNOSIS — G40209 Localization-related (focal) (partial) symptomatic epilepsy and epileptic syndromes with complex partial seizures, not intractable, without status epilepticus: Secondary | ICD-10-CM

## 2013-02-26 DIAGNOSIS — I639 Cerebral infarction, unspecified: Secondary | ICD-10-CM

## 2013-02-26 DIAGNOSIS — I6350 Cerebral infarction due to unspecified occlusion or stenosis of unspecified cerebral artery: Secondary | ICD-10-CM

## 2013-02-26 NOTE — Progress Notes (Signed)
  Subjective:    Patient ID: Craig [name], male    DOB: 1950-12-07, 62 y.o.   MRN: [id number]  HPI He is here for followup visit. He had another seizure and was seen in the emergency room on June 22. They have an appointment to see neurology at in several weeks. He now give a history that several of the seizures had occurred after he has moved his head usually to the right. He also notes when he gazes upwards he does become more dizzy.   Review of Systems     Objective:   Physical Exam Alert and in no distress with slightly slurred speech. No carotid bruits noted. Cardiac exam is unchanged.       Assessment & Plan:  Complex partial epileptic seizure - Plan: US Carotid Duplex Bilateral  CVA (cerebral infarction) - Plan: US Carotid Duplex Bilateral

## 2013-02-27 ENCOUNTER — Ambulatory Visit (HOSPITAL_COMMUNITY)
Admission: RE | Admit: 2013-02-27 | Discharge: 2013-02-27 | Disposition: A | Discharge status: Home / Self Care | Payer: BC Managed Care – PPO | Source: Ambulatory Visit | Attending: Family Medicine | Admitting: Family Medicine

## 2013-02-27 ENCOUNTER — Other Ambulatory Visit (HOSPITAL_COMMUNITY): Payer: Self-pay | Admitting: Family Medicine

## 2013-02-27 DIAGNOSIS — R42 Dizziness and giddiness: Secondary | ICD-10-CM

## 2013-02-27 DIAGNOSIS — R569 Unspecified convulsions: Secondary | ICD-10-CM

## 2013-02-27 DIAGNOSIS — G40209 Localization-related (focal) (partial) symptomatic epilepsy and epileptic syndromes with complex partial seizures, not intractable, without status epilepticus: Secondary | ICD-10-CM

## 2013-02-27 DIAGNOSIS — Z8673 Personal history of transient ischemic attack (TIA), and cerebral infarction without residual deficits: Secondary | ICD-10-CM | POA: Insufficient documentation

## 2013-02-27 DIAGNOSIS — I639 Cerebral infarction, unspecified: Secondary | ICD-10-CM

## 2013-02-27 DIAGNOSIS — I658 Occlusion and stenosis of other precerebral arteries: Secondary | ICD-10-CM | POA: Insufficient documentation

## 2013-02-27 DIAGNOSIS — I6529 Occlusion and stenosis of unspecified carotid artery: Secondary | ICD-10-CM | POA: Insufficient documentation

## 2013-02-27 NOTE — Progress Notes (Signed)
VASCULAR LAB PRELIMINARY  PRELIMINARY  PRELIMINARY  PRELIMINARY  Carotid duplex  completed.    Preliminary report:  Bilateral:  Less than 39% ICA stenosis.  Vertebral artery flow is antegrade.  Left vertebral waveform is pulsatile with no diastolic component.    Tasfia Vasseur, RVT 02/27/2013, 9:47 AM

## 2013-03-02 NOTE — Progress Notes (Signed)
Quick Note:  PT INFORMED AND AWARE OF RESULTS AND VERBALIZED UNDERSTANDING ______

## 2013-03-04 NOTE — Telephone Encounter (Signed)
ok 

## 2013-03-16 NOTE — Telephone Encounter (Signed)
This encounter was created in error - please disregard.

## 2013-03-17 ENCOUNTER — Other Ambulatory Visit: Payer: Self-pay

## 2013-03-20 MED ORDER — LACOSAMIDE 100 MG PO TABS: ORAL_TABLET | ORAL | Status: DC

## 2013-03-24 ENCOUNTER — Encounter: Payer: Self-pay | Admitting: Neurology

## 2013-03-24 ENCOUNTER — Ambulatory Visit (INDEPENDENT_AMBULATORY_CARE_PROVIDER_SITE_OTHER): Payer: BC Managed Care – PPO | Admitting: Nurse Practitioner

## 2013-03-24 ENCOUNTER — Encounter: Payer: Self-pay | Admitting: Nurse Practitioner

## 2013-03-24 VITALS — BP 156/94 | HR 94 | Temp 98.5°F | Ht 70.5 in | Wt 185.0 lb

## 2013-03-24 DIAGNOSIS — G40309 Generalized idiopathic epilepsy and epileptic syndromes, not intractable, without status epilepticus: Secondary | ICD-10-CM

## 2013-03-24 DIAGNOSIS — R413 Other amnesia: Secondary | ICD-10-CM

## 2013-03-24 DIAGNOSIS — I6350 Cerebral infarction due to unspecified occlusion or stenosis of unspecified cerebral artery: Secondary | ICD-10-CM

## 2013-03-24 DIAGNOSIS — I639 Cerebral infarction, unspecified: Secondary | ICD-10-CM

## 2013-03-24 MED ORDER — DIVALPROEX SODIUM 500 MG PO DR TAB: 500.0000 mg | DELAYED_RELEASE_TABLET | Freq: Two times a day (BID) | ORAL | Status: DC

## 2013-03-24 MED ORDER — LACOSAMIDE 100 MG PO TABS: ORAL_TABLET | ORAL | Status: DC

## 2013-03-24 NOTE — Progress Notes (Signed)
[facility]  PATIENT: Craig [name] DOB: Sep 03, 1951   HISTORY FROM: patient, wife, chart REASON FOR VISIT: follow up seizures   HISTORICAL  CHIEF COMPLAINT:  Chief Complaint  Patient presents with  . Follow-up    seizures  . Seizures    HISTORY OF PRESENT ILLNESS: UPDATE 03/24/13 (LL): Since last office visit, patient had a witnessed seizure 02/22/13 that occurred prior to arrival to ER. Patient's wife witnessed a seizure that lasted about 5 minutes. She reports generalized jerking movements. She denies head trauma but does report the patient bit his tongue. No aggravating/allevaiting factors. No associated symptoms.  He had another similar seizure on 12/17/12. He is currently taking Dilantin extended release 100 mg, one twice daily on Tuesday, Thursday, Saturday and Sunday. One capsule 3 times daily on Monday, Wednesday, and Friday. Also Vimpat 100 mg tablets, one in the morning, and 2 at night. Wife reports that his memory has been getting much worse over the course of the year.  Sometimes now he even forgets that he has eaten 30 minutes after finishing. He looks to his wife to answer for him when questioned about time, date, location. MMSE at last visit was 24/30 with deficits in orientation and, recall, handwriting.  TSH, homocystine, vitamin B12, RPR were all checked at last visit and normal.  UPDATE 10/29/12 (PS): He is seen urgently today his family requested after having a seizure on 09/05/2012. He was seen at [facility] ER Dilantin level was low and he was given 1000 mg loading dose. He was given antibiotics for UTI. He called the office complaining of what he felt for seizure auras and was asked increased impact 300 mg daily from 10/21/2012 he has been having memory difficulty since then. He denies depression but tested positive on depression screen and score 28/30 on MMSE today. He is not have Dilantin level checked again. He denies dizziness, vertigo, sleepiness or  gait ataxia.  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 Vascular risk factors include hypertension, elevated hemoglobin A1c. He presented to [facility] after being found at home by family on the floor. He had awakened with dizziness and nausea, when going to the bathroom lost his balance and fell. He had numbness and weakness for 2 weeks prior and vision changes. MRA showed diffuse intracranial atherosclerotic disease. 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 Occlusion of the left superior cerebellar artery compatible with areas of acute infarction. 2 Vicodin carotid Doppler normal. LDL 75, hemoglobin A1c 6.4. He is now on aspirin 81 mg stopped clopidogrel recently. He continues with outpatient speech, occupational and physical therapy, doing well. Using a 3 prong cane at times and walker.  REVIEW OF SYSTEMS: Full 14 system review of systems performed and notable only for:  Constitutional: N/A  Cardiovascular: N/A  Ear/Nose/Throat: N/A  Skin: N/A  Eyes: N/A  Respiratory: N/A  Gastroitestinal: N/A  Hematology/Lymphatic: feeling hot  Endocrine: N/A Musculoskeletal:N/A  Allergy/Immunology: N/A  Neurological: memory loss, dizziness, seizure Psychiatric: disinterest in activities   ALLERGIES: No Known Allergies  HOME MEDICATIONS: Outpatient Prescriptions Prior to Visit  Medication Sig Dispense Refill  . amLODipine (NORVASC) 10 MG tablet Take 1 tablet (10 mg total) by mouth daily with breakfast.  30 tablet  2  .  aspirin EC 81 MG tablet Take 81 mg by mouth daily with breakfast.      . irbesartan (AVAPRO) 150 MG tablet Take 150 mg by mouth daily with breakfast.      .  nebivolol (BYSTOLIC) 10 MG tablet Take 10 mg by mouth daily with breakfast.      . phenytoin (DILANTIN) 100 MG ER capsule Take 100 mg by mouth as directed. Take 1 capsule twice daily on Tuesday, Thursday, Saturday, & Sunday. Take  1 capsule three times daily on Monday, Wednesday, & Friday.      . Lacosamide (VIMPAT) 100 MG TABS PT TAKES 1 TABLET EVERY MORNING AND 2 TABLETS AT BEDTIME  90 tablet  5   No facility-administered medications prior to visit.    PAST MEDICAL HISTORY: Past Medical History  Diagnosis Date  . Hypertension   . Allergy     RHINITIS  . Diverticulosis   . Colonic polyp   . 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 Seizures     PAST SURGICAL HISTORY: History reviewed. No pertinent past surgical history.  FAMILY HISTORY: Family History  Problem Relation Age of Onset  . Cancer Mother   . Arthritis Mother   . Heart disease Mother   . Prostate cancer Father   . Cancer Sister   . Colon cancer Neg Hx   . Rectal cancer Neg Hx   . Stomach cancer Neg Hx     SOCIAL HISTORY: History   Social History  . Marital Status: Married    Spouse Name: N/A    Number of Children: N/A  . Years of Education: N/A   Occupational History  . Not on file.   Social History Main Topics  . Smoking status: Never Smoker   . Smokeless tobacco: Never Used  . Alcohol Use: No  . Drug Use: No  . Sexually Active: Yes   Other Topics Concern  . Not on file   Social History Narrative   Patient lives at home with his wife  has 2 years of college and 1 child.    Patient works at  [employer]  Patient denies smoking tobacco,drinking alcohol and taking all illict drugs.    Patient is a caffeine drinker.     PHYSICAL EXAM  Filed Vitals:   03/24/13 1454  BP: 156/94  Pulse: 94  Temp: 98.5 F (36.9 C)  TempSrc: Oral  Height: 5' 10.5" (1.791 m)  Weight: 185 lb (83.915 kg)   Body mass index is 26.16 kg/(m^2).  Generalized: In no acute distress, pleasant  African American male.  Neck: Supple, no carotid bruits   Cardiac: Regular rate rhythm, no murmur   Pulmonary: Clear to auscultation bilaterally   Musculoskeletal: Left foot drop   Neurological examination   Mentation: Alert oriented to time, place, history taking, language fluent, and causual conversation  Cranial nerve II-XII: Pupils were equal round reactive to light extraocular movements were full, visual fields show partial left field cut to confrontational testing. facial sensation and strength were normal. hearing was intact to finger rubbing bilaterally. Uvula tongue midline. head turning and shoulder shrug and were normal and symmetric.Tongue protrusion into cheek strength was normal. MOTOR: Reveals no upper or lower extremity drift. Symmetric and equal strength in all 4 extremities except 4/5 left upper extremity, hip flexor, left ankle. No focal weakness. Diminished fine motor to left hand. Right hand orbits left hand. SENSORY: Touch and pinprick sensations are normal except diminished sensation on left  upper extremity and mild on left lower stomach. COORDINATION: Normal except for mild dysmetria to left finger to finger and finger to nose. REFLEXES: Deep tendon reflexes are 2+ symmetric except more brisk on left upper and lower extremity. GAIT/STATION: Steady gait with left foot dragging slightly improved with 3-prong cane, unable to do tandem walking  DIAGNOSTIC DATA (LABS, IMAGING, TESTING) - I reviewed patient records, labs, notes, testing and imaging myself where available.  Lab Results  Component Value Date   WBC 11.6* 02/22/2013   HGB 17.4* 02/22/2013   HCT 52.2* 02/22/2013   MCV 83.7 02/22/2013   PLT 216 02/22/2013      Component Value Date/Time   NA 137 02/22/2013 1503   K 3.8 02/22/2013 1503   CL 103 02/22/2013 1503   CO2 20 02/22/2013 1503   GLUCOSE 90 02/22/2013 1503   BUN 11 02/22/2013 1503   CREATININE 1.20 02/22/2013 1503   CREATININE 1.37* 05/15/2011 1154    CALCIUM 9.6 02/22/2013 1503   PROT 7.6 02/19/2012 1325   ALBUMIN 3.8 02/19/2012 1325   AST 16 02/19/2012 1325   ALT 16 02/19/2012 1325   ALKPHOS 63 02/19/2012 1325   BILITOT 0.4 02/19/2012 1325   GFRNONAA 64* 02/22/2013 1503   GFRAA 74* 02/22/2013 1503   Lab Results  Component Value Date   CHOL 139 12/07/2011   HDL 52 12/07/2011   LDLCALC 75 12/07/2011   TRIG 59 12/07/2011   CHOLHDL 2.7 12/07/2011   Lab Results  Component Value Date   HGBA1C 6.4* 12/07/2011   No results found for this basename: [id number]   Lab Results  Component Value Date   TSH 0.292* 12/07/2011   12/07/2011 MRI HEAD WITHOUT CONTRAST  Acute right posterior cerebral artery infarct. Acute left superior cerebellar infarct. Extensive chronic ischemic changes. Scattered areas of chronic micro hemorrhage. These findings suggest chronic severe hypertension.  12/07/2011 MRA HEAD WITHOUT CONTRAST  Diffuse intracranial atherosclerotic disease. 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 Occlusion of the left superior cerebellar artery compatible with areas of acute infarction. Carotid duplex 02/27/2013 Preliminary report: Bilateral: Less than 39% ICA stenosis. Vertebral artery flow is antegrade. Left vertebral waveform is pulsatile with no diastolic component.  11/05/12 MRI brain with and without contrast:  This MRI scan of the brain shows old right posterior cerebral, left superior cerebellar and right pontine and left cerebellar lacunar infarcts of remote age. There are moderate changes of chronic microvascular disease and severe changes of chronic paranasal sinus inflammation and bilateral maxillary antrum mucous retention cyst/polyps. No enhancing lesions are noted.  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 Vascular risk factors include hypertension, elevated hemoglobin A1c. Also with 1 episode of involuntary head movement with incontinence and tongue biting likely generalized seizure secondary to cerebral infarct. Had another seizure on 07/16/2012, and another breakthrough seizure on 09/04/2012 in setting of UTI. Additional seizures on 12/17/2012 and 02/23/2013. New complaints of memory loss since the seizures, memory panel labs were normal. Increased Vimpat may be contributing to memory loss vs. Silent seizures. MRI scan of the brain showed no new stroke.   PLAN: Continue ASA 81 mg daily for secondary stroke prevention. Maintain strict blood pressure control cold blood pressure below 130/80. Decrease Vimpat 100 mg a.m. and 100  mg at bedtime. Continue Dilantin in the current dosage. Add Depakote 500 mg twice daily for increased breakthrough seizures. Order EEG to evaluate for silent seizures. I had long discussion with the patient and wife regarding his seizures, strokes, memory problems and answered questions. Followup in 2 months.  Orders Placed This Encounter  Procedures  . EEG adult    Meds ordered this encounter  Medications  . divalproex (DEPAKOTE) 500 MG DR tablet    Sig: Take 1 tablet (500 mg total) by mouth 2 (two) times daily.    Dispense:  60 tablet    Refill:  11    Order Specific Question:  Supervising Provider    Answer:  Micki Riley [[id number]]  . Lacosamide (VIMPAT) 100 MG TABS    Sig: PT TAKES 1 TABLET EVERY MORNING AND 1 TABLET AT BEDTIME    Dispense:  60 tablet    Refill:  5    Pharmacy Fax [phone]    Order Specific Question:  Supervising Provider    Answer:  Dallie Piles    Ollie Esty NP-C 03/24/2013, 5:57 PM  [facility] [address] [place], [place] [address] [phone]

## 2013-03-24 NOTE — Patient Instructions (Addendum)
We will order EEG to look for silent seizures.  We will decrease Vimpat to 1 tablet in the am, 1 tablet at night. (200 mg total daily).  Start Depakote 500 mg 1 tablet, once in the morning, 1 tablet at night.  Follow up in 2 months with Dr. Pearlean Brownie.

## 2013-04-03 ENCOUNTER — Other Ambulatory Visit (INDEPENDENT_AMBULATORY_CARE_PROVIDER_SITE_OTHER): Payer: BC Managed Care – PPO | Admitting: Radiology

## 2013-04-03 DIAGNOSIS — G40309 Generalized idiopathic epilepsy and epileptic syndromes, not intractable, without status epilepticus: Secondary | ICD-10-CM

## 2013-04-06 ENCOUNTER — Encounter: Payer: Self-pay | Admitting: Internal Medicine

## 2013-04-06 ENCOUNTER — Telehealth: Payer: Self-pay | Admitting: Family Medicine

## 2013-04-06 NOTE — Telephone Encounter (Signed)
GAVE TO SUBRINA TO WRITE A NOTE

## 2013-04-06 NOTE — Telephone Encounter (Signed)
He has had recent seizure activity which keeps him from driving for at least the next 6 months. He has to be seizure-free for at least 6 months. With his underlying neurologic deficits I cannot recommend that he drive at all

## 2013-04-13 NOTE — Progress Notes (Signed)
Quick Note:  Spoke to wife and relayed normal EEG result, per Heide Guile. ______

## 2013-04-16 ENCOUNTER — Other Ambulatory Visit: Payer: Self-pay | Admitting: Family Medicine

## 2013-05-04 IMAGING — CR DG CHEST 2V
2 series · 2 of 2 positions shown · non-contrast
Comparison: None.

CLINICAL DATA: Weakness in the left arm.  Stroke.

CHEST - 2 VIEW

[w chest lat]
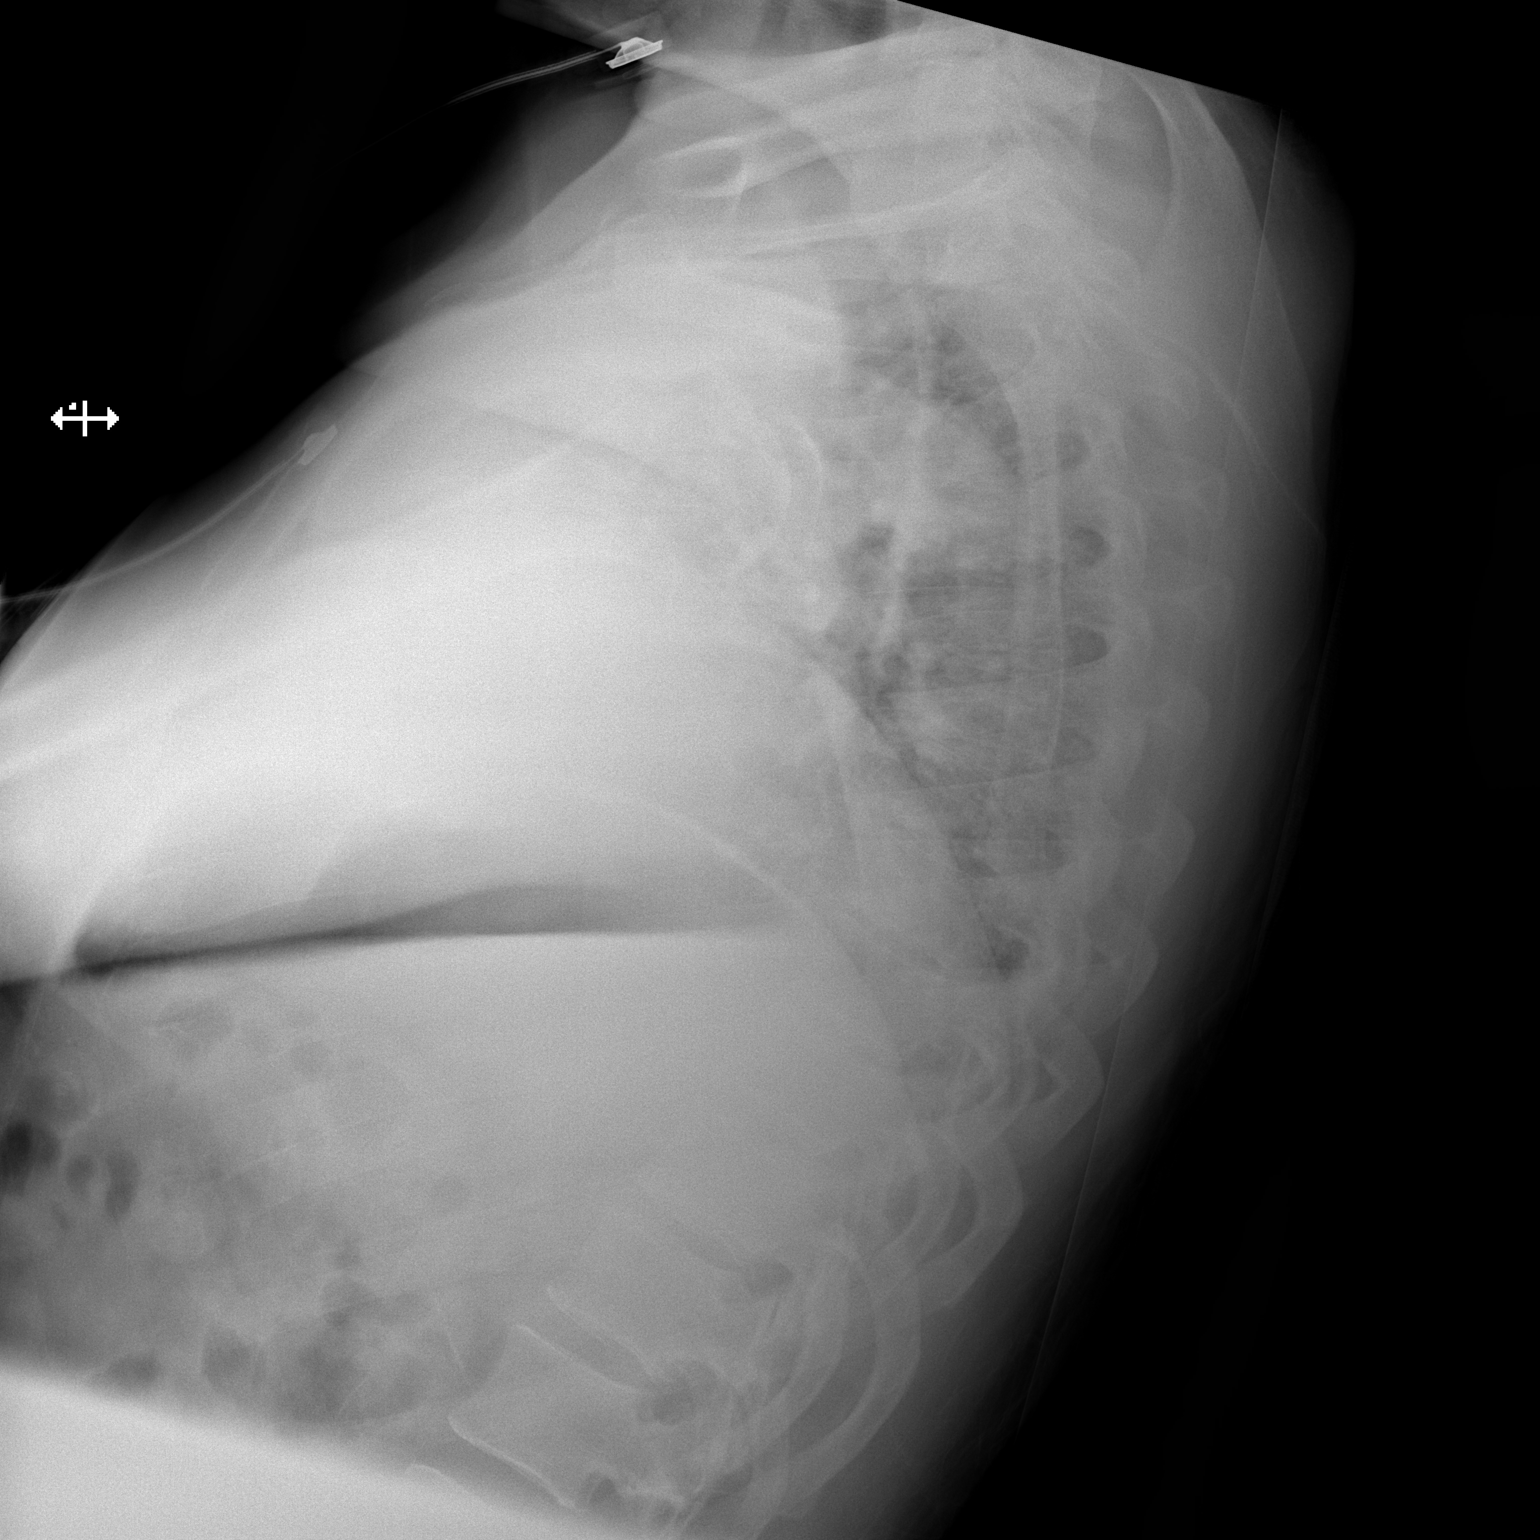

[x chest ap]
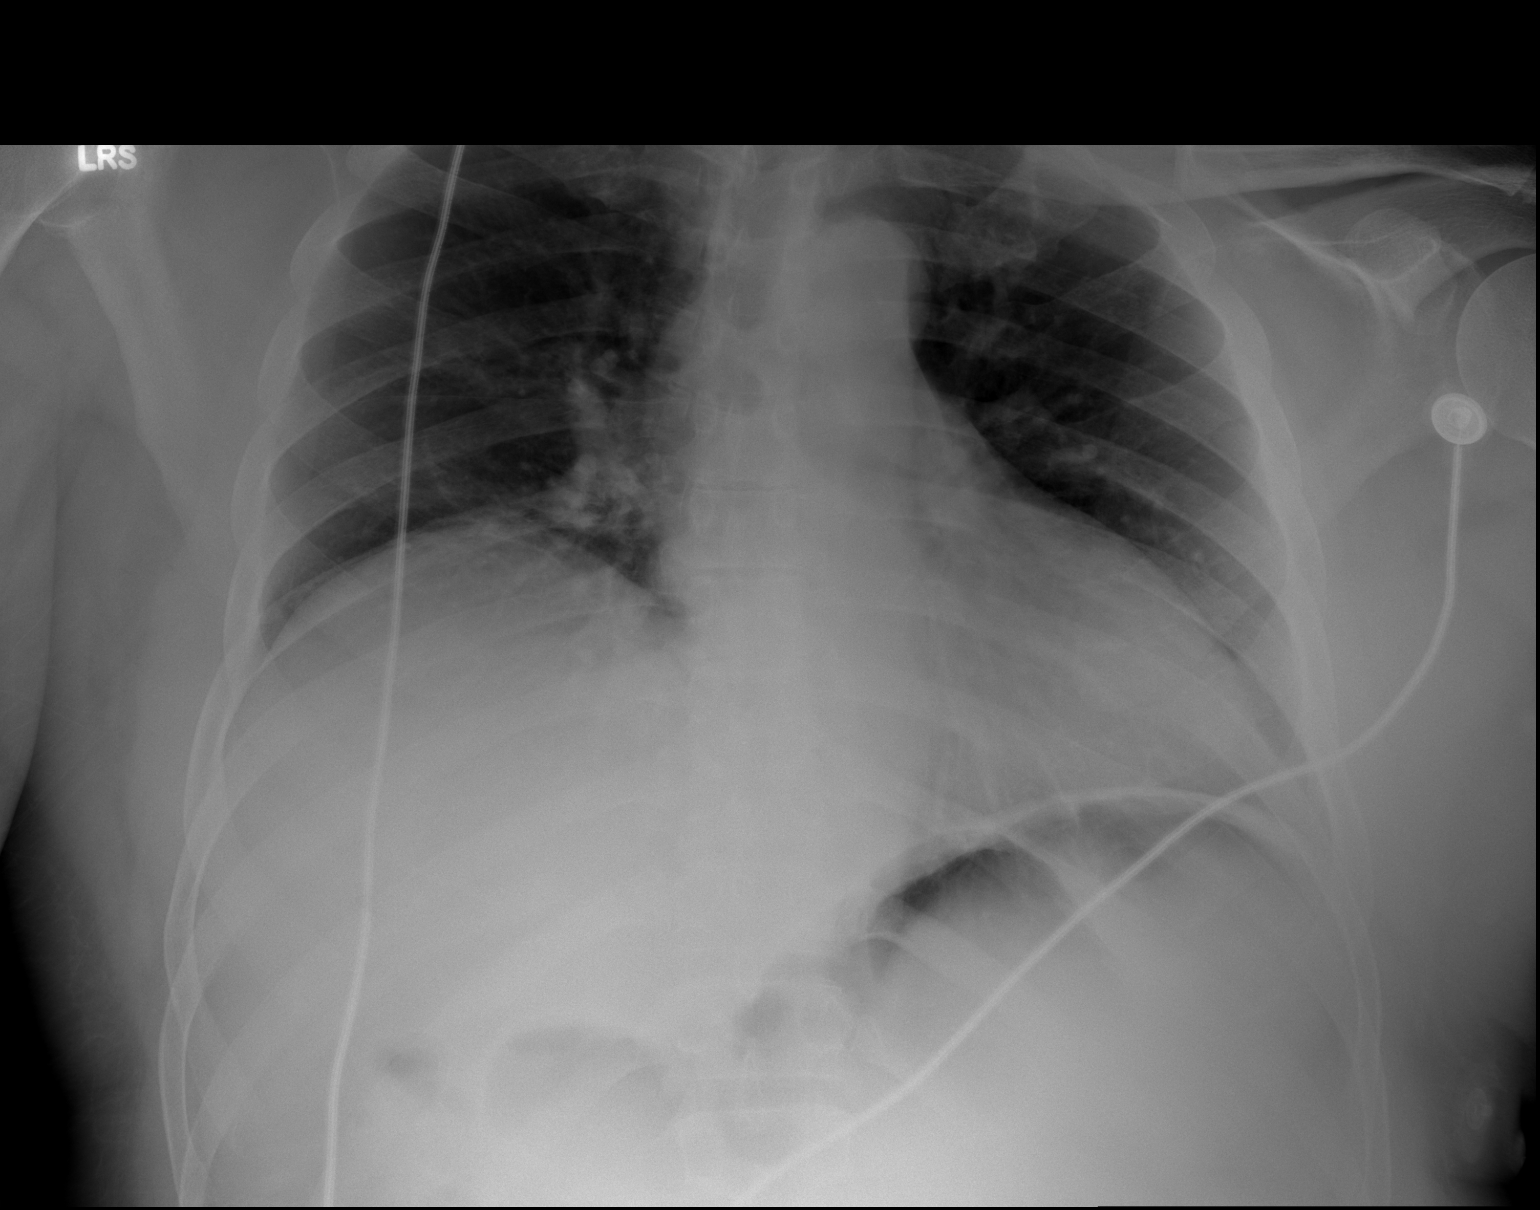

[2 of 2 positions shown; findings below may reference images not displayed]

FINDINGS: Shallow inspiration with elevation of right
hemidiaphragm.  Borderline heart size, likely normal for technique.
Pulmonary vascularity is not increased.  No blunting of
costophrenic angles.  No pneumothorax.  No focal airspace
consolidation.
IMPRESSION: No evidence of active pulmonary disease.

## 2013-05-04 IMAGING — CT CT HEAD W/O CM
1 series · 16 of 30 positions shown, 20 images · non-contrast
Comparison: None.

CLINICAL DATA: Dizziness, nausea, left-sided weakness, slurred
speech

CT HEAD WITHOUT CONTRAST
TECHNIQUE: Contiguous axial images were obtained from the base of
the skull through the vertex without contrast.

[Series 2: head routine 4.8 h37s · axial · 0.46mm/px · z∈[-106,+49]mm · 16 of 36 slices shown, 20 images]
[im 2/36  brain]
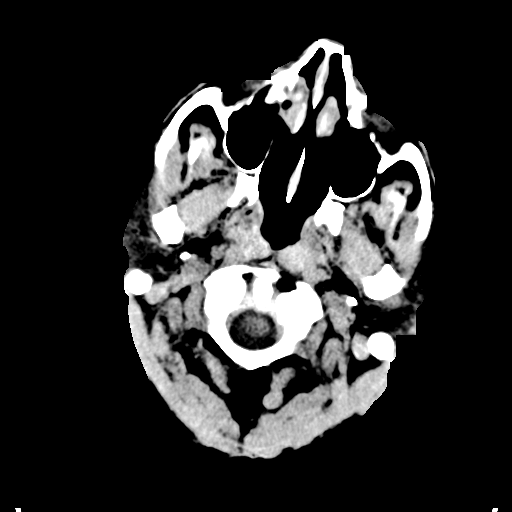
[im 2/36  bone]
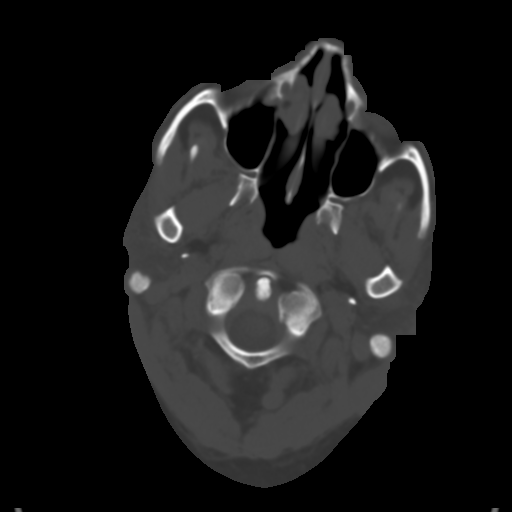
[im 4/36  brain]
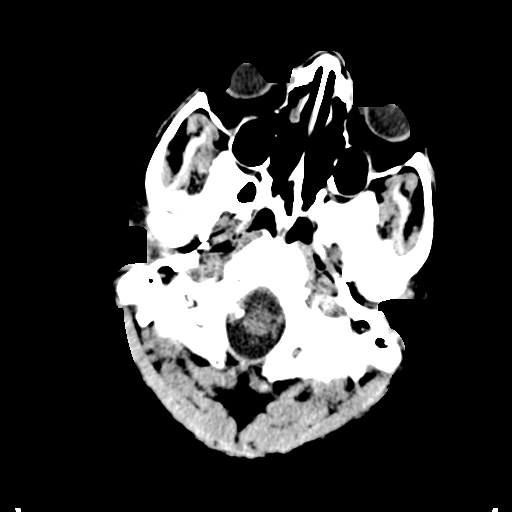
[im 7/36  brain]
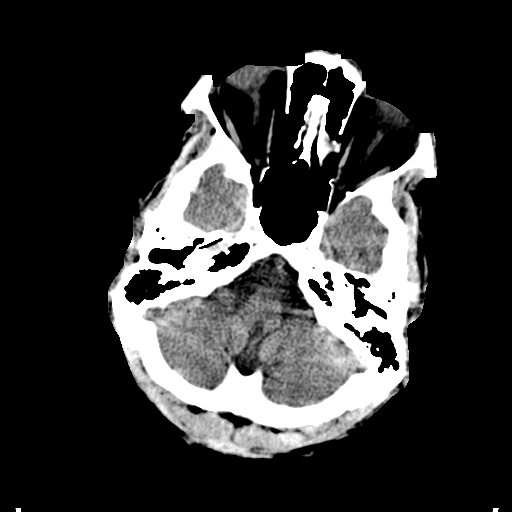
[im 9/36  brain]
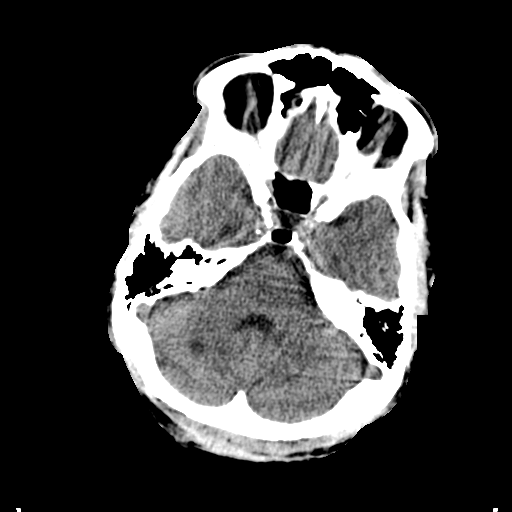
[im 10/36  brain]
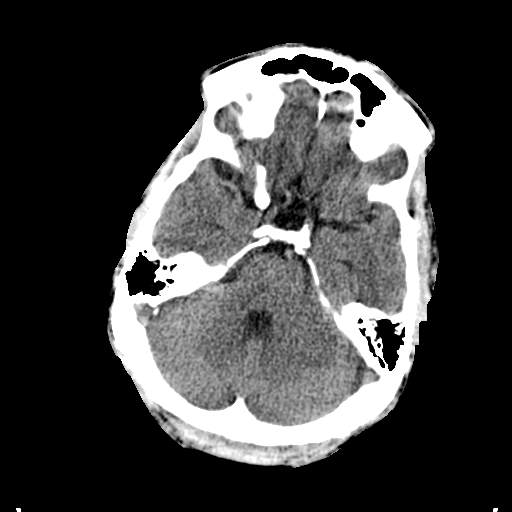
[im 10/36  bone]
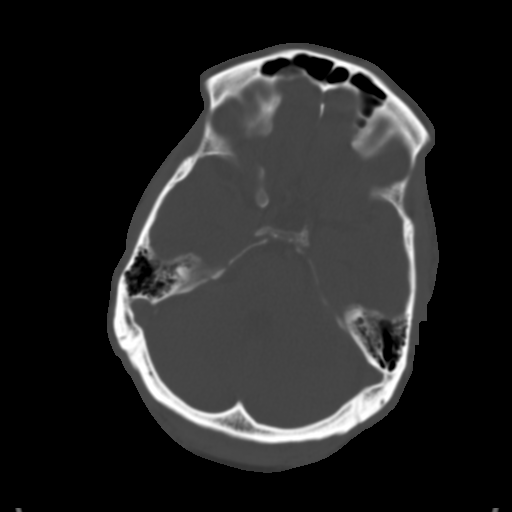
[im 13/36  brain]
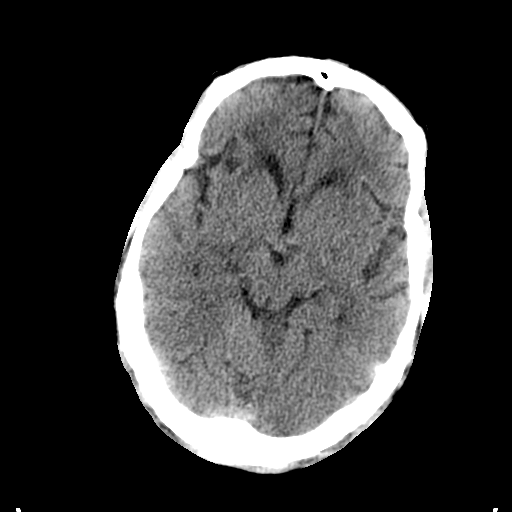
[im 15/36  brain]
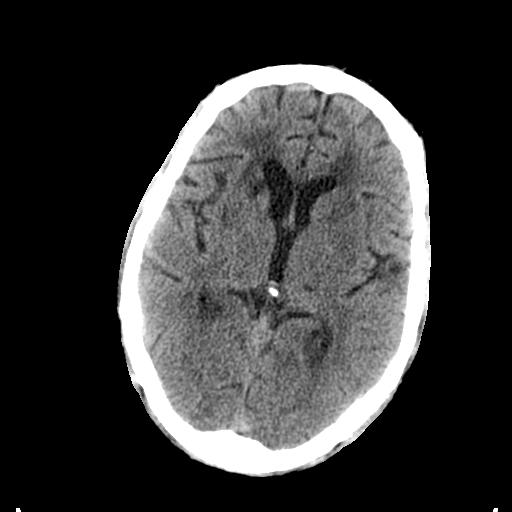
[im 17/36  brain]
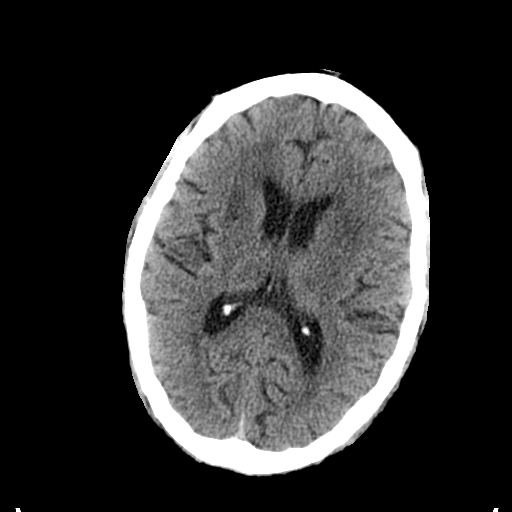
[im 19/36  brain]
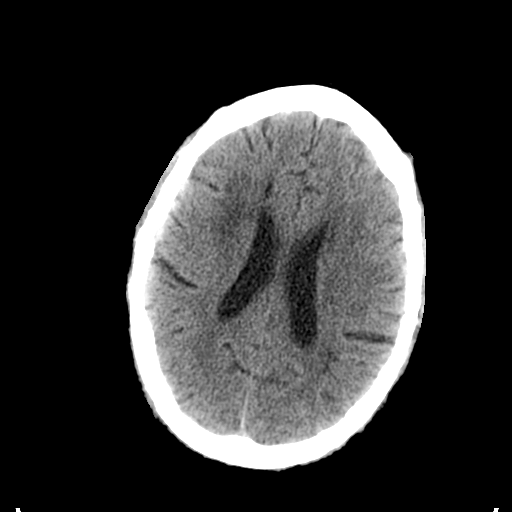
[im 19/36  bone]
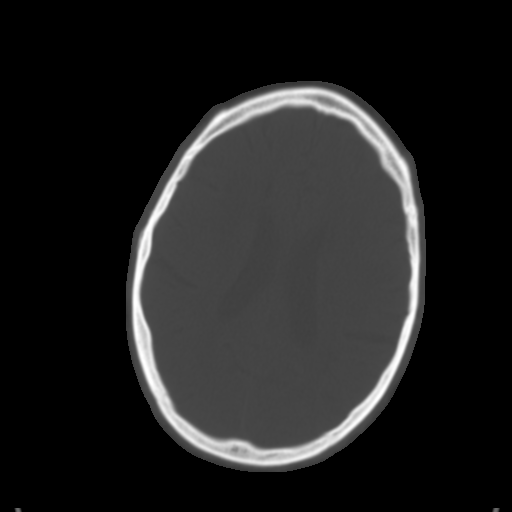
[im 21/36  brain]
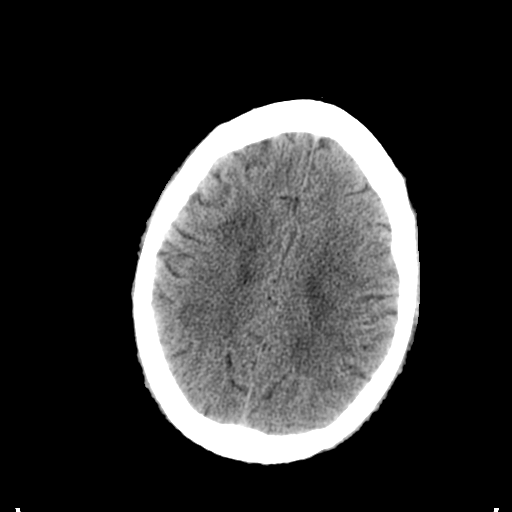
[im 23/36  brain]
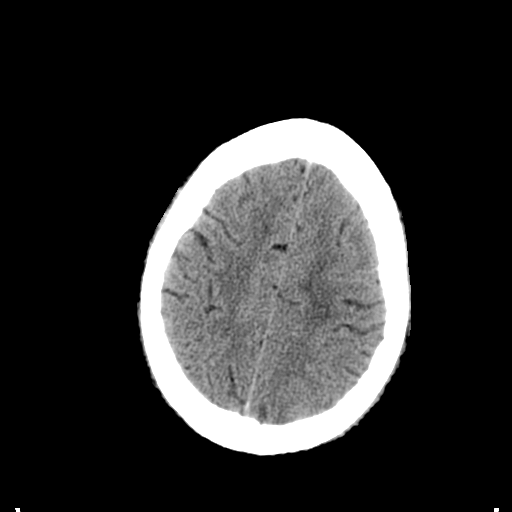
[im 26/36  brain]
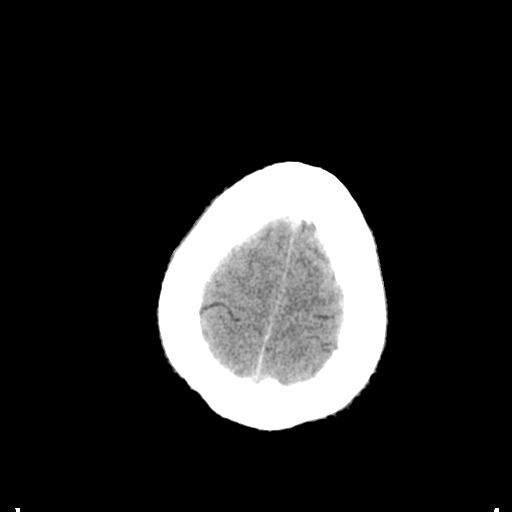
[im 27/36  brain]
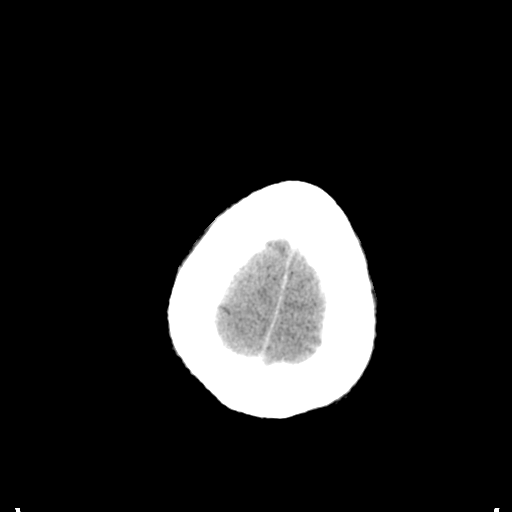
[im 27/36  bone]
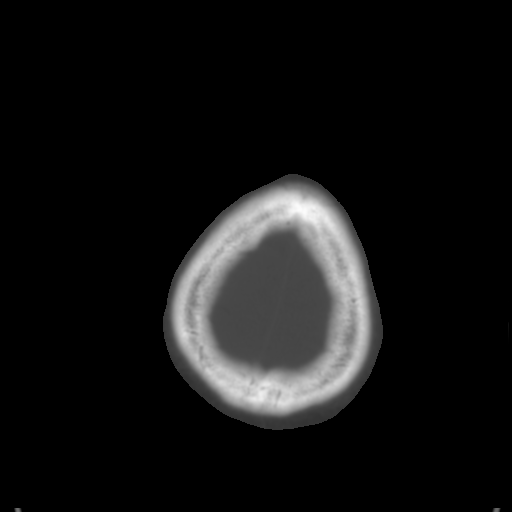
[im 29/36  brain]
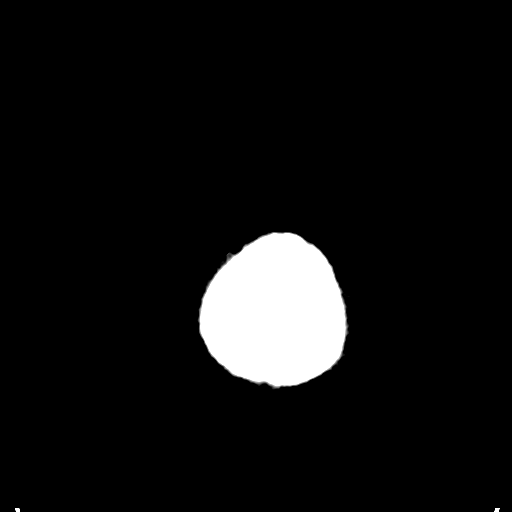
[im 32/36  brain]
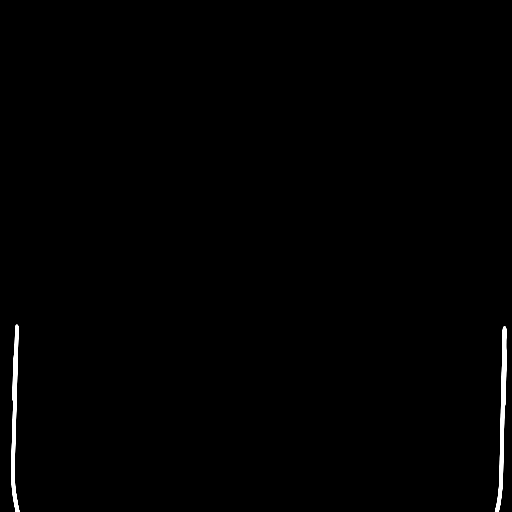
[im 34/36  brain]
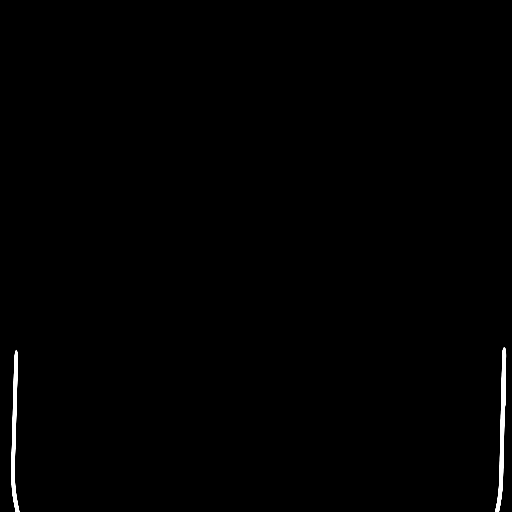

[16 of 30 positions shown; findings below may reference images not displayed]

FINDINGS: No evidence of parenchymal hemorrhage or extra-axial
fluid collection. No mass lesion, mass effect, or midline shift.

No CT evidence of acute infarction.

Extensive subcortical white matter and periventricular small vessel
ischemic changes, including the subcortical right frontal lobe
(series 2/image 18).  Old right caudate head lacunar infarct
(series 2/image 15).

Intracranial atherosclerosis.

Cerebral volume is age appropriate.  No ventriculomegaly.

Small mucous retention cyst in the bilateral maxillary sinuses.
The visualized paranasal sinuses are otherwise clear. The mastoid
air cells are unopacified.

No evidence of calvarial fracture.
IMPRESSION: No evidence of acute intracranial abnormality.

Extensive small vessel ischemic changes with old right caudate
lacunar infarct.

## 2013-05-12 ENCOUNTER — Emergency Department (HOSPITAL_COMMUNITY)
Admission: EM | Admit: 2013-05-12 | Discharge: 2013-05-13 | Disposition: A | Discharge status: Home / Self Care | Payer: BC Managed Care – PPO | Attending: Emergency Medicine | Admitting: Emergency Medicine

## 2013-05-12 ENCOUNTER — Other Ambulatory Visit: Payer: Self-pay

## 2013-05-12 DIAGNOSIS — Z860100 Personal history of colon polyps, unspecified: Secondary | ICD-10-CM | POA: Insufficient documentation

## 2013-05-12 DIAGNOSIS — R7889 Finding of other specified substances, not normally found in blood: Secondary | ICD-10-CM

## 2013-05-12 DIAGNOSIS — R569 Unspecified convulsions: Secondary | ICD-10-CM

## 2013-05-12 DIAGNOSIS — G40909 Epilepsy, unspecified, not intractable, without status epilepticus: Secondary | ICD-10-CM | POA: Insufficient documentation

## 2013-05-12 DIAGNOSIS — Z79899 Other long term (current) drug therapy: Secondary | ICD-10-CM | POA: Insufficient documentation

## 2013-05-12 DIAGNOSIS — Z7982 Long term (current) use of aspirin: Secondary | ICD-10-CM | POA: Insufficient documentation

## 2013-05-12 DIAGNOSIS — I10 Essential (primary) hypertension: Secondary | ICD-10-CM | POA: Insufficient documentation

## 2013-05-12 DIAGNOSIS — R5381 Other malaise: Secondary | ICD-10-CM | POA: Insufficient documentation

## 2013-05-12 DIAGNOSIS — Z8673 Personal history of transient ischemic attack (TIA), and cerebral infarction without residual deficits: Secondary | ICD-10-CM | POA: Insufficient documentation

## 2013-05-12 DIAGNOSIS — R748 Abnormal levels of other serum enzymes: Secondary | ICD-10-CM | POA: Insufficient documentation

## 2013-05-12 LAB — BASIC METABOLIC PANEL
BUN: 13 mg/dL (ref 6–23)
CO2: 32 mEq/L (ref 19–32)
Calcium: 9.4 mg/dL (ref 8.4–10.5)
Creatinine, Ser: 1.29 mg/dL (ref 0.50–1.35)
Glucose, Bld: 128 mg/dL — ABNORMAL HIGH (ref 70–99)

## 2013-05-12 LAB — VALPROIC ACID LEVEL: Valproic Acid Lvl: 49.7 ug/mL — ABNORMAL LOW (ref 50.0–100.0)

## 2013-05-12 LAB — GLUCOSE, CAPILLARY: Glucose-Capillary: 116 mg/dL — ABNORMAL HIGH (ref 70–99)

## 2013-05-12 MED ORDER — SODIUM CHLORIDE 0.9 % IV BOLUS (SEPSIS)
500.0000 mL | Freq: Once | INTRAVENOUS | Status: AC
  Administered 2013-05-12: 500 mL via INTRAVENOUS

## 2013-05-12 MED ORDER — PHENYTOIN SODIUM EXTENDED 100 MG PO CAPS
400.0000 mg | ORAL_CAPSULE | Freq: Once | ORAL | Status: AC
  Administered 2013-05-13: 400 mg via ORAL
  Filled 2013-05-12: qty 4

## 2013-05-12 MED ORDER — PHENYTOIN SODIUM EXTENDED 100 MG PO CAPS
200.0000 mg | ORAL_CAPSULE | Freq: Once | ORAL | Status: AC
  Administered 2013-05-13: 200 mg via ORAL
  Filled 2013-05-12: qty 2

## 2013-05-12 NOTE — ED Provider Notes (Signed)
CSN: [id number]     Arrival date & time 05/12/13  2044 History   First MD Initiated Contact with Patient 05/12/13 2059     Chief Complaint  Patient presents with  . Seizures   (Consider location/radiation/quality/duration/timing/severity/associated sxs/prior Treatment) HPI Comments: 62 yo male with HTN, stroke hx, seizures presents after seizure at home with family.  Witnessed, 5 min, general but worse left sided, similar to previous.  Pt has residual weakness on left from CVT.  Post ictal at home, at baseline in ED.  No recent infections or missed doses.  Pt has neuro fup.  Nothing known to cause seizure.  Pt feels well otherwise.   The history is provided by the patient and a relative.    Past Medical History  Diagnosis Date  . Hypertension   . Allergy     RHINITIS  . Diverticulosis   . Colonic polyp   . 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 Seizures    No past surgical history on file. Family History  Problem Relation Age of Onset  . Cancer Mother   . Arthritis Mother   . Heart disease Mother   . Prostate cancer Father   . Cancer Sister   . Colon cancer Neg Hx   . Rectal cancer Neg Hx   . Stomach cancer Neg Hx    History  Substance Use Topics  . Smoking status: Never Smoker   . Smokeless tobacco: Never Used  . Alcohol Use: No    Review of Systems  Constitutional: Positive for fatigue. Negative for fever and chills.  HENT: Negative for neck pain and neck stiffness.   Eyes: Negative for visual disturbance.  Respiratory: Negative for shortness of breath.   Cardiovascular: Negative for chest pain.  Gastrointestinal: Negative for vomiting and abdominal pain.  Genitourinary: Negative for dysuria and flank pain.  Musculoskeletal: Negative for back pain.  Skin: Negative for rash.  Neurological: Positive for seizures. Negative for light-headedness and headaches.    Allergies  Review of patient's allergies indicates no known  allergies.  Home Medications   Current Outpatient Rx  Name  Route  Sig  Dispense  Refill  . amLODipine (NORVASC) 10 MG tablet   Oral   Take 1 tablet (10 mg total) by mouth daily with breakfast.   30 tablet   2   . aspirin EC 81 MG tablet   Oral   Take 81 mg by mouth daily with breakfast.         . divalproex (DEPAKOTE) 500 MG DR tablet   Oral   Take 1 tablet (500 mg total) by mouth 2 (two) times daily.   60 tablet   11   . irbesartan (AVAPRO) 150 MG tablet   Oral   Take 150 mg by mouth daily with breakfast.         . Lacosamide (VIMPAT) 100 MG TABS      PT TAKES 1 TABLET EVERY MORNING AND 1 TABLET AT BEDTIME   60 tablet   5     Pharmacy Fax [phone]   . nebivolol (BYSTOLIC) 10 MG tablet   Oral   Take 10 mg by mouth daily with breakfast.         . phenytoin (DILANTIN) 100 MG ER capsule   Oral   Take 100 mg by mouth as directed. Take 1 capsule twice daily on Tuesday, Thursday, Saturday, & Sunday. Take  1 capsule three times daily on Monday, Wednesday, & Friday.  BP 173/95  Pulse 79  Temp(Src) 98.4 F (36.9 C)  Resp 20  SpO2 100% Physical Exam  Nursing note and vitals reviewed. Constitutional: He is oriented to person, place, and time. He appears well-developed and well-nourished.  HENT:  Head: Normocephalic and atraumatic.  Eyes: Conjunctivae are normal. Right eye exhibits no discharge. Left eye exhibits no discharge.  Neck: Normal range of motion. Neck supple. No tracheal deviation present.  Cardiovascular: Normal rate and regular rhythm.   Pulmonary/Chest: Effort normal and breath sounds normal.  Abdominal: Soft. He exhibits no distension. There is no tenderness. There is no guarding.  Musculoskeletal: He exhibits no edema.  Neurological: He is alert and oriented to person, place, and time. No cranial nerve deficit. GCS eye subscore is 4. GCS verbal subscore is 5. GCS motor subscore is 6.  Left arm and leg 4+ weakness (baseline for  pt) Sensation intact  Skin: Skin is warm. No rash noted.  Psychiatric: He has a normal mood and affect.    ED Course  Procedures (including critical care time) Labs Review Labs Reviewed  GLUCOSE, CAPILLARY - Abnormal; Notable for the following:    Glucose-Capillary 116 (*)    All other components within normal limits  PHENYTOIN LEVEL, TOTAL - Abnormal; Notable for the following:    Phenytoin Lvl 6.3 (*)    All other components within normal limits  VALPROIC ACID LEVEL - Abnormal; Notable for the following:    Valproic Acid Lvl 49.7 (*)    All other components within normal limits  BASIC METABOLIC PANEL - Abnormal; Notable for the following:    Glucose, Bld 128 (*)    GFR calc non Af Amer 58 (*)    GFR calc Af Amer 68 (*)    All other components within normal limits   Imaging Review No results found.  MDM  No diagnosis found. Observed in ED, no recurrent seizures. Levels low, discussed with pt.  Plan for oral load of 600 mg tonight and he will take an extra 300 mg in am and fup with neuro. Family with pt, pt wishes to go home. Well appearing in ED.  DC  Enid Skeens, MD 05/13/13 [phone]

## 2013-05-12 NOTE — ED Notes (Signed)
Bed: XB14 Expected date:  Expected time:  Means of arrival:  Comments: EMS 62 yo M seizure

## 2013-05-12 NOTE — ED Notes (Signed)
Per EMS report: pt from home: pt was sitting at home eating dinner when pt began leaning over and then began to have a convulsions.  Pt reports remembering seizure.  Seizure witness by about 5 minutes.  On EMS arrival, pt was post-ictal but on arrival to ED, pt is a/o x 4.  Pt hx of CVA x 3 and pt began having seizures have strokes.  Pt's son reports this seizure was typical of the seizures the pt has.  Pt on average has a seizure about every month since pt's last CVA.  No tongue injury or incontinence noted.  Pt has residual left sided weakness from CVA. EMS VS: BP: 172/101, HR: 79, CBG: 104, 98% RA

## 2013-05-13 ENCOUNTER — Telehealth: Payer: Self-pay | Admitting: Neurology

## 2013-05-13 NOTE — Telephone Encounter (Signed)
Benicio Manna says patient had a sz on yesterday. Dilantin level was down. She says this is the first seizure patient has had since last OV on 03/24/13. Just wanted to notify NP and MD to see if anything needs to be done differently. Next OV f/u appt is 06/08/13 w/ Dr. Pearlean Brownie.

## 2013-05-14 ENCOUNTER — Encounter: Payer: Self-pay | Admitting: Family Medicine

## 2013-05-14 ENCOUNTER — Telehealth: Payer: Self-pay | Admitting: Nurse Practitioner

## 2013-05-14 ENCOUNTER — Ambulatory Visit (INDEPENDENT_AMBULATORY_CARE_PROVIDER_SITE_OTHER): Payer: BC Managed Care – PPO | Admitting: Family Medicine

## 2013-05-14 VITALS — BP 124/82 | HR 66 | Wt 185.0 lb

## 2013-05-14 DIAGNOSIS — Z23 Encounter for immunization: Secondary | ICD-10-CM

## 2013-05-14 DIAGNOSIS — I69998 Other sequelae following unspecified cerebrovascular disease: Secondary | ICD-10-CM

## 2013-05-14 DIAGNOSIS — R3989 Other symptoms and signs involving the genitourinary system: Secondary | ICD-10-CM

## 2013-05-14 DIAGNOSIS — R209 Unspecified disturbances of skin sensation: Secondary | ICD-10-CM

## 2013-05-14 DIAGNOSIS — R82998 Other abnormal findings in urine: Secondary | ICD-10-CM

## 2013-05-14 DIAGNOSIS — R569 Unspecified convulsions: Secondary | ICD-10-CM

## 2013-05-14 DIAGNOSIS — R8290 Unspecified abnormal findings in urine: Secondary | ICD-10-CM

## 2013-05-14 DIAGNOSIS — G40209 Localization-related (focal) (partial) symptomatic epilepsy and epileptic syndromes with complex partial seizures, not intractable, without status epilepticus: Secondary | ICD-10-CM

## 2013-05-14 MED ORDER — PHENYTOIN SODIUM EXTENDED 100 MG PO CAPS: 100.0000 mg | ORAL_CAPSULE | Freq: Three times a day (TID) | ORAL | Status: DC

## 2013-05-14 NOTE — Telephone Encounter (Signed)
Spoke to [name], patient's wife concerning recent seizure. Dilantin level was low, 6.9 in ER. No missed doses, no infection known.  Advised to Increase Dilantin 100 mg ER to TID every day.  Patient additionally taking Vimpat 100mg  BID and Depakote 500 mg BID. Keep next scheduled appointment in October, we will check lab levels then.  Wife acknowledged change.  States she thinks her husband's urine smells fruity, he does not complain of UTI symptoms though.  They have appt at [facility] this am, asked her to have them check UA while there.  She agreed.

## 2013-05-14 NOTE — Progress Notes (Signed)
  Subjective:    Patient ID: Craig [name], male    DOB: 03-Jan-1951, 62 y.o.   MRN: [id number]  HPI He is here for an interval evaluation. He recently had a seizure. His medications are in the process of being readjusted. He is now taking Depakote. They have noted a fruity smell to his urine but he has had no frequency, urgency, dysuria, fever or chills. He also has a lesion present on his left forearm that he would like evaluated. His wife is concerned about slight left lower extremity swelling.   Review of Systems     Objective:   Physical Exam Alert and in no distress. Exam of his left forearm shows a benign 1 cm relatively flat lesion. Lower extremity exam shows no edema. Ankles are normal. Knee is normal. Urine dipstick was positive and microscopic did show white cells, bacteria and occasional red blood cells.       Assessment & Plan:  Abnormal urine color - Plan: POCT Urinalysis Dipstick  Need for prophylactic vaccination and inoculation against influenza  Alterations of sensations, late effect of cerebrovascular disease  Complex partial epileptic seizure  Abnormal urinalysis  I will await the urine culture results before treating since he is really not having any symptoms. Flu shot given with risks and benefits described. Also discussed the seizure disorder and recommended that he check with neurology concerning repeat visits to the emergency room and possibly waiting to be seen in our office instead of an ER visit.

## 2013-05-14 NOTE — Telephone Encounter (Signed)
Please call Craig [name], yes increase Dilantin to 3 times every day.

## 2013-05-15 NOTE — Telephone Encounter (Signed)
Spoke to spouse. Informed.

## 2013-05-21 ENCOUNTER — Other Ambulatory Visit: Payer: Self-pay

## 2013-05-21 MED ORDER — LEVOFLOXACIN 500 MG PO TABS: 500.0000 mg | ORAL_TABLET | Freq: Every day | ORAL | Status: DC

## 2013-05-21 NOTE — Telephone Encounter (Signed)
WIFE INFORMED OF MED AND RESULTS

## 2013-05-25 ENCOUNTER — Ambulatory Visit: Payer: BC Managed Care – PPO | Admitting: Nurse Practitioner

## 2013-05-28 ENCOUNTER — Telehealth: Payer: Self-pay

## 2013-05-28 NOTE — Telephone Encounter (Signed)
MRS.Moye CALLED AND WANTED TO KNOW WHAT SHE COULD GIVE Craig [name] FOR STUFFY NOSE AND IF HE RUNS A FEVER WHAT CAN SHE GIVE HIM? SHE ALSO SAID MR.Pickard BROTHER PASSED AND THEY ARE GOING TO [place] FOR THE FUNERAL SHE WAS TOLD TO LET us KNOW WHEN THEY WERE GOING OUT OF TOWN BECAUSE THEY DON'T KNOW HOW HE IS GOING TO REACT PLEASE ADVISE

## 2013-05-28 NOTE — Telephone Encounter (Signed)
He can take Tylenol for fever aches and pains. An over-the-counter decongestant for short period of time is okay. I'm sure he will be upset over his brother and that's normal

## 2013-05-28 NOTE — Telephone Encounter (Signed)
MRS.Anastas WAS INFORMED WORD FOR WORD AND VERBALIZED UNDERSTANDING

## 2013-06-08 ENCOUNTER — Encounter: Payer: Self-pay | Admitting: Neurology

## 2013-06-08 ENCOUNTER — Ambulatory Visit (INDEPENDENT_AMBULATORY_CARE_PROVIDER_SITE_OTHER): Payer: BC Managed Care – PPO | Admitting: Neurology

## 2013-06-08 VITALS — BP 146/86 | HR 61 | Temp 97.8°F | Ht 69.75 in | Wt 186.0 lb

## 2013-06-08 DIAGNOSIS — G40209 Localization-related (focal) (partial) symptomatic epilepsy and epileptic syndromes with complex partial seizures, not intractable, without status epilepticus: Secondary | ICD-10-CM

## 2013-06-08 MED ORDER — VALPROIC ACID 250 MG/5ML PO SYRP: 750.0000 mg | ORAL_SOLUTION | Freq: Two times a day (BID) | ORAL | Status: DC

## 2013-06-08 NOTE — Progress Notes (Signed)
[facility]  PATIENT: Craig [name] DOB: [birth year]   HISTORY FROM: patient, wife, chart REASON FOR VISIT: follow up seizures   HISTORICAL  CHIEF COMPLAINT:  Chief Complaint  Patient presents with  . Follow-up    2 mo    HISTORY OF PRESENT ILLNESS: UPDATE 03/24/13 (LL): Since last office visit, patient had a witnessed seizure 02/22/13 that occurred prior to arrival to ER. Patient's wife witnessed a seizure that lasted about 5 minutes. She reports generalized jerking movements. She denies head trauma but does report the patient bit his tongue. No aggravating/allevaiting factors. No associated symptoms.  He had another similar seizure on 12/17/12. He is currently taking Dilantin extended release 100 mg, one twice daily on Tuesday, Thursday, Saturday and Sunday. One capsule 3 times daily on Monday, Wednesday, and Friday. Also Vimpat 100 mg tablets, one in the morning, and 2 at night. Wife reports that his memory has been getting much worse over the course of the year.  Sometimes now he even forgets that he has eaten 30 minutes after finishing. He looks to his wife to answer for him when questioned about time, date, location. MMSE at last visit was 24/30 with deficits in orientation and, recall, handwriting.  TSH, homocystine, vitamin B12, RPR were all checked at last visit and normal.  UPDATE 10/29/12 (PS): He is seen urgently today his family requested after having a seizure on 09/05/2012. He was seen at [facility] ER Dilantin level was low and he was given 1000 mg loading dose. He was given antibiotics for UTI. He called the office complaining of what he felt for seizure auras and was asked increased impact 300 mg daily from 10/21/2012 he has been having memory difficulty since then. He denies depression but tested positive on depression screen and score 28/30 on MMSE today. He is not have Dilantin level checked again. He denies dizziness, vertigo, sleepiness or gait  ataxia.  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 Vascular risk factors include hypertension, elevated hemoglobin A1c. He presented to [facility] after being found at home by family on the floor. He had awakened with dizziness and nausea, when going to the bathroom lost his balance and fell. He had numbness and weakness for 2 weeks prior and vision changes. MRA showed diffuse intracranial atherosclerotic disease. 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 Occlusion of the left superior cerebellar artery compatible with areas of acute infarction. 2 Vicodin carotid Doppler normal. LDL 75, hemoglobin A1c 6.4. He is now on aspirin 81 mg stopped clopidogrel recently. He continues with outpatient speech, occupational and physical therapy, doing well. Using a 3 prong cane at times and walker. Update 06/08/2013 He returns for f/u after last visit 03/24/13 with Heide Guile, NP.He had another seizure in setting of UTI on 05/11/13 and was seen in Er and asked to increase Vimpat dose to 100 mg am and 200 mg hs but wife did not do so due to fear of cognitive side effects as he is still having memory difficulties which began since starting Vimpat. He was started on depakote during last visit but has trouble swallowing the tablets and wife is requesting liquid form if possible.He had EEG done on 04/11/13 which showed focal right hemispheric slowing without  definite epileptiform features.  REVIEW OF SYSTEMS: Full 14 system review of systems performed and notable only for: confusion, joint swelling, seizure, sleepiness and allergies    Filed Vitals:   06/08/13 1411  BP: 146/86  Pulse: 61  Temp: 97.8 F  (36.6 C)     ALLERGIES: No Known Allergies  HOME MEDICATIONS: Outpatient Prescriptions Prior to Visit  Medication Sig Dispense Refill  . amLODipine (NORVASC) 10 MG tablet Take 1 tablet (10 mg total) by mouth daily with breakfast.  30 tablet  2  . aspirin EC 81 MG tablet Take 81 mg by mouth daily with breakfast.      . irbesartan (AVAPRO) 150 MG tablet Take 150 mg by mouth daily with breakfast.      . Lacosamide (VIMPAT) 100 MG TABS PT TAKES 1 TABLET EVERY MORNING AND 1 TABLET AT BEDTIME  60 tablet  5  . nebivolol (BYSTOLIC) 10 MG tablet Take 10 mg by mouth daily with breakfast.      . phenytoin (DILANTIN) 100 MG ER capsule Take 1 capsule (100 mg total) by mouth 3 (three) times daily.  270 capsule  3  . divalproex (DEPAKOTE) 500 MG DR tablet Take 1 tablet (500 mg total) by mouth 2 (two) times daily.  60 tablet  11  . levofloxacin (LEVAQUIN) 500 MG tablet Take 1 tablet (500 mg total) by mouth daily.  10 tablet  0   No facility-administered medications prior to visit.    PAST MEDICAL HISTORY: Past Medical History  Diagnosis Date  . Hypertension   . Allergy     RHINITIS  . Diverticulosis   . Colonic polyp   . 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 Seizures     PAST SURGICAL HISTORY: No past surgical history on file.  FAMILY HISTORY: Family History  Problem Relation Age of Onset  . Cancer Mother   . Arthritis Mother   . Heart disease Mother   . Prostate cancer Father   . Cancer Sister   . Colon cancer Neg Hx   . Rectal cancer Neg Hx   . Stomach cancer Neg Hx     SOCIAL HISTORY: History   Social History  . Marital Status: Married    Spouse Name: Britta Mccreedy    Number of Children: 1  . Years of Education: college   Occupational History  . retired [employer]   Social History Main Topics  . Smoking status: Never Smoker   . Smokeless tobacco: Never Used  . Alcohol Use: No  . Drug Use: No  . Sexual Activity: Yes   Other  Topics Concern  . Not on file   Social History Narrative   Patient lives at home with his wife  has 2 years of college and 1 child.    Patient works at  [employer]  Patient denies smoking tobacco,drinking alcohol and taking all illict drugs.    Patient is a caffeine drinker.     PHYSICAL EXAM  Filed Vitals:   06/08/13 1411  BP: 146/86  Pulse: 61  Temp: 97.8 F (36.6 C)  TempSrc: Oral  Height: 5' 9.75" (1.772 m)  Weight: 186 lb (84.369 kg)   Body mass index is 26.87 kg/(m^2).  Generalized: In no acute distress, pleasant African American male.  Neck: Supple, no carotid bruits   Cardiac: Regular rate rhythm, no murmur   Pulmonary: Clear to auscultation bilaterally   Musculoskeletal: Left foot drop   Neurological examination  Mentation: Alert oriented to time, place, history taking, language fluent, and causual conversation.MMSE 18/30 with decrease orientation, recall.Clock drawing 1/4. AFT  5.  Cranial nerve II-XII: Pupils were equal round reactive to light extraocular movements were full, visual fields show partial left field cut to confrontational testing. facial sensation and strength were normal. hearing was intact to finger rubbing bilaterally. Uvula tongue midline. head turning and shoulder shrug and were normal and symmetric.Tongue protrusion into cheek strength was normal. MOTOR: Reveals no upper or lower extremity drift. Symmetric and equal strength in all 4 extremities except 4/5 left upper extremity, hip flexor, left ankle. No focal weakness. Diminished fine motor to left hand. Right hand orbits left hand. SENSORY: Touch and pinprick sensations are normal except diminished sensation on left upper extremity and mild on left lower stomach. COORDINATION: Normal except for mild dysmetria to left finger to finger and finger to nose. REFLEXES: Deep tendon reflexes are 2+ symmetric except more brisk on left upper and lower extremity. GAIT/STATION: Steady gait with left foot  dragging slightly improved with 3-prong cane, unable to do tandem walking  DIAGNOSTIC DATA (LABS, IMAGING, TESTING) - I reviewed patient records, labs, notes, testing and imaging myself where available.  Lab Results  Component Value Date   WBC 11.6* 02/22/2013   HGB 17.4* 02/22/2013   HCT 52.2* 02/22/2013   MCV 83.7 02/22/2013   PLT 216 02/22/2013      Component Value Date/Time   NA 139 05/12/2013 2155   K 3.7 05/12/2013 2155   CL 102 05/12/2013 2155   CO2 32 05/12/2013 2155   GLUCOSE 128* 05/12/2013 2155   BUN 13 05/12/2013 2155   CREATININE 1.29 05/12/2013 2155   CREATININE 1.37* 05/15/2011 1154   CALCIUM 9.4 05/12/2013 2155   PROT 7.6 02/19/2012 1325   ALBUMIN 3.8 02/19/2012 1325   AST 16 02/19/2012 1325   ALT 16 02/19/2012 1325   ALKPHOS 63 02/19/2012 1325   BILITOT 0.4 02/19/2012 1325   GFRNONAA 58* 05/12/2013 2155   GFRAA 68* 05/12/2013 2155   Lab Results  Component Value Date   CHOL 139 12/07/2011   HDL 52 12/07/2011   LDLCALC 75 12/07/2011   TRIG 59 12/07/2011   CHOLHDL 2.7 12/07/2011   Lab Results  Component Value Date   HGBA1C 6.4* 12/07/2011   No results found for this basename: [id number]   Lab Results  Component Value Date   TSH 0.292* 12/07/2011   12/07/2011 MRI HEAD WITHOUT CONTRAST  Acute right posterior cerebral artery infarct. Acute left superior cerebellar infarct. Extensive chronic ischemic changes. Scattered areas of chronic micro hemorrhage. These findings suggest chronic severe hypertension.  12/07/2011 MRA HEAD WITHOUT CONTRAST  Diffuse intracranial atherosclerotic disease. 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 Occlusion of the left superior cerebellar artery compatible with areas of acute infarction. Carotid duplex 02/27/2013 Preliminary report: Bilateral: Less than 39% ICA stenosis. Vertebral artery flow is antegrade. Left vertebral waveform is pulsatile with no diastolic component.  11/05/12 MRI brain with and without contrast:  This MRI scan of the brain shows old right  posterior cerebral, left superior cerebellar and right pontine and left cerebellar lacunar infarcts of remote age. There are moderate changes of chronic microvascular disease and severe changes of chronic paranasal sinus inflammation and bilateral maxillary antrum mucous retention cyst/polyps. No enhancing lesions are noted.  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 Vascular risk factors include hypertension, elevated hemoglobin A1c. Also with 1 episode of involuntary head movement with incontinence and tongue biting likely generalized seizure secondary to cerebral infarct. Had another seizure on 07/16/2012, and another breakthrough seizure on 09/04/2012 and on 05/11/13 in setting of UTI. Additional seizures on 12/17/2012 and 02/23/2013. New complaints of memory loss since the seizures, memory panel labs were normal. Increased Vimpat may be contributing to memory loss vs. Silent seizures. MRI scan of the brain showed no new stroke.   PLAN: Continue ASA 81 mg daily for secondary stroke prevention. Maintain strict blood pressure control cold blood pressure below 130/80.   Change Depakote tablets to Depakene syrup and takes 750 mL twice daily for seizures. Continue Dilantin and Vimpat and the current dosages. May consider reducing Vimpat when he has been seizure-free for several months as it may be contributing to his memory loss. Return for followup in 3 months with Larita Fife, NP or. call earlier if necessary.  No orders of the defined types were placed in this encounter.    Meds ordered this encounter  Medications  . Valproic Acid (DEPAKENE) 250 MG/5ML SYRP syrup    Sig: Take 15 mLs (750 mg total) by  mouth 2 (two) times daily.    Dispense:  600 mL    Refill:  3    Delia Heady, MD  06/08/2013, 9:33 PM  [facility] [address] [place], [place] [address] [phone]

## 2013-06-08 NOTE — Patient Instructions (Addendum)
Change Depakote tablets to Depakene syrup and takes 750 mL twice daily for seizures. Continue Dilantin and Vimpat and the current dosages. May consider reducing Vimpat when he has been seizure-free for several months as it may be contributing to his memory loss. Return for followup in 3 months with Larita Fife, NP or. call earlier if necessary.

## 2013-06-09 ENCOUNTER — Telehealth: Payer: Self-pay | Admitting: Neurology

## 2013-06-11 ENCOUNTER — Telehealth: Payer: Self-pay | Admitting: Neurology

## 2013-06-12 NOTE — Telephone Encounter (Signed)
Spoke to spouse who is very concerned about patient taking valporic acid syrup and aspirin 81mg  together daily.  Advised spouse spoke with Dr. Pearlean Brownie who confirmed ok to take both meds, but not together.  Spouse agreed to give meds at separate times.

## 2013-06-17 ENCOUNTER — Other Ambulatory Visit: Payer: Self-pay | Admitting: Family Medicine

## 2013-07-15 ENCOUNTER — Other Ambulatory Visit: Payer: Self-pay | Admitting: Neurology

## 2013-07-15 MED ORDER — PHENYTOIN SODIUM EXTENDED 100 MG PO CAPS: 100.0000 mg | ORAL_CAPSULE | Freq: Three times a day (TID) | ORAL | Status: DC

## 2013-07-17 ENCOUNTER — Telehealth: Payer: Self-pay | Admitting: Neurology

## 2013-07-17 NOTE — Telephone Encounter (Signed)
I called and let VM that Rx was submitted to [facility] on Nov 12. It is for a 3 month supply. Please call 1-2 weeks before next refill due.

## 2013-07-21 ENCOUNTER — Telehealth: Payer: Self-pay | Admitting: Neurology

## 2013-07-21 NOTE — Telephone Encounter (Signed)
I have not had access to Epic for over 2 weeks.  I called the pharmacy.  The patient has been taking generic Phenytoin, however the pharmacy has a new manufacturer and cannot get the old one at this time.  The only option would be dispensing the same medication through a different manufacturer. Patient is nearly out of meds, so they will dispense the meds they have in stock.  Advised them to have the patient contact us if he notices any changes in how he's feeling on this manufacturer.  Although the manufacturer has changed, the medication (Phenytoin) remains the same.

## 2013-08-17 ENCOUNTER — Encounter: Payer: Self-pay | Admitting: Nurse Practitioner

## 2013-08-17 ENCOUNTER — Encounter (INDEPENDENT_AMBULATORY_CARE_PROVIDER_SITE_OTHER): Payer: Self-pay

## 2013-08-17 ENCOUNTER — Ambulatory Visit (INDEPENDENT_AMBULATORY_CARE_PROVIDER_SITE_OTHER): Payer: BC Managed Care – PPO | Admitting: Nurse Practitioner

## 2013-08-17 VITALS — BP 158/93 | HR 55

## 2013-08-17 DIAGNOSIS — R410 Disorientation, unspecified: Secondary | ICD-10-CM

## 2013-08-17 DIAGNOSIS — R413 Other amnesia: Secondary | ICD-10-CM

## 2013-08-17 DIAGNOSIS — G40209 Localization-related (focal) (partial) symptomatic epilepsy and epileptic syndromes with complex partial seizures, not intractable, without status epilepticus: Secondary | ICD-10-CM

## 2013-08-17 DIAGNOSIS — R3915 Urgency of urination: Secondary | ICD-10-CM

## 2013-08-17 MED ORDER — PHENYTOIN SODIUM EXTENDED 100 MG PO CAPS: 100.0000 mg | ORAL_CAPSULE | Freq: Three times a day (TID) | ORAL | Status: DC

## 2013-08-17 MED ORDER — LACOSAMIDE 100 MG PO TABS: ORAL_TABLET | ORAL | Status: DC

## 2013-08-17 MED ORDER — VALPROIC ACID 250 MG/5ML PO SYRP: 500.0000 mg | ORAL_SOLUTION | Freq: Two times a day (BID) | ORAL | Status: DC

## 2013-08-17 NOTE — Progress Notes (Addendum)
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 Vascular risk factors include hypertension, elevated hemoglobin A1c. He presented to [facility] after being found at home by family on the floor. He had awakened with dizziness and nausea, when going to the bathroom lost his balance and fell. He had numbness and weakness for 2 weeks prior and vision changes. MRA showed diffuse intracranial atherosclerotic disease. 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 Occlusion of the left superior cerebellar artery compatible with areas of acute infarction. 2 Vicodin carotid Doppler normal. LDL 75, hemoglobin A1c 6.4. He is now on aspirin 81 mg stopped clopidogrel recently. He continues with outpatient speech, occupational and physical therapy, doing well. Using a 3 prong cane at times and walker.   UPDATE 10/29/12 (PS): He is seen urgently today his family requested after having a seizure on 09/05/2012. He was seen at [facility] Dilantin level was low and he was given 1000 mg loading dose. He was given antibiotics for UTI. He called the office complaining of what he felt for seizure auras and was asked increased impact 300 mg daily from 10/21/2012 he has been having memory difficulty since then. He denies depression but tested positive on depression screen and score 28/30 on MMSE today. He is not have Dilantin level checked again. He denies dizziness, vertigo, sleepiness or gait ataxia.   UPDATE 03/24/13 (LL): Since last office visit, patient had a witnessed seizure 02/22/13 that  occurred prior to arrival to ER. Patient's wife witnessed a seizure that lasted about 5 minutes. She reports generalized jerking movements. She denies head trauma but does report the patient bit his tongue. No aggravating/allevaiting factors. No associated symptoms. He had another similar seizure on 12/17/12. He is currently taking Dilantin extended release 100 mg, one twice daily on Tuesday, Thursday, Saturday and Sunday. One capsule 3 times daily on Monday, Wednesday, and Friday. Also Vimpat 100 mg tablets, one in the morning, and 2 at night. Wife reports that his memory has been getting much worse over the course of the year. Sometimes now he even forgets that he has eaten 30 minutes after finishing. He looks to his wife to answer for him when questioned about time, date, location. MMSE at last visit was 24/30 with deficits in orientation and, recall, handwriting. TSH, homocystine, vitamin B12, RPR were all checked at last visit and normal.   Update 06/08/2013 (PS):  He returns for f/u after last visit 03/24/13 with Charlott Holler, NP.  He had another seizure in setting of UTI on 05/11/13 and was seen in Er and asked to increase Vimpat dose to 100 mg am and 200 mg hs but wife did not do so due to fear of cognitive side effects as he is still having memory difficulties which began since starting Vimpat. He was started on depakote during last visit but has trouble swallowing the tablets and wife is requesting liquid form if possible.He had EEG done on 04/11/13 which showed focal right hemispheric slowing without definite epileptiform features.   UPDATE 08/17/13 (LL):  He returns for f/u after last visit on 06/08/13 with Dr. Leonie Man.  Wife reports  progressive decline in cognition and is slow to respond.  He has been progressively weaker and having frequent falls.  Wife is having increasing difficulty with his care; she works full time.  If he falls, it is difficult to get him up. He is having increasing difficulty assessing  spatial relationships and judging distances.  He has fallen 3 times recently.  He has not had any more seizures since last visit.  REVIEW OF SYSTEMS: Full 14 system review of systems performed and notable only for: confusion, joint swelling, memory loss, sleepiness and allergies   ALLERGIES: No Known Allergies  HOME MEDICATIONS: Outpatient Prescriptions Prior to Visit  Medication Sig Dispense Refill  . amLODipine (NORVASC) 10 MG tablet TAKE 1 TABLET BY MOUTH DAILY WITH BREAKFAST  30 tablet  5  . aspirin EC 81 MG tablet Take 81 mg by mouth daily with breakfast.      . irbesartan (AVAPRO) 150 MG tablet Take 150 mg by mouth daily with breakfast.      . irbesartan (AVAPRO) 150 MG tablet TAKE 1 TABLET BY MOUTH DAILY  30 tablet  5  . Lacosamide (VIMPAT) 100 MG TABS PT TAKES 1 TABLET EVERY MORNING AND 1 TABLET AT BEDTIME  60 tablet  5  . nebivolol (BYSTOLIC) 10 MG tablet Take 10 mg by mouth daily with breakfast.      . phenytoin (DILANTIN) 100 MG ER capsule Take 1 capsule (100 mg total) by mouth 3 (three) times daily.  270 capsule  0  . Valproic Acid (DEPAKENE) 250 MG/5ML SYRP syrup Take 15 mLs (750 mg total) by mouth 2 (two) times daily.  600 mL  3   No facility-administered medications prior to visit.    PAST MEDICAL HISTORY: Past Medical History  Diagnosis Date  . Hypertension   . Allergy     RHINITIS  . Diverticulosis   . Colonic polyp   . 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 Seizures     PAST SURGICAL HISTORY: No past surgical history on file.  FAMILY HISTORY: Family History  Problem Relation Age of Onset  . Cancer Mother   . Arthritis Mother   . Heart disease Mother   . Prostate cancer Father   . Cancer Sister   . Colon cancer Neg Hx   . Rectal cancer Neg Hx   . Stomach cancer Neg Hx   . Stroke Brother     massive stroke     SOCIAL HISTORY: History   Social History  . Marital Status: Married    Spouse Name: Britta Mccreedy    Number of  Children: 1  . Years of Education: college   Occupational History  . retired [employer]   Social History Main Topics  . Smoking status: Never Smoker   . Smokeless tobacco: Never Used  . Alcohol Use: No  . Drug Use: No  . Sexual Activity: Yes   Other Topics Concern  . Not on file   Social History Narrative   Patient lives at home with his wife  has 2 years of college and 1 child.    Patient works at  [employer]  Patient denies smoking tobacco,drinking alcohol and taking all illict drugs.    Patient is a caffeine drinker.  Patient is right handed.     PHYSICAL EXAM  Filed Vitals:   08/17/13 1437  BP: 158/93  Pulse: 55   Cannot calculate BMI with a height equal to zero.  Generalized: In  no acute distress, pleasant African American male.  Neck: Supple, no carotid bruits  Cardiac: Regular rate rhythm, no murmur  Pulmonary: Clear to auscultation bilaterally  Musculoskeletal: Left foot drop   Neurological examination  Mentation: Alert oriented to time, place, history taking, language fluent, and causual conversation.MMSE 18/30 with decrease orientation, recall.Clock drawing 1/4. AFT 5.  Cranial nerve II-XII: Pupils were equal round reactive to light extraocular movements were full, visual fields show partial left field cut to confrontational testing. facial sensation and strength were normal. hearing was intact to finger rubbing bilaterally. Uvula tongue midline. head turning and shoulder shrug and were normal and symmetric.Tongue protrusion into cheek strength was normal.  MOTOR: Reveals no upper or lower extremity drift. Symmetric and equal strength in all 4 extremities except 4/5 left upper extremity, hip flexor, left ankle. No focal weakness. Diminished fine motor to left hand. Right hand orbits left hand.  SENSORY: Touch and pinprick sensations are normal except diminished sensation on left upper extremity and mild on left lower stomach.  COORDINATION: Normal except  for mild dysmetria to left finger to finger and finger to nose.  REFLEXES: Deep tendon reflexes are 2+ symmetric except more brisk on left upper and lower extremity.  GAIT/STATION: Steady gait with left foot dragging slightly improved with 3-prong cane, unable to do tandem walking    DIAGNOSTIC DATA (LABS, IMAGING, TESTING) - I reviewed patient records, labs, notes, testing and imaging myself where available.  Lab Results  Component Value Date   WBC 11.6* 02/22/2013   HGB 17.4* 02/22/2013   HCT 52.2* 02/22/2013   MCV 83.7 02/22/2013   PLT 216 02/22/2013      Component Value Date/Time   NA 139 05/12/2013 2155   K 3.7 05/12/2013 2155   CL 102 05/12/2013 2155   CO2 32 05/12/2013 2155   GLUCOSE 128* 05/12/2013 2155   BUN 13 05/12/2013 2155   CREATININE 1.29 05/12/2013 2155   CREATININE 1.37* 05/15/2011 1154   CALCIUM 9.4 05/12/2013 2155   PROT 7.6 02/19/2012 1325   ALBUMIN 3.8 02/19/2012 1325   AST 16 02/19/2012 1325   ALT 16 02/19/2012 1325   ALKPHOS 63 02/19/2012 1325   BILITOT 0.4 02/19/2012 1325   GFRNONAA 58* 05/12/2013 2155   GFRAA 68* 05/12/2013 2155   Lab Results  Component Value Date   CHOL 139 12/07/2011   HDL 52 12/07/2011   LDLCALC 75 12/07/2011   TRIG 59 12/07/2011   CHOLHDL 2.7 12/07/2011   Lab Results  Component Value Date   HGBA1C 6.4* 12/07/2011   No results found for this basename: VITAMINB12   Lab Results  Component Value Date   TSH 0.292* 12/07/2011   No results found for this basename: ESRSEDRATE   Carotid duplex 02/27/2013  Preliminary report: Bilateral: Less than 39% ICA stenosis. Vertebral artery flow is antegrade. Left vertebral waveform is pulsatile with no diastolic component.  11/05/12 MRI brain with and without contrast:  This MRI scan of the brain shows old right posterior cerebral, left superior cerebellar and right pontine and left cerebellar lacunar infarcts of remote age. There are moderate changes of chronic microvascular disease and severe changes of chronic paranasal  sinus inflammation and bilateral maxillary antrum mucous retention cyst/polyps. No enhancing lesions are noted.   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 Vascular risk factors include hypertension, elevated hemoglobin A1c. Also with 1 episode of involuntary head movement with incontinence and tongue biting likely generalized seizure secondary to cerebral infarct. Had another seizure on 07/16/2012, and another breakthrough seizure on 09/04/2012 and on 05/11/13 in setting of UTI. Additional seizures on 12/17/2012,  02/23/2013 and 05/11/13.   New complaints of memory loss since the seizures, memory panel labs were normal. Increased Vimpat may be contributing to memory loss vs. Silent seizures.  MRI scan of the brain showed no new stroke.   Wife reports progressive decline in cognition and is slow to respond.  He has been progressively weaker and having frequent falls.  PLAN:  I had long discussion with patient and wife regarding patient's condition and likelihood that patient will need 24 hour care soon or nursing home placement due to his deconditioning and care needs. Check CBC, CMP, LFTs, ammonia, Phenytoin level, Valproic Acid level and UA. Physical Therapy for Frequent Falls/Deconditioning.  Continue ASA 81 mg daily for secondary stroke prevention. Maintain strict blood pressure control cold blood pressure below 130/80. Continue Depakene syrup and takes 500 mL twice daily for seizures. Continue Dilantin and Vimpat and the current dosages.  May consider reducing Vimpat when he has been seizure-free for several months as it may be contributing to his memory loss.  Return for followup in 3 months  with Larita Fife, NP or call earlier if necessary.   Orders Placed This Encounter  Procedures  . Phenytoin Level, Total  . CBC  . Hepatic Function Panel  . Valproic Acid Level  . Urinalysis with Reflex Microscopic  . Ammonia  . Ambulatory referral to Physical Therapy   Return in about 3 months (around 11/15/2013).  Ronal Fear, MSN, NP-C 08/17/2013, 4:51 PM [facility] [address], [address] [place], [place] [address] [phone]  Note: This document was prepared with digital dictation and possible smart phrase technology. Any transcriptional errors that result from this process are unintentional.

## 2013-08-17 NOTE — Patient Instructions (Signed)
We are going to check lab work today for any signs of infection or elevated levels of ammonia or seizure medications.  I am ordering Physical Therapy to build up strength due to weakness and frequent falls.  You may need to look into getting more assistance in the home or available rehab options.  Follow up in 3 months, sooner as needed.

## 2013-08-18 ENCOUNTER — Other Ambulatory Visit: Payer: Self-pay | Admitting: Nurse Practitioner

## 2013-08-18 LAB — CBC
HCT: 45.6 % (ref 37.5–51.0)
Hemoglobin: 15.2 g/dL (ref 12.6–17.7)
MCH: 28.6 pg (ref 26.6–33.0)
MCHC: 33.3 g/dL (ref 31.5–35.7)
MCV: 86 fL (ref 79–97)
Platelets: 210 10*3/uL (ref 150–379)
RBC: 5.31 x10E6/uL (ref 4.14–5.80)
RDW: 13.9 % (ref 12.3–15.4)
WBC: 5.1 10*3/uL (ref 3.4–10.8)

## 2013-08-18 LAB — HEPATIC FUNCTION PANEL
ALT: 25 IU/L (ref 0–44)
AST: 19 IU/L (ref 0–40)
Albumin: 3.9 g/dL (ref 3.6–4.8)
Alkaline Phosphatase: 71 IU/L (ref 39–117)
Bilirubin, Direct: 0.06 mg/dL (ref 0.00–0.40)
Total Bilirubin: <0.2 mg/dL (ref 0.0–1.2)
Total Protein: 6.5 g/dL (ref 6.0–8.5)

## 2013-08-18 LAB — URINALYSIS, ROUTINE W REFLEX MICROSCOPIC
Bilirubin, UA: NEGATIVE
Glucose, UA: NEGATIVE
Ketones, UA: NEGATIVE
Leukocytes, UA: NEGATIVE
Nitrite, UA: NEGATIVE
Protein, UA: NEGATIVE
RBC, UA: NEGATIVE
Specific Gravity, UA: 1.02 (ref 1.005–1.030)
Urobilinogen, Ur: 0.2 mg/dL (ref 0.0–1.9)
pH, UA: 7 (ref 5.0–7.5)

## 2013-08-18 LAB — AMMONIA: Ammonia: 94 ug/dL (ref 27–102)

## 2013-08-18 LAB — PHENYTOIN LEVEL, TOTAL: Phenytoin Lvl: 18.9 ug/mL (ref 10.0–20.0)

## 2013-08-18 LAB — VALPROIC ACID LEVEL: Valproic Acid Lvl: 57 ug/mL (ref 50–100)

## 2013-08-18 NOTE — Telephone Encounter (Signed)
Rx faxed to [facility] at [phone].

## 2013-09-10 ENCOUNTER — Ambulatory Visit: Payer: BC Managed Care – PPO | Admitting: Nurse Practitioner

## 2013-09-11 ENCOUNTER — Telehealth: Payer: Self-pay | Admitting: Neurology

## 2013-09-11 ENCOUNTER — Ambulatory Visit: Payer: BC Managed Care – PPO | Attending: Nurse Practitioner | Admitting: Physical Therapy

## 2013-09-11 DIAGNOSIS — IMO0001 Reserved for inherently not codable concepts without codable children: Secondary | ICD-10-CM | POA: Insufficient documentation

## 2013-09-11 DIAGNOSIS — R262 Difficulty in walking, not elsewhere classified: Secondary | ICD-10-CM | POA: Insufficient documentation

## 2013-09-11 DIAGNOSIS — G40209 Localization-related (focal) (partial) symptomatic epilepsy and epileptic syndromes with complex partial seizures, not intractable, without status epilepticus: Secondary | ICD-10-CM | POA: Insufficient documentation

## 2013-09-11 NOTE — Telephone Encounter (Signed)
Patient's wife calling with a question about Vimpat and Phenytoin medications. Patient for past 3 nights has been having trouble swallowing them and patient's wife is wondering if it is ok to open the capsules and mix the medication in with applesauce or something like that, or if there is a liquid that he can take instead. Please call the patient's wife.

## 2013-09-14 ENCOUNTER — Other Ambulatory Visit: Payer: Self-pay | Admitting: Nurse Practitioner

## 2013-09-14 ENCOUNTER — Ambulatory Visit: Payer: BC Managed Care – PPO | Admitting: Physical Therapy

## 2013-09-14 MED ORDER — LACOSAMIDE 10 MG/ML PO SOLN: 10.0000 mL | Freq: Two times a day (BID) | ORAL | Status: DC

## 2013-09-14 MED ORDER — PHENYTOIN 100 MG/4ML PO SUSP: 100.0000 mg | Freq: Three times a day (TID) | ORAL | Status: DC

## 2013-09-14 NOTE — Telephone Encounter (Signed)
Ok to switch to liquid forms if available

## 2013-09-14 NOTE — Telephone Encounter (Signed)
Unable to take medication

## 2013-09-14 NOTE — Telephone Encounter (Signed)
Dr. Leonie Man said to change to liquid forms.  I am faxing liquid versons of the prescriptions to [facility]. Please notify wife.

## 2013-09-14 NOTE — Telephone Encounter (Signed)
Patient's wife calling again stating she hasn't heard back and her husband has not been able to take the medication so he has been missing doses. Please call her back.

## 2013-09-14 NOTE — Telephone Encounter (Signed)
I called and spoke with Craig [name].  She is aware.

## 2013-09-16 ENCOUNTER — Ambulatory Visit: Payer: BC Managed Care – PPO | Admitting: Physical Therapy

## 2013-09-21 ENCOUNTER — Ambulatory Visit (INDEPENDENT_AMBULATORY_CARE_PROVIDER_SITE_OTHER): Payer: BC Managed Care – PPO | Admitting: Family Medicine

## 2013-09-21 ENCOUNTER — Encounter: Payer: Self-pay | Admitting: Family Medicine

## 2013-09-21 VITALS — BP 126/84 | HR 78 | Wt 186.0 lb

## 2013-09-21 DIAGNOSIS — R609 Edema, unspecified: Secondary | ICD-10-CM

## 2013-09-21 NOTE — Progress Notes (Signed)
   Subjective:    Patient ID: Newel Oien, male    DOB: 1950/10/15, 63 y.o.   MRN: [id number]  HPI He is brought in by his wife for concern over swelling of the left foot. She also has concerns over his left thumb. He did fall however noted no pain with a fall which was approximately one month ago. His only complaint is itching on the heel of foot that tends to bother him more in the morning.   Review of Systems     Objective:   Physical Exam Alert and in no distress. Exam of the left hand shows no palpable pain, swelling, ecchymosis. Normal sensation. Exam of the left foot does show diffuse 1+ edema with no tenderness to palpation and normal motion of the ankle.       Assessment & Plan:  Dependent edema  explained that I found nothing wrong with the thumb or the foot other than dependent edema which is probably related to his CVA and lack of motor function. Recommended elevating the foot as much is possible.

## 2013-09-22 ENCOUNTER — Ambulatory Visit: Payer: BC Managed Care – PPO | Admitting: Physical Therapy

## 2013-09-23 ENCOUNTER — Ambulatory Visit: Payer: BC Managed Care – PPO | Admitting: Physical Therapy

## 2013-09-28 ENCOUNTER — Ambulatory Visit: Payer: BC Managed Care – PPO | Admitting: Physical Therapy

## 2013-10-01 ENCOUNTER — Ambulatory Visit: Payer: BC Managed Care – PPO | Admitting: Physical Therapy

## 2013-10-05 ENCOUNTER — Ambulatory Visit: Payer: BC Managed Care – PPO | Attending: Nurse Practitioner | Admitting: Physical Therapy

## 2013-10-05 DIAGNOSIS — R262 Difficulty in walking, not elsewhere classified: Secondary | ICD-10-CM | POA: Insufficient documentation

## 2013-10-05 DIAGNOSIS — G40209 Localization-related (focal) (partial) symptomatic epilepsy and epileptic syndromes with complex partial seizures, not intractable, without status epilepticus: Secondary | ICD-10-CM | POA: Insufficient documentation

## 2013-10-05 DIAGNOSIS — IMO0001 Reserved for inherently not codable concepts without codable children: Secondary | ICD-10-CM | POA: Insufficient documentation

## 2013-10-06 ENCOUNTER — Encounter: Payer: Self-pay | Admitting: Family Medicine

## 2013-10-06 ENCOUNTER — Ambulatory Visit (INDEPENDENT_AMBULATORY_CARE_PROVIDER_SITE_OTHER): Payer: BC Managed Care – PPO | Admitting: Family Medicine

## 2013-10-06 VITALS — BP 160/108 | HR 65

## 2013-10-06 DIAGNOSIS — M545 Low back pain, unspecified: Secondary | ICD-10-CM

## 2013-10-06 DIAGNOSIS — I6350 Cerebral infarction due to unspecified occlusion or stenosis of unspecified cerebral artery: Secondary | ICD-10-CM

## 2013-10-06 DIAGNOSIS — I639 Cerebral infarction, unspecified: Secondary | ICD-10-CM

## 2013-10-06 NOTE — Patient Instructions (Addendum)
Continue physical therapy. Use a pillow under the knees at night to see if that will help. Also live on his side at night to see if that will help. Find out what kind of Cushion would work the best to help prevent pressure sores

## 2013-10-06 NOTE — Progress Notes (Signed)
   Subjective:    Patient ID: Craig [name], male    DOB: 04-01-1951, 63 y.o.   MRN: [id number]  HPI He is here for consult of low back pain. This causes him mainly when he lies down at night. This is been going on for several weeks. He is starting back in physical therapy to help with mobility. Was using a 4 legged cane but is having difficulty using it due to instability. He also complains of some pain with sitting for long period of time in his wheelchair. There is also question of itching on the left heel.   Review of Systems     Objective:   Physical Exam Alert and in no distress. No tenderness palpation of his low back although difficult to evaluate due to sitting in a wheelchair. Negative straight leg raising. Exam of his left foot shows no obvious lesions specifically no evidence of pressure sore. His posture is very poor with forward sloping.       Assessment & Plan:  CVA (cerebral infarction)  Low back pain  I explained that I think physical therapy will help a lot with his back. He does demonstrate very poor posture. Also recommended putting a pillow under his knees at night to see if that will help with his back. Followup here as needed.

## 2013-10-07 ENCOUNTER — Telehealth: Payer: Self-pay | Admitting: Neurology

## 2013-10-07 ENCOUNTER — Ambulatory Visit: Payer: BC Managed Care – PPO | Admitting: Physical Therapy

## 2013-10-07 DIAGNOSIS — R569 Unspecified convulsions: Secondary | ICD-10-CM

## 2013-10-07 NOTE — Telephone Encounter (Signed)
Mrs. Millett called stating that for the past 2 weeks Craig [name] has been confused and last night and this morning he is dizzy.  These symptoms began when his seizure meds were switched to liquid form.  He is scheduled to take his meds at 9:00 but she is holding off until she hears from someone here.  Please call.

## 2013-10-07 NOTE — Telephone Encounter (Signed)
I called and spoke to wife.   No change in med dosing for seizure medications. He has been consistent with taking his meds since change in oral form (liquids).  Due to confusion, and dizziness, per Charlott Holler, NP will have labs drawn to check levels and I instructed to come in tomorrow am for trough levels, bring medication along with them to give afterwards.  I instructed for him for hydrate, may help with dizziness, if this cause.  Bp 141/61 she said.   Noted high Bp in pcp office yesterday.  Wife verbalized understanding.

## 2013-10-08 ENCOUNTER — Other Ambulatory Visit: Payer: Self-pay | Admitting: *Deleted

## 2013-10-08 ENCOUNTER — Other Ambulatory Visit (INDEPENDENT_AMBULATORY_CARE_PROVIDER_SITE_OTHER): Payer: Self-pay

## 2013-10-08 DIAGNOSIS — Z0289 Encounter for other administrative examinations: Secondary | ICD-10-CM

## 2013-10-08 DIAGNOSIS — R569 Unspecified convulsions: Secondary | ICD-10-CM

## 2013-10-08 NOTE — Addendum Note (Signed)
Addended byOliver Hum on: 10/08/2013 10:39 AM   Modules accepted: Orders

## 2013-10-10 LAB — VALPROIC ACID LEVEL: Valproic Acid Lvl: 46 ug/mL — ABNORMAL LOW (ref 50–100)

## 2013-10-10 LAB — LACOSAMIDE: Lacosamide: 2 ug/mL — ABNORMAL LOW (ref 5.0–10.0)

## 2013-10-10 LAB — PHENYTOIN LEVEL, TOTAL: Phenytoin Lvl: 28.7 ug/mL (ref 10.0–20.0)

## 2013-10-10 LAB — AMMONIA: Ammonia: 87 ug/dL (ref 27–102)

## 2013-10-12 ENCOUNTER — Ambulatory Visit: Payer: BC Managed Care – PPO | Admitting: Physical Therapy

## 2013-10-14 ENCOUNTER — Ambulatory Visit: Payer: BC Managed Care – PPO | Admitting: Physical Therapy

## 2013-10-16 ENCOUNTER — Other Ambulatory Visit: Payer: Self-pay | Admitting: Nurse Practitioner

## 2013-10-19 ENCOUNTER — Ambulatory Visit: Payer: BC Managed Care – PPO | Admitting: Physical Therapy

## 2013-10-19 NOTE — Telephone Encounter (Signed)
I spoke with pharmacy, they already have refills on file that we sent last month.

## 2013-10-21 ENCOUNTER — Ambulatory Visit: Payer: BC Managed Care – PPO | Admitting: Physical Therapy

## 2013-10-22 ENCOUNTER — Telehealth: Payer: Self-pay | Admitting: Nurse Practitioner

## 2013-10-22 MED ORDER — PHENYTOIN 100 MG/4ML PO SUSP: 100.0000 mg | Freq: Two times a day (BID) | ORAL | Status: DC

## 2013-10-22 NOTE — Telephone Encounter (Signed)
Called Craig [name] to have her decrease her husband's Phenytoin to 4 ml BID from 4 ml TID because his last Phenytoin level was elevated.  She states that he has really started to go downhill in the last 3 months and that she is looking into nursing home placement.  Often times he will spit his medications out; he does not like the taste.  She states they do the best that they can do.  I asked that she call our office if she has any concerns or questions, she agreed.

## 2013-10-27 ENCOUNTER — Ambulatory Visit: Payer: BC Managed Care – PPO | Admitting: Physical Therapy

## 2013-10-29 ENCOUNTER — Ambulatory Visit: Payer: BC Managed Care – PPO | Admitting: Physical Therapy

## 2013-11-08 ENCOUNTER — Encounter (HOSPITAL_COMMUNITY): Payer: Self-pay | Admitting: Emergency Medicine

## 2013-11-08 ENCOUNTER — Emergency Department (HOSPITAL_COMMUNITY)
Admission: EM | Admit: 2013-11-08 | Discharge: 2013-11-09 | Disposition: A | Discharge status: Home / Self Care | Payer: BC Managed Care – PPO | Attending: Emergency Medicine | Admitting: Emergency Medicine

## 2013-11-08 DIAGNOSIS — Z860100 Personal history of colon polyps, unspecified: Secondary | ICD-10-CM | POA: Insufficient documentation

## 2013-11-08 DIAGNOSIS — G40909 Epilepsy, unspecified, not intractable, without status epilepticus: Secondary | ICD-10-CM | POA: Insufficient documentation

## 2013-11-08 DIAGNOSIS — R7889 Finding of other specified substances, not normally found in blood: Secondary | ICD-10-CM

## 2013-11-08 DIAGNOSIS — R569 Unspecified convulsions: Secondary | ICD-10-CM

## 2013-11-08 DIAGNOSIS — Z8673 Personal history of transient ischemic attack (TIA), and cerebral infarction without residual deficits: Secondary | ICD-10-CM | POA: Insufficient documentation

## 2013-11-08 DIAGNOSIS — R892 Abnormal level of other drugs, medicaments and biological substances in specimens from other organs, systems and tissues: Secondary | ICD-10-CM | POA: Insufficient documentation

## 2013-11-08 DIAGNOSIS — Z7982 Long term (current) use of aspirin: Secondary | ICD-10-CM | POA: Insufficient documentation

## 2013-11-08 DIAGNOSIS — Z79899 Other long term (current) drug therapy: Secondary | ICD-10-CM | POA: Insufficient documentation

## 2013-11-08 DIAGNOSIS — Z8719 Personal history of other diseases of the digestive system: Secondary | ICD-10-CM | POA: Insufficient documentation

## 2013-11-08 DIAGNOSIS — I10 Essential (primary) hypertension: Secondary | ICD-10-CM | POA: Insufficient documentation

## 2013-11-08 NOTE — ED Notes (Signed)
Pt arrived to the ED with a witnessed seizure.  Pt has a history of said.  Pt also has had a stroke in 2013.  Pt's caregiver stated that it took a long time for the pt to regain his speech but when he did it was normal.  Pt had taken his seizure medication at 2130 hrs.  Pt has previous left side weakness and vision loss.

## 2013-11-09 ENCOUNTER — Emergency Department (HOSPITAL_COMMUNITY): Payer: BC Managed Care – PPO

## 2013-11-09 LAB — URINALYSIS, ROUTINE W REFLEX MICROSCOPIC
BILIRUBIN URINE: NEGATIVE
GLUCOSE, UA: NEGATIVE mg/dL
Hgb urine dipstick: NEGATIVE
KETONES UR: NEGATIVE mg/dL
LEUKOCYTES UA: NEGATIVE
Nitrite: NEGATIVE
Protein, ur: 100 mg/dL — AB
Specific Gravity, Urine: 1.031 — ABNORMAL HIGH (ref 1.005–1.030)
Urobilinogen, UA: 1 mg/dL (ref 0.0–1.0)
pH: 5.5 (ref 5.0–8.0)

## 2013-11-09 LAB — CBC WITH DIFFERENTIAL/PLATELET
BASOS PCT: 0 % (ref 0–1)
Basophils Absolute: 0 10*3/uL (ref 0.0–0.1)
EOS PCT: 4 % (ref 0–5)
Eosinophils Absolute: 0.3 10*3/uL (ref 0.0–0.7)
HEMATOCRIT: 40.5 % (ref 39.0–52.0)
Hemoglobin: 13.1 g/dL (ref 13.0–17.0)
LYMPHS PCT: 13 % (ref 12–46)
Lymphs Abs: 0.8 10*3/uL (ref 0.7–4.0)
MCH: 28.8 pg (ref 26.0–34.0)
MCHC: 32.3 g/dL (ref 30.0–36.0)
MCV: 89 fL (ref 78.0–100.0)
MONO ABS: 0.9 10*3/uL (ref 0.1–1.0)
Monocytes Relative: 14 % — ABNORMAL HIGH (ref 3–12)
Neutro Abs: 4.3 10*3/uL (ref 1.7–7.7)
Neutrophils Relative %: 68 % (ref 43–77)
Platelets: 213 10*3/uL (ref 150–400)
RBC: 4.55 MIL/uL (ref 4.22–5.81)
RDW: 12.9 % (ref 11.5–15.5)
WBC: 6.3 10*3/uL (ref 4.0–10.5)

## 2013-11-09 LAB — I-STAT CHEM 8, ED
BUN: 10 mg/dL (ref 6–23)
CALCIUM ION: 1.21 mmol/L (ref 1.13–1.30)
Chloride: 102 mEq/L (ref 96–112)
Creatinine, Ser: 1 mg/dL (ref 0.50–1.35)
Glucose, Bld: 104 mg/dL — ABNORMAL HIGH (ref 70–99)
HCT: 40 % (ref 39.0–52.0)
Hemoglobin: 13.6 g/dL (ref 13.0–17.0)
Potassium: 4 mEq/L (ref 3.7–5.3)
Sodium: 144 mEq/L (ref 137–147)
TCO2: 32 mmol/L (ref 0–100)

## 2013-11-09 LAB — PHENYTOIN LEVEL, TOTAL: Phenytoin Lvl: <2.5 ug/mL — ABNORMAL LOW (ref 10.0–20.0)

## 2013-11-09 LAB — VALPROIC ACID LEVEL: Valproic Acid Lvl: 54.3 ug/mL (ref 50.0–100.0)

## 2013-11-09 LAB — URINE MICROSCOPIC-ADD ON

## 2013-11-09 MED ORDER — SODIUM CHLORIDE 0.9 % IV SOLN
1000.0000 mg | Freq: Once | INTRAVENOUS | Status: DC
  Filled 2013-11-09: qty 20

## 2013-11-09 MED ORDER — SODIUM CHLORIDE 0.9 % IV SOLN
500.0000 mg | Freq: Once | INTRAVENOUS | Status: AC
  Administered 2013-11-09: 500 mg via INTRAVENOUS
  Filled 2013-11-09: qty 10

## 2013-11-09 MED ORDER — PHENYTOIN SODIUM 50 MG/ML IJ SOLN: 500.0000 mg | Freq: Once | INTRAMUSCULAR | Status: DC

## 2013-11-09 NOTE — ED Provider Notes (Signed)
Medical screening examination/treatment/procedure(s) were performed by non-physician practitioner and as supervising physician I was immediately available for consultation/collaboration.   EKG Interpretation   Date/Time:  Monday November 09 2013 00:18:50 EDT Ventricular Rate:  70 PR Interval:  177 QRS Duration: 91 QT Interval:  384 QTC Calculation: 414 R Axis:   -5 Text Interpretation:  Sinus rhythm Left ventricular hypertrophy Confirmed  by Sharol Given  MD, Shimon Trowbridge ([id number]) on 11/09/2013 3:50:32 AM       Kalman Drape, MD 11/09/13 [phone]

## 2013-11-09 NOTE — ED Provider Notes (Signed)
CSN: [id number]     Arrival date & time 11/08/13  2343 History   First MD Initiated Contact with Patient 11/09/13 0051     Chief Complaint  Patient presents with  . Seizures     (Consider location/radiation/quality/duration/timing/severity/associated sxs/prior Treatment) HPI Comments: Is with a history of right-sided CVA with left-sided hemiparesis, and consequent seizures.  Tonight, had a seizure.  That lasted approximately 5 minutes, with a prolonged post ictal phase lasting 15-20 minutes.  Wife reports that he had his normal.  Facial twitching, and left arm twitching. On arrival to the emergency room the patient is back to his baseline.  He, states he has no aura.  He is compliant with his medication.  He is not having headaches.  At this time.  Wife is concerned, that he may have had a new stroke to to the prolonged post ictal phase  Patient is a 63 y.o. male presenting with seizures.  Seizures Seizure activity on arrival: no   Seizure type:  Focal Preceding symptoms: no sensation of an aura present, no dizziness, no euphoria, no headache, no hyperventilation, no nausea, no numbness, no panic and no vision change   Initial focality:  Left-sided Episode characteristics: confusion and focal shaking   Postictal symptoms: confusion   Return to baseline: yes   Severity:  Moderate Duration:  5 minutes Timing:  Once Number of seizures this episode:  1 Progression:  Resolved Context: not change in medication and medical compliance   Recent head injury:  No recent head injuries PTA treatment:  None History of seizures: yes     Past Medical History  Diagnosis Date  . Hypertension   . Allergy     RHINITIS  . Diverticulosis   . Colonic polyp   . 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 Seizures    History reviewed. No pertinent past surgical history. Family History  Problem Relation Age of Onset  . Cancer Mother   . Arthritis Mother   . Heart disease  Mother   . Prostate cancer Father   . Cancer Sister   . Colon cancer Neg Hx   . Rectal cancer Neg Hx   . Stomach cancer Neg Hx   . Stroke Brother     massive stroke    History  Substance Use Topics  . Smoking status: Never Smoker   . Smokeless tobacco: Never Used  . Alcohol Use: No    Review of Systems  Constitutional: Negative for fever and chills.  HENT: Negative for rhinorrhea.   Respiratory: Negative for cough and shortness of breath.   Gastrointestinal: Negative for nausea, vomiting and diarrhea.  Musculoskeletal: Negative for myalgias.  Skin: Negative for rash.  Neurological: Positive for seizures. Negative for dizziness and headaches.  All other systems reviewed and are negative.      Allergies  Review of patient's allergies indicates no known allergies.  Home Medications   Current Outpatient Rx  Name  Route  Sig  Dispense  Refill  . amLODipine (NORVASC) 10 MG tablet      TAKE 1 TABLET BY MOUTH DAILY WITH BREAKFAST   30 tablet   5   . aspirin EC 81 MG tablet   Oral   Take 81 mg by mouth daily with breakfast.         . irbesartan (AVAPRO) 150 MG tablet   Oral   Take 150 mg by mouth daily with breakfast.         .  nebivolol (BYSTOLIC) 10 MG tablet   Oral   Take 10 mg by mouth daily with breakfast.         . phenytoin (DILANTIN) 125 MG/5ML suspension   Oral   Take 100 mg by mouth 2 (two) times daily.         . Valproic Acid (DEPAKENE) 250 MG/5ML SYRP syrup   Oral   Take 10 mLs (500 mg total) by mouth 2 (two) times daily.   600 mL   3   . VIMPAT 10 MG/ML SOLN   Oral   Take 10 mLs by mouth 2 (two) times daily.           BP 138/87  Pulse 61  Temp(Src) 98.3 F (36.8 C) (Oral)  Resp 19  SpO2 100% Physical Exam  Nursing note and vitals reviewed. Constitutional: He is oriented to person, place, and time. He appears well-developed and well-nourished.  HENT:  Head: Normocephalic.  Mouth/Throat: Oropharynx is clear and moist.   Eyes: Pupils are equal, round, and reactive to light.  Neck: Normal range of motion.  Cardiovascular: Normal rate and regular rhythm.   Pulmonary/Chest: Effort normal and breath sounds normal.  Abdominal: Soft.  Musculoskeletal: Normal range of motion. He exhibits no edema and no tenderness.  Neurological: He is alert and oriented to person, place, and time.  Left hemiparesis, at baseline  Skin: Skin is warm. No rash noted.    ED Course  Procedures (including critical care time) Labs Review Labs Reviewed  PHENYTOIN LEVEL, TOTAL - Abnormal; Notable for the following:    Phenytoin Lvl <2.5 (*)    All other components within normal limits  CBC WITH DIFFERENTIAL - Abnormal; Notable for the following:    Monocytes Relative 14 (*)    All other components within normal limits  URINALYSIS, ROUTINE W REFLEX MICROSCOPIC - Abnormal; Notable for the following:    Specific Gravity, Urine 1.031 (*)    Protein, ur 100 (*)    All other components within normal limits  I-STAT CHEM 8, ED - Abnormal; Notable for the following:    Glucose, Bld 104 (*)    All other components within normal limits  VALPROIC ACID LEVEL  URINE MICROSCOPIC-ADD ON   Imaging Review Ct Head Wo Contrast  11/09/2013   CLINICAL DATA:  Seizures, left-sided weakness and vision loss  EXAM: CT HEAD WITHOUT CONTRAST  TECHNIQUE: Contiguous axial images were obtained from the base of the skull through the vertex without intravenous contrast.  COMPARISON:  02/19/2012  FINDINGS: White matter changes are moderate to advanced. Right parietal occipital and temporal lobe encephalomalacia is similar prior. Right caudate head lacunar infarction is unchanged. Unchanged left cerebellar infarction on image 12. Left thalamic hypodensity on image 14 May reflect an age-indeterminate lacunar infarction. No intraparenchymal hemorrhage. No hydrocephalus. No abnormal extra-axial fluid collection. No definite CT evidence of an acute cortical based (large  artery) infarction. Opacified maxillary sinuses are incompletely imaged.  IMPRESSION: Sequelae of multifocal infarctions including right PCA distribution, left cerebellum, and age indeterminate left thalamus. No intraparenchymal hemorrhage or acute cortical based infarction.  If concern for acute ischemia persists, recommend MRI.   Electronically Signed   By: Carlos Levering M.D.   On: 11/09/2013 03:44     EKG Interpretation   Date/Time:  Monday November 09 2013 00:18:50 EDT Ventricular Rate:  70 PR Interval:  177 QRS Duration: 91 QT Interval:  384 QTC Calculation: 414 R Axis:   -5 Text Interpretation:  Sinus rhythm Left  ventricular hypertrophy Confirmed  by OTTER  MD, OLGA ([id number]) on 11/09/2013 3:50:32 AM     Patient's Dilantin level was less than 2 point, 5.  He's been given 500 mg of Dilantin IV load to followup with his neurologist for further directions as he is been having trouble regulating his vacation.  Levels.  CT scan reviewed.  There is no stroke.  Symptoms labs are all within normal limits MDM   Final diagnoses:  Seizure  Dilantin level too low        Garald Balding, NP 11/09/13 [phone]

## 2013-11-09 NOTE — Discharge Instructions (Signed)
Tonight your Dilantin level was subtherapeutic.  Is important that you take this medication on a regular basis in the appropriate doses.  Please followup with your neurologist today for further directions

## 2013-11-10 ENCOUNTER — Telehealth: Payer: Self-pay | Admitting: Nurse Practitioner

## 2013-11-10 NOTE — Telephone Encounter (Signed)
Patient's wife called wanting to make [facility] aware that the patient had a seizure on Sunday.  Wife said to give her a call if more detailed information is needed.

## 2013-11-10 NOTE — Telephone Encounter (Signed)
Patient's wife [name] calling stating that patient had an seizure on 11/08/13, went to the ED and just wanted to let [facility] know. If Jeani Hawking, NP has any other questions to call them a call. Thanks [employer]

## 2013-11-18 NOTE — Telephone Encounter (Signed)
Pt's wife called and stated that he had a seizure on 3-8.  He has lost his appetite, has some confusion and his eyes look a bit puffy, the right more so than the left. Mrs. Olivera asked for someone to give her a call to discuss.  Thank you

## 2013-11-25 MED ORDER — PHENYTOIN 50 MG PO CHEW: 100.0000 mg | CHEWABLE_TABLET | Freq: Two times a day (BID) | ORAL | Status: DC

## 2013-11-25 MED ORDER — DIVALPROEX SODIUM 125 MG PO CPSP: 500.0000 mg | ORAL_CAPSULE | Freq: Two times a day (BID) | ORAL | Status: DC

## 2013-11-25 NOTE — Telephone Encounter (Signed)
Pt's wife called. She stated that she needs to talk to someone as soon as possible. She has received conflicting information and she would like to discuss it with either Jeani Hawking or Dr. Leonie Man.  Her husband is still having issues with his appetite.  He is also having some confusion, but she is not sure whether it is related to his dementia.  She would like to know if he needs to come in for a visit or blood work.  She also stated that there are days when Craig [name] will spit out his medication saying it doesn't taste good.  She says that when they have gone to the ER after he has a seizure they give him a dilantin IV, but was told in the past that she didn't need to take him to the hospital every time he has a seizure.  She wants to know what she is supposed to do.  Please call her as soon as possible to discuss.  Thank you

## 2013-11-25 NOTE — Telephone Encounter (Signed)
Please try to schedule [name] for an acute work in tomorrow with Dr. Leonie Man.  I have seen him the last 2 visits and he must be seen by the MD next time.

## 2013-11-25 NOTE — Telephone Encounter (Signed)
I called and spoke to Mrs. Carlota Raspberry.  Mr. Karczewski will not swallow evening dose of dilantin and depakene liquid forms due to bad taste.  He had seizure on March 8 and dilantin level was undetectable.  I am switching Dilantin to crushable chew tabs and valproic acid to Depakote Sprinkles.  She thinks he will take it better this way.

## 2013-12-04 ENCOUNTER — Ambulatory Visit (INDEPENDENT_AMBULATORY_CARE_PROVIDER_SITE_OTHER): Payer: BC Managed Care – PPO | Admitting: Neurology

## 2013-12-04 ENCOUNTER — Encounter: Payer: Self-pay | Admitting: Neurology

## 2013-12-04 VITALS — BP 138/86 | HR 61 | Temp 98.7°F | Ht 69.0 in | Wt 186.0 lb

## 2013-12-04 DIAGNOSIS — G40319 Generalized idiopathic epilepsy and epileptic syndromes, intractable, without status epilepticus: Secondary | ICD-10-CM

## 2013-12-04 NOTE — Patient Instructions (Signed)
I had a long discussion with the patient and his wife regarding his seizures, memory loss, results of recent evaluation and answered questions. I recommend he change Depakote to 500 mg 3 times daily via sprinkles and continue phenytoin 100 twice daily via chewable tablets. Continue Vimpatt in the liquid form and the current dose. Return for followup in 3 months with Jeani Hawking, nurse. practitioner and check valproic acid and phenytoin level in 2 weeks.

## 2013-12-04 NOTE — Progress Notes (Signed)
PATIENT: Craig [name] DOB: 10-24-1950   REASON FOR VISIT: follow up HISTORY FROM: patient  HISTORY OF PRESENT ILLNESS: PRIOR HPI: 63 year old right-handed African American male here for two-month followup of right cerebral artery infarct and left cerebellar artery infarct secondary to occlusive intra-and extracranial disease with occlusion of the right posterior cerebral artery and occlusion of the left superior cerebellar artery compatible with areas of acute infarction on 12/07/2011. Vascular risk factors include hypertension, elevated hemoglobin A1c. He presented to [facility] after being found at home by family on the floor. He had awakened with dizziness and nausea, when going to the bathroom lost his balance and fell. He had numbness and weakness for 2 weeks prior and vision changes. MRA showed diffuse intracranial atherosclerotic disease. 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 Occlusion of the left superior cerebellar artery compatible with areas of acute infarction. 2 Vicodin carotid Doppler normal. LDL 75, hemoglobin A1c 6.4. He is now on aspirin 81 mg stopped clopidogrel recently. He continues with outpatient speech, occupational and physical therapy, doing well. Using a 3 prong cane at times and walker.   UPDATE 10/29/12 (PS): He is seen urgently today his family requested after having a seizure on 09/05/2012. He was seen at [facility] ER Dilantin level was low and he was given 1000 mg loading dose. He was given antibiotics for UTI. He called the office complaining of what he felt for seizure auras and was asked increased impact 300 mg daily from 10/21/2012 he has been having memory difficulty since then. He denies depression but tested positive on depression screen and score 28/30 on MMSE today. He is not have Dilantin level checked again. He denies dizziness, vertigo, sleepiness or gait ataxia.   UPDATE 03/24/13 (LL): Since last office visit, patient had a witnessed seizure 02/22/13 that  occurred prior to arrival to ER. Patient's wife witnessed a seizure that lasted about 5 minutes. She reports generalized jerking movements. She denies head trauma but does report the patient bit his tongue. No aggravating/allevaiting factors. No associated symptoms. He had another similar seizure on 12/17/12. He is currently taking Dilantin extended release 100 mg, one twice daily on Tuesday, Thursday, Saturday and Sunday. One capsule 3 times daily on Monday, Wednesday, and Friday. Also Vimpat 100 mg tablets, one in the morning, and 2 at night. Wife reports that his memory has been getting much worse over the course of the year. Sometimes now he even forgets that he has eaten 30 minutes after finishing. He looks to his wife to answer for him when questioned about time, date, location. MMSE at last visit was 24/30 with deficits in orientation and, recall, handwriting. TSH, homocystine, vitamin B12, RPR were all checked at last visit and normal.   Update 06/08/2013 (PS):  He returns for f/u after last visit 03/24/13 with Charlott Holler, NP.  He had another seizure in setting of UTI on 05/11/13 and was seen in Er and asked to increase Vimpat dose to 100 mg am and 200 mg hs but wife did not do so due to fear of cognitive side effects as he is still having memory difficulties which began since starting Vimpat. He was started on depakote during last visit but has trouble swallowing the tablets and wife is requesting liquid form if possible.He had EEG done on 04/11/13 which showed focal right hemispheric slowing without definite epileptiform features.   UPDATE 08/17/13 (LL):  He returns for f/u after last visit on 06/08/13 with Dr. Leonie Man.  Wife reports  progressive decline in cognition and is slow to respond.  He has been progressively weaker and having frequent falls.  Wife is having increasing difficulty with his care; she works full time.  If he falls, it is difficult to get him up. He is having increasing difficulty assessing  spatial relationships and judging distances.  He has fallen 3 times recently.  He has not had any more seizures since last visit.  UPDATE 12/04/13 : For followup of her last visit 3 months ago. Is accompanied by his wife who states that he had a single episode of seizure on 11/08/13 and was seen at [facility] emergency room. CT scan of the head showed multiple old infarcts without any acute abnormality. Lab work showed a valproic acid level of 54 and phenytoin level less than 2.5. CBC and basic chemistries unremarkable. The patient has had troubles chewing and swallowing his medications of late and hence had been switched to Depakote sprinkles and chewable Phenytoin tablets but he has not started that yet. He continues to take Vimpat in a liquid form. Patient's wife has not noticed any significant cognitive improvement after reducing the Vimpat dose from 3 times a day to twice a day. He continues to have memory difficulties and at times cannot remember family members. He has difficulty walking with a cane and now spends most of his time in a wheelchair as he tends to fall a lot. He has some intermittent agitation particularly stressed to swallow medications. He however has no trouble eating his food most of it is pured. The patient's wife has managed to arrange for some additional help at home and she has a CNA who comes for 4 hours a day and Fridays a week. She is struggling to care for him but yet wants to continue to do so as long as she can.  REVIEW OF SYSTEMS: Full 14 system review of systems performed and notable only for: confusion, joint swelling, memory loss, sleepiness and allergies   ALLERGIES: No Known Allergies  HOME MEDICATIONS: Outpatient Prescriptions Prior to Visit  Medication Sig Dispense Refill  . amLODipine (NORVASC) 10 MG tablet TAKE 1 TABLET BY MOUTH DAILY WITH BREAKFAST  30 tablet  5  . aspirin EC 81 MG tablet Take 81 mg by mouth daily with breakfast.      . divalproex (DEPAKOTE  SPRINKLES) 125 MG capsule Take 4 capsules (500 mg total) by mouth 2 (two) times daily. Open capsules and sprinkle on food.  260 capsule  5  . irbesartan (AVAPRO) 150 MG tablet Take 150 mg by mouth daily with breakfast.      . nebivolol (BYSTOLIC) 10 MG tablet Take 10 mg by mouth daily with breakfast.      . phenytoin (DILANTIN) 50 MG tablet Chew 2 tablets (100 mg total) by mouth 2 (two) times daily.  120 tablet  5  . VIMPAT 10 MG/ML SOLN Take 10 mLs by mouth 2 (two) times daily.        No facility-administered medications prior to visit.    PAST MEDICAL HISTORY: Past Medical History  Diagnosis Date  . Hypertension   . Allergy     RHINITIS  . Diverticulosis   . Colonic polyp   . 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 Seizures     PAST SURGICAL HISTORY: History reviewed. No pertinent past surgical history.  FAMILY HISTORY: Family History  Problem Relation Age of Onset  . Cancer Mother   . Arthritis Mother   .  Heart disease Mother   . Prostate cancer Father   . Cancer Sister   . Colon cancer Neg Hx   . Rectal cancer Neg Hx   . Stomach cancer Neg Hx   . Stroke Brother     massive stroke     SOCIAL HISTORY: History   Social History  . Marital Status: Married    Spouse Name: [name]    Number of Children: 1  . Years of Education: college   Occupational History  . retired [employer]   Social History Main Topics  . Smoking status: Never Smoker   . Smokeless tobacco: Never Used  . Alcohol Use: No  . Drug Use: No  . Sexual Activity: Yes   Other Topics Concern  . Not on file   Social History Narrative   Patient lives at home with his wife  has 2 years of college and 1 child.    Patient works at  [employer].  Patient denies smoking tobacco,drinking alcohol and taking all illict drugs.    Patient is a caffeine drinker.  Patient is right handed.     PHYSICAL EXAM  Filed Vitals:   12/04/13 1305  BP: 138/86  Pulse: 61  Temp:  98.7 F (37.1 C)  TempSrc: Oral  Height: 5\' 9"  (1.753 m)  Weight: 186 lb (84.369 kg)   Body mass index is 27.45 kg/(m^2).  Generalized: In no acute distress, pleasant African American male.  Neck: Supple, no carotid bruits  Cardiac: Regular rate rhythm, no murmur  Pulmonary: Clear to auscultation bilaterally  Musculoskeletal: Left foot drop   Neurological examination  Mentation: Alert oriented to time, place,  language fluent, and causual conversation.MMSE 14/30 with decrease orientation, recall.Clock drawing 0/4. AFT 5.  Cranial nerve II-XII: Pupils were equal round reactive to light extraocular movements were full, visual fields show partial left field cut to confrontational testing. facial sensation and strength were normal. hearing was intact to finger rubbing bilaterally. Uvula tongue midline. head turning and shoulder shrug and were normal and symmetric.Tongue protrusion into cheek strength was normal.  MOTOR: Reveals no upper or lower extremity drift. Symmetric and equal strength in all 4 extremities except 4/5 left upper extremity, hip flexor, left ankle. No focal weakness. Diminished fine motor to left hand. Right hand orbits left hand.  SENSORY: Touch and pinprick sensations are normal except diminished sensation on left upper extremity and mild on left lower stomach.  COORDINATION: Normal except for mild dysmetria to left finger to finger and finger to nose.  REFLEXES: Deep tendon reflexes are 2+ symmetric except more brisk on left upper and lower extremity.  GAIT/STATION: Steady gait with left foot dragging slightly improved with 3-prong cane, unable to do tandem walking    DIAGNOSTIC DATA (LABS, IMAGING, TESTING) - I reviewed patient records, labs, notes, testing and imaging myself where available.  Lab Results  Component Value Date   WBC 6.3 11/09/2013   HGB 13.6 11/09/2013   HCT 40.0 11/09/2013   MCV 89.0 11/09/2013   PLT 213 11/09/2013      Component Value Date/Time   NA  144 11/09/2013 0131   K 4.0 11/09/2013 0131   CL 102 11/09/2013 0131   CO2 32 05/12/2013 2155   GLUCOSE 104* 11/09/2013 0131   BUN 10 11/09/2013 0131   CREATININE 1.00 11/09/2013 0131   CREATININE 1.37* 05/15/2011 1154   CALCIUM 9.4 05/12/2013 2155   PROT 6.5 08/17/2013 1555   PROT 7.6 02/19/2012 1325   ALBUMIN  3.8 02/19/2012 1325   AST 19 08/17/2013 1555   ALT 25 08/17/2013 1555   ALKPHOS 71 08/17/2013 1555   BILITOT <0.2 08/17/2013 1555   GFRNONAA 58* 05/12/2013 2155   GFRAA 68* 05/12/2013 2155   Lab Results  Component Value Date   CHOL 139 12/07/2011   HDL 52 12/07/2011   LDLCALC 75 12/07/2011   TRIG 59 12/07/2011   CHOLHDL 2.7 12/07/2011   Lab Results  Component Value Date   HGBA1C 6.4* 12/07/2011   No results found for this basename: VITAMINB12   Lab Results  Component Value Date   TSH 0.292* 12/07/2011   No results found for this basename: ESRSEDRATE   Carotid duplex 02/27/2013  Preliminary report: Bilateral: Less than 39% ICA stenosis. Vertebral artery flow is antegrade. Left vertebral waveform is pulsatile with no diastolic component.  11/05/12 MRI brain with and without contrast:  This MRI scan of the brain shows old right posterior cerebral, left superior cerebellar and right pontine and left cerebellar lacunar infarcts of remote age. There are moderate changes of chronic microvascular disease and severe changes of chronic paranasal sinus inflammation and bilateral maxillary antrum mucous retention cyst/polyps. No enhancing lesions are noted.   ASSESSMENT AND PLAN  62 year old right-handed African American male with right cerebral artery infarct and left cerebellar artery infarct secondary to occlusive intra-and extracranial disease with occlusion of the right posterior cerebral artery and occlusion of the left superior cerebellar artery compatible with areas of acute infarction on 12/07/2011. Vascular risk factors include hypertension, elevated hemoglobin A1c. Also with 1 episode of involuntary  head movement with incontinence and tongue biting likely generalized seizure secondary to cerebral infarct. Had another seizure on 07/16/2012, and another breakthrough seizure on 09/04/2012 and on 05/11/13 in setting of UTI. Additional seizures on 12/17/2012,  02/23/2013 and 05/11/13.   New complaints of memory loss since the seizures, memory panel labs were normal. Increased Vimpat may be contributing to memory loss vs. Silent seizures.  MRI scan of the brain showed no new stroke.   Wife reports progressive decline in cognition and is slow to respond.  He has been progressively weaker and having frequent falls.  PLAN:  I had a long discussion with the patient and his wife regarding his seizures, memory loss, results of recent evaluation and answered questions. I recommend he change Depakote to 500 mg 3 times daily via sprinkles and continue phenytoin 100 twice daily via chewable tablets. Continue Vimpatt in the liquid form and the current dose. Return for followup in 3 months with Jeani Hawking, nurse. practitioner and check valproic acid and phenytoin level in 2 weeks.   Orders Placed This Encounter  Procedures  . Valproic Acid Level  . Phenytoin Level, Total   Return in about 3 months (around 03/05/2014).  Antony Contras, MD  12/04/2013, 2:23 PM [facility] [address] [place], [place] [address] [phone]  Note: This document was prepared with digital dictation and possible smart phrase technology. Any transcriptional errors that result from this process are unintentional.

## 2013-12-12 ENCOUNTER — Other Ambulatory Visit: Payer: Self-pay | Admitting: Family Medicine

## 2013-12-17 ENCOUNTER — Ambulatory Visit: Payer: BC Managed Care – PPO | Admitting: Nurse Practitioner

## 2013-12-18 ENCOUNTER — Ambulatory Visit (INDEPENDENT_AMBULATORY_CARE_PROVIDER_SITE_OTHER): Payer: BC Managed Care – PPO | Admitting: Family Medicine

## 2013-12-18 ENCOUNTER — Encounter: Payer: Self-pay | Admitting: Family Medicine

## 2013-12-18 VITALS — BP 120/78 | HR 60 | Wt 148.0 lb

## 2013-12-18 DIAGNOSIS — D171 Benign lipomatous neoplasm of skin and subcutaneous tissue of trunk: Secondary | ICD-10-CM

## 2013-12-18 DIAGNOSIS — D1739 Benign lipomatous neoplasm of skin and subcutaneous tissue of other sites: Secondary | ICD-10-CM

## 2013-12-18 NOTE — Progress Notes (Signed)
   Subjective:    Patient ID: Craig [name], male    DOB: 1951-04-18, 63 y.o.   MRN: [id number]  HPI He is brought in by his wife for evaluation of lesion on the lower sacral area. She recently noticed this. She also states that his weight is decreased significantly.   Review of Systems     Objective:   Physical Exam Alert and in no distress. Exam of the sacral area does show slight old this but the skin is totally normal. Is not hot red or tender.       Assessment & Plan:  Lipoma of buttock  I explained that the skin lesion is totally benign and nothing to worry about. Also discuss the discrepancies in the weight. My guess is that everybody has been taking the weighted that the wife mentioned but because he is in a wheelchair and also using a cane, adequate weight would be difficult to assess.

## 2013-12-25 ENCOUNTER — Other Ambulatory Visit (INDEPENDENT_AMBULATORY_CARE_PROVIDER_SITE_OTHER): Payer: Self-pay

## 2013-12-25 DIAGNOSIS — Z0289 Encounter for other administrative examinations: Secondary | ICD-10-CM

## 2013-12-25 LAB — PHENYTOIN LEVEL, TOTAL: Phenytoin Lvl: 7 ug/mL — ABNORMAL LOW (ref 10.0–20.0)

## 2013-12-25 LAB — VALPROIC ACID LEVEL: VALPROIC ACID LVL: 52 ug/mL (ref 50–100)

## 2013-12-25 NOTE — Addendum Note (Signed)
Addended by: Leonie Green on: 12/25/2013 10:31 AM   Modules accepted: Orders

## 2014-01-01 ENCOUNTER — Telehealth: Payer: Self-pay | Admitting: Neurology

## 2014-01-01 NOTE — Telephone Encounter (Signed)
Pt's wife called requesting someone call her concerning pt's lab work. She was advised that she could not take pt out of town until she heard from our office concerning the levels before he left. Please call [name] concerning this matter. Thanks

## 2014-01-01 NOTE — Telephone Encounter (Signed)
I spoke to the patient's wife and communicated lab results and permission to travel out of town

## 2014-01-01 NOTE — Telephone Encounter (Signed)
Valproic acid level is in desired range and dilantin level is slightly low. Patient may travel if necessary.

## 2014-01-01 NOTE — Telephone Encounter (Signed)
Pt is calling requesting lab work results. Please advise

## 2014-01-07 ENCOUNTER — Telehealth: Payer: Self-pay | Admitting: Family Medicine

## 2014-01-07 NOTE — Telephone Encounter (Signed)
Called and left message on VM with info

## 2014-01-07 NOTE — Telephone Encounter (Signed)
At this point since he has had difficulty in the past, let's just watch and see how he does. I can always see him next week if needed

## 2014-02-05 ENCOUNTER — Telehealth: Payer: Self-pay | Admitting: Nurse Practitioner

## 2014-02-05 NOTE — Telephone Encounter (Signed)
Please call spouse, Vimpat is not likely cause of lower extremity edema.  It is most likely dependent edema from inactivity. If there is warmth in the leg or redness, she should have him evaluated for DVT at her PCP.

## 2014-02-05 NOTE — Telephone Encounter (Signed)
Spouse concerned about patient experiencing Left Leg and both feet swelling.  Questioning if patient needs to come in.  Please call and advise.

## 2014-02-05 NOTE — Telephone Encounter (Signed)
Shared message with patient's wife, she verbalized understanding

## 2014-02-05 NOTE — Telephone Encounter (Signed)
Spoke with patient's wife and she said that his feet are really swollen bad, had seen pcp a month ago and he suggested that patient keep them elevated. The wife said that could the swelling be coming from the Vimpat 10mg /ml.  She thought she had read somewhere that it could cause swelling in the legs/feet.

## 2014-02-09 ENCOUNTER — Ambulatory Visit (INDEPENDENT_AMBULATORY_CARE_PROVIDER_SITE_OTHER): Payer: BC Managed Care – PPO | Admitting: Family Medicine

## 2014-02-09 ENCOUNTER — Encounter: Payer: Self-pay | Admitting: Family Medicine

## 2014-02-09 VITALS — BP 100/68

## 2014-02-09 DIAGNOSIS — R609 Edema, unspecified: Secondary | ICD-10-CM

## 2014-02-09 NOTE — Progress Notes (Signed)
   Subjective:    Patient ID: Craig [name], male    DOB: 12-25-1950, 63 y.o.   MRN: [id number]  HPI He complains of a weeklong history of increased swelling in both feet. He has had low swelling to a lesser degree for a much longer period of time. His physical activity is limited due to his CVA. He mainly uses a wheelchair.   Review of Systems     Objective:   Physical Exam Both lower extremities show 2-3+ pitting edema. Homan's sign is negative. Skin is normal.       Assessment & Plan:  Dependent edema  I explained to the reason behind the swelling and encouraged elevating his feet as much is possible as well as using support stockings. If they have further difficulty, they will call me.

## 2014-02-09 NOTE — Patient Instructions (Signed)
Use regular support hose and keep the feet elevated as much is possible

## 2014-03-01 ENCOUNTER — Telehealth: Payer: Self-pay | Admitting: Neurology

## 2014-03-01 MED ORDER — DIVALPROEX SODIUM 125 MG PO CPSP: 500.0000 mg | ORAL_CAPSULE | Freq: Three times a day (TID) | ORAL | Status: DC

## 2014-03-01 NOTE — Telephone Encounter (Signed)
Patient's wife calling--patient needs refill for Divalproex called in--Dr. Leonie Man increased dosage--patient tried to get a refill but pharmacy had old dosage and said could not be filled until 7-1--[facility] [address]--thank you.

## 2014-03-01 NOTE — Telephone Encounter (Signed)
Last OV says: I recommend he change Depakote to 500 mg 3 times daily via sprinkles  Rx has been updated and sent.

## 2014-03-10 ENCOUNTER — Ambulatory Visit (INDEPENDENT_AMBULATORY_CARE_PROVIDER_SITE_OTHER): Payer: BC Managed Care – PPO | Admitting: Nurse Practitioner

## 2014-03-10 ENCOUNTER — Encounter: Payer: Self-pay | Admitting: Nurse Practitioner

## 2014-03-10 VITALS — BP 141/89 | HR 60 | Ht 69.0 in | Wt 175.8 lb

## 2014-03-10 DIAGNOSIS — Z8673 Personal history of transient ischemic attack (TIA), and cerebral infarction without residual deficits: Secondary | ICD-10-CM

## 2014-03-10 DIAGNOSIS — G40309 Generalized idiopathic epilepsy and epileptic syndromes, not intractable, without status epilepticus: Secondary | ICD-10-CM

## 2014-03-10 DIAGNOSIS — R413 Other amnesia: Secondary | ICD-10-CM

## 2014-03-10 NOTE — Progress Notes (Signed)
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 Vascular risk factors include hypertension, elevated hemoglobin A1c. He presented to [facility] after being found at home by family on the floor. He had awakened with dizziness and nausea, when going to the bathroom lost his balance and fell. He had numbness and weakness for 2 weeks prior and vision changes. MRA showed diffuse intracranial atherosclerotic disease. 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 Occlusion of the left superior cerebellar artery compatible with areas of acute infarction. 2 Vicodin carotid Doppler normal. LDL 75, hemoglobin A1c 6.4. He is now on aspirin 81 mg stopped clopidogrel recently. He continues with outpatient speech, occupational and physical therapy, doing well. Using a 3 prong cane at times and walker.  UPDATE 10/29/12 (PS): He is seen urgently today his family requested after having a seizure on 09/05/2012. He was seen at [facility] ER Dilantin level was low and he was given 1000 mg loading dose. He was given antibiotics for UTI. He called the office complaining of what he felt for seizure auras and was asked increased impact 300 mg daily from 10/21/2012 he has been having memory difficulty since then. He denies depression but tested positive on depression screen and score 28/30 on MMSE today. He is not have Dilantin level checked again. He denies dizziness, vertigo, sleepiness or gait ataxia.  UPDATE 03/24/13 (LL): Since last office visit, patient had  a witnessed seizure 02/22/13 that occurred prior to arrival to ER. Patient's wife witnessed a seizure that lasted about 5 minutes. She reports generalized jerking movements. She denies head trauma but does report the patient bit his tongue. No aggravating/allevaiting factors. No associated symptoms. He had another similar seizure on 12/17/12. He is currently taking Dilantin extended release 100 mg, one twice daily on Tuesday, Thursday, Saturday and Sunday. One capsule 3 times daily on Monday, Wednesday, and Friday. Also Vimpat 100 mg tablets, one in the morning, and 2 at night. Wife reports that his memory has been getting much worse over the course of the year. Sometimes now he even forgets that he has eaten 30 minutes after finishing. He looks to his wife to answer for him when questioned about time, date, location. MMSE at last visit was 24/30 with deficits in orientation and, recall, handwriting. TSH, homocystine, vitamin B12, RPR were all checked at last visit and normal.  Update 06/08/2013 (PS): He returns for f/u after last visit 03/24/13 with Charlott Holler, NP. He had another seizure in setting of UTI on 05/11/13 and was seen in Er and asked to increase Vimpat dose to 100 mg am and 200 mg hs but wife did not do so due to fear of cognitive side effects as he is still having memory difficulties which began since starting Vimpat. He was started on depakote during last visit but has trouble swallowing the tablets and wife is requesting liquid form if possible.He had EEG done on 04/11/13 which showed focal right hemispheric slowing without definite epileptiform features.  UPDATE 08/17/13 (LL): He returns for f/u after last visit on 06/08/13 with Dr. Leonie Man. Wife reports progressive decline in  cognition and is slow to respond. He has been progressively weaker and having frequent falls. Wife is having increasing difficulty with his care; she works full time. If he falls, it is difficult to get him up. He is having increasing  difficulty assessing spatial relationships and judging distances. He has fallen 3 times recently. He has not had any more seizures since last visit.  UPDATE 12/04/13 : For followup of his last visit 3 months ago. Is accompanied by his wife who states that he had a single episode of seizure on 11/08/13 and was seen at [facility] emergency room. CT scan of the head showed multiple old infarcts without any acute abnormality. Lab work showed a valproic acid level of 54 and phenytoin level less than 2.5. CBC and basic chemistries unremarkable. The patient has had troubles chewing and swallowing his medications of late and hence had been switched to Depakote sprinkles and chewable Phenytoin tablets but he has not started that yet. He continues to take Vimpat in a liquid form. Patient's wife has not noticed any significant cognitive improvement after reducing the Vimpat dose from 3 times a day to twice a day. He continues to have memory difficulties and at times cannot remember family members. He has difficulty walking with a cane and now spends most of his time in a wheelchair as he tends to fall a lot. He has some intermittent agitation particularly stressed to swallow medications. He however has no trouble eating his food most of it is pured. The patient's wife has managed to arrange for some additional help at home and she has a CNA who comes for 4 hours a day and Fridays a week. She is struggling to care for him but yet wants to continue to do so as long as she can.   UPDATE 03/10/14 (LL): He returns for 3 month follow up with his wife.  He has not had any recurrent seizures since March.  His mood appears better since having a male caregiver in the home for 4 hours 5 days a week, it is a man he knew from their church. He is talkative today and appears happy.  Wife brings up him lower leg and foot swelling again, they are using compression hose at the advice of Dr. Redmond School.  The swelling is improved to me since I saw him  months ago. They are trying to elevate his legs as much as possible.  His blood pressure is well controlled, and he continues to slim down.  His wife states that for several months he did not want to eat but his appetite is better now. Last valproic acid level was 52 and Phenytoin was 7.  His wife states that he wakes 3-4 times each night and wants to sit on the side of the bed, although he has no problem going to sleep. He naps throughout the day, especially about an hour after taking his ae medications.  REVIEW OF SYSTEMS: Full 14 system review of systems performed and notable only for: foot/leg swelling, frequent waking, walking difficulty  ALLERGIES: No Known Allergies  HOME MEDICATIONS: Outpatient Prescriptions Prior to Visit  Medication Sig Dispense Refill  . amLODipine (NORVASC) 10 MG tablet TAKE 1 TABLET BY MOUTH DAILY WITH BREAKFAST  30 tablet  2  . aspirin EC 81 MG tablet Take 81 mg by mouth daily with breakfast.      . divalproex (DEPAKOTE SPRINKLES) 125 MG capsule Take 4 capsules (500 mg total) by mouth 3 (three) times daily.  Open capsules and sprinkle on food.  360 capsule  5  . irbesartan (AVAPRO) 150 MG tablet Take 150 mg by mouth daily with breakfast.      . nebivolol (BYSTOLIC) 10 MG tablet Take 10 mg by mouth daily with breakfast.      . phenytoin (DILANTIN) 50 MG tablet Chew 2 tablets (100 mg total) by mouth 2 (two) times daily.  120 tablet  5  . VIMPAT 10 MG/ML SOLN Take 10 mLs by mouth 2 (two) times daily.        No facility-administered medications prior to visit.    PHYSICAL EXAM Filed Vitals:   03/10/14 1536  BP: 141/89  Pulse: 60  Height: 5\' 9"  (1.753 m)  Weight: 175 lb 12.8 oz (79.742 kg)   Body mass index is 25.95 kg/(m^2).  Generalized: In no acute distress, pleasant African American male.  Neck: Supple, no carotid bruits  Cardiac: Regular rate rhythm, no murmur, Both lower extremities show 2+ pitting edema. Homan's sign is negative.   Pulmonary: Clear to  auscultation bilaterally  Musculoskeletal: Left foot drop   Neurological examination  Mentation: Alert oriented to time, place, language fluent, and casual conversation.  MMSE at previous visit was 14/30 with decrease orientation, recall.Clock drawing 0/4. AFT 5.  Cranial nerve II-XII: Pupils were equal round reactive to light extraocular movements were full, visual fields show partial left field cut to confrontational testing. facial sensation and strength were normal. hearing was intact to finger rubbing bilaterally. Uvula tongue midline. head turning and shoulder shrug and were normal and symmetric.Tongue protrusion into cheek strength was normal.  MOTOR: Reveals no upper or lower extremity drift. Symmetric and equal strength in all 4 extremities except 4/5 left upper extremity, hip flexor, left ankle. No focal weakness. Diminished fine motor to left hand. Right hand orbits left hand.  SENSORY: Touch and pinprick sensations are normal except diminished sensation on left upper extremity and mild on left lower stomach.  COORDINATION: Normal except for mild dysmetria to left finger to finger and finger to nose.  REFLEXES: Deep tendon reflexes are 2+ symmetric except more brisk on left upper and lower extremity.  GAIT/STATION: not tested, in wheelchair.  Carotid duplex 02/27/2013  Bilateral: Less than 39% ICA stenosis.  11/05/12 MRI brain with and without contrast:  This MRI scan of the brain shows old right posterior cerebral, left superior cerebellar and right pontine and left cerebellar lacunar infarcts of remote age. There are moderate changes of chronic microvascular disease and severe changes of chronic paranasal sinus inflammation and bilateral maxillary antrum mucous retention cyst/polyps. No enhancing lesions are noted.   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 Vascular risk factors include hypertension, elevated hemoglobin A1c. Also with 1 episode of involuntary head movement with incontinence and tongue biting likely generalized seizure secondary to cerebral infarct. Had another seizure on 07/16/2012, and another breakthrough seizure on 09/04/2012 and on 05/11/13 in setting of UTI. Additional seizures on 12/17/2012, 02/23/2013 and 05/11/13.  New complaints of memory loss since the seizures, memory panel labs were normal. Increased Vimpat may be contributing to memory loss vs. silent seizures. MRI scan of the brain showed no new stroke. Wife reports progressive decline in cognition and is slow to respond. He has been progressively weaker  and having frequent falls. No seizures since March 2015.  PLAN:  I had a long discussion with the patient and his wife regarding his seizures, memory loss, results of recent evaluation and answered questions. Continue Depakote to 500 mg 3 times daily via sprinkles and continue phenytoin 100 twice daily via chewable tablets. Continue Vimpat in the liquid form and the current dose, 10 ml. Return for followup in 6 months, sooner as needed.  Philmore Pali, MSN, NP-C 03/10/2014, 4:23 PM [facility] [address], [place], [place] [address] [phone]  Note: This document was prepared with digital dictation and possible smart phrase technology. Any transcriptional errors that result from this process are unintentional.

## 2014-03-10 NOTE — Patient Instructions (Signed)
Continue current medications at current doses.  Try to fit in some light exercise every day.    Continue to wear your compression socks and elevate your feet as much as possible.  You may try some epsom salt soaks to decrease the swelling.  Follow up in 6 months, sooner as needed.

## 2014-03-14 ENCOUNTER — Other Ambulatory Visit: Payer: Self-pay | Admitting: Family Medicine

## 2014-03-14 NOTE — Progress Notes (Signed)
I agree with the above plan 

## 2014-03-15 NOTE — Telephone Encounter (Signed)
IS THIS OKAY TO REFILL 

## 2014-03-22 ENCOUNTER — Telehealth: Payer: Self-pay | Admitting: Family Medicine

## 2014-03-22 NOTE — Telephone Encounter (Signed)
Pt's wife called and stated that she wanted to schedule him to have a pedicure with leg/ foot massage and a full body massage. She just wanted to make sure you are ok with that. She knows that you are out of town so she isn't expecting a call back until next week.

## 2014-03-25 NOTE — Telephone Encounter (Signed)
I'm OK with this 

## 2014-03-26 ENCOUNTER — Telehealth: Payer: Self-pay | Admitting: Nurse Practitioner

## 2014-03-26 NOTE — Telephone Encounter (Signed)
I called back.  They will go to [email or web address] and print a new voucher.

## 2014-03-26 NOTE — Telephone Encounter (Signed)
Patient wife called stating she needs a discount card for Vimpat. The one she has expired and she said that she received the card from [place].

## 2014-03-29 NOTE — Telephone Encounter (Signed)
CALLED Craig [name] TO INFORM HER THIS WAS OKAY PER JCL LEFT MESSAGE WORD FOR WORD

## 2014-04-12 ENCOUNTER — Other Ambulatory Visit: Payer: Self-pay | Admitting: Family Medicine

## 2014-05-05 ENCOUNTER — Encounter: Payer: Self-pay | Admitting: Family Medicine

## 2014-05-05 ENCOUNTER — Ambulatory Visit (INDEPENDENT_AMBULATORY_CARE_PROVIDER_SITE_OTHER): Payer: BC Managed Care – PPO | Admitting: Family Medicine

## 2014-05-05 VITALS — BP 140/90 | HR 60 | Wt 162.0 lb

## 2014-05-05 DIAGNOSIS — E162 Hypoglycemia, unspecified: Secondary | ICD-10-CM

## 2014-05-05 DIAGNOSIS — Z23 Encounter for immunization: Secondary | ICD-10-CM

## 2014-05-05 DIAGNOSIS — L723 Sebaceous cyst: Secondary | ICD-10-CM

## 2014-05-05 NOTE — Progress Notes (Signed)
   Subjective:    Patient ID: Craig [name], male    DOB: 20-May-1951, 63 y.o.   MRN: [id number]  HPI He comes in with his wife for concerns of drowsiness. She is concerned over low blood sugar. She did check a one time and it was 58. He does get drowsy usually after he does. He is on multiple medications at can be sedating. There is also question of glucose intolerance on one of his hospitalizations. They did recommend dietary restrictions which was essentially a low carbohydrate type diet There is also a lesion on his back that  he would like evaluated. Review of Systems     Objective:   Physical Exam Alert and in no distress. A 2 cm raised round smooth lesion is noted on the back with a central black dimple.       Assessment & Plan:  Hypoglycemia  Sebaceous cyst  Need for prophylactic vaccination and inoculation against influenza - Plan: Flu Vaccine QUAD 36+ mos IM  he did have 1 reading of blood sugar of 58 however after he ate he was still fatigued. Explained that although this was low it was not necessarily dangerous. Discussed proper dietary modifications specifically avoiding high carbohydrate types of foods. Discussed the need for fat and protein in his diet. He works comfortable with this understand that her eating habits.

## 2014-05-27 ENCOUNTER — Telehealth: Payer: Self-pay | Admitting: Nurse Practitioner

## 2014-05-27 NOTE — Telephone Encounter (Signed)
Patient's spouse called and stated Rx picked up for phenytoin (DILANTIN) 50 MG tablet were capsules.  Patient 's Rx had been chewables, and would like to switch back to chewables.  Please call anytime and may leave detailed message on voicemail.

## 2014-05-31 ENCOUNTER — Other Ambulatory Visit: Payer: Self-pay | Admitting: Nurse Practitioner

## 2014-05-31 ENCOUNTER — Telehealth: Payer: Self-pay

## 2014-05-31 NOTE — Telephone Encounter (Signed)
I called and spoke with Estill Bamberg at the pharmacy.  She said they filled Dilantin 100mg  caps instead of the 50mg  chew tabs.  Authorization has been provided for the chew tabs.  They will fill Rx today and notify the patient when it's ready for pick up.  I called the patient back.  Ms Hufstetler is aware.

## 2014-06-01 NOTE — Telephone Encounter (Signed)
I have been out of the office since 09/21.  Message was forwarded to me today.    Please see previous note.  I already contacted the pharmacy regarding this.

## 2014-06-01 NOTE — Telephone Encounter (Signed)
Please advise. Thanks.  

## 2014-06-22 ENCOUNTER — Other Ambulatory Visit: Payer: Self-pay | Admitting: Nurse Practitioner

## 2014-06-22 ENCOUNTER — Other Ambulatory Visit: Payer: Self-pay | Admitting: Neurology

## 2014-06-22 MED ORDER — PHENYTOIN 50 MG PO CHEW: CHEWABLE_TABLET | ORAL | Status: DC

## 2014-06-24 ENCOUNTER — Telehealth: Payer: Self-pay | Admitting: *Deleted

## 2014-06-24 ENCOUNTER — Telehealth: Payer: Self-pay | Admitting: Nurse Practitioner

## 2014-06-24 ENCOUNTER — Other Ambulatory Visit: Payer: Self-pay | Admitting: Nurse Practitioner

## 2014-06-24 MED ORDER — PHENYTOIN SODIUM EXTENDED 100 MG PO CAPS: 100.0000 mg | ORAL_CAPSULE | Freq: Every day | ORAL | Status: DC

## 2014-06-24 MED ORDER — DIVALPROEX SODIUM 125 MG PO DR TAB: 500.0000 mg | DELAYED_RELEASE_TABLET | Freq: Three times a day (TID) | ORAL | Status: DC

## 2014-06-24 NOTE — Telephone Encounter (Signed)
I called and spoke with Craig [name].  Provided info for Vimpat PAP [phone], and [facility] for generic medications [phone].  She will contact them and see if they qualify for assistance through these programs.  They will call us back if anything further is needed.

## 2014-06-24 NOTE — Telephone Encounter (Signed)
Wife here and is needing samples of his medications (seizure meds) due to insurance changing.   Cost is $800 for all three meds.  (Gave Vimpat starter kit (x2) [place] [phone], lot [id number] Exp 08/2018 , and Vimpat $RemoveBe'100mg'GVMYYUmkT$  tab [facility] [id number] Lot [id number] exp 11/2017 (x 1).  Gave instructions of vimpat $RemoveB'100mg'WMKfwHoj$  tabs 1 tab in am and pm,  Starter kit, $RemoveBefo'50mg'mRXrtpKMszf$  tabs 2 tabs po am and pm.  Pt will take with applesauce.  Wife stated he can take.  Relayed could not crush (enteric coated meds).  She verbalized understanding and instructions.   Will call back if questions.   She will also check with HR about insurance.  Will forward to [place] with pharm and see if  Anything else be done (re: if problems getting medications).

## 2014-06-24 NOTE — Telephone Encounter (Signed)
I spoke with patient earlier and provided info for assistance programs.  They called back saying [facility] would be able to aide with Phenytoin and Divalproex, however, they do not have Phenytoin 50mg  chew tabs, they only carry Phenytoin 100mg  XR caps.  As well, they do not have Divalproex 125mg  capsules, but do have 125mg  tablets.  Could the Rx's be changed to reflect what they carry?  If permissible, they will need a Rx printed for each of these medications for 90 days per fill.  (You can send the Rx' to me and I will fax to program along with form they require) Please advise.  Thank you.

## 2014-06-24 NOTE — Telephone Encounter (Signed)
[employer] Employee to Tashia 06-24-14.

## 2014-06-24 NOTE — Telephone Encounter (Signed)
Thank you Kersey.  This may be a problem because he has been relatively stable on the current regimen.

## 2014-06-24 NOTE — Telephone Encounter (Signed)
Yes, I have changed the prescriptions to what they requested and sign and will fax to you. Thanks.

## 2014-06-24 NOTE — Telephone Encounter (Signed)
Patient's spouse requesting Rx refill for phenytoin (PHENYTOIN INFATABS) 50 MG tablet (Pharmacy only has 100 mg and stated directions should read 1 tablet a day) and divalproex (DEPAKOTE SPRINKLES) 125 MG capsule ( Pharmacy only has in tablets) sent to Out reach Program at fax # [phone].  Please call and advise.

## 2014-06-25 NOTE — Telephone Encounter (Signed)
All info has been faxed.  I called back.  Got no answer on home or cell line.  Both mailboxes were full, unable to leave message.

## 2014-06-25 NOTE — Telephone Encounter (Signed)
Tried to cal again.  Got no answer on home or cell.  Mailbox full, unable to leave message.

## 2014-07-06 ENCOUNTER — Encounter: Payer: Self-pay | Admitting: Family Medicine

## 2014-08-09 ENCOUNTER — Telehealth: Payer: Self-pay | Admitting: Neurology

## 2014-08-09 NOTE — Telephone Encounter (Signed)
Patient checking status of FMLA paperwork given to Tashia on 06/24/14.  Please call and adavise.

## 2014-08-09 NOTE — Telephone Encounter (Signed)
Called patient's spouse and informed them that we are waiting on Dr Leonie Man to finish completing form and as soon as it is finished we will call her and let her know that form is available for pick up, spouse was upset that form is not ready by now, since it was dropped off 06/24/14. Patient stated that she would like to get a refund and the form back to keep in case she will need it done again, form has been left on Angie's desk for refund.

## 2014-08-11 NOTE — Telephone Encounter (Signed)
Craig [name] patient is calling about form.

## 2014-08-11 NOTE — Telephone Encounter (Signed)
Patient's spouse checking status of FMLA form pick up and refund.  Please call and advise.

## 2014-08-12 ENCOUNTER — Telehealth: Payer: Self-pay | Admitting: *Deleted

## 2014-08-12 NOTE — Telephone Encounter (Signed)
[employer] [facility] State Employee  Dr Leonie Man would not complete form patient will pick up.

## 2014-08-17 ENCOUNTER — Encounter: Payer: Self-pay | Admitting: Family Medicine

## 2014-08-17 ENCOUNTER — Ambulatory Visit (INDEPENDENT_AMBULATORY_CARE_PROVIDER_SITE_OTHER): Payer: Medicare Other | Admitting: Family Medicine

## 2014-08-17 VITALS — HR 74 | Wt 174.0 lb

## 2014-08-17 DIAGNOSIS — R609 Edema, unspecified: Secondary | ICD-10-CM | POA: Insufficient documentation

## 2014-08-17 DIAGNOSIS — G40209 Localization-related (focal) (partial) symptomatic epilepsy and epileptic syndromes with complex partial seizures, not intractable, without status epilepticus: Secondary | ICD-10-CM

## 2014-08-17 DIAGNOSIS — I639 Cerebral infarction, unspecified: Secondary | ICD-10-CM

## 2014-08-17 NOTE — Progress Notes (Signed)
Subjective:     Patient ID: Craig [name], male   DOB: June 30, 1951, 63 y.o.   MRN: [id number]  HPI  This is a 63 year old male with a history HTN, L-sided hemiparesis from stroke in 2013, dementia, and seizure who presents with bilateral swelling of the lower extremities.  His wife has noticed swelling of his lower extremities.  She first had this evaluated by [place] in June and they were instructed to start using compression stockings and elevate his legs.  He is minimally ambulatory at home, and they stopped taking him to physical therapy when he had his last seizure in March and was becoming less participatory.  He has not experienced asymmetric swelling, erythema, warmth, pain of his calves or thighs, or ulceration.  He does not experience dyspnea on exertion, chest pain, pain in his arms, chronic cough, or PND.  He has not been to his neurologist since July, and his phenytoin levels have not been monitored accordingly.  He began taking depakote, pphenytoin, and vimpat after seizures occurred after his stroke.  FH is significant for congestive heart failure in his mother.  Review of Systems  Per HPI     Objective:   Physical Exam  Filed Vitals:   08/17/14 1455  Weight: 174 lb (78.926 kg)   General: Pleasant gentleman, stooped in wheelchair, in no acute distress.  Intermittently attending to the conversation. CV: Regular rate and rhythm with a normal S1/S2 Pulm: Clear bilaterally with a normal work of breathing. Extremities: 2+ Pitting edema bilaterally with left foot more swollen than right.  Popliteal pulses present bilaterally; unable to assess PT/DP pulses.  Feet without cyanosis, ulceration, deformity.  Homan's sign negative bilaterally.     Assessment & Plan:      Partial symptomatic epilepsy with complex partial seizures, not intractable, without status epilepticus - Plan: Phenytoin level, free, Valproic acid level  Dependent edema    His symptoms and finding are consistent  with dependent edema related to his immobility.  He should begin using his compression stockings at night as well to combat this.  Our suspicion for other causes of his edema such as DVT or CHF is low.  He should follow up with the neurologist regarding his dementia and seizure history.

## 2014-08-18 ENCOUNTER — Other Ambulatory Visit: Payer: Self-pay | Admitting: Neurology

## 2014-08-18 LAB — VALPROIC ACID LEVEL: Valproic Acid Lvl: 88.6 ug/mL (ref 50.0–100.0)

## 2014-08-20 ENCOUNTER — Other Ambulatory Visit: Payer: Self-pay

## 2014-08-20 ENCOUNTER — Telehealth: Payer: Self-pay

## 2014-08-20 DIAGNOSIS — M545 Low back pain: Secondary | ICD-10-CM

## 2014-08-20 LAB — PHENYTOIN LEVEL, FREE AND TOTAL
PHENYTOIN BOUND: 7.7 mg/L
PHENYTOIN FREE: 1.2 mg/L (ref 1.0–2.0)
PHENYTOIN, TOTAL: 8.9 mg/L — AB (ref 10.0–20.0)

## 2014-08-20 NOTE — Telephone Encounter (Signed)
Let's set him up for an LS-spine and then have him come here after the x-rays are done

## 2014-08-20 NOTE — Telephone Encounter (Signed)
lmtcb

## 2014-08-20 NOTE — Telephone Encounter (Signed)
Dr.Lalonde when I called to give labs results she is very concerned about Craig [name]'s back pain she said she has mentioned it several times and she dosent want it brushed off she does give him tylenol when he complains and she has bought a new mattress but he still continues to complain of back pain she wanted to know if he could have an x-ray or something to make sure everything is okay please advise

## 2014-08-20 NOTE — Telephone Encounter (Signed)
Dr.Lalonde when I called to give labs results she is very concerned about Al's back pain she said she has mentioned it several times and she dosent want it brushed off she does give him tylenol when he complains and she has bought a new mattress but he still continues to complain of back pain she wanted to know if he could have an x-ray or something to make sure everything is okay please advise

## 2014-08-23 ENCOUNTER — Ambulatory Visit
Admission: RE | Admit: 2014-08-23 | Discharge: 2014-08-23 | Disposition: A | Discharge status: Home / Self Care | Payer: BC Managed Care – PPO | Source: Ambulatory Visit | Attending: Family Medicine | Admitting: Family Medicine

## 2014-08-23 DIAGNOSIS — M545 Low back pain: Secondary | ICD-10-CM

## 2014-08-25 ENCOUNTER — Ambulatory Visit (INDEPENDENT_AMBULATORY_CARE_PROVIDER_SITE_OTHER): Payer: Medicare Other | Admitting: Family Medicine

## 2014-08-25 ENCOUNTER — Encounter: Payer: Self-pay | Admitting: Family Medicine

## 2014-08-25 VITALS — BP 134/80 | HR 72

## 2014-08-25 DIAGNOSIS — G8929 Other chronic pain: Secondary | ICD-10-CM

## 2014-08-25 DIAGNOSIS — M549 Dorsalgia, unspecified: Secondary | ICD-10-CM | POA: Diagnosis not present

## 2014-08-25 NOTE — Progress Notes (Signed)
   Subjective:    Patient ID: Craig [name], male    DOB: 1951-01-20, 63 y.o.   MRN: [id number]  HPI He is here for consult concerning recent x-rays and to go over the results. The x-rays did show degenerative changes in the lumbar area.   Review of Systems     Objective:   Physical Exam Alert and in no distress otherwise not examined       Assessment & Plan:  Chronic back pain - Plan: Ambulatory referral to Physical Therapy  I explained the x-rays to him and his wife. Explained that these are chronic degenerative changes and the best therapy for this would be a good physical therapy/rehabilitation program for strengthening area he does tend to sit in a hunched over position which tends to relieve his symptoms but ultimately and not making this worse. Recommended Tylenol first and then Advil or Aleve.Marland Kitchen

## 2014-09-07 ENCOUNTER — Telehealth: Payer: Self-pay | Admitting: *Deleted

## 2014-09-07 ENCOUNTER — Ambulatory Visit: Payer: BC Managed Care – PPO | Attending: Family Medicine

## 2014-09-07 DIAGNOSIS — M25659 Stiffness of unspecified hip, not elsewhere classified: Secondary | ICD-10-CM | POA: Diagnosis not present

## 2014-09-07 DIAGNOSIS — M549 Dorsalgia, unspecified: Secondary | ICD-10-CM | POA: Insufficient documentation

## 2014-09-07 DIAGNOSIS — R531 Weakness: Secondary | ICD-10-CM | POA: Diagnosis not present

## 2014-09-07 DIAGNOSIS — G8929 Other chronic pain: Secondary | ICD-10-CM | POA: Diagnosis not present

## 2014-09-07 NOTE — Therapy (Signed)
Craig [name], [place], [address] Phone: [phone]   Fax:  [phone]  Physical Therapy Evaluation  Patient Details  Name: Craig [name] MRN: [id number] Date of Birth: Oct 03, 1950  Encounter Date: 09/07/2014      PT End of Session - 09/07/14 1054    Visit Number 1   Number of Visits 12   Date for PT Re-Evaluation 10/20/14   PT Start Time 1020   PT Stop Time 1110   PT Time Calculation (min) 50 min   Activity Tolerance Patient tolerated treatment well   Behavior During Therapy [facility] for tasks assessed/performed      Past Medical History  Diagnosis Date  . Hypertension   . Allergy     RHINITIS  . Diverticulosis   . Colonic polyp   . 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 Seizures     No past surgical history on file.  There were no vitals taken for this visit.  Visit Diagnosis:  Chronic back pain - Plan: PT plan of care cert/re-cert  General weakness - Plan: PT plan of care cert/re-cert  Stiff hip, unspecified laterality - Plan: PT plan of care cert/re-cert      Subjective Assessment - 09/07/14 1021    Symptoms Lower back pain   Pertinent History He was lying down and trying to get up and began to have pain . He recieves assitance for all mobility.    Limitations --  Pain nwith rolling over in bed   How long can you sit comfortably? As needed   How long can you walk comfortably? 30 feet with +1 guarding (normal and not limited by pain)   Diagnostic tests Xrays with DDD   Patient Stated Goals Eliminate pain   Currently in Pain? No/denies   Multiple Pain Sites No          [facility] PT Assessment - 09/07/14 1027    Assessment   Medical Diagnosis chronic LBP   Onset Date --  4 months ago   Next MD Visit As needed   Prior Therapy No   Precautions   Precautions Fall   Restrictions   Weight Bearing Restrictions No   Balance Screen   Has the patient fallen in the  past 6 months Yes   How many times? 1  Assit to floor by wife.History of limited balance due to CVA   Has the patient had a decrease in activity level because of a fear of falling?  Yes   Is the patient reluctant to leave their home because of a fear of falling?  Yes   Prior Function   Level of Independence Needs assistance with gait  transfers and all mobility   Observation/Other Assessments   Observations LT sideecd neglect, decr sensation on LT side.    Sensation   Light Touch Impaired by gross assessment  LT arm   AROM   Overall AROM Comments Trun mobiity tested sitting and limited extension more than sidebending and flexion but WFL   Bed Mobility   Bed Mobility --  Needs assist for all mobility   Transfers   Transfers --  With [facility] and contact guard   Ambulation/Gait   Ambulation/Gait --  Need +1 contact guard with [facility]                          PT Education - 09/07/14 1054    Education provided  Yes   Education Details plan of care   Person(s) Educated Patient;Spouse   Methods Explanation   Comprehension Verbalized understanding          PT Short Term Goals - Oct 04, 2014 1058    PT SHORT TERM GOAL #1   Title Indpeendent HEP with caregivers   Time 3   Period Weeks   Status New   PT SHORT TERM GOAL #2   Title PT report reduced pain by 50% or more with transitional movements   Time 3   Period Weeks   Status New           PT Long Term Goals - October 04, 2014 1059    PT LONG TERM GOAL #1   Title Pt with caregivers independent with HEP issued as of last visit   Time 6   Period Weeks   Status New   PT LONG TERM GOAL #2   Title Pt will rpeort 90% decr pain with transitional movements   Time 6   Period Weeks   Status New               Plan - 10-04-14 1055    Clinical Impression Statement [name] is severly limited withoutpain and this will limit progress. He is tight in anterior hips and postures in flexion and this may contribute to  his pain.    Pt will benefit from skilled therapeutic intervention in order to improve on the following deficits Decreased range of motion;Pain;Decreased mobility;Postural dysfunction   Rehab Potential Good   PT Frequency 2x / week   PT Duration 6 weeks  if improved at 4 weeks   PT Treatment/Interventions Moist Heat;Patient/family education;Passive range of motion;Therapeutic exercise;Manual techniques;Functional mobility training   PT Next Visit Plan HEP for stretching of trunk and hips flexors   PT Home Exercise Plan HEP hip and trunk flexibility   Recommended Other Services No   Consulted and Agree with Plan of Care Patient;Family member/caregiver          G-Codes - 10/04/14 1117    Functional Assessment Tool Used Clinical judgement   Functional Limitation Changing and maintaining body position   Changing and Maintaining Body Position Current Status [phone]) At least 80 percent but less than 100 percent impaired, limited or restricted   Changing and Maintaining Body Position Goal Status ([id number]) At least 60 percent but less than 80 percent impaired, limited or restricted       Problem List Patient Active Problem List   Diagnosis Date Noted  . Dependent edema 08/17/2014  . History of stroke 03/10/2014  . Memory loss 08/17/2013  . 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 Alterations of sensations, late effect of cerebrovascular disease 01/29/2012  . 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 CVA (cerebral infarction) 12/06/2011  . Hypertension, accelerated 05/15/2011    Darrel Hoover PT October 04, 2014, 11:19 AM  [facility] [address] [place], [place], [address] Phone: [phone]   Fax:  [phone]

## 2014-09-07 NOTE — Patient Instructions (Signed)
Discussed prognosis and therapy goals and intervention with spouse , patient and caregiver

## 2014-09-07 NOTE — Telephone Encounter (Signed)
APPTS MADE AND PRINTED...TD 

## 2014-09-20 ENCOUNTER — Ambulatory Visit: Payer: BC Managed Care – PPO

## 2014-09-20 ENCOUNTER — Ambulatory Visit: Payer: BC Managed Care – PPO | Admitting: Nurse Practitioner

## 2014-09-20 DIAGNOSIS — G8929 Other chronic pain: Secondary | ICD-10-CM

## 2014-09-20 DIAGNOSIS — R531 Weakness: Secondary | ICD-10-CM

## 2014-09-20 DIAGNOSIS — M25659 Stiffness of unspecified hip, not elsewhere classified: Secondary | ICD-10-CM

## 2014-09-20 DIAGNOSIS — M549 Dorsalgia, unspecified: Principal | ICD-10-CM

## 2014-09-20 NOTE — Patient Instructions (Signed)
HIP: Flexors - Supine   Lie on edge of surface. Place leg off the surface, allow knee to bend. Bring other knee toward chest. Hold  30 ___ seconds. __1-3_ reps per set, _1-2Flexion: Stretch - Hamstrings (Supine)   Position (A) Helper: Stabilize hip. Place other hand under left ankle. Motion (B) -Helper lifts leg, keeping knee straight and foot pointing in direction of movement. -Stop at point of tension in back of thigh. -Do not allow patient's pelvis to rise off bed. CAUTION: Do not allow knee to go beyond straight. Hold __30_ seconds. Repeat _2__ times. Repeat with other leg. Do __2Lower Trunk Rotation Stretch   Keeping back flat and feet together, rotate knees to left side. Hold _30___ seconds. Repeat ___2_ times per set. Do __1-2__ sets per session. Do _2___ sessions per day.    RT an LT  http://orth.exer.us/122   Copyright  VHI. All rights reserved.  _ sessions per day.   Copyright  VHI. All rights reserved.  __ sets per day, ___ days per week Rest lowered foot on stool.  Copyright  VHI. All rights reserved.

## 2014-09-20 NOTE — Therapy (Signed)
[place], [place], [address] Phone: [phone]   Fax:  [phone]  Physical Therapy Treatment  Patient Details  Name: Craig [name] MRN: [id number] Date of Birth: [birth year] Referring Provider:  Denita Lung, MD  Encounter Date: 09/20/2014      PT End of Session - 09/20/14 1050    Visit Number 2   Number of Visits 12   Date for PT Re-Evaluation 10/20/14   PT Start Time 1025   PT Stop Time 1054   PT Time Calculation (min) 39 min   Activity Tolerance Patient tolerated treatment well   Behavior During Therapy [facility] for tasks assessed/performed      Past Medical History  Diagnosis Date  . Hypertension   . Allergy     RHINITIS  . Diverticulosis   . Colonic polyp   . 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 Seizures     No past surgical history on file.  There were no vitals taken for this visit.  Visit Diagnosis:  Chronic back pain  General weakness  Stiff hip, unspecified laterality      Subjective Assessment - 09/20/14 1034    Symptoms No pain since last visit   Currently in Pain? No/denies   Multiple Pain Sites No                    [facility] Adult PT Treatment/Exercise - 09/20/14 0001    Lumbar Exercises: Stretches   Lower Trunk Rotation Limitations We worked on upper and lower trunk rotation in supine and sideye.  Much verbal and tactile cuing used.    Manual Therapy   Manual Therapy Passive ROM   Passive ROM Passive strething for both hip flexors and hip flexion and trunk rotation                PT Education - 09/20/14 1042    Education provided Yes   Person(s) Educated Patient;Spouse;Caregiver(s)   Methods Explanation;Demonstration;Verbal cues;Handout;Tactile cues   Comprehension Verbalized understanding          PT Short Term Goals - 09/07/14 1058    PT SHORT TERM GOAL #1   Title Indpeendent HEP with caregivers   Time 3    Period Weeks   Status New   PT SHORT TERM GOAL #2   Title PT report reduced pain by 50% or more with transitional movements   Time 3   Period Weeks   Status New           PT Long Term Goals - 09/07/14 1059    PT LONG TERM GOAL #1   Title Pt with caregivers independent with HEP issued as of last visit   Time 6   Period Weeks   Status New   PT LONG TERM GOAL #2   Title Pt will rpeort 90% decr pain with transitional movements   Time 6   Period Weeks   Status New               Plan - 09/20/14 1051    Clinical Impression Statement Much improved with no pain. HEP stretches issued    Pt will benefit from skilled therapeutic intervention in order to improve on the following deficits Decreased range of motion;Pain;Decreased mobility;Postural dysfunction   Rehab Potential Good   PT Frequency 2x / week   PT Duration 4 weeks   PT Treatment/Interventions Moist Heat;Patient/family education;Passive range of motion;Therapeutic exercise;Manual techniques;Functional mobility training  PT Next Visit Plan HEP for stretching of trunk and hips flexors   Add some active rotation exercises for home   PT Home Exercise Plan HEP hip and trunk flexibility   Consulted and Agree with Plan of Care Patient;Family member/caregiver        Problem List Patient Active Problem List   Diagnosis Date Noted  . Dependent edema 08/17/2014  . History of stroke 03/10/2014  . Memory loss 08/17/2013  . 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 Alterations of sensations, late effect of cerebrovascular disease 01/29/2012  . 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 CVA (cerebral infarction) 12/06/2011  . Hypertension, accelerated 05/15/2011    Darrel Hoover PT 09/20/2014, 10:53 AM  [facility] [address] [place], [place], [address] Phone: [phone]   Fax:  [phone]

## 2014-09-21 ENCOUNTER — Other Ambulatory Visit: Payer: Self-pay

## 2014-09-21 ENCOUNTER — Telehealth: Payer: Self-pay | Admitting: Family Medicine

## 2014-09-21 ENCOUNTER — Telehealth: Payer: Self-pay | Admitting: *Deleted

## 2014-09-21 DIAGNOSIS — I69993 Ataxia following unspecified cerebrovascular disease: Secondary | ICD-10-CM

## 2014-09-21 DIAGNOSIS — Z8673 Personal history of transient ischemic attack (TIA), and cerebral infarction without residual deficits: Secondary | ICD-10-CM

## 2014-09-21 DIAGNOSIS — H53462 Homonymous bilateral field defects, left side: Secondary | ICD-10-CM

## 2014-09-21 DIAGNOSIS — R413 Other amnesia: Secondary | ICD-10-CM

## 2014-09-21 MED ORDER — "SIMPLICITY ADULT BRIEF 32""-44"" MISC": 1.0000 | Status: DC | PRN

## 2014-09-21 MED ORDER — ENSURE NUTRITION SHAKE PO LIQD: 1.0000 | Freq: Three times a day (TID) | ORAL | Status: DC

## 2014-09-21 MED ORDER — BIOTENE DRY MOUTH MT LIQD: 15.0000 mL | OROMUCOSAL | Status: DC | PRN

## 2014-09-21 MED ORDER — DISPOSABLE GLOVES MISC: Status: DC

## 2014-09-21 MED ORDER — BIOTENE DRY MOUTH DT PSTE: 1.0000 "application " | PASTE | DENTAL | Status: DC | PRN

## 2014-09-21 MED ORDER — RA ADULT WIPES MISC: 2.0000 | Status: DC | PRN

## 2014-09-21 NOTE — Telephone Encounter (Signed)
Please give the pt wife a call. She stated that the pt knee popped...td

## 2014-09-21 NOTE — Telephone Encounter (Addendum)
Pt's wife wants to know what she can do to get a script for medical supplies such as Depends, gloves, wipes, Ensure, etc. for pr to submit through Medicare for coverage. Pt said she was informed that if she gets a script for supplies and get them through the proper channels Medicare will cover. Wife wants some guidance on how to do this and scripts for the items. She can be reached at [phone] or [phone]

## 2014-09-21 NOTE — Telephone Encounter (Signed)
See what you can do to help with this.

## 2014-09-21 NOTE — Telephone Encounter (Signed)
Spoke with patient wife . They report Craig [name]'s knee popped at last PT and is now painful. Asked then to ice and we would look at it tomorrow.

## 2014-09-21 NOTE — Telephone Encounter (Signed)
Done but still need to refer a home health

## 2014-09-22 ENCOUNTER — Ambulatory Visit: Payer: BC Managed Care – PPO

## 2014-09-22 DIAGNOSIS — M25659 Stiffness of unspecified hip, not elsewhere classified: Secondary | ICD-10-CM

## 2014-09-22 DIAGNOSIS — G8929 Other chronic pain: Secondary | ICD-10-CM

## 2014-09-22 DIAGNOSIS — R531 Weakness: Secondary | ICD-10-CM

## 2014-09-22 DIAGNOSIS — M549 Dorsalgia, unspecified: Principal | ICD-10-CM

## 2014-09-23 NOTE — Telephone Encounter (Signed)
I have faxed all info to advance to get a home health eval

## 2014-09-27 ENCOUNTER — Ambulatory Visit: Payer: BC Managed Care – PPO | Admitting: Rehabilitation

## 2014-09-29 ENCOUNTER — Ambulatory Visit: Payer: BC Managed Care – PPO

## 2014-09-29 DIAGNOSIS — R531 Weakness: Secondary | ICD-10-CM

## 2014-09-29 DIAGNOSIS — G8929 Other chronic pain: Secondary | ICD-10-CM

## 2014-09-29 DIAGNOSIS — M549 Dorsalgia, unspecified: Secondary | ICD-10-CM | POA: Diagnosis not present

## 2014-09-29 DIAGNOSIS — M25659 Stiffness of unspecified hip, not elsewhere classified: Secondary | ICD-10-CM

## 2014-09-29 NOTE — Patient Instructions (Addendum)
Abdominal Curl (Eccentric)   Lie on back, knees bent. Lift into curl position, tightening stomach. Slowly lower for 3-5 seconds. ___ reps per set, ___ sets per day, ___ days per week. Progress from hands by hips to hands on chest to hands behind head.  Copyright  VHI. All rights reserved.  Bridge   Lie back, legs bent. Inhale, pressing hips up. Keeping ribs in, lengthen lower back. Exhale, rolling down along spine from top. Repeat ____ times. Do ____ sessions per day.  Copyright  VHI. All rights reserved.  Also lower trunk rotation RT and LT active/ act assist

## 2014-09-29 NOTE — Therapy (Addendum)
[place], [place], [address] Phone: [phone]   Fax:  [phone]  Physical Therapy Treatment  Patient Details  Name: Adonus Uselman MRN: [id number] Date of Birth: 07-15-1951 Referring Provider:  Denita Lung, MD  Encounter Date: 09/29/2014      PT End of Session - 09/29/14 1059    Visit Number 3   Number of Visits 12   Date for PT Re-Evaluation 10/20/14   PT Start Time 1020   PT Stop Time 1058   PT Time Calculation (min) 38 min   Activity Tolerance Patient tolerated treatment well   Behavior During Therapy [facility] for tasks assessed/performed      Past Medical History  Diagnosis Date  . Hypertension   . Allergy     RHINITIS  . Diverticulosis   . Colonic polyp   . 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 Seizures     No past surgical history on file.  There were no vitals taken for this visit.  Visit Diagnosis:  Chronic back pain  General weakness  Stiff hip, unspecified laterality      Subjective Assessment - 09/29/14 1024    Symptoms Knee feels real good since last time. Tape off now. Mentioned pain last night momentarily but other wise no pain.   Currently in Pain? No/denies   Multiple Pain Sites No                    [facility] Adult PT Treatment/Exercise - 09/29/14 1056    Lumbar Exercises: Stretches   Lower Trunk Rotation --  12 reps assisted RT and LT   Lumbar Exercises: Supine   Bridge 15 reps  Knee flex hooklye and on bolster   Other Supine Lumbar Exercises partial sit ups with knes in hooklye and on bolster.    Manual Therapy   Passive ROM Passive stretching both hips , hamstrings, trunk rotation                PT Education - 09/29/14 1051    Education provided Yes   Education Details HEP   Person(s) Educated Patient;Child(ren)   Methods Explanation;Demonstration;Tactile cues;Verbal cues;Handout   Comprehension Verbalized  understanding;Returned demonstration          PT Short Term Goals - 09/29/14 1101    PT SHORT TERM GOAL #1   Title Indpendent HEP with caregivers   Status Achieved   PT SHORT TERM GOAL #2   Title PT report reduced pain by 50% or more with transitional movements   Status Achieved           PT Long Term Goals - 09/07/14 1059    PT LONG TERM GOAL #1   Title Pt with caregivers independent with HEP issued as of last visit   Time 6   Period Weeks   Status New   PT LONG TERM GOAL #2   Title Pt will rpeort 90% decr pain with transitional movements   Time 6   Period Weeks   Status New               Plan - 09/29/14 1100    Clinical Impression Statement knee and back pain essentiall resolved . I will follow up x1 to assess exercise and pain   Pt will benefit from skilled therapeutic intervention in order to improve on the following deficits Decreased range of motion;Pain;Decreased mobility;Postural dysfunction   Rehab Potential Good   PT  Frequency 1x / week   PT Duration 2 weeks   PT Treatment/Interventions Moist Heat;Patient/family education;Passive range of motion;Therapeutic exercise;Manual techniques;Functional mobility training   PT Next Visit Plan HEP for stretching of trunk and hips flexors   Add some active rotation exercises for home   PT Home Exercise Plan HEP hip and trunk flexibility   Consulted and Agree with Plan of Care Patient;Family member/caregiver        Problem List Patient Active Problem List   Diagnosis Date Noted  . Dependent edema 08/17/2014  . History of stroke 03/10/2014  . Memory loss 08/17/2013  . 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 Alterations of sensations, late effect of cerebrovascular disease 01/29/2012  . 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 CVA (cerebral infarction) 12/06/2011  . Hypertension, accelerated 05/15/2011    Darrel Hoover PT 09/29/2014, 11:01 AM  [facility] [address] [place], [place], [address] Phone: [phone]   Fax:  [phone]     PHYSICAL THERAPY DISCHARGE SUMMARY  Visits from Start of Care: 5  Current functional level related to goals / functional outcomes:    He  Had a seizure after the last PT visit and was hospitalized Remaining deficits: Unknown   Education / Equipment: HEP Plan: Patient agrees to discharge.  Patient goals were partially met. Patient is being discharged due to a change in medical status.  ?????   Darrel Hoover, PT                10/29/14  8:35 AM

## 2014-09-29 NOTE — Therapy (Signed)
[place], [place], [address] Phone: [phone]   Fax:  [phone]  Physical Therapy Treatment  Patient Details  Name: Craig [name] MRN: [id number] Date of Birth: 1951/08/23 Referring Provider:  Denita Lung, MD  Encounter Date: 09/22/2014      PT End of Session - 09/29/14 1059    Visit Number 3   Number of Visits 12   Date for PT Re-Evaluation 10/20/14   PT Start Time 1020   PT Stop Time 1058   PT Time Calculation (min) 38 min   Activity Tolerance Patient tolerated treatment well   Behavior During Therapy [facility] for tasks assessed/performed      Past Medical History  Diagnosis Date  . Hypertension   . Allergy     RHINITIS  . Diverticulosis   . Colonic polyp   . 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 Seizures     No past surgical history on file.  There were no vitals taken for this visit.  Visit Diagnosis:  No diagnosis found.      Subjective Assessment - 09/29/14 1024    Symptoms Knee feels real good since last time. Tape off now. Mentioned pain last night momentarily but other wise no pain.   Currently in Pain? No/denies   Multiple Pain Sites No                    [facility] Adult PT Treatment/Exercise - 09/29/14 1056    Lumbar Exercises: Stretches   Lower Trunk Rotation --  12 reps assisted RT and LT   Lumbar Exercises: Supine   Bridge 15 reps  Knee flex hooklye and on bolster   Other Supine Lumbar Exercises partial sit ups with knes in hooklye and on bolster.    Manual Therapy   Passive ROM Passive stretching both hips , hamstrings, trunk rotation                PT Education - 09/29/14 1051    Education provided Yes   Education Details HEP   Person(s) Educated Patient;Child(ren)   Methods Explanation;Demonstration;Tactile cues;Verbal cues;Handout   Comprehension Verbalized understanding;Returned demonstration          PT  Short Term Goals - 09/29/14 1101    PT SHORT TERM GOAL #1   Title Indpendent HEP with caregivers   Status Achieved   PT SHORT TERM GOAL #2   Title PT report reduced pain by 50% or more with transitional movements   Status Achieved           PT Long Term Goals - 09/07/14 1059    PT LONG TERM GOAL #1   Title Pt with caregivers independent with HEP issued as of last visit   Time 6   Period Weeks   Status New   PT LONG TERM GOAL #2   Title Pt will rpeort 90% decr pain with transitional movements   Time 6   Period Weeks   Status New               Plan - 09/29/14 1100    Clinical Impression Statement knee and back pain essentiall resolved . I will follow up x1 to assess exercise and pain   Pt will benefit from skilled therapeutic intervention in order to improve on the following deficits Decreased range of motion;Pain;Decreased mobility;Postural dysfunction   Rehab Potential Good   PT Frequency 1x / week   PT Duration  2 weeks   PT Treatment/Interventions Moist Heat;Patient/family education;Passive range of motion;Therapeutic exercise;Manual techniques;Functional mobility training   PT Next Visit Plan HEP for stretching of trunk and hips flexors   Add some active rotation exercises for home   PT Home Exercise Plan HEP hip and trunk flexibility   Consulted and Agree with Plan of Care Patient;Family member/caregiver        Problem List Patient Active Problem List   Diagnosis Date Noted  . Dependent edema 08/17/2014  . History of stroke 03/10/2014  . Memory loss 08/17/2013  . 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 Alterations of sensations, late effect of cerebrovascular disease 01/29/2012  . 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 CVA (cerebral infarction) 12/06/2011  . Hypertension, accelerated 05/15/2011    Darrel Hoover PT 09/29/2014, 12:22 PM  [place] [facility] [address] [place], [place], [address] Phone: [phone]   Fax:  [phone]

## 2014-10-02 ENCOUNTER — Encounter (HOSPITAL_COMMUNITY): Payer: Self-pay

## 2014-10-02 ENCOUNTER — Emergency Department (HOSPITAL_COMMUNITY): Payer: BC Managed Care – PPO

## 2014-10-02 ENCOUNTER — Emergency Department (HOSPITAL_COMMUNITY)
Admission: EM | Admit: 2014-10-02 | Discharge: 2014-10-02 | Disposition: A | Discharge status: Home / Self Care | Payer: BC Managed Care – PPO | Attending: Emergency Medicine | Admitting: Emergency Medicine

## 2014-10-02 DIAGNOSIS — Z86010 Personal history of colonic polyps: Secondary | ICD-10-CM | POA: Diagnosis not present

## 2014-10-02 DIAGNOSIS — Z7982 Long term (current) use of aspirin: Secondary | ICD-10-CM | POA: Insufficient documentation

## 2014-10-02 DIAGNOSIS — Z8719 Personal history of other diseases of the digestive system: Secondary | ICD-10-CM | POA: Insufficient documentation

## 2014-10-02 DIAGNOSIS — Z8673 Personal history of transient ischemic attack (TIA), and cerebral infarction without residual deficits: Secondary | ICD-10-CM | POA: Diagnosis not present

## 2014-10-02 DIAGNOSIS — R569 Unspecified convulsions: Secondary | ICD-10-CM

## 2014-10-02 DIAGNOSIS — F0390 Unspecified dementia without behavioral disturbance: Secondary | ICD-10-CM | POA: Diagnosis not present

## 2014-10-02 DIAGNOSIS — G40909 Epilepsy, unspecified, not intractable, without status epilepticus: Secondary | ICD-10-CM | POA: Insufficient documentation

## 2014-10-02 DIAGNOSIS — Z79899 Other long term (current) drug therapy: Secondary | ICD-10-CM | POA: Insufficient documentation

## 2014-10-02 DIAGNOSIS — I10 Essential (primary) hypertension: Secondary | ICD-10-CM | POA: Diagnosis not present

## 2014-10-02 HISTORY — DX: Unspecified dementia, unspecified severity, without behavioral disturbance, psychotic disturbance, mood disturbance, and anxiety: F03.90

## 2014-10-02 LAB — URINALYSIS, ROUTINE W REFLEX MICROSCOPIC
BILIRUBIN URINE: NEGATIVE
GLUCOSE, UA: NEGATIVE mg/dL
Hgb urine dipstick: NEGATIVE
Ketones, ur: NEGATIVE mg/dL
LEUKOCYTES UA: NEGATIVE
Nitrite: NEGATIVE
PH: 8 (ref 5.0–8.0)
Protein, ur: NEGATIVE mg/dL
Specific Gravity, Urine: 1.012 (ref 1.005–1.030)
Urobilinogen, UA: 1 mg/dL (ref 0.0–1.0)

## 2014-10-02 LAB — DIFFERENTIAL
BASOS ABS: 0 10*3/uL (ref 0.0–0.1)
Basophils Relative: 0 % (ref 0–1)
Eosinophils Absolute: 0.4 10*3/uL (ref 0.0–0.7)
Eosinophils Relative: 6 % — ABNORMAL HIGH (ref 0–5)
Lymphocytes Relative: 22 % (ref 12–46)
Lymphs Abs: 1.4 10*3/uL (ref 0.7–4.0)
MONOS PCT: 14 % — AB (ref 3–12)
Monocytes Absolute: 0.8 10*3/uL (ref 0.1–1.0)
NEUTROS ABS: 3.5 10*3/uL (ref 1.7–7.7)
NEUTROS PCT: 58 % (ref 43–77)

## 2014-10-02 LAB — I-STAT CHEM 8, ED
BUN: 16 mg/dL (ref 6–23)
Calcium, Ion: 1.09 mmol/L — ABNORMAL LOW (ref 1.13–1.30)
Chloride: 104 mmol/L (ref 96–112)
Creatinine, Ser: 1.1 mg/dL (ref 0.50–1.35)
Glucose, Bld: 88 mg/dL (ref 70–99)
HCT: 49 % (ref 39.0–52.0)
Hemoglobin: 16.7 g/dL (ref 13.0–17.0)
Potassium: 4 mmol/L (ref 3.5–5.1)
Sodium: 144 mmol/L (ref 135–145)
TCO2: 27 mmol/L (ref 0–100)

## 2014-10-02 LAB — ETHANOL

## 2014-10-02 LAB — CBC
HEMATOCRIT: 46.8 % (ref 39.0–52.0)
Hemoglobin: 15 g/dL (ref 13.0–17.0)
MCH: 29.1 pg (ref 26.0–34.0)
MCHC: 32.1 g/dL (ref 30.0–36.0)
MCV: 90.7 fL (ref 78.0–100.0)
Platelets: 196 10*3/uL (ref 150–400)
RBC: 5.16 MIL/uL (ref 4.22–5.81)
RDW: 13.2 % (ref 11.5–15.5)
WBC: 6.1 10*3/uL (ref 4.0–10.5)

## 2014-10-02 LAB — COMPREHENSIVE METABOLIC PANEL
ALT: 21 U/L (ref 0–53)
AST: 18 U/L (ref 0–37)
Albumin: 3.4 g/dL — ABNORMAL LOW (ref 3.5–5.2)
Alkaline Phosphatase: 58 U/L (ref 39–117)
Anion gap: 4 — ABNORMAL LOW (ref 5–15)
BUN: 13 mg/dL (ref 6–23)
CO2: 34 mmol/L — AB (ref 19–32)
Calcium: 8.7 mg/dL (ref 8.4–10.5)
Chloride: 108 mmol/L (ref 96–112)
Creatinine, Ser: 1.15 mg/dL (ref 0.50–1.35)
GFR calc non Af Amer: 66 mL/min — ABNORMAL LOW (ref >90)
GFR, EST AFRICAN AMERICAN: 77 mL/min — AB (ref >90)
GLUCOSE: 90 mg/dL (ref 70–99)
POTASSIUM: 4.2 mmol/L (ref 3.5–5.1)
Sodium: 146 mmol/L — ABNORMAL HIGH (ref 135–145)
Total Bilirubin: 0.3 mg/dL (ref 0.3–1.2)
Total Protein: 6.2 g/dL (ref 6.0–8.3)

## 2014-10-02 LAB — I-STAT TROPONIN, ED: Troponin i, poc: 0 ng/mL (ref 0.00–0.08)

## 2014-10-02 LAB — RAPID URINE DRUG SCREEN, HOSP PERFORMED
AMPHETAMINES: NOT DETECTED
BARBITURATES: NOT DETECTED
BENZODIAZEPINES: NOT DETECTED
COCAINE: NOT DETECTED
Opiates: NOT DETECTED
Tetrahydrocannabinol: NOT DETECTED

## 2014-10-02 LAB — CBG MONITORING, ED: GLUCOSE-CAPILLARY: 93 mg/dL (ref 70–99)

## 2014-10-02 LAB — PHENYTOIN LEVEL, TOTAL: PHENYTOIN LVL: 8.6 ug/mL — AB (ref 10.0–20.0)

## 2014-10-02 LAB — VALPROIC ACID LEVEL: Valproic Acid Lvl: 62 ug/mL (ref 50.0–100.0)

## 2014-10-02 LAB — APTT: aPTT: 34 seconds (ref 24–37)

## 2014-10-02 LAB — PROTIME-INR
INR: 1.01 (ref 0.00–1.49)
Prothrombin Time: 13.4 seconds (ref 11.6–15.2)

## 2014-10-02 MED ORDER — LACOSAMIDE 10 MG/ML PO SOLN: 125.0000 mg | Freq: Two times a day (BID) | ORAL | Status: DC

## 2014-10-02 MED ORDER — LACOSAMIDE 50 MG PO TABS
125.0000 mg | ORAL_TABLET | Freq: Two times a day (BID) | ORAL | Status: DC
  Administered 2014-10-02: 125 mg via ORAL
  Filled 2014-10-02: qty 3

## 2014-10-02 NOTE — ED Provider Notes (Signed)
CSN: [id number]     Arrival date & time 10/02/14  1659 History   First MD Initiated Contact with Patient 10/02/14 1704     Chief Complaint  Patient presents with  . Code Stroke    An emergency department physician performed an initial assessment on this suspected stroke patient at 8. I evaluated the patient at same time as Dr. Leonel Ramsay HPI The patient presented to the emergency room after an episode of unresponsiveness. The patient does have a history of an old stroke leaving him with left-sided deficits.  At baseline he does not ambulate much but can stand and transfer. The patient was at home with his son. Son called out to ask him about preparing some food for him. The patient did not respond. The patient does not recall any of this. He does recall EMS arriving at his home. EMS stated the patient was found slumped down in his wheelchair leaning towards the right hand side. They felt he had right sided deficits. The patient denies any complaints right now. He says he feels okay. He denies any trouble with headache, chest pain, abdominal pain. He does have a history of seizures as well but does not recall having a sensation of his aura which she usually has prior to seizure. The patient has been taking his medications. He denies any other complaints. Past Medical History  Diagnosis Date  . Hypertension   . Allergy     RHINITIS  . Diverticulosis   . Colonic polyp   . 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 Seizures    History reviewed. No pertinent past surgical history. Family History  Problem Relation Age of Onset  . Cancer Mother   . Arthritis Mother   . Heart disease Mother   . Prostate cancer Father   . Cancer Sister   . Colon cancer Neg Hx   . Rectal cancer Neg Hx   . Stomach cancer Neg Hx   . Stroke Brother     massive stroke    History  Substance Use Topics  . Smoking status: Never Smoker   . Smokeless tobacco: Never Used  . Alcohol Use:  No    Review of Systems  All other systems reviewed and are negative.     Allergies  Review of patient's allergies indicates no known allergies.  Home Medications   Prior to Admission medications   Medication Sig Start Date End Date Taking? Authorizing Provider  amLODipine (NORVASC) 10 MG tablet TAKE 1 TABLET BY MOUTH DAILY WITH BREAKFAST    Denita Lung, MD  antiseptic oral rinse (BIOTENE) LIQD 15 mLs by Mouth Rinse route as needed for dry mouth. 09/21/14   Denita Lung, MD  aspirin EC 81 MG tablet Take 81 mg by mouth daily with breakfast.    Historical Provider, MD  BYSTOLIC 10 MG tablet TAKE 1 TABLET BY MOUTH DAILY 04/12/14   Denita Lung, MD  Dentifrices (BIOTENE DRY MOUTH) PSTE Place 1 application onto teeth as needed. 09/21/14   Denita Lung, MD  Disposable Gloves MISC Use gloves as needed 09/21/14   Denita Lung, MD  divalproex (DEPAKOTE SPRINKLE) 125 MG capsule Take by mouth 2 (two) times daily.    Historical Provider, MD  Incontinence Supply Disposable (RA ADULT WIPES) MISC 2 each by Does not apply route as needed. 09/21/14   Denita Lung, MD  Incontinence Supply Disposable (SIMPLICITY ADULT BRIEF 78"-29") MISC 1 each by Does not  apply route as needed. 09/21/14   Denita Lung, MD  irbesartan (AVAPRO) 150 MG tablet TAKE 1 TABLET BY MOUTH DAILY    Denita Lung, MD  Nutritional Supplements (ENSURE NUTRITION SHAKE) LIQD Take 1 each by mouth 3 (three) times daily. 09/21/14   Denita Lung, MD  phenytoin (DILANTIN) 100 MG ER capsule Take 1 capsule (100 mg total) by mouth daily. 06/24/14   Philmore Pali, NP  VIMPAT 10 MG/ML SOLN TAKE 10 ML BY MOUTH TWICE DAILY 06/22/14   Philmore Pali, NP   BP 165/94 mmHg  Pulse 70  Temp(Src) 97.8 F (36.6 C) (Oral)  Resp 18  Wt 205 lb 7.5 oz (93.2 kg)  SpO2 100% Physical Exam  Constitutional: He is oriented to person, place, and time. He appears well-developed and well-nourished. No distress.  HENT:  Head: Normocephalic and atraumatic.   Right Ear: External ear normal.  Left Ear: External ear normal.  Eyes: Conjunctivae are normal. Right eye exhibits no discharge. Left eye exhibits no discharge. No scleral icterus.  Neck: Neck supple. No tracheal deviation present.  Cardiovascular: Normal rate, regular rhythm and intact distal pulses.   Pulmonary/Chest: Effort normal and breath sounds normal. No stridor. No respiratory distress. He has no wheezes. He has no rales.  Abdominal: Soft. Bowel sounds are normal. He exhibits no distension. There is no tenderness. There is no rebound and no guarding.  Musculoskeletal: He exhibits no edema or tenderness.  Neurological: He is alert and oriented to person, place, and time. A cranial nerve deficit ( Left-sided facial droop, left-sided visual field defect) is present. No sensory deficit. He exhibits normal muscle tone. He displays no seizure activity. Coordination abnormal.  Patient has left-sided neglect, he also does have weakness on the right arm and right leg.  See Dr. Cecil Cobbs note for full neurologic evaluation  Skin: Skin is warm and dry. No rash noted.  Psychiatric: He has a normal mood and affect.  Nursing note and vitals reviewed.   ED Course  Procedures (including critical care time) Labs Review Labs Reviewed  DIFFERENTIAL - Abnormal; Notable for the following:    Monocytes Relative 14 (*)    Eosinophils Relative 6 (*)    All other components within normal limits  COMPREHENSIVE METABOLIC PANEL - Abnormal; Notable for the following:    Sodium 146 (*)    CO2 34 (*)    Albumin 3.4 (*)    GFR calc non Af Amer 66 (*)    GFR calc Af Amer 77 (*)    Anion gap 4 (*)    All other components within normal limits  PHENYTOIN LEVEL, TOTAL - Abnormal; Notable for the following:    Phenytoin Lvl 8.6 (*)    All other components within normal limits  I-STAT CHEM 8, ED - Abnormal; Notable for the following:    Calcium, Ion 1.09 (*)    All other components within normal limits   ETHANOL  PROTIME-INR  APTT  CBC  URINALYSIS, ROUTINE W REFLEX MICROSCOPIC  VALPROIC ACID LEVEL  URINE RAPID DRUG SCREEN (HOSP PERFORMED)  I-STAT TROPOININ, ED  I-STAT TROPOININ, ED  CBG MONITORING, ED    Imaging Review Ct Head Wo Contrast  10/02/2014   CLINICAL DATA:  64 year old male with right-sided weakness. History of prior cerebral vascular accident with left-sided deficits.  EXAM: CT HEAD WITHOUT CONTRAST  TECHNIQUE: Contiguous axial images were obtained from the base of the skull through the vertex without intravenous contrast.  COMPARISON:  Head CT 11/09/2013.  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 Mild cerebral atrophy. Patchy and confluent areas of decreased attenuation are noted throughout the deep and periventricular white matter of the cerebral hemispheres bilaterally, compatible with chronic microvascular ischemic disease. No acute intracranial abnormalities. Specifically, no evidence of acute intracranial hemorrhage, no definite findings of acute/subacute cerebral ischemia, no mass, mass effect, hydrocephalus or abnormal intra or extra-axial fluid collections. No acute displaced skull fractures are identified. Extensive mucosal thickening associated with the visualized terminates. Left maxillary sinus is nearly completely opacified.  IMPRESSION: 1. No acute intracranial abnormalities. 2. 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 3. Mild cerebral atrophy with extensive chronic microvascular ischemic changes in cerebral white matter redemonstrated. 4. Paranasal sinus disease, as above.   Electronically Signed   By: Vinnie Langton M.D.   On: 10/02/2014 17:27     EKG Interpretation   Date/Time:   Saturday October 02 2014 17:22:01 EST Ventricular Rate:  66 PR Interval:  178 QRS Duration: 85 QT Interval:  508 QTC Calculation: 532 R Axis:   -15 Text Interpretation:  Sinus rhythm Left ventricular hypertrophy Borderline  T abnormalities, lateral leads Prolonged QT interval , new since last  tracing Baseline wander in lead(s) II III aVF Confirmed by Izzy Courville  MD-J,  Bravlio Luca ([id number]) on 10/02/2014 7:40:14 PM      MDM   Final diagnoses:  Seizure    Pt was monitored in the ED.  No further episodes.  Seen by Dr Tobias Alexander , Stroke MD.  Symptoms felt to be related to a petit mal type seizure rather than an acute CVA.  Plan on discharging home with increase in his Vimpat dosing.  Plan and findings discussed with patient and family.    Dorie Rank, MD 10/02/14 2033

## 2014-10-02 NOTE — ED Notes (Addendum)
Per RRRN Langley Gauss, Q69min Vital signs until patient's son arrives and is able to speak with Dr. Leonel Ramsay.

## 2014-10-02 NOTE — Discharge Instructions (Signed)

## 2014-10-02 NOTE — ED Notes (Signed)
Dr Leonel Ramsay on phone with son.

## 2014-10-02 NOTE — Consult Note (Signed)
Neurology Consultation Reason for Consult: Seizures Referring Physician: Hillard Danker  CC: Episode of unresponsiveness  History is obtained from: Son  HPI: Craig [name] is a 64 y.o. male with a history of stroke and seizures who presents with an episode of unresponsiveness witnessed by his son. His son describes that he had been alone for about 20-30 minutes and when he went in there he found him slumped to the right and looking down. His eyes were open but he was unresponsive. He did not look at him or respond when he asked questions. After approximately 10 minutes he began responding. At that time his son asked if he remembered him yelling at him and the patient did not remember it.      ROS: A 14 point ROS was performed and is negative except as noted in the HPI.   Past Medical History  Diagnosis Date  . Hypertension   . Allergy     RHINITIS  . Diverticulosis   . Colonic polyp   . 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 Seizures   . Dementia     Family History: No history of seizures  Social History: Tob: Denies  Exam: Current vital signs: BP 154/91 mmHg  Pulse 60  Temp(Src) 97.8 F (36.6 C) (Oral)  Resp 12  Wt 93.2 kg (205 lb 7.5 oz)  SpO2 100% Vital signs in last 24 hours: Temp:  [97.8 F (36.6 C)] 97.8 F (36.6 C) (01/30 1830) Pulse Rate:  [57-117] 60 (01/30 2030) Resp:  [10-18] 12 (01/30 2030) BP: (136-165)/(77-94) 154/91 mmHg (01/30 2030) SpO2:  [92 %-100 %] 100 % (01/30 2030) Weight:  [93.2 kg (205 lb 7.5 oz)] 93.2 kg (205 lb 7.5 oz) (01/30 1725)   Physical Exam  Constitutional: Appears well-developed and well-nourished.  Psych: Affect appropriate to situation Eyes: No scleral injection HENT: No OP obstrucion Head: Normocephalic.  Cardiovascular: Normal rate and regular rhythm.  Respiratory: Effort normal and breath sounds normal to anterior ascultation GI: Soft.  No distension. There is no tenderness.  Skin:  WDI  Neuro: Mental Status: Patient is awake, alert, oriented to person, place,  situation. He does not give month or age correctly No signs of aphasia but he does have some neglect Cranial Nerves: II: Left hemianopia Pupils are equal, round, and reactive to light.   III,IV, VI: EOMI without ptosis or diploplia.  V: Facial sensation is symmetric to temperature VII: Facial movement is notable for some mild left droop VIII: hearing is intact to voice X: Uvula elevates symmetrically XI: Shoulder shrug is symmetric. XII: tongue is midline without atrophy or fasciculations.  Motor: Tone is normal. Bulk is normal. 5/5 strength was present on the right side, he has left-sided weakness Sensory: Sensation is decreased on left Deep Tendon Reflexes: 2+  biceps and patellae. On the right, 3+ on the left Cerebellar: Grossly ataxic on the left         I have reviewed labs in epic and the results pertinent to this consultation are: CMP-unremarkable  I have reviewed the images obtained: CT head-negative acute  Impression: 64 year old male with history of seizures who presents with transient alteration in consciousness. Depakote level is good. I would recommend increasing his Vimpat. Though his Dilantin level is slightly low given that he is also on Depakote there is, there is competition for protein binding and I suspect he is therapeutic.  Recommendations: 1) continue Depakote and phenytoin at home doses 2) increase Vimpat to 150  mg twice a day 3) no further recommendations at this time.   Roland Rack, MD [facility] [phone]  If 7pm- 7am, please page neurology on call as listed in [place].

## 2014-10-02 NOTE — ED Notes (Signed)
Per EMS, pt was found by himself slumped down in his wheelchair towards the right side. On ems arrival pt had right side weakness/deficits. Pt has hx of left sided cva. EMS reported right sided grips weaker than the left and right side drift.

## 2014-10-02 NOTE — ED Notes (Signed)
Son is now at the bedside.

## 2014-10-04 ENCOUNTER — Telehealth: Payer: Self-pay | Admitting: *Deleted

## 2014-10-04 NOTE — Telephone Encounter (Signed)
Pt spouse called to cancel this appt and stated that they will not be scheduling; due to, the pt had another seizure this past weekend...Craig KitchenMarland [name]

## 2014-10-05 ENCOUNTER — Ambulatory Visit: Payer: BC Managed Care – PPO

## 2014-10-05 ENCOUNTER — Telehealth: Payer: Self-pay | Admitting: Neurology

## 2014-10-05 NOTE — Telephone Encounter (Signed)
Patient's spouse, requesting an earlier appointment than 3/2 with Dr. Leonie Man, due to patient has a petite gran mal seizure on last Saturday.  Per spouse patient also had and second seizure last evening, where is was spaced out and unresponsive.  Please call and advise.

## 2014-10-06 ENCOUNTER — Telehealth: Payer: Self-pay | Admitting: Family Medicine

## 2014-10-06 NOTE — Telephone Encounter (Signed)
Please call Lost all mobility Has had 2 more seizures Still complaining about back pain  She wants to know if she needs to bring him in

## 2014-10-07 ENCOUNTER — Ambulatory Visit (INDEPENDENT_AMBULATORY_CARE_PROVIDER_SITE_OTHER): Payer: Medicare HMO | Admitting: Neurology

## 2014-10-07 ENCOUNTER — Telehealth: Payer: Self-pay | Admitting: Family Medicine

## 2014-10-07 ENCOUNTER — Encounter: Payer: Self-pay | Admitting: Neurology

## 2014-10-07 ENCOUNTER — Telehealth: Payer: Self-pay | Admitting: *Deleted

## 2014-10-07 VITALS — BP 182/94 | HR 70

## 2014-10-07 DIAGNOSIS — I639 Cerebral infarction, unspecified: Secondary | ICD-10-CM

## 2014-10-07 DIAGNOSIS — G40209 Localization-related (focal) (partial) symptomatic epilepsy and epileptic syndromes with complex partial seizures, not intractable, without status epilepticus: Secondary | ICD-10-CM

## 2014-10-07 NOTE — Procedures (Signed)
      History: Craig [name] is a 64 year old patient with a history of staring and unresponsiveness 5 days prior to this evaluation. The patient has a history of cerebrovascular disease and dementia. The patient being evaluated for possible seizure events.  This is a routine EEG. No skull defects are noted. Medications include Norvasc, aspirin, Bystolic, Depakote, Avapro, Vimpat, and Dilantin.  EEG classification: Dysrhythmia grade 1 generalized  Description of the recording: The background rhythms of this recording consists of a moderately well modulated background rhythm activity of approximately 7 Hz. As the record progresses, there appears to be intermittent 5 Hz theta slowing that is generalized in nature. Photic stimulation is performed, and this results in a bilateral minimal photic driving response. Hyperventilation was not performed. At no time during the recording does there appear to be evidence of spike or spike-wave discharges or evidence of focal slowing. EKG monitor shows no evidence of cardiac rhythm abnormalities with a heart rate of 60.  Impression: This is an abnormal EEG recording secondary to diffuse background theta slowing. This is a nonspecific recording, and can be seen with any process that results in a toxic or metabolic encephalopathy, or any dementing illness. No evidence of epileptiform discharges were seen.

## 2014-10-07 NOTE — Patient Instructions (Signed)
Overall you are doing fairly well but I do want to suggest a few things today:   Remember to drink plenty of fluid, eat healthy meals and do not skip any meals. Try to eat protein with a every meal and eat a healthy snack such as fruit or nuts in between meals. Try to keep a regular sleep-wake schedule and try to exercise daily, particularly in the form of walking, 20-30 minutes a day, if you can.   As far as your medications are concerned, I would like to suggest: Continue current doses  As far as diagnostic testing: EEG today, repeat MRi of the brain  I would like to see you back in 2 weeks, sooner if we need to. Please call us with any interim questions, concerns, problems, updates or refill requests.   Please also call us for any test results so we can go over those with you on the phone.  My clinical assistant and will answer any of your questions and relay your messages to me and also relay most of my messages to you.   Our phone number is [phone]. We also have an after hours call service for urgent matters and there is a physician on-call for urgent questions. For any emergencies you know to call 911 or go to the nearest emergency room

## 2014-10-07 NOTE — Progress Notes (Signed)
[facility]    Provider:  Dr Jaynee Eagles Referring Provider: Denita Lung, MD Primary Care Physician:  Wyatt Haste, MD  CC:  Seizure  HPI:  Craig [name] is a 64 y.o. male here as an add on patient. He is followed by Dr. Leonie Man. PMHx HTN, stroke with residual left-sided hemiparesis, seizures, dementia. He has been well managed on Vimpat, depakote and dilantin for almost a year. Patient's primary care doctor requested an urgent appointment today. Patient seen in the ED on Saturday after a period of unresponsiveness. The ED said he had a "petit mal" seizure. His Vimpat was increased. Per ED notes, recommendations were to continue his Depakote sprinkles and also continue his Dilantin. Wife thought they told her to discontinue the Dilantin and so she has not been giving it to patient. No one told her to stop the medication, she thought she read it on the discharge summary.  Wife provides most information. On Saturday patient was with his son and patient became unresponsive. Son said that patient was staring, was not responsive. Eyes were not rolled back, no loss of urine or bowels as far as they know (but he wears depends so unsure). Patient remembers the whole event, he remembers  not talking to son, remembers son getting down and in his face and son was shaking patient, he was saying "Dad, do you know you have to take this medication".Patient verbalizes the event. He doesn't remember if he was trying to talk or not or why he did not say anything. Son was shaking him. Wife had a similar episode with patient last week, patient was looking at wife and trying to talk but was not saying anything, he finally answered after a few minutes. Nothing unusual has been going on, no new medications or any inciting factors. Patient's seizures have been controlled for almost a year on current seizure medications. He has also been leaning to the right in his wheelchair, family doesn't know why and  are worried he has had another stroke.   Patient has a history of stroke,  On aspirin daily and a statin.   Reviewed notes, labs and imaging from outside physicians, which showed: Per ED notes, the son found his father slumped to the right and looking down, eyes open and unresponsive, tried to talk to him for 10 minutes without patient responding.   CT of the head 10/02/2014:  FINDINGS: Large area of low attenuation in the posterior right temporal lobe extending into the right occipital lobe and in the medial aspect of the superior right parietal lobe, similar to the prior examination, compatible with encephalomalacia from old right PCA territory infarction. Mild cerebral atrophy. Patchy and confluent areas of decreased attenuation are noted throughout the deep and periventricular white matter of the cerebral hemispheres bilaterally, compatible with chronic microvascular ischemic disease. No acute intracranial abnormalities. Specifically, no evidence of acute intracranial hemorrhage, no definite findings of acute/subacute cerebral ischemia, no mass, mass effect, hydrocephalus or abnormal intra or extra-axial fluid collections. No acute displaced skull fractures are identified. Extensive mucosal thickening associated with the visualized terminates. Left maxillary sinus is nearly completely opacified.  IMPRESSION: 1. No acute intracranial abnormalities. 2. 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 3. Mild cerebral atrophy with extensive chronic microvascular ischemic changes in cerebral white matter redemonstrated. 4. Paranasal sinus disease, as above.  MRI brain 2013:  IMPRESSION: Acute right posterior cerebral artery infarct.  Acute left superior cerebellar infarct.  Extensive chronic ischemic changes. Scattered areas  of chronic micro hemorrhage. These findings suggest chronic severe hypertension.  MRA HEAD 2013  Findings:  Right vertebral artery is patent to the basilar. PICA is not visualized bilaterally. Right AICA is visualized.  Left vertebral artery is small and has minimal flow and may be diffusely diseased or have low flow. Basilar is patent. Occlusion of the right posterior cerebral artery at the origin. Right superior cerebral artery is diseased but patent. Left superior cerebellar artery is occluded. Left posterior cerebral artery is patent.  Internal carotid artery is patent bilaterally without significant stenosis. Anterior and middle cerebral arteries are patent bilaterally. Atherosclerotic irregularity is present in the middle cerebral artery branches bilaterally with a moderately severe stenosis of the anterior division of the left middle cerebral artery. Moderate stenosis of the left A2 segment. Scattered disease in the right middle cerebral artery.  Negative for cerebral aneurysm.  IMPRESSION: Diffuse intracranial atherosclerotic disease.  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 Occlusion of the left superior cerebellar artery compatible with areas of acute infarction.  Review of Systems: Patient complains of symptoms per HPI as well as the following symptoms facial swelling. Pertinent negatives per HPI. All others negative.   History   Social History  . Marital Status: Married    Spouse Name: Craig [name]    Number of Children: 1  . Years of Education: college   Occupational History  . retired [employer]   Social History Main Topics  . Smoking status: Never Smoker   . Smokeless tobacco: Never Used  . Alcohol Use: No  . Drug Use: No  . Sexual Activity: Yes   Other Topics Concern  . Not on file   Social History Narrative   Patient lives at home with his wife  has 2 years of college and 1 child.    Patient works at  [employer].  Patient denies smoking tobacco,drinking alcohol and taking all illict drugs.    Patient is a caffeine drinker.   Patient is right handed.    Family History  Problem Relation Age of Onset  . Cancer Mother   . Arthritis Mother   . Heart disease Mother   . Prostate cancer Father   . Cancer Sister   . Colon cancer Neg Hx   . Rectal cancer Neg Hx   . Stomach cancer Neg Hx   . Stroke Brother     massive stroke     Past Medical History  Diagnosis Date  . Hypertension   . Allergy     RHINITIS  . Diverticulosis   . Colonic polyp   . 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 Seizures   . Dementia     History reviewed. No pertinent past surgical history.  Current Outpatient Prescriptions  Medication Sig Dispense Refill  . amLODipine (NORVASC) 10 MG tablet TAKE 1 TABLET BY MOUTH DAILY WITH BREAKFAST 30 tablet 11  . antiseptic oral rinse (BIOTENE) LIQD 15 mLs by Mouth Rinse route as needed for dry mouth. 437 mL 12  . aspirin EC 81 MG tablet Take 81 mg by mouth daily with breakfast.    . BYSTOLIC 10 MG tablet TAKE 1 TABLET BY MOUTH DAILY 30 tablet 11  . Dentifrices (BIOTENE DRY MOUTH) PSTE Place 1 application onto teeth as needed. 127.6 g 12  . Disposable Gloves MISC Use gloves as needed 100 each 12  . divalproex (DEPAKOTE SPRINKLE) 125 MG capsule Take 125 mg by mouth 4 (four) times daily.     Marland Kitchen  Incontinence Supply Disposable (RA ADULT WIPES) MISC 2 each by Does not apply route as needed. 128 each 12  . Incontinence Supply Disposable (SIMPLICITY ADULT BRIEF 87"-68") MISC 1 each by Does not apply route as needed. 60 each 12  . irbesartan (AVAPRO) 150 MG tablet TAKE 1 TABLET BY MOUTH DAILY 30 tablet 11  . Lacosamide (VIMPAT) 10 MG/ML SOLN Take 12.5 mLs (125 mg total) by mouth 2 (two) times daily. 600 mL 5  . phenytoin (DILANTIN) 50 MG tablet Chew 50 mg by mouth 2 (two) times daily. Chew tablet     No current facility-administered medications for this visit.    Allergies as of 10/07/2014  . (No Known Allergies)    Vitals: BP 182/94 mmHg  Pulse 70  Ht   Wt  Last  Weight:  Wt Readings from Last 1 Encounters:  10/02/14 205 lb 7.5 oz (93.2 kg)   Last Height:   Ht Readings from Last 1 Encounters:  03/10/14 5\' 9"  (1.753 m)    Neuro: Detailed Neurologic Exam  Speech:    No aphasia or dysarthria  Cognition:    The patient is oriented to person. Cranial Nerves:    The pupils are equal, round. Left hemianopia. Extraocular movements are intact. Mild right NL flateening. The palate elevates in the midline. Hearing appears  intact. Voice is normal. Shoulder shrug is normal. The tongue has normal motion without fasciculations.    Motor Observation:    no involuntary movements noted. Tone:    Normal muscle tone.    Posture:    Slumped to the right in wheelchair but can use his right arm to lift himself straight if asked to do so. Then leans back to the right again.     Strength:     Weakness both hip flexors 3+/5 Left delt and triceps weakness 4/5 Bilat HS weakness 4+/5 Left drift.       Assessment/Plan:  Schuyler Olden is a 64 y.o. male here as an add on patient. He is followed by Dr. Leonie Man. PMHx HTN, stroke with residual left-sided hemiparesis, seizures, dementia. Was seen in the ED Saturday for episode of unresponsiveness. He is still not back to baseline and has new right-sided deficits (leaning to the right). Patient is leaning to the right during exam today however exam appears to be mostly stable compared to 03/10/2014 exam by Charlott Holler and Dr. Leonie Man. I do notice a right-sided NL flattening that was not documented before however could be normal facial asymmetry - still will repeat MRI of the brain. Vimpat was increased in the ED Saturday. Instructions were to continue Depakote and phenytoin however some miscommunication occurred and wife stopped giving the Phenytoim  Recommendations: 1) continue Depakote and  Restart phenytoin at home doses 2) Continue Vimpat at increased dosage 3) EEG today in clinic 4) Will repeat MRI due to new right-sided  symptoms  Sarina Ill, MD  [facility], [facility] [address] [place], [place] [address]  Phone [phone] Fax [phone]  A total of 40 minutes was spent face-to-face with this patient. Over half this time was spent on counseling patient on the seizure diagnosis and different diagnostic and therapeutic options available.

## 2014-10-07 NOTE — Telephone Encounter (Signed)
Spoke with patient's wife and scheduled patient with NP MM due to Dr Leonie Man does not have any availability, she accepted appointment for 10/11/14 at 2:30. Craig [name]

## 2014-10-07 NOTE — Telephone Encounter (Signed)
Dr Redmond School requested patient to be seen today by [facility].  Pt has appt at 12:30 with Dr. Jaynee Eagles and has EEG at 2:00 .  I have spoken with wife and she is aware.

## 2014-10-07 NOTE — Telephone Encounter (Signed)
[facility] called states that patient has MRI on tomorrow and that it needs prior authorization. CPT 8921, MRI Brain w/o contrast

## 2014-10-07 NOTE — Telephone Encounter (Signed)
Patient's wife is calling again because she did not get a returned call and states her husband needs to be seen. Patient's wife is upset because she did not get a returned call. Please call. Thank you.

## 2014-10-08 ENCOUNTER — Encounter (HOSPITAL_COMMUNITY): Payer: Self-pay

## 2014-10-08 ENCOUNTER — Inpatient Hospital Stay (HOSPITAL_COMMUNITY)
Admission: EM | Admit: 2014-10-08 | Discharge: 2014-10-15 | DRG: 065 | Disposition: A | Discharge status: Skilled Nursing Facility | Payer: Medicare HMO | Attending: Internal Medicine | Admitting: Internal Medicine

## 2014-10-08 DIAGNOSIS — G40209 Localization-related (focal) (partial) symptomatic epilepsy and epileptic syndromes with complex partial seizures, not intractable, without status epilepticus: Secondary | ICD-10-CM | POA: Diagnosis present

## 2014-10-08 DIAGNOSIS — I6529 Occlusion and stenosis of unspecified carotid artery: Secondary | ICD-10-CM | POA: Diagnosis present

## 2014-10-08 DIAGNOSIS — Z79899 Other long term (current) drug therapy: Secondary | ICD-10-CM | POA: Diagnosis not present

## 2014-10-08 DIAGNOSIS — I63422 Cerebral infarction due to embolism of left anterior cerebral artery: Principal | ICD-10-CM | POA: Diagnosis present

## 2014-10-08 DIAGNOSIS — G8191 Hemiplegia, unspecified affecting right dominant side: Secondary | ICD-10-CM | POA: Diagnosis present

## 2014-10-08 DIAGNOSIS — R531 Weakness: Secondary | ICD-10-CM | POA: Diagnosis present

## 2014-10-08 DIAGNOSIS — R001 Bradycardia, unspecified: Secondary | ICD-10-CM | POA: Diagnosis present

## 2014-10-08 DIAGNOSIS — G40909 Epilepsy, unspecified, not intractable, without status epilepticus: Secondary | ICD-10-CM | POA: Diagnosis present

## 2014-10-08 DIAGNOSIS — E785 Hyperlipidemia, unspecified: Secondary | ICD-10-CM | POA: Diagnosis present

## 2014-10-08 DIAGNOSIS — I10 Essential (primary) hypertension: Secondary | ICD-10-CM

## 2014-10-08 DIAGNOSIS — Z7982 Long term (current) use of aspirin: Secondary | ICD-10-CM | POA: Diagnosis not present

## 2014-10-08 DIAGNOSIS — F0150 Vascular dementia without behavioral disturbance: Secondary | ICD-10-CM | POA: Diagnosis present

## 2014-10-08 DIAGNOSIS — H53462 Homonymous bilateral field defects, left side: Secondary | ICD-10-CM | POA: Diagnosis present

## 2014-10-08 DIAGNOSIS — I69354 Hemiplegia and hemiparesis following cerebral infarction affecting left non-dominant side: Secondary | ICD-10-CM | POA: Diagnosis not present

## 2014-10-08 DIAGNOSIS — H5347 Heteronymous bilateral field defects: Secondary | ICD-10-CM | POA: Diagnosis present

## 2014-10-08 DIAGNOSIS — I639 Cerebral infarction, unspecified: Secondary | ICD-10-CM | POA: Diagnosis present

## 2014-10-08 DIAGNOSIS — Z823 Family history of stroke: Secondary | ICD-10-CM | POA: Diagnosis not present

## 2014-10-08 DIAGNOSIS — R569 Unspecified convulsions: Secondary | ICD-10-CM

## 2014-10-08 LAB — DIFFERENTIAL
Basophils Absolute: 0 10*3/uL (ref 0.0–0.1)
Basophils Relative: 0 % (ref 0–1)
EOS PCT: 4 % (ref 0–5)
Eosinophils Absolute: 0.2 10*3/uL (ref 0.0–0.7)
Lymphocytes Relative: 18 % (ref 12–46)
Lymphs Abs: 1.2 10*3/uL (ref 0.7–4.0)
MONO ABS: 0.9 10*3/uL (ref 0.1–1.0)
MONOS PCT: 14 % — AB (ref 3–12)
Neutro Abs: 4.1 10*3/uL (ref 1.7–7.7)
Neutrophils Relative %: 64 % (ref 43–77)

## 2014-10-08 LAB — COMPREHENSIVE METABOLIC PANEL
ALK PHOS: 58 U/L (ref 39–117)
ALT: 17 U/L (ref 0–53)
AST: 16 U/L (ref 0–37)
Albumin: 3.4 g/dL — ABNORMAL LOW (ref 3.5–5.2)
Anion gap: 8 (ref 5–15)
BUN: 9 mg/dL (ref 6–23)
CO2: 31 mmol/L (ref 19–32)
Calcium: 8.9 mg/dL (ref 8.4–10.5)
Chloride: 103 mmol/L (ref 96–112)
Creatinine, Ser: 1.06 mg/dL (ref 0.50–1.35)
GFR calc Af Amer: 85 mL/min — ABNORMAL LOW (ref >90)
GFR, EST NON AFRICAN AMERICAN: 73 mL/min — AB (ref >90)
GLUCOSE: 103 mg/dL — AB (ref 70–99)
Potassium: 4 mmol/L (ref 3.5–5.1)
SODIUM: 142 mmol/L (ref 135–145)
TOTAL PROTEIN: 6.3 g/dL (ref 6.0–8.3)
Total Bilirubin: 0.4 mg/dL (ref 0.3–1.2)

## 2014-10-08 LAB — CBC
HEMATOCRIT: 47.5 % (ref 39.0–52.0)
HEMOGLOBIN: 15.4 g/dL (ref 13.0–17.0)
MCH: 29.8 pg (ref 26.0–34.0)
MCHC: 32.4 g/dL (ref 30.0–36.0)
MCV: 91.9 fL (ref 78.0–100.0)
Platelets: 201 10*3/uL (ref 150–400)
RBC: 5.17 MIL/uL (ref 4.22–5.81)
RDW: 13.1 % (ref 11.5–15.5)
WBC: 6.3 10*3/uL (ref 4.0–10.5)

## 2014-10-08 LAB — I-STAT CHEM 8, ED
BUN: 12 mg/dL (ref 6–23)
CALCIUM ION: 1.16 mmol/L (ref 1.13–1.30)
Chloride: 103 mmol/L (ref 96–112)
Creatinine, Ser: 1 mg/dL (ref 0.50–1.35)
Glucose, Bld: 97 mg/dL (ref 70–99)
HEMATOCRIT: 50 % (ref 39.0–52.0)
HEMOGLOBIN: 17 g/dL (ref 13.0–17.0)
Potassium: 4 mmol/L (ref 3.5–5.1)
Sodium: 143 mmol/L (ref 135–145)
TCO2: 28 mmol/L (ref 0–100)

## 2014-10-08 LAB — I-STAT TROPONIN, ED: Troponin i, poc: 0 ng/mL (ref 0.00–0.08)

## 2014-10-08 LAB — PROTIME-INR
INR: 0.98 (ref 0.00–1.49)
Prothrombin Time: 13.1 seconds (ref 11.6–15.2)

## 2014-10-08 LAB — CBG MONITORING, ED: GLUCOSE-CAPILLARY: 107 mg/dL — AB (ref 70–99)

## 2014-10-08 LAB — APTT: APTT: 34 s (ref 24–37)

## 2014-10-08 MED ORDER — ASPIRIN 325 MG PO TABS
325.0000 mg | ORAL_TABLET | Freq: Every day | ORAL | Status: DC
  Administered 2014-10-08: 325 mg via ORAL
  Filled 2014-10-08 (×2): qty 1

## 2014-10-08 MED ORDER — HYDRALAZINE HCL 20 MG/ML IJ SOLN: 10.0000 mg | Freq: Four times a day (QID) | INTRAMUSCULAR | Status: DC | PRN

## 2014-10-08 MED ORDER — SENNOSIDES-DOCUSATE SODIUM 8.6-50 MG PO TABS: 1.0000 | ORAL_TABLET | Freq: Every evening | ORAL | Status: DC | PRN

## 2014-10-08 MED ORDER — LACOSAMIDE 50 MG PO TABS
125.0000 mg | ORAL_TABLET | Freq: Two times a day (BID) | ORAL | Status: DC
  Administered 2014-10-08 – 2014-10-14 (×12): 125 mg via ORAL
  Filled 2014-10-08 (×12): qty 3

## 2014-10-08 MED ORDER — ASPIRIN EC 81 MG PO TBEC: 81.0000 mg | DELAYED_RELEASE_TABLET | Freq: Every day | ORAL | Status: DC

## 2014-10-08 MED ORDER — ASPIRIN 300 MG RE SUPP
300.0000 mg | Freq: Every day | RECTAL | Status: DC
Start: 2014-10-08 — End: 2014-10-09

## 2014-10-08 MED ORDER — STROKE: EARLY STAGES OF RECOVERY BOOK
Freq: Once | Status: AC
  Administered 2014-10-08: 1

## 2014-10-08 MED ORDER — PHENYTOIN 50 MG PO CHEW
100.0000 mg | CHEWABLE_TABLET | Freq: Two times a day (BID) | ORAL | Status: DC
  Administered 2014-10-08 – 2014-10-14 (×12): 100 mg via ORAL
  Filled 2014-10-08 (×15): qty 2

## 2014-10-08 MED ORDER — DIVALPROEX SODIUM 125 MG PO CPSP
500.0000 mg | ORAL_CAPSULE | Freq: Three times a day (TID) | ORAL | Status: DC
  Administered 2014-10-08 – 2014-10-14 (×18): 500 mg via ORAL
  Filled 2014-10-08 (×18): qty 4

## 2014-10-08 MED ORDER — ENOXAPARIN SODIUM 40 MG/0.4ML ~~LOC~~ SOLN
40.0000 mg | SUBCUTANEOUS | Status: DC
  Administered 2014-10-09 – 2014-10-14 (×6): 40 mg via SUBCUTANEOUS
  Filled 2014-10-08 (×6): qty 0.4

## 2014-10-08 NOTE — ED Notes (Addendum)
Pt presents with report of L sided weakness that pt reports is since Saturday (6 days).  Pt is alert, oriented but slow to respond.  Pt seen here for same then and discharged.  Pt seen at neurologist, had MRI today and referred here.

## 2014-10-08 NOTE — ED Notes (Signed)
Family at bedside. 

## 2014-10-08 NOTE — H&P (Signed)
[facility] History and Physical  Craig [name] AVW:[id number] DOB: [birth year] DOA: 10/08/2014  Referring physician:  PCP: Craig Haste, MD  Specialists:   Chief Complaint: R sided leg weakness   HPI: Craig [name] is a 64 y.o. male with PMH of HTN, h/o CVAs-residual L sided weakness, Seizures recently seen in ED (6 days ago) for staring spells felt to be related to seizures evaluated at outpatient neurology office yesterday for with EEG, MRI and found to have subacute L parietal lobe CVA; Patient reports chronic L sided weakness, but recently developed R sided leg weakness with inability to ambulate; denies paraesthesia, no headaches, no change in speech, no dysphasia;   Review of Systems: The patient denies anorexia, fever, weight loss,, vision loss, decreased hearing, hoarseness, chest pain, syncope, dyspnea on exertion, peripheral edema, balance deficits, hemoptysis, abdominal pain, melena, hematochezia, severe indigestion/heartburn, hematuria, incontinence, genital sores, muscle weakness, suspicious skin lesions, transient blindness, difficulty walking, depression, unusual weight change, abnormal bleeding, enlarged lymph nodes, angioedema, and breast masses.    Past Medical History  Diagnosis Date  . Hypertension   . Allergy     RHINITIS  . Diverticulosis   . Colonic polyp   . 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 Seizures   . Dementia    History reviewed. No pertinent past surgical history. Social History:  reports that he has never smoked. He has never used smokeless tobacco. He reports that he does not drink alcohol or use illicit drugs. Home; where does patient live--home, ALF, SNF? and with whom if at home? No; Can patient participate in ADLs?  No Known Allergies  Family History  Problem Relation Age of Onset  . Cancer Mother   . Arthritis Mother   . Heart disease Mother   . Prostate cancer Father   . Cancer Sister   . Colon  cancer Neg Hx   . Rectal cancer Neg Hx   . Stomach cancer Neg Hx   . Stroke Brother     massive stroke     (be sure to complete)  Prior to Admission medications   Medication Sig Start Date End Date Taking? Authorizing Provider  amLODipine (NORVASC) 10 MG tablet TAKE 1 TABLET BY MOUTH DAILY WITH BREAKFAST    Denita Lung, MD  antiseptic oral rinse (BIOTENE) LIQD 15 mLs by Mouth Rinse route as needed for dry mouth. 09/21/14   Denita Lung, MD  aspirin EC 81 MG tablet Take 81 mg by mouth daily with breakfast.    Historical Provider, MD  BYSTOLIC 10 MG tablet TAKE 1 TABLET BY MOUTH DAILY 04/12/14   Denita Lung, MD  Dentifrices (BIOTENE DRY MOUTH) PSTE Place 1 application onto teeth as needed. 09/21/14   Denita Lung, MD  Disposable Gloves MISC Use gloves as needed 09/21/14   Denita Lung, MD  divalproex (DEPAKOTE SPRINKLE) 125 MG capsule Take 125 mg by mouth 4 (four) times daily.     Historical Provider, MD  Incontinence Supply Disposable (RA ADULT WIPES) MISC 2 each by Does not apply route as needed. 09/21/14   Denita Lung, MD  Incontinence Supply Disposable (SIMPLICITY ADULT BRIEF 82"-95") MISC 1 each by Does not apply route as needed. 09/21/14   Denita Lung, MD  irbesartan (AVAPRO) 150 MG tablet TAKE 1 TABLET BY MOUTH DAILY    Denita Lung, MD  Lacosamide (VIMPAT) 10 MG/ML SOLN Take 12.5 mLs (125 mg total) by mouth 2 (two) times daily.  10/02/14   Dorie Rank, MD  phenytoin (DILANTIN) 50 MG tablet Chew 50 mg by mouth 2 (two) times daily. Chew tablet    Historical Provider, MD   Physical Exam: Filed Vitals:   10/08/14 1715  BP: 151/86  Pulse: 57  Temp:   Resp: 19     General:  Alert, oriented   Eyes: eom-i, L hemianopsia   ENT: no orla ulcers   Neck: supple   Cardiovascular: s1,s2 rrr  Respiratory: CTA BL  Abdomen: soft, nt,nd   Skin: no rash   Musculoskeletal: no leg edema  Psychiatric: no hallucinations   Neurologic: L hemianopsia, otherwise CN 2-12  intact; motor: R upper extremity 5/5, R LE 3/5; L UE 3/5, L LE 3/5  Labs on Admission:  Basic Metabolic Panel:  Recent Labs Lab 10/02/14 1709 10/02/14 1711 10/08/14 1459 10/08/14 1525  NA 144 146* 142 143  K 4.0 4.2 4.0 4.0  CL 104 108 103 103  CO2  --  34* 31  --   GLUCOSE 88 90 103* 97  BUN 16 13 9 12   CREATININE 1.10 1.15 1.06 1.00  CALCIUM  --  8.7 8.9  --    Liver Function Tests:  Recent Labs Lab 10/02/14 1711 10/08/14 1459  AST 18 16  ALT 21 17  ALKPHOS 58 58  BILITOT 0.3 0.4  PROT 6.2 6.3  ALBUMIN 3.4* 3.4*   No results for input(s): LIPASE, AMYLASE in the last 168 hours. No results for input(s): AMMONIA in the last 168 hours. CBC:  Recent Labs Lab 10/02/14 1709 10/02/14 1711 10/08/14 1459 10/08/14 1525  WBC  --  6.1 6.3  --   NEUTROABS  --  3.5 4.1  --   HGB 16.7 15.0 15.4 17.0  HCT 49.0 46.8 47.5 50.0  MCV  --  90.7 91.9  --   PLT  --  196 201  --    Cardiac Enzymes: No results for input(s): CKTOTAL, CKMB, CKMBINDEX, TROPONINI in the last 168 hours.  BNP (last 3 results) No results for input(s): BNP in the last 8760 hours.  ProBNP (last 3 results) No results for input(s): PROBNP in the last 8760 hours.  CBG:  Recent Labs Lab 10/02/14 1724 10/08/14 1656  GLUCAP 93 107*    Radiological Exams on Admission: No results found.  EKG: Independently reviewed.   Assessment/Plan Active Problems:   CVA (cerebral infarction)   Homonymous hemianopia, left   64 y.o. male with PMH of HTN, h/o CVAs-residual L sided weakness, Seizures recently seen in ED (6 days ago) for staring spells felt to be related to seizures at that time evaluated at outpatient neurology office yesterday for with EEG, MRI and found to have subacute L parietal lobe CVA;   1. L parietal lobe subacute CVA; MRI ([facility], report in the chart): Subacute L parietal post lobe infarct, large old R PCA infarct, old lacunar, pons, R basal ganglia infarct;  -obtain CVA work up; consult  stroke team; cont ASA, may need to add plavix; likely needs holter or loop recorder upon discharge   2. Seizures likely due to large CVA/encephalomalasia; cont antiepileptics;  3. HTN, allow mild permissive HTN; resume amlodipine AM; may need to reduce BB due to mild bradycardia    Neurology;  if consultant consulted, please document name and whether formally or informally consulted  Code Status: full (must indicate code status--if unknown or must be presumed, indicate so) Family Communication: d/w patient, his wife (indicate person spoken with, if  applicable, with phone number if by telephone) Disposition Plan: pend PT eval  (indicate anticipated LOS)  Time spent: >35 minutes   Kinnie Feil [facility] Pager [phone]  If 7PM-7AM, please contact night-coverage [email or web address] Password [facility] 10/08/2014, 5:23 PM

## 2014-10-08 NOTE — Consult Note (Addendum)
Referring Physician: ED    Chief Complaint: worsening right hemiparesis, inability to walk, stroke on outside MRI.  HPI:                                                                                                                                         Craig [name] is an 64 y.o. 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 Wife provides most of the history and tells me that last Saturday she brought him to this ED because " she was staring off, was poorly responsive, and his right side stop working to the point that he couldn't ambulate". ED thought that he had a "petit mal" seizure and his Vimpat was increased.  I did review the detailed note from 2/4 by Dr. Jaynee Eagles at [facility]. Had EEG  That same day that was reported as " abnormal EEG recording secondary to diffuse background theta slowing. This is a nonspecific recording, and can be seen with any process that results in a toxic or metabolic encephalopathy, or any dementing illness. No evidence of epileptiform discharges were seen". In addition, MRI brain was done and revealed " a small posterior parietal infarct". As per wife, he did not have any seizures since last March until last Saturday. Takes Depakote, Vimpat, and Dilantin. Patient is now unable to walk. Denies HA, vertigo, double vision, slurred speech, or language impairment. Said that he can not see well on the left visual field. On aspirin. Date last known well: uncertain Time last known well: uncertain tPA Given: no, out of the window   Past Medical History  Diagnosis Date  . Hypertension   . Allergy     RHINITIS  . Diverticulosis   .  Colonic polyp   . 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 Seizures   . Dementia     History reviewed. No pertinent past surgical history.  Family History  Problem Relation Age of Onset  . Cancer Mother   . Arthritis Mother   . Heart disease Mother   . Prostate cancer Father   . Cancer Sister   . Colon cancer Neg Hx   . Rectal cancer Neg Hx   . Stomach cancer Neg Hx   . Stroke Brother     massive stroke   Family history: no brain tumors, brain aneurysms, or epilepsy Social History:  reports that he has never smoked. He has never used smokeless tobacco. He reports that he does not drink alcohol or use illicit drugs.  Allergies: No Known Allergies  Medications:  I have reviewed the patient's current medications.  ROS:                                                                                                                                       History obtained from wife, chart, and the patient  General ROS: negative for - chills, fatigue, fever, night sweats, or weight loss Psychological ROS: negative for - behavioral disorder, hallucinations, mood swings or suicidal ideation Ophthalmic ROS: negative for - blurry vision, double vision, eye pain ENT ROS: negative for - epistaxis, nasal discharge, oral lesions, sore throat, tinnitus or vertigo Allergy and Immunology ROS: negative for - hives or itchy/watery eyes Hematological and Lymphatic ROS: negative for - bleeding problems, bruising or swollen lymph nodes Endocrine ROS: negative for - galactorrhea, hair pattern changes, polydipsia/polyuria or temperature intolerance Respiratory ROS: negative for - cough, hemoptysis, shortness of breath or wheezing Cardiovascular ROS: negative for - chest pain, dyspnea on exertion, edema or irregular heartbeat Gastrointestinal ROS: negative for  - abdominal pain, diarrhea, hematemesis, nausea/vomiting or stool incontinence Genito-Urinary ROS: negative for - dysuria, hematuria, incontinence or urinary frequency/urgency Musculoskeletal ROS: negative for - joint swelling  Neurological ROS: as noted in HPI Dermatological ROS: negative for rash and skin lesion changes  Physical exam: pleasant male in no apparent distress. Blood pressure 151/86, pulse 57, temperature 99.1 F (37.3 C), temperature source Oral, resp. rate 19, SpO2 100 %. Head: normocephalic. Neck: supple, no bruits, no JVD. Cardiac: no murmurs. Lungs: clear. Abdomen: soft, no tender, no mass. Extremities: no edema. Skin: no edema Neurologic Examination:                                                                                                      General: Mental Status: Alert, oriented to place-year- and person.  Speech fluent without evidence of aphasia.  Able to follow 3 step commands without difficulty. Cranial Nerves: II: Discs flat bilaterally; left homonymous hemianopia, pupils equal, round, reactive to light and accommodation III,IV, VI: ptosis not present, extra-ocular motions intact bilaterally V,VII: smile asymmetric due to mild right face weakness, facial light touch sensation normal bilaterally VIII: hearing normal bilaterally IX,X: gag reflex present XI: bilateral shoulder shrug XII: midline tongue extension without atrophy or fasciculations Motor: Right hemiparesis, mild Sensory: ? Pinprick and light touch diminished in the left Deep Tendon Reflexes:  2 all over Plantars: Right: downgoing   Left: downgoing Cerebellar: No tested Gait: No tested for safety reasons    Results for  orders placed or performed during the hospital encounter of 10/08/14 (from the past 48 hour(s))  Protime-INR     Status: None   Collection Time: 10/08/14  2:59 PM  Result Value Ref Range   Prothrombin Time 13.1 11.6 - 15.2 seconds   INR 0.98 0.00 - 1.49  APTT      Status: None   Collection Time: 10/08/14  2:59 PM  Result Value Ref Range   aPTT 34 24 - 37 seconds  CBC     Status: None   Collection Time: 10/08/14  2:59 PM  Result Value Ref Range   WBC 6.3 4.0 - 10.5 K/uL   RBC 5.17 4.22 - 5.81 MIL/uL   Hemoglobin 15.4 13.0 - 17.0 g/dL   HCT 47.5 39.0 - 52.0 %   MCV 91.9 78.0 - 100.0 fL   MCH 29.8 26.0 - 34.0 pg   MCHC 32.4 30.0 - 36.0 g/dL   RDW 13.1 11.5 - 15.5 %   Platelets 201 150 - 400 K/uL  Differential     Status: Abnormal   Collection Time: 10/08/14  2:59 PM  Result Value Ref Range   Neutrophils Relative % 64 43 - 77 %   Neutro Abs 4.1 1.7 - 7.7 K/uL   Lymphocytes Relative 18 12 - 46 %   Lymphs Abs 1.2 0.7 - 4.0 K/uL   Monocytes Relative 14 (H) 3 - 12 %   Monocytes Absolute 0.9 0.1 - 1.0 K/uL   Eosinophils Relative 4 0 - 5 %   Eosinophils Absolute 0.2 0.0 - 0.7 K/uL   Basophils Relative 0 0 - 1 %   Basophils Absolute 0.0 0.0 - 0.1 K/uL  Comprehensive metabolic panel     Status: Abnormal   Collection Time: 10/08/14  2:59 PM  Result Value Ref Range   Sodium 142 135 - 145 mmol/L   Potassium 4.0 3.5 - 5.1 mmol/L   Chloride 103 96 - 112 mmol/L   CO2 31 19 - 32 mmol/L   Glucose, Bld 103 (H) 70 - 99 mg/dL   BUN 9 6 - 23 mg/dL   Creatinine, Ser 1.06 0.50 - 1.35 mg/dL   Calcium 8.9 8.4 - 10.5 mg/dL   Total Protein 6.3 6.0 - 8.3 g/dL   Albumin 3.4 (L) 3.5 - 5.2 g/dL   AST 16 0 - 37 U/L   ALT 17 0 - 53 U/L   Alkaline Phosphatase 58 39 - 117 U/L   Total Bilirubin 0.4 0.3 - 1.2 mg/dL   GFR calc non Af Amer 73 (L) >90 mL/min   GFR calc Af Amer 85 (L) >90 mL/min    Comment: (NOTE) The eGFR has been calculated using the CKD EPI equation. This calculation has not been validated in all clinical situations. eGFR's persistently <90 mL/min signify possible Chronic Kidney Disease.    Anion gap 8 5 - 15  I-stat troponin, ED (not at [facility])     Status: None   Collection Time: 10/08/14  3:24 PM  Result Value Ref Range   Troponin i, poc  0.00 0.00 - 0.08 ng/mL   Comment 3            Comment: Due to the release kinetics of cTnI, a negative result within the first hours of the onset of symptoms does not rule out myocardial infarction with certainty. If myocardial infarction is still suspected, repeat the test at appropriate intervals.   I-Stat Chem 8, ED     Status: None  Collection Time: 10/08/14  3:25 PM  Result Value Ref Range   Sodium 143 135 - 145 mmol/L   Potassium 4.0 3.5 - 5.1 mmol/L   Chloride 103 96 - 112 mmol/L   BUN 12 6 - 23 mg/dL   Creatinine, Ser 1.00 0.50 - 1.35 mg/dL   Glucose, Bld 97 70 - 99 mg/dL   Calcium, Ion 1.16 1.13 - 1.30 mmol/L   TCO2 28 0 - 100 mmol/L   Hemoglobin 17.0 13.0 - 17.0 g/dL   HCT 50.0 39.0 - 52.0 %  CBG monitoring, ED     Status: Abnormal   Collection Time: 10/08/14  4:56 PM  Result Value Ref Range   Glucose-Capillary 107 (H) 70 - 99 mg/dL   No results found.   Assessment: 64 y.o. male with worsening right hemiparesis, inability to walk, and MRI brain done at another facility revealing a small left posterior parietal infarct. Patient unable to walk at this moment, thus agreed to admit to the hospital. Last stroke was 3 years ago and thus will repeat stroke work up. PT. Aspirin pending neuro testing results. Stroke team will follow up in the morning.  Stroke Risk Factors -  HTN, prior strokes  Plan: 1. HgbA1c, fasting lipid panel 2. MRI, MRA  of the brain without contrast (done) 3. Echocardiogram 4. Carotid dopplers 5. Prophylactic therapy-aspirin after passing swallowing evaluation 6. Risk factor modification 7. Telemetry monitoring 8. Frequent neuro checks 9. PT/OT SLP  Dorian Pod, MD [facility] [phone]  10/08/2014, 5:21 PM

## 2014-10-08 NOTE — ED Provider Notes (Addendum)
CSN: [id number]     Arrival date & time 10/08/14  1439 History   First MD Initiated Contact with Patient 10/08/14 1610     No chief complaint on file.    (Consider location/radiation/quality/duration/timing/severity/associated sxs/prior Treatment) HPI Comments: Patient has a history significant for multiple strokes and seizure disorder on multiple therapies who presented to the emergency room approximately 6 days ago after a staring spell. At that time there was nothing acute found he was evaluated by neurology as well and felt that this was a seizure. Since that time patient followed up with his outpatient neurologist yesterday and had an EEG that was without acute findings and had an MRI today. On patient's MRI showed that he had a subacute stroke in his left parietal lobe. Since Saturday wife states that patient has had new weakness in his right leg and is unable to stand. Prior to Saturday patient had chronic weakness on the left upper and lower extremity but was able to stand and pivot with assistance. Now they're picking him up completely. Wife denies any difficulty with swallowing however states that he has had trouble forming words. She said initially they had stopped his Dilantin but in no neurologist office on Thursday he restarted it because she misunderstood the instructions.  The history is provided by the spouse.    Past Medical History  Diagnosis Date  . Hypertension   . Allergy     RHINITIS  . Diverticulosis   . Colonic polyp   . 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 Seizures   . Dementia    History reviewed. No pertinent past surgical history. Family History  Problem Relation Age of Onset  . Cancer Mother   . Arthritis Mother   . Heart disease Mother   . Prostate cancer Father   . Cancer Sister   . Colon cancer Neg Hx   . Rectal cancer Neg Hx   . Stomach cancer Neg Hx   . Stroke Brother     massive stroke    History  Substance Use  Topics  . Smoking status: Never Smoker   . Smokeless tobacco: Never Used  . Alcohol Use: No    Review of Systems  All other systems reviewed and are negative.     Allergies  Review of patient's allergies indicates no known allergies.  Home Medications   Prior to Admission medications   Medication Sig Start Date End Date Taking? Authorizing Provider  amLODipine (NORVASC) 10 MG tablet TAKE 1 TABLET BY MOUTH DAILY WITH BREAKFAST    Denita Lung, MD  antiseptic oral rinse (BIOTENE) LIQD 15 mLs by Mouth Rinse route as needed for dry mouth. 09/21/14   Denita Lung, MD  aspirin EC 81 MG tablet Take 81 mg by mouth daily with breakfast.    Historical Provider, MD  BYSTOLIC 10 MG tablet TAKE 1 TABLET BY MOUTH DAILY 04/12/14   Denita Lung, MD  Dentifrices (BIOTENE DRY MOUTH) PSTE Place 1 application onto teeth as needed. 09/21/14   Denita Lung, MD  Disposable Gloves MISC Use gloves as needed 09/21/14   Denita Lung, MD  divalproex (DEPAKOTE SPRINKLE) 125 MG capsule Take 125 mg by mouth 4 (four) times daily.     Historical Provider, MD  Incontinence Supply Disposable (RA ADULT WIPES) MISC 2 each by Does not apply route as needed. 09/21/14   Denita Lung, MD  Incontinence Supply Disposable (SIMPLICITY ADULT BRIEF 08"-67") MISC 1  each by Does not apply route as needed. 09/21/14   Denita Lung, MD  irbesartan (AVAPRO) 150 MG tablet TAKE 1 TABLET BY MOUTH DAILY    Denita Lung, MD  Lacosamide (VIMPAT) 10 MG/ML SOLN Take 12.5 mLs (125 mg total) by mouth 2 (two) times daily. 10/02/14   Dorie Rank, MD  phenytoin (DILANTIN) 50 MG tablet Chew 50 mg by mouth 2 (two) times daily. Chew tablet    Historical Provider, MD   BP 140/92 mmHg  Pulse 58  Temp(Src) 99.1 F (37.3 C) (Oral)  Resp 18  SpO2 97% Physical Exam  Constitutional: He is oriented to person, place, and time. He appears well-developed and well-nourished. No distress.  HENT:  Head: Normocephalic and atraumatic.  Mouth/Throat:  Oropharynx is clear and moist.  Eyes: Conjunctivae and EOM are normal. Pupils are equal, round, and reactive to light.  Neck: Normal range of motion. Neck supple.  Cardiovascular: Normal rate, regular rhythm and intact distal pulses.   No murmur heard. Pulmonary/Chest: Effort normal and breath sounds normal. No respiratory distress. He has no wheezes. He has no rales.  Abdominal: Soft. He exhibits no distension. There is no tenderness. There is no rebound and no guarding.  Musculoskeletal: Normal range of motion. He exhibits no edema or tenderness.  Neurological: He is alert and oriented to person, place, and time. A sensory deficit is present.  3 out of 5 strength in the right lower extremity, 4 out of 5 strength in the right upper extremity. 2 out of 5 strength in the left upper and lower extremity.  Expressive aphasia. No notable facial droop. Patient follows commands without difficulty  Skin: Skin is warm and dry. No rash noted. No erythema.  Psychiatric: He has a normal mood and affect. His behavior is normal.  Nursing note and vitals reviewed.   ED Course  Procedures (including critical care time) Labs Review Labs Reviewed  DIFFERENTIAL - Abnormal; Notable for the following:    Monocytes Relative 14 (*)    All other components within normal limits  COMPREHENSIVE METABOLIC PANEL - Abnormal; Notable for the following:    Glucose, Bld 103 (*)    Albumin 3.4 (*)    GFR calc non Af Amer 73 (*)    GFR calc Af Amer 85 (*)    All other components within normal limits  PROTIME-INR  APTT  CBC  I-STAT CHEM 8, ED  I-STAT TROPOININ, ED  CBG MONITORING, ED    Imaging Review No results found.   EKG Interpretation   Date/Time:  Friday October 08 2014 14:50:30 EST Ventricular Rate:  55 PR Interval:  172 QRS Duration: 86 QT Interval:  370 QTC Calculation: 353 R Axis:   -21 Text Interpretation:  Sinus bradycardia Left ventricular hypertrophy  Nonspecific ST and T wave  abnormality No significant change since last  tracing Confirmed by Maryan Rued  MD, Loree Fee ([id number]) on 10/08/2014 4:11:37 PM      MDM   Final diagnoses:  Stroke    Pt sent here from MRI after finding a subacute infarct.  Sat pt had an episode of staring and seen in the emergency room and diagnosed with seizure after a workup and evaluation by neurology. Since that time patient has been unable to stand which is new per his wife. Saw the neurologist yesterday had an EEG which was nonspecific and an MRI today that showed a subacute infarct. Spoke with Dr. Aram Beecham who recommended the patient be admitted for stroke workup. Initial  EKG and lab testing is without acute findings. Patient is hemodynamically stable.  Blanchie Dessert, MD 10/08/14 [place], MD 10/08/14 [phone]

## 2014-10-08 NOTE — ED Notes (Signed)
MD at bedside. 

## 2014-10-08 NOTE — Progress Notes (Signed)
Pt arrived to 4N13 via stretcher.  Oriented and settled in.  Telemetry applied.  No c/o pain.  Will continue to monitor. Cori Razor, RN

## 2014-10-09 DIAGNOSIS — I63132 Cerebral infarction due to embolism of left carotid artery: Secondary | ICD-10-CM

## 2014-10-09 LAB — LIPID PANEL
CHOL/HDL RATIO: 2.6 ratio
Cholesterol: 129 mg/dL (ref 0–200)
HDL: 50 mg/dL (ref >39)
LDL Cholesterol: 64 mg/dL (ref 0–99)
Triglycerides: 76 mg/dL (ref <150)
VLDL: 15 mg/dL (ref 0–40)

## 2014-10-09 LAB — PHENYTOIN LEVEL, TOTAL: Phenytoin Lvl: 2.7 ug/mL — ABNORMAL LOW (ref 10.0–20.0)

## 2014-10-09 MED ORDER — CLOPIDOGREL BISULFATE 75 MG PO TABS
75.0000 mg | ORAL_TABLET | Freq: Every day | ORAL | Status: DC
  Administered 2014-10-09 – 2014-10-14 (×6): 75 mg via ORAL
  Filled 2014-10-09 (×6): qty 1

## 2014-10-09 NOTE — Evaluation (Addendum)
Speech Language Pathology Evaluation Patient Details Name: Craig [name] MRN: [id number] DOB: 1951-03-28 Today's Date: 10/09/2014 Time: 2409-7353 SLP Time Calculation (min) (ACUTE ONLY): 35 min  Problem List:  Patient Active Problem List   Diagnosis Date Noted  . Dependent edema 08/17/2014  . History of stroke 03/10/2014  . Memory loss 08/17/2013  . 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 Alterations of sensations, late effect of cerebrovascular disease 01/29/2012  . 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 CVA (cerebral infarction) 12/06/2011  . Hypertension, accelerated 05/15/2011   Past Medical History:  Past Medical History  Diagnosis Date  . Hypertension   . Allergy     RHINITIS  . Diverticulosis   . Colonic polyp   . 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 Seizures   . Dementia    Past Surgical History: History reviewed. No pertinent past surgical history. HPI:  Pt is a 64 y.o. male with PMH HTN, h/o CVAs-residual L sided weakness, Seizures recently seen in ED (6 days ago) for staring spells felt to be related to seizures evaluated at outpatient neurology office yesterday for with EEG, MRI and found to have subacute L parietal lobe CVA; Patient reports chronic L sided weakness, but recently developed R sided leg weakness with inability to ambulate.    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 Pt was able to complete some automatic speech tasks (days of week, counting, name/ wife's name) with max assist- verbal, visual, tactile/ melodic intonation therapy cues. Pt did say several fluent sentences and phrases ("thank you," described how to say name- "some say it ____ but others say it ____"). Pt answered yes/ no questions and followed 1-step commands with 100% accuracy when involving pointing but was unable to  follow commands for oral motor exam, likely due to apraxia. Laughed appropriately at jokes. Pt demonstrates frequent groping for words when asked a question.  Pt appears to have some right side neglect but was able to follow gaze to the right side. Given these findings, pt needs continued speech therapy as pt is currently demonstrating difficulties expressing basic wants and needs. Will continue to follow and will provide communication board. Treatment provided through verbal/ visual/ tactile and melodic intonation therapy cues to increase verbal responses. Pt especially benefited from visual and melodic intonation cues.    SLP Assessment  Patient needs continued Speech Lanaguage Pathology Services    Follow Up Recommendations  Inpatient Rehab    Frequency and Duration min 2x/week  2 weeks   Pertinent Vitals/Pain Pain Assessment: No/denies pain   SLP Goals  Potential to Achieve Goals (ACUTE ONLY): Good Potential Considerations (ACUTE ONLY): Severity of impairments  SLP Evaluation Prior Functioning  Cognitive/Linguistic Baseline: Within functional limits   Cognition  Overall Cognitive Status: Difficult to assess Arousal/Alertness: Awake/alert Orientation Level: Oriented to person;Other (comment) (difficult to assess d/t aphasia) Attention: Sustained;Selective Sustained Attention: Appears intact Selective Attention: Appears intact    Comprehension  Auditory Comprehension Overall Auditory Comprehension: Impaired Yes/No Questions: Within Functional Limits Commands: Impaired One Step Basic Commands: 75-100% accurate Two Step Basic Commands: 50-74% accurate Conversation: Simple Visual Recognition/Discrimination Discrimination: Not tested Reading Comprehension Reading Status: Not tested    Expression Expression Primary Mode of Expression: Other (comment) (Attempts verbal but then uses some pointing/ gestures) Verbal Expression Overall Verbal Expression: Impaired Initiation:  Impaired Automatic Speech: Name;Counting;Day of week (with max  cues) Level of Generative/Spontaneous Verbalization: Phrase Repetition: Impaired Level of Impairment: Word level Naming: Impairment Pragmatics: No impairment Effective Techniques: Melodic intonation;Articulatory cues;Phonemic cues Non-Verbal Means of Communication: Other (comment) (will bring communication board- expect success with it) Written Expression Dominant Hand: Right Written Expression: Exceptions to [facility] Dictation Ability: Word   Oral / Motor Oral Motor/Sensory Function Overall Oral Motor/Sensory Function: Other (comment) (could not assess- difficulties initiating oral motor movemen) Motor Speech Overall Motor Speech: Impaired Motor Planning: Impaired Level of Impairment: Word Motor Speech Errors: Groping for words;Inconsistent   GO     Kern Reap, MA, CCC-SLP 10/09/2014, 2:52 PM

## 2014-10-09 NOTE — Progress Notes (Signed)
STROKE TEAM PROGRESS NOTE   HISTORY Ernesto Zukowski is a 64 y.o. 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 Wife provides most of the history and tells me that last Saturday she brought him to this ED because " she was staring off, was poorly responsive, and his right side stop working to the point that he couldn't ambulate". ED thought that he had a "petit mal" seizure and his Vimpat was increased.  I did review the detailed note from 2/4 by Dr. Jaynee Eagles at [facility]. Had EEG That same day that was reported as " abnormal EEG recording secondary to diffuse background theta slowing. This is a nonspecific recording, and can be seen with any process that results in a toxic or metabolic encephalopathy, or any dementing illness. No evidence of epileptiform discharges were seen". In addition, MRI brain was done and revealed " a small posterior parietal infarct". As per wife, he did not have any seizures since last March until last Saturday. Takes Depakote, Vimpat, and Dilantin. Patient is now unable to walk. Denies HA, vertigo, double vision, slurred speech, or language impairment. Said that he can not see well on the left visual field. On aspirin.   Date last known well: uncertain Time last known well: uncertain tPA Given: no, out of the window   SUBJECTIVE (INTERVAL HISTORY) Patient's wife at the bedside. The patient is well-known to Dr. Leonie Man. He reviewed the images from the MRI performed at an outside facility and had a long talk with the patient's wife who gave most of the history.   OBJECTIVE Temp:  [97.9 F (36.6 C)-99.1 F (37.3 C)] 98.2 F (36.8 C)  (02/06 0700) Pulse Rate:  [53-105] 80 (02/06 0700) Cardiac Rhythm:  [-] Sinus bradycardia (02/05 1900) Resp:  [13-19] 18 (02/06 0700) BP: (135-167)/(68-94) 142/86 mmHg (02/06 0700) SpO2:  [85 %-100 %] 94 % (02/06 0700)   Recent Labs Lab 10/02/14 1724 10/08/14 1656  GLUCAP 93 107*    Recent Labs Lab 10/02/14 1709 10/02/14 1711 10/08/14 1459 10/08/14 1525  NA 144 146* 142 143  K 4.0 4.2 4.0 4.0  CL 104 108 103 103  CO2  --  34* 31  --   GLUCOSE 88 90 103* 97  BUN 16 13 9 12   CREATININE 1.10 1.15 1.06 1.00  CALCIUM  --  8.7 8.9  --     Recent Labs Lab 10/02/14 1711 10/08/14 1459  AST 18 16  ALT 21 17  ALKPHOS 58 58  BILITOT 0.3 0.4  PROT 6.2 6.3  ALBUMIN 3.4* 3.4*    Recent Labs Lab 10/02/14 1709 10/02/14 1711 10/08/14 1459 10/08/14 1525  WBC  --  6.1 6.3  --   NEUTROABS  --  3.5 4.1  --   HGB 16.7 15.0 15.4 17.0  HCT 49.0 46.8 47.5 50.0  MCV  --  90.7 91.9  --   PLT  --  196 201  --    No results for input(s): CKTOTAL, CKMB, CKMBINDEX, TROPONINI in the last 168 hours.  Recent Labs  10/08/14 1459  LABPROT 13.1  INR 0.98   No results for input(s): COLORURINE, LABSPEC, PHURINE, GLUCOSEU, HGBUR, BILIRUBINUR, KETONESUR, PROTEINUR, UROBILINOGEN, NITRITE, LEUKOCYTESUR in the last  72 hours.  Invalid input(s): APPERANCEUR     Component Value Date/Time   CHOL 129 10/09/2014 0545   TRIG 76 10/09/2014 0545   HDL 50 10/09/2014 0545   CHOLHDL 2.6 10/09/2014 0545   VLDL 15 10/09/2014 0545   LDLCALC 64 10/09/2014 0545   Lab Results  Component Value Date   HGBA1C 6.4* 12/07/2011      Component Value Date/Time   LABOPIA NONE DETECTED 10/02/2014 1844   COCAINSCRNUR NONE DETECTED 10/02/2014 1844   LABBENZ NONE DETECTED 10/02/2014 1844   AMPHETMU NONE DETECTED 10/02/2014 1844   THCU NONE DETECTED 10/02/2014 1844   LABBARB NONE DETECTED 10/02/2014 1844     Recent Labs Lab 10/02/14 1711  ETH <5    No results found.   PHYSICAL EXAM Frail   elderly African-American gentleman currently not in distress. . Afebrile. Head is nontraumatic. Neck is supple without bruit.    Cardiac exam no murmur or gallop. Lungs are clear to auscultation. Distal pulses are well felt.  Neurological Exam : Awake alert oriented 2 only. Speech is nonfluent speech few words and short sentences only. Follows commands well. Extraocular movements are full range without nystagmus. His dense left homonymous hemianopsia. Mild left lower facial weakness. Tongue midline. Motor system exam reveals left hemiparesis with 2/5 strength on the left. Right upper extremity strength is normal. Right lower extremity strength is 1/5 with only trace withdrawal. Right plantar is downgoing left is upgoing. Sensation canal and cognition not reliably tested. Gait was not tested.  ASSESSMENT/PLAN Mr. Argil Mahl is a 64 y.o. male with history of hypertension, previous strokes with residual right hemiparesis, left hemianopia, post stroke seizures, and dementia presenting with worsening right hemiparesis and inability to walk.  He did not receive IV t-PA  due to late presentation - out of the therapeutic window for treatment.  Stroke: subacute left parasagittal parietal infarct. Dominant - possibly embolic  Left distal ACA territoryf rom an unknown source.  Resultant  right  LE monoparesis and inability to walk.  MRI - from outside facility - subacute left posterior parietal infarct.  MRA  not performed  Carotid Doppler pending  2D Echo  pending  LDL 64  HgbA1c pending  Lovenox for VTE prophylaxis  Diet Heart with thin liquids  aspirin 81 mg orally every day prior to admission, now on aspirin 325 mg orally every day Change to Plavix  Ongoing aggressive stroke risk factor management  Therapy recommendations: Pending  Disposition: Pending  Hypertension  Home meds: Bystolic 10 mg daily, Avapro 150 mg daily and Norvasc 10 mg daily,  Stable   Hyperlipidemia  Home  meds:  No cholesterol lowering medications prior to admission.  LDL 64, goal < 70   Other Stroke Risk Factors  Advanced age  Hx stroke/TIA  Family hx stroke (brother)   Other Active Problems  Post stroke seizure history - check Dilantin level   Other Pertinent History  Dementia  Plan  TEE and possible loop on Monday.  Change aspirin to Plavix  Therapy evaluations  Hospital day # 1  Mikey Bussing PA-C [facility] Pager [phone] 10/09/2014, 8:01 AM I have personally examined this patient, reviewed notes, independently viewed imaging studies, participated in medical decision making and plan of care. I have made any additions or clarifications directly to the above note. Agree with note above. The patient had transient altered mental status 60s levels which was felt to be possible seizure but since then has had difficulty walking with  more noticeable right leg weakness which I think is from dyspnea left anterior cerebral artery embolic infarct. Source of embolism is not yet determined but strong suspicion for paroxysmal  Atrial fibrillation or cardiac source of embolism. Recommend transesophageal echocardiogram and if negative loop recorder implant. I had a long discussion with the daughter regarding his present stroke, significantly increased disability and likely need for 24-hour supervision and care and prolonged stay and rehabilitation in the future.  Antony Contras, MD Medical Director [place] Pager: [phone] 10/09/2014 3:56 PM     To contact Stroke Continuity provider, please refer to [email or web address] After hours, contact General Neurology

## 2014-10-09 NOTE — Progress Notes (Signed)
Physical Therapy Evaluation Patient Details Name: Craig [name] MRN: [id number] DOB: 06/17/1951 Today's Date: 10/09/2014   History of Present Illness  Patient is a 64 yo male admitted 10/08/14 with Rt sided weakness, inability to walk, and decreased speech.  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  Clinical Impression  Patient presents with problems listed below.  Will benefit from acute PT to maximize independence prior to discharge.  Recommend Inpatient Rehab consult to maximize functional mobility prior to return home with wife.    Follow Up Recommendations CIR;Supervision/Assistance - 24 hour    Equipment Recommendations  None recommended by PT    Recommendations for Other Services Rehab consult     Precautions / Restrictions Precautions Precautions: Fall Restrictions Weight Bearing Restrictions: No      Mobility  Bed Mobility Overal bed mobility: Needs Assistance;+2 for physical assistance Bed Mobility: Rolling;Sidelying to Sit;Sit to Supine Rolling: Max assist;+2 for physical assistance Sidelying to sit: Max assist;+2 for physical assistance   Sit to supine: Total assist;+2 for physical assistance   General bed mobility comments: Verbal and tactile cues for technique.  Requiring assist to bring LE's off of bed and to raise trunk to sitting position.  Required max assist for sitting balance, leaning to right and posteriorly.  Head turned to left.  Worked in sitting on midline posture and tracking objects toward right side.  Did get head to midline, and eyes crossing midline.  Required +2 assist to return to supine.  Transfers                    Ambulation/Gait                Stairs            Wheelchair Mobility    Modified Rankin (Stroke Patients Only) Modified Rankin (Stroke Patients Only) Pre-Morbid Rankin Score: Moderate disability Modified Rankin: Severe disability     Balance Overall balance  assessment: Needs assistance Sitting-balance support: Bilateral upper extremity supported;Feet supported (Hands on bed, but not using UE's to help with balance) Sitting balance-Leahy Scale: Zero Sitting balance - Comments: Patient requires max assist to maintain sitting balance, leaning posteriorly and to right.  Worked on moving to midline with trunk and head. Postural control: Posterior lean;Right lateral lean                                   Pertinent Vitals/Pain Pain Assessment: No/denies pain    Home Living Family/patient expects to be discharged to:: Private residence Living Arrangements: Spouse/significant other Available Help at Discharge: Family;Available 24 hours/day;Personal care attendant (24 hours assist) Wife, son, paid CNA Type of Home: House Home Access: Ramped entrance     Home Layout: One level Home Equipment: Walker - 2 wheels;Cane - single point;Wheelchair - manual;Tub bench      Prior Function Level of Independence: Independent with assistive device(s);Needs assistance   Gait / Transfers Assistance Needed: Patient primarily using w/c.  Occasionally would use cane for short distances in house.  ADL's / Homemaking Assistance Needed: Assist for meal prep, housekeeping, bathing        Hand Dominance   Dominant Hand: Right    Extremity/Trunk Assessment   Upper Extremity Assessment: Defer to OT evaluation           Lower Extremity Assessment: RLE deficits/detail;LLE deficits/detail RLE Deficits / Details: Decreased strength with min to no  movement noted - Grossly 1/5 LLE Deficits / Details: Strength grossly 3+/5 - 4-/5 (From prior CVA)  Cervical / Trunk Assessment:  (Patient with Rt neglect, with head maintained to left)  Communication   Communication: Expressive difficulties  Cognition Arousal/Alertness: Awake/alert Behavior During Therapy: Flat affect Overall Cognitive Status: Difficult to assess                       General Comments      Exercises        Assessment/Plan    PT Assessment Patient needs continued PT services  PT Diagnosis Difficulty walking;Hemiplegia non-dominant side;Hemiplegia dominant side   PT Problem List Decreased strength;Decreased activity tolerance;Decreased balance;Decreased mobility;Decreased knowledge of use of DME;Decreased knowledge of precautions;Impaired tone  PT Treatment Interventions DME instruction;Functional mobility training;Therapeutic activities;Balance training;Neuromuscular re-education;Patient/family education   PT Goals (Current goals can be found in the Care Plan section) Acute Rehab PT Goals Patient Stated Goal: None stated PT Goal Formulation: With patient/family Time For Goal Achievement: 10/23/14 Potential to Achieve Goals: Good    Frequency Min 4X/week   Barriers to discharge        Co-evaluation               End of Session   Activity Tolerance: Patient limited by fatigue Patient left: in bed;with call bell/phone within reach;with bed alarm set;with family/visitor present Nurse Communication: Mobility status;Need for lift equipment         Time: 1340-1404 PT Time Calculation (min) (ACUTE ONLY): 24 min   Charges:   PT Evaluation $Initial PT Evaluation Tier I: 1 Procedure PT Treatments $Therapeutic Activity: 8-22 mins   PT G Codes:        Despina Pole 2014-10-20, 3:25 PM

## 2014-10-09 NOTE — Progress Notes (Signed)
VASCULAR LAB PRELIMINARY  PRELIMINARY  PRELIMINARY  PRELIMINARY  Carotid Dopplers completed.    Preliminary report:  1-39% ICA stenosis.  Vertebral artery flow is antegrade.   Gionni Vaca, RVT 10/09/2014, 6:08 PM

## 2014-10-09 NOTE — Progress Notes (Signed)
[facility] PROGRESS NOTE  Craig [name] WUJ:[id number] DOB: 09-Apr-1951 DOA: 10/08/2014 PCP: Wyatt Haste, MD  Assessment/Plan: 1. Acute cerebrovascular accident -Patient presenting with right-sided hemiparesis, MRI done at outside facility showing subacute left posterior parietal infarct, possibly embolic source. -Neurology following, aspirin changed to Plavix -Patient on stroke protocol, pending transthoracic echocardiogram and carotid Dopplers -May need transesophageal echocardiogram, await further recommendations from neurology. -Physical therapy/occupational therapy/speech pathology consultation  2.  History of seizure activity. -Likely secondary to CVA -Continue Depakote 500 mg 3 times a day, Dilantin 100 mg twice a day and Vimpat 125 mg PO BID  3.  Hypertension -Blood pressures are stable, with systolic blood pressures remaining in the 140s. -Will continue holding Bystolic and Avapro to allow for permissive hypertension favoring cerebral perfusion in setting of CVA   Code Status: Full code Family Communication: Spoke to his wife was present at bedside Disposition Plan:    Consultants:  Neurology  Procedures:  Pending transthoracic echocardiogram  Pending carotid Dopplers   HPI/Subjective: Patient is a pleasant 64 year old with a past medical history of hypertension, history of CVA with residual left-sided weakness and was admitted to the medicine service on 10/08/2014. He presented with right-sided hemiparesis associated with inability to ambulate. He had been seen in the emergency department earlier this week as there was concerns for seizure activity for which she saw Dr Jannifer Franklin of neurology undergoing EEG on 10/07/2014. The study showed diffuse background theta slowing, no evidence of epileptiform discharges seen. He had an MRI done at an outside facility which showed small left posterior parietal infarct. Patient was evaluated by neurology as his aspirin  was changed to Plavix.  Objective: Filed Vitals:   10/09/14 0935  BP: 140/74  Pulse: 60  Temp: 98.6 F (37 C)  Resp: 18    Intake/Output Summary (Last 24 hours) at 10/09/14 1327 Last data filed at 10/09/14 0900  Gross per 24 hour  Intake    600 ml  Output      1 ml  Net    599 ml   There were no vitals filed for this visit.  Exam:   General:  Patient is in no acute distress, somewhat sedated however is arousable, following simple commands  Cardiovascular: Regular rate rhythm normal S1-S2, no extremity edema  Respiratory: Normal respiratory effort, lungs are clear to auscultation bilaterally  Abdomen: Soft nontender nondistended  Musculoskeletal: No extremity edema  Neurological: Patient having 3-5 muscle strength to his left upper and left lower extremity, 4-5 muscle strength to right upper and right lower extremity, he is minimally verbal. Extraocular movement was intact  Data Reviewed: Basic Metabolic Panel:  Recent Labs Lab 10/02/14 1709 10/02/14 1711 10/08/14 1459 10/08/14 1525  NA 144 146* 142 143  K 4.0 4.2 4.0 4.0  CL 104 108 103 103  CO2  --  34* 31  --   GLUCOSE 88 90 103* 97  BUN 16 13 9 12   CREATININE 1.10 1.15 1.06 1.00  CALCIUM  --  8.7 8.9  --    Liver Function Tests:  Recent Labs Lab 10/02/14 1711 10/08/14 1459  AST 18 16  ALT 21 17  ALKPHOS 58 58  BILITOT 0.3 0.4  PROT 6.2 6.3  ALBUMIN 3.4* 3.4*   No results for input(s): LIPASE, AMYLASE in the last 168 hours. No results for input(s): AMMONIA in the last 168 hours. CBC:  Recent Labs Lab 10/02/14 1709 10/02/14 1711 10/08/14 1459 10/08/14 1525  WBC  --  6.1 6.3  --  NEUTROABS  --  3.5 4.1  --   HGB 16.7 15.0 15.4 17.0  HCT 49.0 46.8 47.5 50.0  MCV  --  90.7 91.9  --   PLT  --  196 201  --    Cardiac Enzymes: No results for input(s): CKTOTAL, CKMB, CKMBINDEX, TROPONINI in the last 168 hours. BNP (last 3 results) No results for input(s): BNP in the last 8760  hours.  ProBNP (last 3 results) No results for input(s): PROBNP in the last 8760 hours.  CBG:  Recent Labs Lab 10/02/14 1724 10/08/14 1656  GLUCAP 93 107*    No results found for this or any previous visit (from the past 240 hour(s)).   Studies: No results found.  Scheduled Meds: . clopidogrel  75 mg Oral Daily  . divalproex  500 mg Oral TID  . enoxaparin (LOVENOX) injection  40 mg Subcutaneous Q24H  . lacosamide  125 mg Oral BID  . phenytoin  100 mg Oral BID   Continuous Infusions:   Active Problems:   CVA (cerebral infarction)   Homonymous hemianopia, left    Time spent: 30 min    Craig [name]  [facility] Pager [phone]. If 7PM-7AM, please contact night-coverage at [email or web address], password [email or web address] 10/09/2014, 1:27 PM  LOS: 1 day

## 2014-10-09 NOTE — Evaluation (Signed)
Occupational Therapy Evaluation Patient Details Name: Craig [name] MRN: [id number] DOB: Feb 02, 1951 Today's Date: 10/09/2014    History of Present Illness Patient is a 64 yo male admitted 10/08/14 with Rt sided weakness, inability to walk, and decreased speech.  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   Clinical Impression   PTA pt lived at home with his wife, who assisted with bathing and dressing. Pt was primarily in w/c however was able to use cane to get around at home. Pt currently requires max/total (A) for bathing and dressing due to UE impairments and poor trunk control. Pt required max A +2 for bed mobility. Pt will benefit from acute OT to address functional use of Bil UEs, toilet transfers, and independence with ADLs. Pt also presents with R sided visual neglect. Pt would be a strong candidate for CIR.    Follow Up Recommendations  CIR;Supervision/Assistance - 24 hour    Equipment Recommendations  None recommended by OT    Recommendations for Other Services       Precautions / Restrictions Precautions Precautions: Fall Restrictions Weight Bearing Restrictions: No      Mobility Bed Mobility Overal bed mobility: Needs Assistance;+2 for physical assistance Bed Mobility: Rolling Rolling: Max assist;+2 for physical assistance Sidelying to sit: Max assist;+2 for physical assistance   Sit to supine: Total assist;+2 for physical assistance   General bed mobility comments: Verbal and tactile cues. Pt performed rolling with max (A) to change bed pad and perform pericare.   Transfers                 General transfer comment: Not completed at this time due to safety.          ADL Overall ADL's : Needs assistance/impaired Eating/Feeding: Minimal assistance;Bed level Eating/Feeding Details (indicate cue type and reason): wife reports he fed himself breakfast using RUE Grooming: Minimal assistance;Bed level   Upper Body Bathing:  Total assistance;Bed level   Lower Body Bathing: Total assistance;+2 for physical assistance;Sit to/from stand   Upper Body Dressing : Total assistance;Bed level   Lower Body Dressing: Total assistance;+2 for physical assistance;Bed level                 General ADL Comments: When OT arrived, NT was preparing to change pt's bed pad and perform pericare. Assisted NT in rolling pt. Assessed at bed level. Pt with R side visual neglect, but able to track past midline. Provided with sign on bathroom door (to the right) for pt to look to. Educated pt's wife about sitting to his right to talk to him.      Vision                 Additional Comments: Difficult to assess due to communication. Ocular ROM WFL. Pt with L head turn (Right neglect) however able to turn head just past midline to track and look at OT. Pt denies blurry vision, double vision, or dizziness.           Pertinent Vitals/Pain Pain Assessment: No/denies pain     Hand Dominance Right   Extremity/Trunk Assessment Upper Extremity Assessment Upper Extremity Assessment: LUE deficits/detail;RUE deficits/detail RUE Deficits / Details: shoulder flexion 2+/5, bicep 3/5, tricep 3/5, grip 4/5 RUE Coordination: decreased fine motor;decreased gross motor LUE Deficits / Details: Pt with some purposeful movement of LUE, however presents with spasticity and involuntary "twitching" which wife states has been happening recently LUE Coordination: decreased fine motor;decreased gross motor  Lower Extremity Assessment Lower Extremity Assessment: Defer to PT evaluation    Cervical / Trunk Assessment Cervical / Trunk Assessment: Other exceptions Cervical / Trunk Exceptions: Pt with R inattention   Communication Communication Communication: Expressive difficulties   Cognition Arousal/Alertness: Awake/alert Behavior During Therapy: WFL for tasks assessed/performed Overall Cognitive Status: Difficult to assess                                 Home Living Family/patient expects to be discharged to:: Inpatient rehab Living Arrangements: Spouse/significant other Available Help at Discharge: Family;Available 24 hours/day;Personal care attendant (24 hours assist; wife, son, paid CNA) Type of Home: House Home Access: Ramped entrance     Home Layout: One level               Home Equipment: Walker - 2 wheels;Cane - single point;Wheelchair - manual;Tub bench          Prior Functioning/Environment Level of Independence: Needs assistance  Gait / Transfers Assistance Needed: Patient primarily using w/c.  Occasionally would use cane for short distances in house. ADL's / Homemaking Assistance Needed: Pt's wife assisted with bathing and dressing. Pt had an aide for several hours during the day and then had his son's assistance. Pt was a household ambulator.         OT Diagnosis: Generalized weakness;Cognitive deficits;Acute pain;Hemiplegia dominant side   OT Problem List: Decreased strength;Decreased range of motion;Decreased activity tolerance;Impaired balance (sitting and/or standing);Decreased coordination;Decreased cognition;Decreased safety awareness;Impaired UE functional use   OT Treatment/Interventions: Self-care/ADL training;Therapeutic exercise;Neuromuscular education;Energy conservation;DME and/or AE instruction;Therapeutic activities;Cognitive remediation/compensation;Patient/family education;Balance training    OT Goals(Current goals can be found in the care plan section) Acute Rehab OT Goals Patient Stated Goal: to be me again OT Goal Formulation: With patient Time For Goal Achievement: 10/23/14 Potential to Achieve Goals: Good ADL Goals Pt Will Perform Eating: with set-up;with supervision;sitting (using Bil UEs) Pt Will Perform Grooming: with set-up;with supervision;sitting (using Bil UEs) Pt Will Perform Upper Body Bathing: with set-up;with supervision;sitting Pt Will Perform  Upper Body Dressing: with set-up;with supervision;sitting Pt Will Transfer to Toilet: with min assist;squat pivot transfer;with +2 assist;bedside commode  OT Frequency: Min 3X/week    End of Session  Activity Tolerance: Patient tolerated treatment well Patient left: in bed;with call bell/phone within reach;with bed alarm set;with family/visitor present   Time: 9937-1696 OT Time Calculation (min): 43 min Charges:  OT General Charges $OT Visit: 1 Procedure OT Evaluation $Initial OT Evaluation Tier I: 1 Procedure OT Treatments $Self Care/Home Management : 23-37 mins G-Codes:    Juluis Rainier 18-Oct-2014, 6:34 PM  Secundino Ginger Lynetta Mare, OTR/L Occupational Therapist [phone] (pager)

## 2014-10-10 DIAGNOSIS — I6310 Cerebral infarction due to embolism of unspecified precerebral artery: Secondary | ICD-10-CM

## 2014-10-10 DIAGNOSIS — I6789 Other cerebrovascular disease: Secondary | ICD-10-CM

## 2014-10-10 DIAGNOSIS — I639 Cerebral infarction, unspecified: Secondary | ICD-10-CM | POA: Insufficient documentation

## 2014-10-10 LAB — CBC
HCT: 43.9 % (ref 39.0–52.0)
HEMOGLOBIN: 14.2 g/dL (ref 13.0–17.0)
MCH: 29.4 pg (ref 26.0–34.0)
MCHC: 32.3 g/dL (ref 30.0–36.0)
MCV: 90.9 fL (ref 78.0–100.0)
Platelets: 172 10*3/uL (ref 150–400)
RBC: 4.83 MIL/uL (ref 4.22–5.81)
RDW: 12.8 % (ref 11.5–15.5)
WBC: 4.8 10*3/uL (ref 4.0–10.5)

## 2014-10-10 LAB — BASIC METABOLIC PANEL
Anion gap: 2 — ABNORMAL LOW (ref 5–15)
BUN: 10 mg/dL (ref 6–23)
CALCIUM: 8.4 mg/dL (ref 8.4–10.5)
CO2: 34 mmol/L — ABNORMAL HIGH (ref 19–32)
Chloride: 108 mmol/L (ref 96–112)
Creatinine, Ser: 1.13 mg/dL (ref 0.50–1.35)
GFR calc Af Amer: 79 mL/min — ABNORMAL LOW (ref >90)
GFR, EST NON AFRICAN AMERICAN: 68 mL/min — AB (ref >90)
Glucose, Bld: 94 mg/dL (ref 70–99)
POTASSIUM: 3.8 mmol/L (ref 3.5–5.1)
SODIUM: 144 mmol/L (ref 135–145)

## 2014-10-10 MED ORDER — ACETAMINOPHEN 325 MG PO TABS
650.0000 mg | ORAL_TABLET | Freq: Four times a day (QID) | ORAL | Status: DC | PRN
  Administered 2014-10-10 – 2014-10-14 (×7): 650 mg via ORAL
  Filled 2014-10-10 (×7): qty 2

## 2014-10-10 NOTE — Progress Notes (Signed)
Echocardiogram 2D Echocardiogram has been performed.  Craig [name] 10/10/2014, 9:39 AM

## 2014-10-10 NOTE — Progress Notes (Signed)
STROKE TEAM PROGRESS NOTE   HISTORY Craig [name] is a 64 y.o. 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 Wife provides most of the history and tells me that last Saturday she brought him to this ED because " she was staring off, was poorly responsive, and his right side stop working to the point that he couldn't ambulate". ED thought that he had a "petit mal" seizure and his Vimpat was increased.  I did review the detailed note from 2/4 by Dr. Jaynee Eagles at [facility]. Had EEG That same day that was reported as " abnormal EEG recording secondary to diffuse background theta slowing. This is a nonspecific recording, and can be seen with any process that results in a toxic or metabolic encephalopathy, or any dementing illness. No evidence of epileptiform discharges were seen". In addition, MRI brain was done and revealed " a small posterior parietal infarct". As per wife, he did not have any seizures since last March until last Saturday. Takes Depakote, Vimpat, and Dilantin. Patient is now unable to walk. Denies HA, vertigo, double vision, slurred speech, or language impairment. Said that he can not see well on the left visual field. On aspirin.   Date last known well: uncertain Time last known well: uncertain tPA Given: no, out of the window   SUBJECTIVE (INTERVAL HISTORY) The patient's daughters at the bedside. She is concerned that the patient's face appears to be swollen on the left. Dr. Leonie Man examined the patient and felt we could continue to monitor the swelling. Once again the plan for the TEE and loop was discussed.   OBJECTIVE Temp:  [97.4 F (36.3  C)-99.3 F (37.4 C)] 98.6 F (37 C) (02/07 0541) Pulse Rate:  [55-71] 55 (02/07 0541) Cardiac Rhythm:  [-] Normal sinus rhythm (02/06 2018) Resp:  [18-20] 18 (02/07 0541) BP: (146-164)/(70-93) 149/70 mmHg (02/07 0541) SpO2:  [99 %-100 %] 100 % (02/07 0541)   Recent Labs Lab 10/08/14 1656  GLUCAP 107*    Recent Labs Lab 10/08/14 1459 10/08/14 1525 10/10/14 0642  NA 142 143 144  K 4.0 4.0 3.8  CL 103 103 108  CO2 31  --  34*  GLUCOSE 103* 97 94  BUN 9 12 10   CREATININE 1.06 1.00 1.13  CALCIUM 8.9  --  8.4    Recent Labs Lab 10/08/14 1459  AST 16  ALT 17  ALKPHOS 58  BILITOT 0.4  PROT 6.3  ALBUMIN 3.4*    Recent Labs Lab 10/08/14 1459 10/08/14 1525 10/10/14 0642  WBC 6.3  --  4.8  NEUTROABS 4.1  --   --   HGB 15.4 17.0 14.2  HCT 47.5 50.0 43.9  MCV 91.9  --  90.9  PLT 201  --  172   No results for input(s): CKTOTAL, CKMB, CKMBINDEX, TROPONINI in the last 168 hours.  Recent Labs  10/08/14 1459  LABPROT 13.1  INR 0.98   No results for input(s): COLORURINE, LABSPEC, PHURINE, GLUCOSEU, HGBUR, BILIRUBINUR, KETONESUR, PROTEINUR, UROBILINOGEN, NITRITE, LEUKOCYTESUR in the last 72 hours.  Invalid input(s): APPERANCEUR     Component Value Date/Time   CHOL 129 10/09/2014 0545   TRIG 76 10/09/2014 0545   HDL 50 10/09/2014 0545  CHOLHDL 2.6 10/09/2014 0545   VLDL 15 10/09/2014 0545   LDLCALC 64 10/09/2014 0545   Lab Results  Component Value Date   HGBA1C 6.4* 12/07/2011      Component Value Date/Time   LABOPIA NONE DETECTED 10/02/2014 1844   COCAINSCRNUR NONE DETECTED 10/02/2014 1844   LABBENZ NONE DETECTED 10/02/2014 1844   AMPHETMU NONE DETECTED 10/02/2014 1844   THCU NONE DETECTED 10/02/2014 1844   LABBARB NONE DETECTED 10/02/2014 1844    No results for input(s): ETH in the last 168 hours.  No results found.   2D echocardiogram with contrast 10/10/2014 Study Conclusions - Left ventricle: The cavity size was normal. Wall thickness  was normal. Systolic function was normal. The estimated ejection fraction was in the range of 60% to 65%. Wall motion was normal; there were no regional wall motion abnormalities. Doppler parameters are consistent with abnormal left ventricular relaxation (grade 1 diastolic dysfunction). - Aortic valve: There was no stenosis. - Mitral valve: There was no significant regurgitation. - Right ventricle: The cavity size was normal. Systolic function was normal. - Tricuspid valve: Peak RV-RA gradient 21 mmHg. - Systemic veins: IVC not visualized. Impressions: - Normal LV size and systolic function, EF 85-46%. Normal RV size and systolic function. No significant valvular abnormalities.   PHYSICAL EXAM Frail  elderly African-American gentleman currently not in distress. . Afebrile. Head is nontraumatic. Neck is supple without bruit.    Cardiac exam no murmur or gallop. Lungs are clear to auscultation. Distal pulses are well felt.  Neurological Exam : Awake alert oriented 2 only. Speech is nonfluent speech few words and short sentences only. Follows commands well. Extraocular movements are full range without nystagmus. His dense left homonymous hemianopsia. Mild left lower facial weakness. Tongue midline. Motor system exam reveals left hemiparesis with 2/5 strength on the left. Right upper extremity strength is normal. Right lower extremity strength is 1/5 with only trace withdrawal. Right plantar is downgoing left is upgoing. Sensation canal and cognition not reliably tested. Gait was not tested.    ASSESSMENT/PLAN [name] is a 64 y.o. male with history of hypertension, previous strokes with residual right hemiparesis, left hemianopia, post stroke seizures, and dementia presenting with worsening right hemiparesis and inability to walk.  He did not receive IV t-PA  due to late presentation - out of the therapeutic window for treatment.   Stroke: subacute left parasagittal  parietal infarct. Dominant - possibly embolic  Left distal ACA territoryf rom an unknown source.  Resultant  right  LE monoparesis and inability to walk.  MRI - from outside facility - subacute left posterior parietal infarct.  MRA  not performed  Carotid Doppler - 1-39% ICA stenosis. Vertebral artery flow is antegrade.   2D Echo EF 6065%. No cardiac source of emboli identified.   LDL 64  HgbA1c pending  Lovenox for VTE prophylaxis  Diet Heart with thin liquids  aspirin 81 mg orally every day prior to admission, now on aspirin 325 mg orally every day Changed to Plavix  Ongoing aggressive stroke risk factor management  Therapy recommendations: CIR recommended. ( Screening ordered )  Disposition: Pending  Hypertension  Home meds: Bystolic 10 mg daily, Avapro 150 mg daily and Norvasc 10 mg daily,  Stable   Hyperlipidemia  Home meds:  No cholesterol lowering medications prior to admission.  LDL 64, goal < 70   Other Stroke Risk Factors  Advanced age  Hx stroke/TIA  Family hx stroke (brother)   Other Active Problems  Post stroke seizure history - check Dilantin level   Other Pertinent History  Dementia  Plan  TEE and possible loop on Monday. Cardiology notified.  Change aspirin to Plavix  Therapy evaluations - CIR recommended.  Hospital day # 2  Mikey Bussing PA-C [facility] Pager [phone] 10/10/2014, 10:15 AM   I have personally examined this patient, reviewed notes, independently viewed imaging studies, participated in medical decision making and plan of care. I have made any additions or clarifications directly to the above note. Agree with note above.  Recommend transesophageal echocardiogram and if negative loop recorder implant. I had a long discussion with the daughter regarding his present stroke, significantly increased disability and likely need for 24-hour supervision and care and prolonged stay and rehabilitation in  the future. Await inpatient rehabilitation assessment  Antony Contras, MD Medical Director [place] Pager: [phone] 10/10/2014 10:15 AM     To contact Stroke Continuity provider, please refer to [email or web address] After hours, contact General Neurology

## 2014-10-10 NOTE — Progress Notes (Signed)
[facility] PROGRESS NOTE  Craig [name] YQM:[id number] DOB: 14-Apr-1951 DOA: 10/08/2014 PCP: Wyatt Haste, MD  Interim Summary Patient is a pleasant 64 year old with a past medical history of hypertension, history of CVA with residual left-sided weakness and was admitted to the medicine service on 10/08/2014. He presented with right-sided hemiparesis associated with inability to ambulate. He had been seen in the emergency department earlier this week as there was concerns for seizure activity for which she saw Dr Jannifer Franklin of neurology undergoing EEG on 10/07/2014. The study showed diffuse background theta slowing, no evidence of epileptiform discharges seen. He had an MRI done at an outside facility which showed small left posterior parietal infarct. Patient was evaluated by neurology as his aspirin was changed to Plavix. Neurology suspecting that this represents thromboembolic phenomenon related to paroxysmal atrial fibrillation and recommends further workup with a transesophageal echocardiogram. Cardiology consulted. Plan for TEE and loop recorder placement in the next 24-48 hours.    Assessment/Plan: 1. Acute cerebrovascular accident -Patient presenting with right-sided hemiparesis, MRI done at outside facility showing subacute left posterior parietal infarct, possibly embolic source. -Neurology following, aspirin changed to Plavix -There is suspicion that this may reflect thromboembolism from a cardiac source. Cardiology was consulted for transesophageal echocardiogram and placement of a loop recorder which will likely occur in the next 24-48 hours. Patient may be having paroxysmal atrial fibrillation. -A transthoracic echocardiogram performed on 10/10/2014 showed ejection fraction of 60-65% with grade 1 diastolic dysfunction -Carotid Dopplers showing 1-39% ICA stenosis -Physical therapy recommending inpatient rehabilitation   2.  History of seizure activity. -Likely secondary to  CVA -Continue Depakote 500 mg 3 times a day, Dilantin 100 mg twice a day and Vimpat 125 mg PO BID  3.  Hypertension -Blood pressures in the 140s to 150s -Will add Avapro 150 mg by mouth daily, hold Bystolic for now   Code Status: Full code Family Communication: Spoke to his wife was present at bedside Disposition Plan:    Consultants:  Neurology  Procedures:  Pending transthoracic echocardiogram  Pending carotid Dopplers   HPI/Subjective: Patient seems improved today he is more awake and alert following commands able to speak more clearly  Objective: Filed Vitals:   10/10/14 1056  BP: 151/72  Pulse: 61  Temp: 98.3 F (36.8 C)  Resp: 18    Intake/Output Summary (Last 24 hours) at 10/10/14 1452 Last data filed at 10/09/14 1700  Gross per 24 hour  Intake    300 ml  Output      0 ml  Net    300 ml   Filed Weights   10/09/14 0700  Weight: 80.831 kg (178 lb 3.2 oz)    Exam:   General:  Patient is in no acute distress, he is more awake and alert compared to yesterday's exam  Cardiovascular: Regular rate rhythm normal S1-S2, no extremity edema  Respiratory: Normal respiratory effort, lungs are clear to auscultation bilaterally  Abdomen: Soft nontender nondistended  Musculoskeletal: No extremity edema  Neurological: Patient having 3-5 muscle strength to his left upper and left lower extremity, 4-5 muscle strength to right upper and right lower extremity, he is minimally verbal. Extraocular movement was intact  Data Reviewed: Basic Metabolic Panel:  Recent Labs Lab 10/08/14 1459 10/08/14 1525 10/10/14 0642  NA 142 143 144  K 4.0 4.0 3.8  CL 103 103 108  CO2 31  --  34*  GLUCOSE 103* 97 94  BUN 9 12 10   CREATININE 1.06 1.00 1.13  CALCIUM 8.9  --  8.4   Liver Function Tests:  Recent Labs Lab 10/08/14 1459  AST 16  ALT 17  ALKPHOS 58  BILITOT 0.4  PROT 6.3  ALBUMIN 3.4*   No results for input(s): LIPASE, AMYLASE in the last 168  hours. No results for input(s): AMMONIA in the last 168 hours. CBC:  Recent Labs Lab 10/08/14 1459 10/08/14 1525 10/10/14 0642  WBC 6.3  --  4.8  NEUTROABS 4.1  --   --   HGB 15.4 17.0 14.2  HCT 47.5 50.0 43.9  MCV 91.9  --  90.9  PLT 201  --  172   Cardiac Enzymes: No results for input(s): CKTOTAL, CKMB, CKMBINDEX, TROPONINI in the last 168 hours. BNP (last 3 results) No results for input(s): BNP in the last 8760 hours.  ProBNP (last 3 results) No results for input(s): PROBNP in the last 8760 hours.  CBG:  Recent Labs Lab 10/08/14 1656  GLUCAP 107*    No results found for this or any previous visit (from the past 240 hour(s)).   Studies: No results found.  Scheduled Meds: . clopidogrel  75 mg Oral Daily  . divalproex  500 mg Oral TID  . enoxaparin (LOVENOX) injection  40 mg Subcutaneous Q24H  . lacosamide  125 mg Oral BID  . phenytoin  100 mg Oral BID   Continuous Infusions:   Active Problems:   CVA (cerebral infarction)   Homonymous hemianopia, left    Time spent: 30 min    Craig [name]  [facility] Pager [phone]. If 7PM-7AM, please contact night-coverage at [email or web address], password [facility] 10/10/2014, 2:52 PM  LOS: 2 days

## 2014-10-11 ENCOUNTER — Encounter (HOSPITAL_COMMUNITY): Payer: Self-pay | Admitting: *Deleted

## 2014-10-11 ENCOUNTER — Ambulatory Visit: Payer: Self-pay | Admitting: Adult Health

## 2014-10-11 ENCOUNTER — Encounter (HOSPITAL_COMMUNITY)
Admission: EM | Disposition: A | Discharge status: Rehabilitation Facility | Payer: BC Managed Care – PPO | Source: Home / Self Care | Attending: Internal Medicine

## 2014-10-11 ENCOUNTER — Encounter: Payer: Self-pay | Admitting: Adult Health

## 2014-10-11 ENCOUNTER — Telehealth: Payer: Self-pay | Admitting: Family Medicine

## 2014-10-11 DIAGNOSIS — I639 Cerebral infarction, unspecified: Secondary | ICD-10-CM

## 2014-10-11 DIAGNOSIS — I63039 Cerebral infarction due to thrombosis of unspecified carotid artery: Secondary | ICD-10-CM

## 2014-10-11 DIAGNOSIS — I10 Essential (primary) hypertension: Secondary | ICD-10-CM

## 2014-10-11 HISTORY — PX: TEE WITHOUT CARDIOVERSION: SHX5443

## 2014-10-11 HISTORY — PX: LOOP RECORDER IMPLANT: SHX5477

## 2014-10-11 LAB — HEMOGLOBIN A1C
Hgb A1c MFr Bld: 5.5 % (ref 4.8–5.6)
Hgb A1c MFr Bld: 5.7 % — ABNORMAL HIGH (ref 4.8–5.6)
MEAN PLASMA GLUCOSE: 111 mg/dL
MEAN PLASMA GLUCOSE: 117 mg/dL

## 2014-10-11 LAB — BASIC METABOLIC PANEL
Anion gap: 6 (ref 5–15)
BUN: 11 mg/dL (ref 6–23)
CALCIUM: 8.7 mg/dL (ref 8.4–10.5)
CHLORIDE: 106 mmol/L (ref 96–112)
CO2: 29 mmol/L (ref 19–32)
Creatinine, Ser: 1.09 mg/dL (ref 0.50–1.35)
GFR calc Af Amer: 82 mL/min — ABNORMAL LOW (ref >90)
GFR calc non Af Amer: 71 mL/min — ABNORMAL LOW (ref >90)
Glucose, Bld: 105 mg/dL — ABNORMAL HIGH (ref 70–99)
Potassium: 4.1 mmol/L (ref 3.5–5.1)
Sodium: 141 mmol/L (ref 135–145)

## 2014-10-11 LAB — CBC
HCT: 48.6 % (ref 39.0–52.0)
Hemoglobin: 15.8 g/dL (ref 13.0–17.0)
MCH: 29.5 pg (ref 26.0–34.0)
MCHC: 32.5 g/dL (ref 30.0–36.0)
MCV: 90.7 fL (ref 78.0–100.0)
PLATELETS: 202 10*3/uL (ref 150–400)
RBC: 5.36 MIL/uL (ref 4.22–5.81)
RDW: 12.8 % (ref 11.5–15.5)
WBC: 7.6 10*3/uL (ref 4.0–10.5)

## 2014-10-11 SURGERY — LOOP RECORDER IMPLANT

## 2014-10-11 SURGERY — ECHOCARDIOGRAM, TRANSESOPHAGEAL
Anesthesia: Moderate Sedation

## 2014-10-11 MED ORDER — MIDAZOLAM HCL 5 MG/ML IJ SOLN
INTRAMUSCULAR | Status: AC
  Filled 2014-10-11: qty 2

## 2014-10-11 MED ORDER — LIDOCAINE-EPINEPHRINE 1 %-1:100000 IJ SOLN
INTRAMUSCULAR | Status: AC
  Filled 2014-10-11: qty 1

## 2014-10-11 MED ORDER — FENTANYL CITRATE 0.05 MG/ML IJ SOLN
INTRAMUSCULAR | Status: DC | PRN
  Administered 2014-10-11: 50 ug via INTRAVENOUS

## 2014-10-11 MED ORDER — SODIUM CHLORIDE 0.9 % IR SOLN: 500.0000 mL | Freq: Once | Status: DC

## 2014-10-11 MED ORDER — DIPHENHYDRAMINE HCL 50 MG/ML IJ SOLN
INTRAMUSCULAR | Status: AC
  Filled 2014-10-11: qty 1

## 2014-10-11 MED ORDER — BUTAMBEN-TETRACAINE-BENZOCAINE 2-2-14 % EX AERO
INHALATION_SPRAY | CUTANEOUS | Status: DC | PRN
  Administered 2014-10-11: 2 via TOPICAL

## 2014-10-11 MED ORDER — MIDAZOLAM HCL 10 MG/2ML IJ SOLN
INTRAMUSCULAR | Status: DC | PRN
  Administered 2014-10-11: 2 mg via INTRAVENOUS

## 2014-10-11 MED ORDER — FENTANYL CITRATE 0.05 MG/ML IJ SOLN
INTRAMUSCULAR | Status: AC
  Filled 2014-10-11: qty 4

## 2014-10-11 MED ORDER — SODIUM CHLORIDE 0.9 % IV SOLN
INTRAVENOUS | Status: DC
  Administered 2014-10-11: 500 mL via INTRAVENOUS

## 2014-10-11 NOTE — Progress Notes (Signed)
OT Cancellation Note  Patient Details Name: Craig [name] MRN: [id number] DOB: 26-Oct-1950   Cancelled Treatment:    Reason Eval/Treat Not Completed: Patient at procedure or test/ unavailable - Pt going for procedure.  Will reattempt.   Darlina Rumpf [place], OTR/L [phone]  10/11/2014, 1:31 PM

## 2014-10-11 NOTE — Progress Notes (Signed)
Pt arrived from procedure alert with no noted distress. Dressing noted to left upper chest dry and intact. Pt denies pain to site but complaint of pain to his back. Repositioned and administered Tylenol per prn order. Will continue to monitor.

## 2014-10-11 NOTE — Telephone Encounter (Signed)
Pt's wife called and wanted to let you know that Machi had another stroke and is in the hospital.

## 2014-10-11 NOTE — Consult Note (Signed)
Physical Medicine and Rehabilitation Consult Reason for Consult: Subacute left parasagittal parietal infarct Referring Physician: [facility]   HPI: Craig [name] is a 64 y.o. right handed male with history of hypertension, vascular dementia, seizure disorder maintained on Dilantin, Depakote and Vimpat, CVA April 2013 a left-sided weakness and decreased peripheral vision and received inpatient rehabilitation services 12/10/2011 until 01/04/2012 discharge to home ambulating with a rolling walker minimal assistance maintained on aspirin for CVA prophylaxis. He lives with his wife used a cane and walker prior to admission. Patient recently presented to the ED 10/02/2014 with staring episodes and poorly responsive. An EEG was completed showing diffuse background theta slowing. No evidence of epileptiform discharges seen. Cranial CT scan showed no acute intracranial abnormalities. He was seen by neurology in the ED and his Vimpat was increased and discharge to home. Presented 10/08/2014 with increasing right-sided weakness and inability to walk. Recent MRI from outside facility showed subacute left posterior parietal infarct. Carotid Dopplers and no ICA stenosis. Echocardiogram with ejection fraction 65% without emboli. Patient did not receive TPA. Neurology consulted aspirin changed to Plavix for CVA prophylaxis. Subcutaneous Lovenox for DVT prophylaxis. Patient is tolerating a regular consistency diet. TEE is pending. Occupational therapy evaluation completed 10/09/2014 with recommendations of physical medicine rehabilitation consult.   Review of Systems  Eyes: Positive for blurred vision.  Neurological: Positive for seizures and weakness.  Psychiatric/Behavioral: Positive for memory loss.  All other systems reviewed and are negative.  Past Medical History  Diagnosis Date  . Hypertension   . Allergy     RHINITIS  . Diverticulosis   . Colonic polyp   . Stroke 12/06/2011    left sided  weakness,dec.peripheral vision  . Seizures   . Dementia    History reviewed. No pertinent past surgical history. Family History  Problem Relation Age of Onset  . Cancer Mother   . Arthritis Mother   . Heart disease Mother   . Prostate cancer Father   . Cancer Sister   . Colon cancer Neg Hx   . Rectal cancer Neg Hx   . Stomach cancer Neg Hx   . Stroke Brother     massive stroke    Social History:  reports that he has never smoked. He has never used smokeless tobacco. He reports that he does not drink alcohol or use illicit drugs. Allergies: No Known Allergies Medications Prior to Admission  Medication Sig Dispense Refill  . acetaminophen (TYLENOL) 325 MG tablet Take 325 mg by mouth every 6 (six) hours as needed for moderate pain.    Marland Kitchen amLODipine (NORVASC) 10 MG tablet TAKE 1 TABLET BY MOUTH DAILY WITH BREAKFAST 30 tablet 11  . aspirin EC 81 MG tablet Take 81 mg by mouth daily with lunch.     . BYSTOLIC 10 MG tablet TAKE 1 TABLET BY MOUTH DAILY 30 tablet 11  . divalproex (DEPAKOTE SPRINKLE) 125 MG capsule Take 500 mg by mouth 3 (three) times daily.     . irbesartan (AVAPRO) 150 MG tablet TAKE 1 TABLET BY MOUTH DAILY (Patient taking differently: TAKE 1 TABLET BY MOUTH EVERY MORNING) 30 tablet 11  . Lacosamide (VIMPAT) 10 MG/ML SOLN Take 12.5 mLs (125 mg total) by mouth 2 (two) times daily. 600 mL 5  . phenytoin (DILANTIN) 50 MG tablet Chew 100 mg by mouth 2 (two) times daily. Chew tablet      Home: Home Living Family/patient expects to be discharged to:: Inpatient rehab Living Arrangements: Spouse/significant other Available  Help at Discharge: Family, Available 24 hours/day, Personal care attendant (24 hours assist; wife, son, paid CNA) Type of Home: House Home Access: Ramped entrance Home Layout: One level Home Equipment: Environmental consultant - 2 wheels, Cane - single point, Wheelchair - manual, Tub bench  Functional History: Prior Function Level of Independence: Needs assistance Gait /  Transfers Assistance Needed: Patient primarily using w/c.  Occasionally would use cane for short distances in house. ADL's / Homemaking Assistance Needed: Pt's wife assisted with bathing and dressing. Pt had an aide for several hours during the day and then had his son's assistance. Pt was a household ambulator.  Functional Status:  Mobility: Bed Mobility Overal bed mobility: Needs Assistance, +2 for physical assistance Bed Mobility: Rolling Rolling: Max assist, +2 for physical assistance Sidelying to sit: Max assist, +2 for physical assistance Sit to supine: Total assist, +2 for physical assistance General bed mobility comments: Verbal and tactile cues. Pt performed rolling with max (A) to change bed pad and perform pericare.  Transfers General transfer comment: Not completed at this time due to safety.       ADL: ADL Overall ADL's : Needs assistance/impaired Eating/Feeding: Minimal assistance, Bed level Eating/Feeding Details (indicate cue type and reason): wife reports he fed himself breakfast using RUE Grooming: Minimal assistance, Bed level Upper Body Bathing: Total assistance, Bed level Lower Body Bathing: Total assistance, +2 for physical assistance, Sit to/from stand Upper Body Dressing : Total assistance, Bed level Lower Body Dressing: Total assistance, +2 for physical assistance, Bed level General ADL Comments: When OT arrived, NT was preparing to change pt's bed pad and perform pericare. Assisted NT in rolling pt. Assessed at bed level. Pt with R side visual neglect, but able to track past midline. Provided with sign on bathroom door (to the right) for pt to look to. Educated pt's wife about sitting to his right to talk to him.   Cognition: Cognition Overall Cognitive Status: Difficult to assess Arousal/Alertness: Awake/alert Orientation Level: Oriented to person, Disoriented to time, Disoriented to place, Disoriented to situation Attention: Sustained, Selective Sustained  Attention: Appears intact Selective Attention: Appears intact Cognition Arousal/Alertness: Awake/alert Behavior During Therapy: WFL for tasks assessed/performed Overall Cognitive Status: Difficult to assess Difficult to assess due to: Impaired communication  Blood pressure 177/105, pulse 95, temperature 98.7 F (37.1 C), temperature source Oral, resp. rate 18, height 5\' 10"  (1.778 m), weight 80.831 kg (178 lb 3.2 oz), SpO2 98 %. Physical Exam  HENT:  Head: Normocephalic.  Eyes:  Pupils reactive to light without nystagmus  Neck: Normal range of motion. Neck supple. No thyromegaly present.  Cardiovascular: Normal rate and regular rhythm.   Respiratory: Effort normal and breath sounds normal. No respiratory distress.  GI: Soft. Bowel sounds are normal. He exhibits no distension.  Neurological:  Patient is alert with flat affect. He does make eye contact with examiner. His responses were very delayed but he was able to provide his name, age and date of birth. Limited awareness of deficits. He did follow some simple commands. Right hemiparesis but inconsistent due to poor attention and language. Right gaze preference, tends to fall to the right during sitting.   Skin: Skin is warm and dry.  Psychiatric:  Flat and disengaged    Results for orders placed or performed during the hospital encounter of 10/08/14 (from the past 24 hour(s))  Basic metabolic panel     Status: Abnormal   Collection Time: 10/11/14  7:20 AM  Result Value Ref Range   Sodium  141 135 - 145 mmol/L   Potassium 4.1 3.5 - 5.1 mmol/L   Chloride 106 96 - 112 mmol/L   CO2 29 19 - 32 mmol/L   Glucose, Bld 105 (H) 70 - 99 mg/dL   BUN 11 6 - 23 mg/dL   Creatinine, Ser 1.09 0.50 - 1.35 mg/dL   Calcium 8.7 8.4 - 10.5 mg/dL   GFR calc non Af Amer 71 (L) >90 mL/min   GFR calc Af Amer 82 (L) >90 mL/min   Anion gap 6 5 - 15  CBC     Status: None   Collection Time: 10/11/14  7:20 AM  Result Value Ref Range   WBC 7.6 4.0 -  10.5 K/uL   RBC 5.36 4.22 - 5.81 MIL/uL   Hemoglobin 15.8 13.0 - 17.0 g/dL   HCT 48.6 39.0 - 52.0 %   MCV 90.7 78.0 - 100.0 fL   MCH 29.5 26.0 - 34.0 pg   MCHC 32.5 30.0 - 36.0 g/dL   RDW 12.8 11.5 - 15.5 %   Platelets 202 150 - 400 K/uL   No results found.  Assessment/Plan: Diagnosis: prior CVA, new left parietal infarct 1. Does the need for close, 24 hr/day medical supervision in concert with the patient's rehab needs make it unreasonable for this patient to be served in a less intensive setting? Yes 2. Co-Morbidities requiring supervision/potential complications: htn, seizure disorder 3. Due to bladder management, bowel management, safety, skin/wound care, disease management, medication administration, pain management and patient education, does the patient require 24 hr/day rehab nursing? Yes 4. Does the patient require coordinated care of a physician, rehab nurse, PT (1-2 hrs/day, 5 days/week), OT (1-2 hrs/day, 5 days/week) and SLP (1-2 hrs/day, 5 days/week) to address physical and functional deficits in the context of the above medical diagnosis(es)? Yes Addressing deficits in the following areas: balance, endurance, locomotion, strength, transferring, bowel/bladder control, bathing, dressing, feeding, grooming, toileting, cognition, speech, language, swallowing and psychosocial support 5. Can the patient actively participate in an intensive therapy program of at least 3 hrs of therapy per day at least 5 days per week? Yes 6. The potential for patient to make measurable gains while on inpatient rehab is good 7. Anticipated functional outcomes upon discharge from inpatient rehab are min assist and mod assist  with PT, min assist and mod assist with OT, min assist with SLP. 8. Estimated rehab length of stay to reach the above functional goals is: 20-27 days 9. Does the patient have adequate social supports and living environment to accommodate these discharge functional goals?  Yes 10. Anticipated D/C setting: Home 11. Anticipated post D/C treatments: HH therapy and Outpatient therapy 12. Overall Rehab/Functional Prognosis: excellent  RECOMMENDATIONS: This patient's condition is appropriate for continued rehabilitative care in the following setting: CIR Patient has agreed to participate in recommended program. Potentially Note that insurance prior authorization may be required for reimbursement for recommended care.  Comment: Rehab Admissions Coordinator to follow up. Good family support  Thanks,  Meredith Staggers, MD, [facility]rown     10/11/2014

## 2014-10-11 NOTE — Progress Notes (Addendum)
Craig NOTE  Craig [name] OMV:[id number] DOB: 08/12/1951 DOA: 10/08/2014 PCP: Wyatt Haste, Craig  Interim Summary Patient is a pleasant 64 year old with a past medical Craig of [name], Craig [name] had been seen in the emergency department earlier this week as there was concerns for seizure activity for which she saw Dr Jannifer Franklin of neurology undergoing EEG on 10/07/2014. The study showed diffuse background theta slowing, no evidence of epileptiform discharges seen. [name] had an MRI done at an outside facility which showed small left posterior parietal infarct. Patient was evaluated by neurology as his aspirin was changed to Plavix. Neurology suspecting that this represents thromboembolic phenomenon related to paroxysmal atrial fibrillation and recommends further workup with a transesophageal echocardiogram. Cardiology consulted. Plan for TEE and loop recorder placement in the next 24-48 hours.  Assessment/Plan: 1. Acute cerebrovascular accident -Patient presenting with right-sided hemiparesis, MRI done at outside facility showing subacute left posterior parietal infarct, possibly embolic source. -Neurology following, aspirin changed to Plavix -There is suspicion that this may reflect thromboembolism from a cardiac source. Cardiology was consulted for transesophageal echocardiogram and placement of a loop recorder (if TEE is negative) -Patient may be having paroxysmal atrial fibrillation. -A transthoracic echocardiogram performed on 10/10/2014 showed ejection fraction of 60-65% with grade 1 diastolic dysfunction -Carotid Dopplers showing 1-39% ICA stenosis -Physical therapy recommending inpatient rehabilitation-->CIR consulted  2. Craig of seizure activity. -Likely secondary to CVA -Continue Depakote  500 mg 3 times a day, Dilantin 100 mg twice a day and Vimpat 125 mg PO BID -dose adjustments per neurology  3. [name] -Blood pressures in the 140s to 628Z -restart bystolic   Code Status: Full code Family Communication: Spoke to his wife on phone 2/8 Disposition Plan: CIR        Procedures/Studies: Ct Head Wo Contrast  10/02/2014   CLINICAL DATA:  64 year old male with right-sided weakness. Craig of prior cerebral vascular accident with left-sided deficits.  EXAM: CT HEAD WITHOUT CONTRAST  TECHNIQUE: Contiguous axial images were obtained from the base of the skull through the vertex without intravenous contrast.  COMPARISON:  Head CT 11/09/2013.  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 Mild cerebral atrophy. Patchy and confluent areas of decreased attenuation are noted throughout the deep and periventricular white matter of the cerebral hemispheres bilaterally, compatible with chronic microvascular ischemic disease. No acute intracranial abnormalities. Specifically, no evidence of acute intracranial hemorrhage, no definite findings of acute/subacute cerebral ischemia, no mass, mass effect, hydrocephalus or abnormal intra or extra-axial fluid collections. No acute displaced skull fractures are identified. Extensive mucosal thickening associated with the visualized terminates. Left maxillary sinus is nearly completely opacified.  IMPRESSION: 1. No acute intracranial abnormalities. 2. 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 3. Mild cerebral atrophy with extensive chronic microvascular ischemic changes in cerebral white matter redemonstrated. 4. Paranasal sinus disease, as above.   Electronically Signed    By: Vinnie Langton M.D.   On: 10/02/2014 17:27         Subjective: Patient denies fevers, chills, headache, chest pain, dyspnea, nausea, vomiting, diarrhea, abdominal pain, dysuria, hematuria   Objective:  Filed Vitals:   10/10/14 2116 10/11/14 0257 10/11/14 0543 10/11/14 1043  BP: 152/78 180/91 177/105 172/96  Pulse: 75 78 95 97  Temp: 98.5 F (36.9 C) 98.4 F (36.9 C) 98.7 F (37.1 C) 97.1 F (36.2 C)  TempSrc: Oral Oral Oral Oral  Resp: 18 18 18 18   Height:      Weight:      SpO2: 100% 100% 98% 99%   No intake or output data in the 24 hours ending 10/11/14 1052 Weight change:  Exam:   General:  Pt is alert, follows commands appropriately, not in acute distress  HEENT: No icterus, No thrush,  [place]/AT  Cardiovascular: RRR, S1/S2, no rubs, no gallops  Respiratory: Poor inspiratory effort but clear to auscultation.  Abdomen: Soft/+BS, non tender, non distended, no guarding  Extremities: No edema, No lymphangitis, No petechiae, No rashes, no synovitis  Data Reviewed: Basic Metabolic Panel:  Recent Labs Lab 10/08/14 1459 10/08/14 1525 10/10/14 0642 10/11/14 0720  NA 142 143 144 141  K 4.0 4.0 3.8 4.1  CL 103 103 108 106  CO2 31  --  34* 29  GLUCOSE 103* 97 94 105*  BUN 9 12 10 11   CREATININE 1.06 1.00 1.13 1.09  CALCIUM 8.9  --  8.4 8.7   Liver Function Tests:  Recent Labs Lab 10/08/14 1459  AST 16  ALT 17  ALKPHOS 58  BILITOT 0.4  PROT 6.3  ALBUMIN 3.4*   No results for input(s): LIPASE, AMYLASE in the last 168 hours. No results for input(s): AMMONIA in the last 168 hours. CBC:  Recent Labs Lab 10/08/14 1459 10/08/14 1525 10/10/14 0642 10/11/14 0720  WBC 6.3  --  4.8 7.6  NEUTROABS 4.1  --   --   --   HGB 15.4 17.0 14.2 15.8  HCT 47.5 50.0 43.9 48.6  MCV 91.9  --  90.9 90.7  PLT 201  --  172 202   Cardiac Enzymes: No results for input(s): CKTOTAL, CKMB, CKMBINDEX, TROPONINI in the last 168 hours. BNP: Invalid input(s):  POCBNP CBG:  Recent Labs Lab 10/08/14 1656  GLUCAP 107*    No results found for this or any previous visit (from the past 240 hour(s)).   Scheduled Meds: . clopidogrel  75 mg Oral Daily  . divalproex  500 mg Oral TID  . enoxaparin (LOVENOX) injection  40 mg Subcutaneous Q24H  . lacosamide  125 mg Oral BID  . phenytoin  100 mg Oral BID   Continuous Infusions: . sodium chloride       Ledon Weihe, DO  [facility] Pager [phone]  If 7PM-7AM, please contact night-coverage [email or web address] Password [email or web address] 10/11/2014, 10:52 AM   LOS: 3 days

## 2014-10-11 NOTE — CV Procedure (Signed)
SURGEON:  Thompson Grayer, MD     PREPROCEDURE DIAGNOSIS:  Cryptogenic Stroke    POSTPROCEDURE DIAGNOSIS:  Cryptogenic Stroke     PROCEDURES:   1. Implantable loop recorder implantation    INTRODUCTION:  Craig [name] is a 64 y.o. male with a history of unexplained stroke who presents today for implantable loop implantation.  The patient has had a cryptogenic stroke.  Despite an extensive workup by neurology, no reversible causes have been identified.  he has worn telemetry during which he did not have arrhythmias.  There is significant concern for possible atrial fibrillation as the cause for the patients stroke.  The patient therefore presents today for implantable loop implantation.     DESCRIPTION OF PROCEDURE:  Informed written consent was obtained, and the patient was brought to the electrophysiology lab in a fasting state.  The patient required no sedation for the procedure today.  Mapping over the patient's chest was performed by the EP lab staff to identify the area where electrograms were most prominent for ILR recording.  This area was found to be the left parasternal region over the 3rd-4th intercostal space. The patients left chest was therefore prepped and draped in the usual sterile fashion by the EP lab staff. The skin overlying the left parasternal region was infiltrated with lidocaine for local analgesia.  A 0.5-cm incision was made over the left parasternal region over the 3rd intercostal space.  A subcutaneous ILR pocket was fashioned using a combination of sharp and blunt dissection.  A Medtronic Reveal [place] model [id number] implantable loop recorder was then placed into the pocket  R waves were very prominent and measured 0.75mV. EBL<1 ml.  Steri- Strips and a sterile dressing were then applied.  There were no early apparent complications.     CONCLUSIONS:   1. Successful implantation of a Medtronic Reveal LINQ implantable loop recorder for cryptogenic stroke  2. No early  apparent complications.

## 2014-10-11 NOTE — Progress Notes (Signed)
  Echocardiogram Echocardiogram Transesophageal has been performed.  Darlina Sicilian M 10/11/2014, 3:15 PM

## 2014-10-11 NOTE — Progress Notes (Signed)
Rehab Admissions Coordinator Note:  Patient was screened by Retta Diones for appropriateness for an Inpatient Acute Rehab Consult.  At this time, we are recommending Inpatient Rehab consult.  Retta Diones 10/11/2014, 9:33 AM  I can be reached at [phone].

## 2014-10-11 NOTE — Consult Note (Signed)
Reason for Consult:  Loop Recorder implant Referring Physician:    Liahm Grivas is an 64 y.o. male.  HPI:   The patient is a 64 yo male with a history of HTN, CVA's with left sided weakness, seizures, dementia.  He presented with a subacute left parietal lobe CVA. Echo revealed an EF of 60-65%, G1DD, no significant valvular abnormalities.  We are asked to consult regarding possible arrhythmic cause of his stroke.  He denies N, V, CP, SOB.  Past Medical History  Diagnosis Date  . Hypertension   . Allergy     RHINITIS  . Diverticulosis   . Colonic polyp   . 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 Seizures   . Dementia     History reviewed. No pertinent past surgical history.  Family History  Problem Relation Age of Onset  . Cancer Mother   . Arthritis Mother   . Heart disease Mother   . Prostate cancer Father   . Cancer Sister   . Colon cancer Neg Hx   . Rectal cancer Neg Hx   . Stomach cancer Neg Hx   . Stroke Brother     massive stroke     Social History:  reports that he has never smoked. He has never used smokeless tobacco. He reports that he does not drink alcohol or use illicit drugs.  Allergies: No Known Allergies  Medications:  Scheduled Meds: . clopidogrel  75 mg Oral Daily  . divalproex  500 mg Oral TID  . enoxaparin (LOVENOX) injection  40 mg Subcutaneous Q24H  . lacosamide  125 mg Oral BID  . phenytoin  100 mg Oral BID   Continuous Infusions: . sodium chloride     PRN Meds:.acetaminophen, hydrALAZINE, senna-docusate   Results for orders placed or performed during the hospital encounter of 10/08/14 (from the past 48 hour(s))  Phenytoin level, total     Status: Abnormal   Collection Time: 10/09/14 12:11 PM  Result Value Ref Range   Phenytoin Lvl 2.7 (L) 10.0 - 20.0 ug/mL  Basic metabolic panel     Status: Abnormal   Collection Time: 10/10/14  6:42 AM  Result Value Ref Range   Sodium 144 135 - 145 mmol/L   Potassium  3.8 3.5 - 5.1 mmol/L   Chloride 108 96 - 112 mmol/L   CO2 34 (H) 19 - 32 mmol/L   Glucose, Bld 94 70 - 99 mg/dL   BUN 10 6 - 23 mg/dL   Creatinine, Ser 1.13 0.50 - 1.35 mg/dL   Calcium 8.4 8.4 - 10.5 mg/dL   GFR calc non Af Amer 68 (L) >90 mL/min   GFR calc Af Amer 79 (L) >90 mL/min    Comment: (NOTE) The eGFR has been calculated using the CKD EPI equation. This calculation has not been validated in all clinical situations. eGFR's persistently <90 mL/min signify possible Chronic Kidney Disease.    Anion gap 2 (L) 5 - 15    Comment: REPEATED TO VERIFY  CBC     Status: None   Collection Time: 10/10/14  6:42 AM  Result Value Ref Range   WBC 4.8 4.0 - 10.5 K/uL   RBC 4.83 4.22 - 5.81 MIL/uL   Hemoglobin 14.2 13.0 - 17.0 g/dL   HCT 43.9 39.0 - 52.0 %   MCV 90.9 78.0 - 100.0 fL   MCH 29.4 26.0 - 34.0 pg   MCHC 32.3 30.0 - 36.0 g/dL  RDW 12.8 11.5 - 15.5 %   Platelets 172 150 - 400 K/uL  Basic metabolic panel     Status: Abnormal   Collection Time: 10/11/14  7:20 AM  Result Value Ref Range   Sodium 141 135 - 145 mmol/L   Potassium 4.1 3.5 - 5.1 mmol/L   Chloride 106 96 - 112 mmol/L   CO2 29 19 - 32 mmol/L   Glucose, Bld 105 (H) 70 - 99 mg/dL   BUN 11 6 - 23 mg/dL   Creatinine, Ser 1.09 0.50 - 1.35 mg/dL   Calcium 8.7 8.4 - 10.5 mg/dL   GFR calc non Af Amer 71 (L) >90 mL/min   GFR calc Af Amer 82 (L) >90 mL/min    Comment: (NOTE) The eGFR has been calculated using the CKD EPI equation. This calculation has not been validated in all clinical situations. eGFR's persistently <90 mL/min signify possible Chronic Kidney Disease.    Anion gap 6 5 - 15  CBC     Status: None   Collection Time: 10/11/14  7:20 AM  Result Value Ref Range   WBC 7.6 4.0 - 10.5 K/uL   RBC 5.36 4.22 - 5.81 MIL/uL   Hemoglobin 15.8 13.0 - 17.0 g/dL   HCT 48.6 39.0 - 52.0 %   MCV 90.7 78.0 - 100.0 fL   MCH 29.5 26.0 - 34.0 pg   MCHC 32.5 30.0 - 36.0 g/dL   RDW 12.8 11.5 - 15.5 %   Platelets 202 150  - 400 K/uL    No results found.  Review of Systems  Constitutional: Negative for fever.  HENT: Negative for congestion.   Respiratory: Negative for cough and shortness of breath.   Cardiovascular: Negative for chest pain and leg swelling.  Gastrointestinal: Negative for nausea, vomiting and abdominal pain.  Neurological: Positive for focal weakness and weakness.  All other systems reviewed and are negative.  Blood pressure 172/96, pulse 97, temperature 97.1 F (36.2 C), temperature source Oral, resp. rate 18, height $RemoveBe'5\' 10"'VbjsFhaAu$  (1.778 m), weight 178 lb 3.2 oz (80.831 kg), SpO2 99 %. Physical Exam  Nursing note and vitals reviewed. Constitutional: He appears well-developed and well-nourished.  Resting comfotably  HENT:  Head: Normocephalic and atraumatic.  Eyes: EOM are normal. Pupils are equal, round, and reactive to light.  Cardiovascular: Normal rate, regular rhythm, S1 normal and S2 normal.   No murmur heard. Pulses:      Radial pulses are 2+ on the right side, and 2+ on the left side.       Posterior tibial pulses are 2+ on the right side, and 2+ on the left side.  Respiratory: Effort normal and breath sounds normal. He has no wheezes. He has no rales.  GI: Soft. Bowel sounds are normal. He exhibits no distension. There is no tenderness.  Musculoskeletal: He exhibits no edema.  Neurological: He is alert. He exhibits abnormal muscle tone.  Skin: Skin is warm and dry.  Psychiatric: He has a normal mood and affect.    Assessment/Plan: Active Problems:   CVA (cerebral infarction)   Homonymous hemianopia, left   Complex partial epileptic seizure   Homonymous hemianopia   Stroke   Essential hypertension  Plan:   64 yo male with a history of HTN, CVA's with left sided weakness, seizures, dementia.  He presented with a subacute left parietal lobe CVA.  EP asked to consult for LINQ cardiac monitor implant.  The risks were explained to Mrs. Carlota Raspberry. She agrees to proceed  with device  implant in her husband.  Dr. Lovena Le to see.   Tarri Fuller, [place] 10/11/2014, 11:09 AM   I have seen, examined the patient, and reviewed the above assessment and plan.  Changes to above are made where necessary.   The patient presents with cryptogenic stroke.  TEE is reviewed and normal  I spoke at length with the the patients wife about monitoring for afib with either a 30 day event monitor or an implantable loop recorder.  Risks, benefits, and alteratives to implantable loop recorder were discussed with the patients spouse today.   At this time, she is very clear in their decision to proceed with implantable loop recorder.   Please call with questions.   Co Sign: Thompson Grayer, MD 10/11/2014 3:25 PM

## 2014-10-11 NOTE — Progress Notes (Signed)
Pt out of the room when I arrived today. Will follow up with him tomorrow morning  Meredith Staggers, MD, [facility] 10/11/2014

## 2014-10-11 NOTE — Progress Notes (Signed)
STROKE TEAM PROGRESS NOTE   HISTORY Craig [name] is a 64 y.o. 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 Wife provides most of the history and tells me that last Saturday she brought him to this ED because " she was staring off, was poorly responsive, and his right side stop working to the point that he couldn't ambulate". ED thought that he had a "petit mal" seizure and his Vimpat was increased.  I did review the detailed note from 2/4 by Dr. Jaynee Eagles at [facility]. Had EEG That same day that was reported as " abnormal EEG recording secondary to diffuse background theta slowing. This is a nonspecific recording, and can be seen with any process that results in a toxic or metabolic encephalopathy, or any dementing illness. No evidence of epileptiform discharges were seen". In addition, MRI brain was done and revealed " a small posterior parietal infarct". As per wife, he did not have any seizures since last March until last Saturday. Takes Depakote, Vimpat, and Dilantin. Patient is now unable to walk. Denies HA, vertigo, double vision, slurred speech, or language impairment. Said that he can not see well on the left visual field. On aspirin.   Date last known well: uncertain Time last known well: uncertain tPA Given: no, out of the window   SUBJECTIVE (INTERVAL HISTORY) The patient's daughters i at the bedside.   plan  Is for the TEE and loop  today   OBJECTIVE Temp:  [97.1 F (36.2 C)-98.7 F (37.1 C)] 97.9 F (36.6 C) (02/08 1354) Pulse Rate:  [64-128] 82 (02/08 1455) Cardiac Rhythm:  [-] Normal sinus rhythm (02/07 2000) Resp:  [14-20] 18 (02/08  1455) BP: (152-203)/(76-123) 164/76 mmHg (02/08 1455) SpO2:  [95 %-100 %] 98 % (02/08 1455)   Recent Labs Lab 10/08/14 1656  GLUCAP 107*    Recent Labs Lab 10/08/14 1459 10/08/14 1525 10/10/14 0642 10/11/14 0720  NA 142 143 144 141  K 4.0 4.0 3.8 4.1  CL 103 103 108 106  CO2 31  --  34* 29  GLUCOSE 103* 97 94 105*  BUN 9 12 10 11   CREATININE 1.06 1.00 1.13 1.09  CALCIUM 8.9  --  8.4 8.7    Recent Labs Lab 10/08/14 1459  AST 16  ALT 17  ALKPHOS 58  BILITOT 0.4  PROT 6.3  ALBUMIN 3.4*    Recent Labs Lab 10/08/14 1459 10/08/14 1525 10/10/14 0642 10/11/14 0720  WBC 6.3  --  4.8 7.6  NEUTROABS 4.1  --   --   --   HGB 15.4 17.0 14.2 15.8  HCT 47.5 50.0 43.9 48.6  MCV 91.9  --  90.9 90.7  PLT 201  --  172 202   No results for input(s): CKTOTAL, CKMB, CKMBINDEX, TROPONINI in the last 168 hours. No results for input(s): LABPROT, INR in the last 72 hours. No results for input(s): COLORURINE, LABSPEC, [place], GLUCOSEU, HGBUR, BILIRUBINUR, KETONESUR, PROTEINUR, UROBILINOGEN, NITRITE, LEUKOCYTESUR in the last 72 hours.  Invalid input(s): APPERANCEUR     Component Value Date/Time   CHOL 129 10/09/2014 0545   TRIG 76 10/09/2014 0545   HDL 50 10/09/2014 0545   CHOLHDL 2.6 10/09/2014 0545   VLDL 15  10/09/2014 0545   LDLCALC 64 10/09/2014 0545   Lab Results  Component Value Date   HGBA1C 5.7* 10/09/2014      Component Value Date/Time   LABOPIA NONE DETECTED 10/02/2014 1844   COCAINSCRNUR NONE DETECTED 10/02/2014 1844   LABBENZ NONE DETECTED 10/02/2014 1844   AMPHETMU NONE DETECTED 10/02/2014 1844   THCU NONE DETECTED 10/02/2014 1844   LABBARB NONE DETECTED 10/02/2014 1844    No results for input(s): ETH in the last 168 hours.  No results found.   2D echocardiogram with contrast 10/10/2014 Study Conclusions - Left ventricle: The cavity size was normal. Wall thickness was normal. Systolic function was normal. The estimated ejection fraction was  in the range of 60% to 65%. Wall motion was normal; there were no regional wall motion abnormalities. Doppler parameters are consistent with abnormal left ventricular relaxation (grade 1 diastolic dysfunction). - Aortic valve: There was no stenosis. - Mitral valve: There was no significant regurgitation. - Right ventricle: The cavity size was normal. Systolic function was normal. - Tricuspid valve: Peak RV-RA gradient 21 mmHg. - Systemic veins: IVC not visualized. Impressions: - Normal LV size and systolic function, EF 17-51%. Normal RV size and systolic function. No significant valvular abnormalities.   PHYSICAL EXAM Frail  elderly African-American gentleman currently not in distress. . Afebrile. Head is nontraumatic. Neck is supple without bruit.    Cardiac exam no murmur or gallop. Lungs are clear to auscultation. Distal pulses are well felt.  Neurological Exam : Awake alert oriented 2 only. Speech is nonfluent speech few words and short sentences only. Follows commands well. Extraocular movements are full range without nystagmus. His dense left homonymous hemianopsia. Mild left lower facial weakness. Tongue midline. Motor system exam reveals left hemiparesis with 2/5 strength on the left. Right upper extremity strength is normal. Right lower extremity strength is 1/5 with only trace withdrawal. Right plantar is downgoing left is upgoing. Sensation canal and cognition not reliably tested. Gait was not tested.    ASSESSMENT/PLAN Mr. [name] is a 64 y.o. male with history of hypertension, previous strokes with residual right hemiparesis, left hemianopia, post stroke seizures, and dementia presenting with worsening right hemiparesis and inability to walk.  He did not receive IV t-PA  due to late presentation - out of the therapeutic window for treatment.   Stroke: subacute left parasagittal parietal infarct. Dominant - possibly embolic  Left distal ACA territoryf rom an  unknown source.  Resultant  right  LE monoparesis and inability to walk.  MRI - from outside facility - subacute left posterior parietal infarct.  MRA  not performed  Carotid Doppler - 1-39% ICA stenosis. Vertebral artery flow is antegrade.   2D Echo EF 6065%. No cardiac source of emboli identified.   LDL 64  HgbA1c pending  Lovenox for VTE prophylaxis  Diet NPO time specified with thin liquids  aspirin 81 mg orally every day prior to admission, now on aspirin 325 mg orally every day Changed to Plavix  Ongoing aggressive stroke risk factor management  Therapy recommendations: CIR recommended. ( Screening ordered )  Disposition: Pending  Hypertension  Home meds: Bystolic 10 mg daily, Avapro 150 mg daily and Norvasc 10 mg daily,  Stable   Hyperlipidemia  Home meds:  No cholesterol lowering medications prior to admission.  LDL 64, goal < 70   Other Stroke Risk Factors  Advanced age  Hx stroke/TIA  Family hx stroke (brother)   Other Active Problems  Post stroke seizure history -  check Dilantin level   Other Pertinent History  Dementia  Plan  TEE and possible loop  today Cardiology notified.  Change aspirin to Plavix  Therapy evaluations - CIR recommended.  Hospital day # 3    Await transesophageal echocardiogram and if negative loop recorder implant. I had a long discussion with the daughter regarding his present stroke, significantly increased disability and likely need for 24-hour supervision and care and prolonged stay and rehabilitation in the future.anticipate transfer to inpatient rehabilitation over next few days.  Antony Contras, MD Medical Director [facility] Pager: [phone] 10/11/2014 3:02 PM     To contact Stroke Continuity provider, please refer to [email or web address] After hours, contact General Neurology

## 2014-10-11 NOTE — CV Procedure (Signed)
    Transesophageal Echocardiogram Note  Craig [name] [id number] 07-May-1951  Procedure: Transesophageal Echocardiogram Indications: CVA   Procedure Details Consent: Obtained Time Out: Verified patient identification, verified procedure, site/side was marked, verified correct patient position, special equipment/implants available, Radiology Safety Procedures followed,  medications/allergies/relevent history reviewed, required imaging and test results available.  Performed  Medications: Fentanyl: 50 mcg iv  Versed:  2 mg iv   Left Ventrical:  Normal LV    Mitral Valve: normal   Aortic Valve: normal   Tricuspid Valve: normal   Pulmonic Valve: normal   Left Atrium/ Left atrial appendage: clear, no LAA thrombus   Atrial septum: no PFO or ASD by color or bubble study   Aorta: normal.    Complications: No apparent complications Patient did tolerate procedure well.   Thayer Headings, Brooke Bonito., MD, [facility] 10/11/2014, 2:56 PM

## 2014-10-11 NOTE — H&P (View-Only) (Signed)
PROGRESS NOTE  Wymon Swaney GDJ:[id number] DOB: 1950/10/03 DOA: 10/08/2014 PCP: Wyatt Haste, MD  Interim Summary Patient is a pleasant 64 year old with a past medical history of hypertension, history of CVA with residual left-sided weakness and was admitted to the medicine service on 10/08/2014. He presented with right-sided hemiparesis associated with inability to ambulate. He had been seen in the emergency department earlier this week as there was concerns for seizure activity for which she saw Dr Jannifer Franklin of neurology undergoing EEG on 10/07/2014. The study showed diffuse background theta slowing, no evidence of epileptiform discharges seen. He had an MRI done at an outside facility which showed small left posterior parietal infarct. Patient was evaluated by neurology as his aspirin was changed to Plavix. Neurology suspecting that this represents thromboembolic phenomenon related to paroxysmal atrial fibrillation and recommends further workup with a transesophageal echocardiogram. Cardiology consulted. Plan for TEE and loop recorder placement in the next 24-48 hours.  Assessment/Plan: 1. Acute cerebrovascular accident -Patient presenting with right-sided hemiparesis, MRI done at outside facility showing subacute left posterior parietal infarct, possibly embolic source. -Neurology following, aspirin changed to Plavix -There is suspicion that this may reflect thromboembolism from a cardiac source. Cardiology was consulted for transesophageal echocardiogram and placement of a loop recorder (if TEE is negative) -Patient may be having paroxysmal atrial fibrillation. -A transthoracic echocardiogram performed on 10/10/2014 showed ejection fraction of 60-65% with grade 1 diastolic dysfunction -Carotid Dopplers showing 1-39% ICA stenosis -Physical therapy recommending inpatient rehabilitation-->CIR consulted  2. 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 Hypertension -Blood pressures in the 140s to 622W -restart bystolic   Code Status: Full code Family Communication: Spoke to his wife on phone 2/8 Disposition Plan: CIR        Procedures/Studies: Ct Head Wo Contrast  10/02/2014   CLINICAL DATA:  64 year old male with right-sided weakness. History of prior cerebral vascular accident with left-sided deficits.  EXAM: CT HEAD WITHOUT CONTRAST  TECHNIQUE: Contiguous axial images were obtained from the base of the skull through the vertex without intravenous contrast.  COMPARISON:  Head CT 11/09/2013.  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 Mild cerebral atrophy. Patchy and confluent areas of decreased attenuation are noted throughout the deep and periventricular white matter of the cerebral hemispheres bilaterally, compatible with chronic microvascular ischemic disease. No acute intracranial abnormalities. Specifically, no evidence of acute intracranial hemorrhage, no definite findings of acute/subacute cerebral ischemia, no mass, mass effect, hydrocephalus or abnormal intra or extra-axial fluid collections. No acute displaced skull fractures are identified. Extensive mucosal thickening associated with the visualized terminates. Left maxillary sinus is nearly completely opacified.  IMPRESSION: 1. No acute intracranial abnormalities. 2. 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 3. Mild cerebral atrophy with extensive chronic microvascular ischemic changes in cerebral white matter redemonstrated. 4. Paranasal sinus disease, as above.   Electronically Signed    By: Vinnie Langton M.D.   On: 10/02/2014 17:27         Subjective: Patient denies fevers, chills, headache, chest pain, dyspnea, nausea, vomiting, diarrhea, abdominal pain, dysuria, hematuria   Objective:  Filed Vitals:   10/10/14 2116 10/11/14 0257 10/11/14 0543 10/11/14 1043  BP: 152/78 180/91 177/105 172/96  Pulse: 75 78 95 97  Temp: 98.5 F (36.9 C) 98.4 F (36.9 C) 98.7 F (37.1 C) 97.1 F (36.2 C)  TempSrc: Oral Oral Oral Oral  Resp: 18 18 18 18   Height:      Weight:      SpO2: 100% 100% 98% 99%   No intake or output data in the 24 hours ending 10/11/14 1052 Weight change:  Exam:   General:  Pt is alert, follows commands appropriately, not in acute distress  HEENT: No icterus, No thrush,  [place]/AT  Cardiovascular: RRR, S1/S2, no rubs, no gallops  Respiratory: Poor inspiratory effort but clear to auscultation.  Abdomen: Soft/+BS, non tender, non distended, no guarding  Extremities: No edema, No lymphangitis, No petechiae, No rashes, no synovitis  Data Reviewed: Basic Metabolic Panel:  Recent Labs Lab 10/08/14 1459 10/08/14 1525 10/10/14 0642 10/11/14 0720  NA 142 143 144 141  K 4.0 4.0 3.8 4.1  CL 103 103 108 106  CO2 31  --  34* 29  GLUCOSE 103* 97 94 105*  BUN 9 12 10 11   CREATININE 1.06 1.00 1.13 1.09  CALCIUM 8.9  --  8.4 8.7   Liver Function Tests:  Recent Labs Lab 10/08/14 1459  AST 16  ALT 17  ALKPHOS 58  BILITOT 0.4  PROT 6.3  ALBUMIN 3.4*   No results for input(s): LIPASE, AMYLASE in the last 168 hours. No results for input(s): AMMONIA in the last 168 hours. CBC:  Recent Labs Lab 10/08/14 1459 10/08/14 1525 10/10/14 0642 10/11/14 0720  WBC 6.3  --  4.8 7.6  NEUTROABS 4.1  --   --   --   HGB 15.4 17.0 14.2 15.8  HCT 47.5 50.0 43.9 48.6  MCV 91.9  --  90.9 90.7  PLT 201  --  172 202   Cardiac Enzymes: No results for input(s): CKTOTAL, CKMB, CKMBINDEX, TROPONINI in the last 168 hours. BNP: Invalid input(s):  POCBNP CBG:  Recent Labs Lab 10/08/14 1656  GLUCAP 107*    No results found for this or any previous visit (from the past 240 hour(s)).   Scheduled Meds: . clopidogrel  75 mg Oral Daily  . divalproex  500 mg Oral TID  . enoxaparin (LOVENOX) injection  40 mg Subcutaneous Q24H  . lacosamide  125 mg Oral BID  . phenytoin  100 mg Oral BID   Continuous Infusions: . sodium chloride       Melyna Huron, DO  [facility] Pager [phone]  If 7PM-7AM, please contact night-coverage [email or web address] Password [email or web address] 10/11/2014, 10:52 AM   LOS: 3 days

## 2014-10-11 NOTE — Interval H&P Note (Signed)
History and Physical Interval Note:  10/11/2014 2:42 PM  Craig [name]  has presented today for surgery, with the diagnosis of stroke  The various methods of treatment have been discussed with the patient and family. After consideration of risks, benefits and other options for treatment, the patient has consented to  Procedure(s): TRANSESOPHAGEAL ECHOCARDIOGRAM (TEE) (N/A) as a surgical intervention .  The patient's history has been reviewed, patient examined, no change in status, stable for surgery.  I have reviewed the patient's chart and labs.  Questions were answered to the patient's satisfaction.     Christne Platts, Wonda Cheng

## 2014-10-11 NOTE — Progress Notes (Signed)
PT Cancellation Note  Patient Details Name: Craig [name] MRN: [id number] DOB: 1951/07/11   Cancelled Treatment:    Reason Eval/Treat Not Completed: Patient at procedure or test/unavailable.  Attempted to see patient x2 - in testing today.  Will return at a later time for PT session.   Despina Pole 10/11/2014, 2:49 PM Carita Pian. Sanjuana Kava, [place] Pager [phone]

## 2014-10-12 ENCOUNTER — Encounter (HOSPITAL_COMMUNITY): Payer: Self-pay | Admitting: Internal Medicine

## 2014-10-12 DIAGNOSIS — G8190 Hemiplegia, unspecified affecting unspecified side: Secondary | ICD-10-CM

## 2014-10-12 DIAGNOSIS — I639 Cerebral infarction, unspecified: Secondary | ICD-10-CM

## 2014-10-12 DIAGNOSIS — G40209 Localization-related (focal) (partial) symptomatic epilepsy and epileptic syndromes with complex partial seizures, not intractable, without status epilepticus: Secondary | ICD-10-CM

## 2014-10-12 DIAGNOSIS — H53462 Homonymous bilateral field defects, left side: Secondary | ICD-10-CM

## 2014-10-12 DIAGNOSIS — G8191 Hemiplegia, unspecified affecting right dominant side: Secondary | ICD-10-CM | POA: Insufficient documentation

## 2014-10-12 MED ORDER — CLOPIDOGREL BISULFATE 75 MG PO TABS: 75.0000 mg | ORAL_TABLET | Freq: Every day | ORAL | Status: DC

## 2014-10-12 MED ORDER — NEBIVOLOL HCL 10 MG PO TABS
10.0000 mg | ORAL_TABLET | Freq: Every day | ORAL | Status: DC
  Administered 2014-10-12 – 2014-10-14 (×3): 10 mg via ORAL
  Filled 2014-10-12 (×5): qty 1

## 2014-10-12 NOTE — Discharge Summary (Addendum)
Physician Discharge Summary  Craig [name] [id number]:[id number] DOB: March 01, 1951 DOA: 10/08/2014  PCP: Craig Haste, MD  Admit date: 10/08/2014 Discharge date: 10/12/2014  Recommendations for Outpatient Follow-up:  1. Pt will need to follow up with PCP in 2 weeks post discharge 2. Please obtain BMP and CBC in one week Discharge Diagnoses:  1. Acute cerebrovascular accident -Patient presenting with right-sided hemiparesis, MRI done at outside facility showing subacute left posterior parietal infarct, possibly embolic source. -Neurology following, aspirin changed to Plavix -There is suspicion that this may reflect thromboembolism from a cardiac source. Cardiology was consulted for transesophageal echocardiogram and placement of a loop recorder (if TEE is negative) -Patient may be having paroxysmal atrial fibrillation. -A transthoracic echocardiogram performed on 10/10/2014 showed ejection fraction of 60-65% with grade 1 diastolic dysfunction -Carotid Dopplers showing 1-39% ICA stenosis -Physical therapy recommending inpatient rehabilitation-->CIR consulted -10/11/14 TEE--no thrombi -10/11/14--loop recorder implanted by Dr. Rayann [name] -10/12/14--contacted cardiology about bleeding at surgical site-->cleaned and redressed 2. 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 Hypertension -Blood pressures in the 140s to 841Y -restart bystolic and Avapro -hold amlodipine for now   Code Status: Full code Family Communication: Spoke to his wife at bedside 10/12/14 Disposition Plan: CIR  Discharge Condition: stable  Disposition: CIR  Diet:heart healthy Wt Readings from Last 3 Encounters:  10/09/14 80.831 kg (178 lb 3.2 oz)  10/02/14 93.2 kg (205 lb 7.5 oz)  08/17/14 78.926 kg (174 lb)    History of present illness:  Patient is a pleasant 64 year old with a  past medical history of hypertension, history of CVA with residual left-sided weakness and was admitted to the medicine service on 10/08/2014. He presented with right-sided hemiparesis associated with inability to ambulate. He had been seen in the emergency department earlier this week as there was concerns for seizure activity for which she saw Dr Craig [name] of neurology undergoing EEG on 10/07/2014. The study showed diffuse background theta slowing, no evidence of epileptiform discharges seen. He had an MRI done at an outside facility which showed small left posterior parietal infarct. Patient was evaluated by neurology as his aspirin was changed to Plavix. Neurology suspecting that this represents thromboembolic phenomenon related to paroxysmal atrial fibrillation and recommends further workup with a transesophageal echocardiogram. Cardiology consulted. 10/11/2014, TEE was performed and did not reveal any thrombi. As result, I do Procardia was placed by cardiology, Dr. Rayann [name]. The patient did have some minor bleeding at the surgical site, but this was redressed and new Steri-Strips were placed. The patient remained hemodynamically stable and was stable for discharge transfer to rehabilitation.   Consultants: Neurology CIR Cardiology/EP  Discharge Exam: Filed Vitals:   10/12/14 0928  BP: 149/91  Pulse: 81  Temp: 99.1 F (37.3 C)  Resp: 18   Filed Vitals:   10/11/14 2127 10/12/14 0246 10/12/14 0640 10/12/14 0928  BP:  153/91 164/105 149/91  Pulse:  92 82 81  Temp:  100 F (37.8 C) 99.2 F (37.3 C) 99.1 F (37.3 C)  TempSrc:  Oral Oral Oral  Resp: 18 20  18   Height:      Weight:      SpO2:  98% 99% 99%   General: A&O x 3, NAD, pleasant, cooperative Cardiovascular: RRR, no rub, no gallop, no S3 Respiratory: CTAB, no wheeze, no rhonchi Abdomen:soft, nontender, nondistended, positive bowel sounds Extremities: No edema, No  lymphangitis, no petechiae  Discharge Instructions        Discharge Instructions    Diet - low sodium heart healthy    Complete by:  As directed      Increase activity slowly    Complete by:  As directed             Medication List    STOP taking these medications        amLODipine 10 MG tablet  Commonly known as:  NORVASC     aspirin EC 81 MG tablet      TAKE these medications        acetaminophen 325 MG tablet  Commonly known as:  TYLENOL  Take 325 mg by mouth every 6 (six) hours as needed for moderate pain.     BYSTOLIC 10 MG tablet  Generic drug:  nebivolol  TAKE 1 TABLET BY MOUTH DAILY     clopidogrel 75 MG tablet  Commonly known as:  PLAVIX  Take 1 tablet (75 mg total) by mouth daily.     divalproex 125 MG capsule  Commonly known as:  DEPAKOTE SPRINKLE  Take 500 mg by mouth 3 (three) times daily.     irbesartan 150 MG tablet  Commonly known as:  AVAPRO  TAKE 1 TABLET BY MOUTH DAILY     Lacosamide 10 MG/ML Soln  Commonly known as:  VIMPAT  Take 12.5 mLs (125 mg total) by mouth 2 (two) times daily.     phenytoin 50 MG tablet  Commonly known as:  DILANTIN  Chew 100 mg by mouth 2 (two) times daily. Chew tablet         The results of significant diagnostics from this hospitalization (including imaging, microbiology, ancillary and laboratory) are listed below for reference.    Significant Diagnostic Studies: Ct Head Wo Contrast  10/02/2014   CLINICAL DATA:  64 year old male with right-sided weakness. History of prior cerebral vascular accident with left-sided deficits.  EXAM: CT HEAD WITHOUT CONTRAST  TECHNIQUE: Contiguous axial images were obtained from the base of the skull through the vertex without intravenous contrast.  COMPARISON:  Head CT 11/09/2013.  FINDINGS: Large area of low attenuation in the posterior right temporal lobe extending into the right occipital lobe and in the medial aspect of the superior right parietal lobe, similar to the prior examination, compatible with encephalomalacia from old right  PCA territory infarction. Mild cerebral atrophy. Patchy and confluent areas of decreased attenuation are noted throughout the deep and periventricular white matter of the cerebral hemispheres bilaterally, compatible with chronic microvascular ischemic disease. No acute intracranial abnormalities. Specifically, no evidence of acute intracranial hemorrhage, no definite findings of acute/subacute cerebral ischemia, no mass, mass effect, hydrocephalus or abnormal intra or extra-axial fluid collections. No acute displaced skull fractures are identified. Extensive mucosal thickening associated with the visualized terminates. Left maxillary sinus is nearly completely opacified.  IMPRESSION: 1. No acute intracranial abnormalities. 2. 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 3. Mild cerebral atrophy with extensive chronic microvascular ischemic changes in cerebral white matter redemonstrated. 4. Paranasal sinus disease, as above.   Electronically Signed   By: Vinnie Langton M.D.   On: 10/02/2014 17:27     Microbiology: No results found for this or any previous visit (from the past 240 hour(s)).   Labs: Basic Metabolic Panel:  Recent Labs Lab 10/08/14 1459 10/08/14 1525 10/10/14 0642 10/11/14 0720  NA 142 143 144 141  K 4.0 4.0 3.8 4.1  CL 103  103 108 106  CO2 31  --  34* 29  GLUCOSE 103* 97 94 105*  BUN 9 12 10 11   CREATININE 1.06 1.00 1.13 1.09  CALCIUM 8.9  --  8.4 8.7   Liver Function Tests:  Recent Labs Lab 10/08/14 1459  AST 16  ALT 17  ALKPHOS 58  BILITOT 0.4  PROT 6.3  ALBUMIN 3.4*   No results for input(s): LIPASE, AMYLASE in the last 168 hours. No results for input(s): AMMONIA in the last 168 hours. CBC:  Recent Labs Lab 10/08/14 1459 10/08/14 1525 10/10/14 0642 10/11/14 0720  WBC 6.3  --  4.8 7.6  NEUTROABS 4.1  --   --   --   HGB 15.4 17.0 14.2 15.8  HCT 47.5 50.0 43.9 48.6  MCV 91.9  --  90.9 90.7  PLT 201  --  172 202    Cardiac Enzymes: No results for input(s): CKTOTAL, CKMB, CKMBINDEX, TROPONINI in the last 168 hours. BNP: Invalid input(s): POCBNP CBG:  Recent Labs Lab 10/08/14 1656  GLUCAP 107*    Time coordinating discharge:  Greater than 30 minutes  Signed:  Nena Alexander MD [facility]

## 2014-10-12 NOTE — Progress Notes (Signed)
STROKE TEAM PROGRESS NOTE   HISTORY Craig [name] is a 64 y.o. 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 Wife provides most of the history and tells me that last Saturday she brought him to this ED because " she was staring off, was poorly responsive, and his right side stop working to the point that he couldn't ambulate". ED thought that he had a "petit mal" seizure and his Vimpat was increased.  I did review the detailed note from 2/4 by Dr. Jaynee [name] at [facility]. Had EEG That same day that was reported as " abnormal EEG recording secondary to diffuse background theta slowing. This is a nonspecific recording, and can be seen with any process that results in a toxic or metabolic encephalopathy, or any dementing illness. No evidence of epileptiform discharges were seen". In addition, MRI brain was done and revealed " a small posterior parietal infarct". As per wife, he did not have any seizures since last March until last Saturday. Takes Depakote, Vimpat, and Dilantin. Patient is now unable to walk. Denies HA, vertigo, double vision, slurred speech, or language impairment. Said that he can not see well on the left visual field. On aspirin.   Date last known well: uncertain Time last known well: uncertain tPA Given: no, out of the window   SUBJECTIVE (INTERVAL HISTORY) The patient's daughters is not at the bedside.  Darden Dates showed no cardaic source of embolism and had loop recorder inserted y`day. Await insurance approval for rehab  OBJECTIVE Temp:  [98.2 F (36.8 C)-100 F (37.8 C)] 99.1 F (37.3 C) (02/09 0928) Pulse Rate:  [61-97] 81 (02/09  0928) Cardiac Rhythm:  [-]  Resp:  [15-21] 18 (02/09 0928) BP: (137-164)/(80-105) 149/91 mmHg (02/09 0928) SpO2:  [94 %-99 %] 99 % (02/09 0928)   Recent Labs Lab 10/08/14 1656  GLUCAP 107*    Recent Labs Lab 10/08/14 1459 10/08/14 1525 10/10/14 0642 10/11/14 0720  NA 142 143 144 141  K 4.0 4.0 3.8 4.1  CL 103 103 108 106  CO2 31  --  34* 29  GLUCOSE 103* 97 94 105*  BUN 9 12 10 11   CREATININE 1.06 1.00 1.13 1.09  CALCIUM 8.9  --  8.4 8.7    Recent Labs Lab 10/08/14 1459  AST 16  ALT 17  ALKPHOS 58  BILITOT 0.4  PROT 6.3  ALBUMIN 3.4*    Recent Labs Lab 10/08/14 1459 10/08/14 1525 10/10/14 0642 10/11/14 0720  WBC 6.3  --  4.8 7.6  NEUTROABS 4.1  --   --   --   HGB 15.4 17.0 14.2 15.8  HCT 47.5 50.0 43.9 48.6  MCV 91.9  --  90.9 90.7  PLT 201  --  172 202   No results for input(s): CKTOTAL, CKMB, CKMBINDEX, TROPONINI in the last 168 hours. No results for input(s): LABPROT, INR in the last 72 hours. No results for input(s): COLORURINE, LABSPEC, [place], GLUCOSEU, HGBUR, BILIRUBINUR, KETONESUR, PROTEINUR, UROBILINOGEN, NITRITE, LEUKOCYTESUR in the last 72 hours.  Invalid input(s): APPERANCEUR     Component Value Date/Time   CHOL 129 10/09/2014 0545   TRIG 76 10/09/2014 0545   HDL 50 10/09/2014 0545   CHOLHDL 2.6 10/09/2014 0545  VLDL 15 10/09/2014 0545   LDLCALC 64 10/09/2014 0545   Lab Results  Component Value Date   HGBA1C 5.7* 10/09/2014      Component Value Date/Time   LABOPIA NONE DETECTED 10/02/2014 1844   COCAINSCRNUR NONE DETECTED 10/02/2014 1844   LABBENZ NONE DETECTED 10/02/2014 1844   AMPHETMU NONE DETECTED 10/02/2014 1844   THCU NONE DETECTED 10/02/2014 1844   LABBARB NONE DETECTED 10/02/2014 1844    No results for input(s): ETH in the last 168 hours.  No results found.   2D echocardiogram with contrast 10/10/2014 Study Conclusions - Left ventricle: The cavity size was normal. Wall thickness was normal. Systolic  function was normal. The estimated ejection fraction was in the range of 60% to 65%. Wall motion was normal; there were no regional wall motion abnormalities. Doppler parameters are consistent with abnormal left ventricular relaxation (grade 1 diastolic dysfunction). - Aortic valve: There was no stenosis. - Mitral valve: There was no significant regurgitation. - Right ventricle: The cavity size was normal. Systolic function was normal. - Tricuspid valve: Peak RV-RA gradient 21 mmHg. - Systemic veins: IVC not visualized. Impressions: - Normal LV size and systolic function, EF 97-02%. Normal RV size and systolic function. No significant valvular abnormalities.   PHYSICAL EXAM Frail  elderly African-American gentleman currently not in distress. . Afebrile. Head is nontraumatic. Neck is supple without bruit.    Cardiac exam no murmur or gallop. Lungs are clear to auscultation. Distal pulses are well felt.  Neurological Exam : Awake alert oriented 2 only. Speech is nonfluent speech few words and short sentences only. Follows commands well. Extraocular movements are full range without nystagmus. His dense left homonymous hemianopsia. Mild left lower facial weakness. Tongue midline. Motor system exam reveals left hemiparesis with 2/5 strength on the left. Right upper extremity strength is normal. Right lower extremity strength is 1/5 with only trace withdrawal. Right plantar is downgoing left is upgoing. Sensation canal and cognition not reliably tested. Gait was not tested.    ASSESSMENT/PLAN [name] is a 64 y.o. male with history of hypertension, previous strokes with residual right hemiparesis, left hemianopia, post stroke seizures, and dementia presenting with worsening right hemiparesis and inability to walk.  He did not receive IV t-PA  due to late presentation - out of the therapeutic window for treatment.   Stroke: subacute left parasagittal parietal infarct.  Dominant - possibly embolic  Left distal ACA territoryf rom an unknown source.  Resultant  right  LE monoparesis and inability to walk.  MRI - from outside facility - subacute left posterior parietal infarct.  MRA  not performed  Carotid Doppler - 1-39% ICA stenosis. Vertebral artery flow is antegrade.   2D Echo EF 6065%. No cardiac source of emboli identified.   LDL 64  HgbA1c 5.7  Lovenox for VTE prophylaxis Diet Heart  Diet - low sodium heart healthy with thin liquids  aspirin 81 mg orally every day prior to admission, now on aspirin 325 mg orally every day Changed to Plavix  Ongoing aggressive stroke risk factor management  Therapy recommendations: CIR recommended. ( Screening ordered )  Disposition: Pending  Hypertension  Home meds: Bystolic 10 mg daily, Avapro 150 mg daily and Norvasc 10 mg daily,  Stable   Hyperlipidemia  Home meds:  No cholesterol lowering medications prior to admission.  LDL 64, goal < 70   Other Stroke Risk Factors  Advanced age  Hx stroke/TIA  Family hx stroke (brother)   Other Active Problems  Post stroke seizure history - check Dilantin level   Other Pertinent History  Dementia  Plan   .  Change aspirin to Plavix  Therapy evaluations - CIR recommended.  Hospital day # 4     Anticipate transfer to inpatient rehabilitation over next few days. Stroke team will sign off. Follow-up as an outpatient in 2 months. Kindly call for questions.  Craig Contras, Craig [name] Medical Director [facility] Pager: [phone] 10/12/2014 3:03 PM     To contact Stroke Continuity provider, please refer to [email or web address] After hours, contact General Neurology

## 2014-10-12 NOTE — Clinical Social Work Placement (Addendum)
Clinical Social Work Department CLINICAL SOCIAL WORK PLACEMENT NOTE 10/12/2014  Patient:  Craig [name], Craig [name]  Account Number:  [id number] [place] date:  10/08/2014  Clinical Social Worker:  Glendon Axe, CLINICAL SOCIAL WORKER  Date/time:  10/12/2014 06:21 PM  Clinical Social Work is seeking post-discharge placement for this patient at the following level of care:   SKILLED NURSING   (*CSW will update this form in Epic as items are completed)   10/12/2014  Patient/family provided with [place] Department of Clinical Social Work's list of facilities offering this level of care within the geographic area requested by the patient (or if unable, by the patient's family).  10/12/2014  Patient/family informed of their freedom to choose among providers that offer the needed level of care, that participate in Medicare, Medicaid or managed care program needed by the patient, have an available bed and are willing to accept the patient.  10/12/2014  Patient/family informed of [facility]' ownership interest in [facility], as well as of the fact that they are under no obligation to receive care at this facility.  PASARR submitted to EDS on 10/12/2014 PASARR number received on 10/12/2014  FL2 transmitted to all facilities in geographic area requested by pt/family on  10/12/2014 FL2 transmitted to all facilities within larger geographic area on   Patient informed that his/her managed care company has contracts with or will negotiate with  certain facilities, including the following:   YES     Patient/family informed of bed offers received:  10/14/14 [facility] [place], [place], [place]) Patient chooses bed at [facility] ([place], [place], [phone]) Physician recommends and patient chooses bed at    Patient to be transferred to [facility] on 10/15/14 [facility] [place], [place], [phone])  Patient to be transferred to facility by [facility], [place], [phone]) Patient and family notified of  transfer on 10/15/14 [facility] [place], [place], [place]) Name of family member notified:  Mrs. Cress, pt wife  The following physician request were entered in Epic:   Additional Comments:  Domenica Reamer, [place] Worker [place], MSW, [place] [phone] 10/12/2014 6:22 PM

## 2014-10-12 NOTE — Progress Notes (Signed)
Physical Therapy Treatment Patient Details Name: Craig [name] MRN: [id number] DOB: 08-26-1951 Today's Date: 10/12/2014    History of Present Illness Patient is a 64 yo male admitted 10/08/14 with Rt sided weakness, inability to walk, and decreased speech.  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    PT Comments    Pt. Was meeting with [facility] when I left the room. Wife was present during therapy and noted that he had back pain because pt was unable to vocalize. Pt. Would benefit from more sitting balance to increase trunk control. Can attempt mirroring for posture next session.  Follow Up Recommendations  CIR;Supervision/Assistance - 24 hour     Equipment Recommendations  None recommended by PT    Recommendations for Other Services       Precautions / Restrictions Precautions Precautions: Fall Restrictions Weight Bearing Restrictions: No    Mobility  Bed Mobility Overal bed mobility: Needs Assistance;+2 for physical assistance Bed Mobility: Rolling;Sidelying to Sit Rolling: +2 for physical assistance;Max assist Sidelying to sit: Mod assist;+2 for physical assistance       General bed mobility comments: Pt performed rolling with verbal and tactile cues, max a for trunk and legs  Transfers                 General transfer comment: Not completed at this time due to safety.   Ambulation/Gait                 Stairs            Wheelchair Mobility    Modified Rankin (Stroke Patients Only)       Balance Overall balance assessment: Needs assistance Sitting-balance support: Bilateral upper extremity supported Sitting balance-Leahy Scale: Zero Sitting balance - Comments: Patient requires max assist to maintain sitting balance, leaning posteriorly and to right.  Worked on moving to midline with trunk and head. Then worked on tracking head and eyes to right. able to track head but not eyes. Postural control: Posterior  lean;Right lateral lean                          Cognition Arousal/Alertness: Awake/alert Behavior During Therapy: WFL for tasks assessed/performed Overall Cognitive Status: Difficult to assess                      Exercises Other Exercises Other Exercises: D1 and 2 PNF patterns UE Rt arm in supine x 5    General Comments        Pertinent Vitals/Pain Pain Assessment: Faces Pain Score: 6  Pain Location: back Pain Descriptors / Indicators:  (unable to verbalize) Pain Intervention(s): Monitored during session;Limited activity within patient's tolerance    Home Living                      Prior Function            PT Goals (current goals can now be found in the care plan section) Progress towards PT goals: Progressing toward goals    Frequency  Min 4X/week    PT Plan Current plan remains appropriate    Co-evaluation             End of Session   Activity Tolerance: Patient tolerated treatment well Patient left: in bed;with nursing/sitter in room     Time: 0840-0910 PT Time Calculation (min) (ACUTE ONLY): 30 min  Charges:  G Codes:      Jodi Geralds, SPTA 10/12/2014, 10:22 AM

## 2014-10-12 NOTE — Progress Notes (Signed)
Called to see patient for bleeding around loop recorder site.  Site cleaned, pressure held, new steri-strips and tegaderm applied.  Please call with questions.   Chanetta Marshall, RN 10/12/2014 10:19 AM  Pager - [phone]

## 2014-10-12 NOTE — Clinical Social Work Psychosocial (Signed)
Clinical Social Work Department BRIEF PSYCHOSOCIAL ASSESSMENT 10/12/2014  Patient:  Craig [name], Craig [name]     Account Number:  [id number]     Admit date:  10/08/2014  Clinical Social Worker:  Glendon Axe, CLINICAL SOCIAL WORKER  Date/Time:  10/12/2014 05:52 PM  Referred by:  Physician  Date Referred:  10/12/2014 Referred for  SNF Placement   Other Referral:   Interview type:  Other - See comment Other interview type:   CSW met with patient's wife, Aquilla Voiles.    PSYCHOSOCIAL DATA Living Status:  WIFE Admitted from facility:  n/a Level of care:  N/a  Primary support name:  Hamad Whyte Primary support relationship to patient:  SPOUSE Degree of support available:   Strong    CURRENT CONCERNS Current Concerns  Post-Acute Placement   Other Concerns:    SOCIAL WORK ASSESSMENT / PLAN Clinical Social Worker met with pt and pt's wife in reference to post-acute placement for SNF. CSW introduced CSW role and SNF process. CSW presented SNF as an alternative plan. Pt refused to participate in assessment and CSW and pt's wife continued assessment outside of pt's room. Pt's wife reported pt was accepted into CIR however insurance Josem Kaufmann is pending. Pt's wife expressed a strong desire for pt to be admitted into CIR and was stated "my husband will have a fit when he hears nursing home." Pt's wife very reluctant in regards to SNF placement however agreeable to SNF search. CSW reviewed SNF list throughly with pt's wife and explained process again. CSW will continue to follow pt and pt's family for continued support and to facilitate pt's discharge needs.   Assessment/plan status:  Psychosocial Support/Ongoing Assessment of Needs Other assessment/ plan:   FL-2 completed and on chart for MD signature.   Information/referral to community resources:   SNF information/list provided.    PATIENT'S/FAMILY'S RESPONSE TO PLAN OF CARE: Pt lying in bed and declined to participate in assessment. Pt's wife  smiling and expressed a desire for pt to be admitted into [facility] however agreeable to SNF search as an alternative plan. Pt's wife pleasant, smiling and appreciated social work intervention.       Glendon Axe, MSW, LCSWA [phone] 10/12/2014 6:20 PM

## 2014-10-12 NOTE — Progress Notes (Signed)
Speech Language Pathology Treatment: Cognitive-Linquistic  Patient Details Name: Craig [name] MRN: [id number] DOB: February 18, 1951 Today's Date: 10/12/2014 Time: 5427-0623 SLP Time Calculation (min) (ACUTE ONLY): 30 min  Assessment / Plan / Recommendation Clinical Impression  Pt with no attempted verbalizations during this session, primarily closing eyes, but nods head, and smiles at times, in response to questions/jokes.  Pt appears to have good receptive language, but would not attempt automatic sequences, singing, or sentence completion tasks.  Educated wife to tasks to attempt when he is more interactive (it is 1615 and pt appears tired.  Pt would be a good Rehab candidate.  Wife reports insurance is pending.   HPI HPI: Pt is a 64 y.o. male with PMH HTN, h/o CVAs-residual L sided weakness, Seizures recently seen in ED (6 days ago) for staring spells felt to be related to seizures evaluated at outpatient neurology office yesterday for with EEG, MRI and found to have subacute L parietal lobe CVA; Patient reports chronic L sided weakness, but recently developed R sided leg weakness with inability to ambulate.    Pertinent Vitals Pain Assessment: No/denies pain  SLP Plan  Continue with current plan of care    Recommendations                General recommendations: Rehab consult Follow up Recommendations: Inpatient Rehab Plan: Continue with current plan of care    GO     Quinn Axe T 10/12/2014, 4:14 PM

## 2014-10-12 NOTE — Clinical Social Work Note (Signed)
Clinical Social Worker has assessed patient and pt's family. Full psychosocial to follow.   Glendon Axe, MSW, LCSWA [phone] 10/12/2014 3:13 PM

## 2014-10-12 NOTE — Progress Notes (Signed)
UR complete.  Quinlynn Cuthbert RN, MSN 

## 2014-10-12 NOTE — Care Management Note (Signed)
    Page 1 of 1   10/15/2014     2:51:11 PM CARE MANAGEMENT NOTE 10/15/2014  Patient:  Craig [name], Craig [name]   Account Number:  [id number]  Date Initiated:  10/12/2014  Documentation initiated by:  Lorne Skeens  Subjective/Objective Assessment:   Patient was admitted with CVA. Lives at home with wife. Has a [place] aide.     Action/Plan:   Will follow for discharge needs pending PT/OT evals and physician orders.   Anticipated DC Date:  10/13/2014   Anticipated DC Plan:  IP REHAB FACILITY  In-house referral  Clinical Social Worker         Choice offered to / List presented to:             Status of service:  Completed, signed off Medicare Important Message given?  YES (If response is "NO", the following Medicare IM given date fields will be blank) Date Medicare IM given:  10/12/2014 Medicare IM given by:  Lorne Skeens Date Additional Medicare IM given:  10/14/2014 Additional Medicare IM given by:  Lorne Skeens  Discharge Disposition:    Per UR Regulation:  Reviewed for med. necessity/level of care/duration of stay  If discussed at [place] of Stay Meetings, dates discussed:    Comments:  10/12/14 [place], MSN, CM- Medicare IM letter provided.

## 2014-10-12 NOTE — Progress Notes (Signed)
Rehab admissions - I met with pt in follow up to rehab consult and no family were in the room. Pt was minimally responsive but did open his eyes briefly. He was not able to answer any of my questions. Informational brochures were left in pt's room.  I then called pt's wife to explain the possibility of inpatient rehab and she was interested in pursuing this for her husband. Wife shared that pt has a private caregiver for 5-6 hours/day while she works and that their son cares for the pt after the aide leaves until she gets home from work. Further questions were answered about our rehab program. Pt was at [facility] in 2013 and she was already very familiar with the program.  I clarified with pt's wife that pt's primary insurance in [employer] and his secondary insurance is [employer]. We will need insurance authorization from [employer] to consider a possible inpatient rehab admit and I will now open his case with [employer].  I will keep the pt/family and medical team aware of any updates as I wait to hear from [employer].  I spoke with Courtney, case manager about pt's case. Please call me with any questions. Thanks.   , PT Rehabilitation Admissions Coordinator [phone]  

## 2014-10-13 NOTE — Progress Notes (Signed)
Physical Therapy Treatment Patient Details Name: Craig [name] MRN: [id number] DOB: Jan 11, 1951 Today's Date: 10/13/2014    History of Present Illness Patient is a 64 yo male admitted 10/08/14 with Rt sided weakness, inability to walk, and decreased speech.  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    PT Comments    Patient progressing slowly with mobility. Continues to exhibit impaired trunk control and sitting balance requiring Max A to maintain upright. Not able to initiate any trunk musculature even with max manual and verbal cues. Transferred to chair today with maxi move. Not sure if pt will be able to tolerate intensity of CIR at this point. No muscle activation noted in BLEs today during testing. Will continue to follow acutely.   Follow Up Recommendations  CIR;Supervision/Assistance - 24 hour     Equipment Recommendations  None recommended by PT    Recommendations for Other Services       Precautions / Restrictions Precautions Precautions: Fall Restrictions Weight Bearing Restrictions: No    Mobility  Bed Mobility Overal bed mobility: Needs Assistance Bed Mobility: Rolling;Sidelying to Sit Rolling: Total assist;+2 for physical assistance Sidelying to sit: Total assist;+2 for physical assistance       General bed mobility comments: Pt provided miminial assist with bed mobility. Able to use RUE to assist with rolling with manual placement. Total A for BLEs and to elevate trunk.  Transfers Overall transfer level: Needs assistance               General transfer comment: Maxi move lift to transfer to chair. Total A.  Ambulation/Gait                 Stairs            Wheelchair Mobility    Modified Rankin (Stroke Patients Only) Modified Rankin (Stroke Patients Only) Pre-Morbid Rankin Score: Moderate disability Modified Rankin: Severe disability     Balance Overall balance assessment: Needs  assistance Sitting-balance support: Feet supported;Single extremity supported Sitting balance-Leahy Scale: Zero Sitting balance - Comments: Requires Max-Total A to maintain static sitting balance, leaning posteriorly and to right. Neck flexed. Tried to facilitate trunk musculature for sitting balance with max manual and verbal cues however pt not able to initiate. Worked on dynamic siitting balance when trying to put pad underneath patient. No balance reactions noted. Emphasized increased WB through LUE. Postural control: Posterior lean;Right lateral lean                          Cognition Arousal/Alertness: Awake/alert Behavior During Therapy: Flat affect Overall Cognitive Status: Difficult to assess (some smiles noted during session however non verbal and not responding consistently to yes or no questions)                      Exercises      General Comments        Pertinent Vitals/Pain Pain Assessment: Faces Faces Pain Scale: Hurts little more Pain Location: back Pain Descriptors / Indicators:  (unable to verbalize.) Pain Intervention(s): Monitored during session;Repositioned    Home Living                      Prior Function            PT Goals (current goals can now be found in the care plan section) Progress towards PT goals: Progressing toward goals    Frequency  Min 4X/week    PT Plan Current plan remains appropriate    Co-evaluation PT/OT/SLP Co-Evaluation/Treatment: Yes Reason for Co-Treatment: For patient/therapist safety PT goals addressed during session: Mobility/safety with mobility;Balance       End of Session   Activity Tolerance: Patient tolerated treatment well Patient left: in chair;with call bell/phone within reach;with chair alarm set;with family/visitor present (maxi move pad under patient.)     Time: 1126-1150 PT Time Calculation (min) (ACUTE ONLY): 24 min  Charges:  $Neuromuscular Re-education: 8-22  mins                    G CodesCandy Sledge A 11/02/2014, 12:17 PM  Candy Sledge, [place], DPT [phone]

## 2014-10-13 NOTE — Progress Notes (Signed)
Occupational Therapy Treatment Patient Details Name: Craig [name] MRN: [id number] DOB: [birth year] Today's Date: 10/13/2014    History of present illness Patient is a 64 yo male admitted 10/08/14 with Rt sided weakness, inability to walk, and decreased speech.  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   OT comments  Pt seen today to address functional mobility and ADLs. Pt required total A for bed mobility and EOB sitting and demonstrated some use of RUE to assist on bedrail. LUE continues to present with involuntary movement. PT/OT assisted to into recliner chair. Continue to recommend CIR to return pt to baseline with family assist at home.   Follow Up Recommendations  CIR;Supervision/Assistance - 24 hour    Equipment Recommendations  Other (comment) (TBD)    Recommendations for Other Services      Precautions / Restrictions Precautions Precautions: Fall Restrictions Weight Bearing Restrictions: No       Mobility Bed Mobility Overal bed mobility: Needs Assistance Bed Mobility: Rolling;Sidelying to Sit Rolling: Total assist;+2 for physical assistance Sidelying to sit: Total assist;+2 for physical assistance       General bed mobility comments: Pt provided miminial assist with bed mobility. Able to use RUE to assist with rolling with manual placement. Total A for BLEs and to elevate trunk.  Transfers Overall transfer level: Needs assistance               General transfer comment: Maxi move lift to transfer to chair. Total A.    Balance Overall balance assessment: Needs assistance Sitting-balance support: Single extremity supported;Feet supported Sitting balance-Leahy Scale: Zero Sitting balance - Comments: Requires Max-Total A to maintain static sitting balance, leaning posteriorly and to right. Neck flexed. Tried to facilitate trunk musculature for sitting balance with max manual and verbal cues however pt not able to initiate.  Worked on dynamic siitting balance when trying to put pad underneath patient. No balance reactions noted. Emphasized increased WB through LUE. Postural control: Posterior lean;Right lateral lean                         ADL Overall ADL's : Needs assistance/impaired                                       General ADL Comments: Pt appears solemnant and with less verbalizations today. Smiling at jokes, but no attempt to speak. Pt smiled when asked if he would like to get OOB and shook his head yes when asked if his back hurt. Pt shakes his head yes appropriately. Pt now with increased R side field attention and gaze but demonstrates poor head control in sitting. RUE appears to have less voluntary movement, however pt was able to hold onto bed rail to assist with supine>sit transition.                 Cognition  Arousal/Alertness: Awake/Alert Behavior During Therapy: Flat affect Overall Cognitive Status: Difficult to assess ((some smiles noted during session however non verbal and not)                            Exercises Gentle PROM provided to RUE and LUE in sitting       Pertinent Vitals/ Pain       Pain Assessment: Faces Faces Pain Scale: Hurts little more Pain  Location: back  Pain Descriptors / Indicators:  (unable to verbalize) Pain Intervention(s): Monitored during session;Repositioned         Frequency Min 3X/week     Progress Toward Goals  OT Goals(current goals can now be found in the care plan section)  Progress towards OT goals: Progressing toward goals  Acute Rehab OT Goals Patient Stated Goal: none stated OT Goal Formulation: With patient Time For Goal Achievement: 10/23/14 Potential to Achieve Goals: Good  Plan Discharge plan remains appropriate    Co-evaluation    PT/OT/SLP Co-Evaluation/Treatment: Yes Reason for Co-Treatment: For patient/therapist safety PT goals addressed during session: Mobility/safety with  mobility;Balance OT goals addressed during session: ADL's and self-care      End of Session Equipment Utilized During Treatment: Gait belt;Other (comment) (maxi move)   Activity Tolerance Patient tolerated treatment well   Patient Left in chair;with call bell/phone within reach;with chair alarm set;with family/visitor present;Other (comment) (with lift pad)   Nurse Communication          Time: 7867-6720 OT Time Calculation (min): 24 min  Charges: OT General Charges $OT Visit: 1 Procedure OT Treatments $Self Care/Home Management : 8-22 mins  Villa Herb M 10/13/2014, 3:17 PM  Cyndie Chime, OTR/L Occupational Therapist [phone] (pager)

## 2014-10-13 NOTE — Progress Notes (Signed)
SLP Cancellation Note  Patient Details Name: Craig [name] MRN: [id number] DOB: [birth year]   Cancelled treatment:        Attempted speech therapy treatment for aphasia/apraxia.  Pt initially had eyes open when SLP entered room, but closed his eyes when SLP approached, and would not respond further.  Wife present, encouraging pt to participate.  Max attempts by SLP and pt's wife, to no avail.     Quinn Axe T 10/13/2014, 4:31 PM

## 2014-10-13 NOTE — Progress Notes (Signed)
Rehab admissions - I am following pt's case and have not yet heard a determination from [facility]. I met with pt and his wife earlier today to share that we are still waiting on approval for inpatient rehab.  I clarified with [employer] that they are pt's secondary insurance and that no prior authorization is needed for inpatient rehab from [facility].  I will keep the pt/family and medical team aware of any updates as I continue to wait for insurance response.  I updated Loma Sousa, case Freight forwarder as well.  Please call me with any questions. Thanks.  Nanetta Batty, PT Rehabilitation Admissions Coordinator [phone]

## 2014-10-13 NOTE — Progress Notes (Signed)
PATIENT DETAILS Name: Craig [name] Age: 64 y.o. Sex: male Date of Birth: [birth year] Admit Date: 10/08/2014 Admitting Physician Kinnie Feil, MD OEH:[id number] CHARLES, MD  Subjective: No major issues overnight. Awaiting bed at CIR  Assessment/Plan: Active Problems:   Acute CVA (cerebral infarction): admitted and underwent extensive workup (see discharge summary). Initially on aspirin, now on Plavix. Awaiting bed at CIR.      Hypertension: Continue with Bystolic     history of seizure disorder: Continue with Depakote, Dilantin and Vimpat  Disposition: Remain inpatient-to CIR when bed available   Antibiotics:  Anti-infectives    None     DVT Prophylaxis: Prophylactic Lovenox  Code Status: Full code   Family Communication None at bedside  Procedures:  None  CONSULTS:  neurology  Time spent 40 minutes-which includes 50% of the time with face-to-face with patient/ family and coordinating care related to the above assessment and plan.  MEDICATIONS: Scheduled Meds: . clopidogrel  75 mg Oral Daily  . divalproex  500 mg Oral TID  . enoxaparin (LOVENOX) injection  40 mg Subcutaneous Q24H  . lacosamide  125 mg Oral BID  . nebivolol  10 mg Oral Daily  . phenytoin  100 mg Oral BID   Continuous Infusions:  PRN Meds:.acetaminophen, hydrALAZINE, senna-docusate    PHYSICAL EXAM: Vital signs in last 24 hours: Filed Vitals:   10/12/14 2215 10/13/14 0225 10/13/14 0612 10/13/14 1052  BP: 126/69 150/78 184/85 144/86  Pulse: 86 76 83 80  Temp: 99.1 F (37.3 C) 98.8 F (37.1 C) 98.6 F (37 C) 97.4 F (36.3 C)  TempSrc: Oral Oral Oral Oral  Resp: 18 18 18 20   Height:      Weight:      SpO2: 99% 97% 99% 100%    Weight change:  Filed Weights   10/09/14 0700  Weight: 80.831 kg (178 lb 3.2 oz)   Body mass index is 25.57 kg/(m^2).   Gen Exam: Awake, non-fluent speech, follows some commands  Neck: Supple, No JVD.  Chest: B/L Clear.   CVS: S1 S2  Regular, no murmurs.  Abdomen: soft, BS +, non tender, non distended.   Intake/Output from previous day: No intake or output data in the 24 hours ending 10/13/14 1340   LAB RESULTS: CBC  Recent Labs Lab 10/08/14 1459 10/08/14 1525 10/10/14 0642 10/11/14 0720  WBC 6.3  --  4.8 7.6  HGB 15.4 17.0 14.2 15.8  HCT 47.5 50.0 43.9 48.6  PLT 201  --  172 202  MCV 91.9  --  90.9 90.7  MCH 29.8  --  29.4 29.5  MCHC 32.4  --  32.3 32.5  RDW 13.1  --  12.8 12.8  LYMPHSABS 1.2  --   --   --   MONOABS 0.9  --   --   --   EOSABS 0.2  --   --   --   BASOSABS 0.0  --   --   --     Chemistries   Recent Labs Lab 10/08/14 1459 10/08/14 1525 10/10/14 0642 10/11/14 0720  NA 142 143 144 141  K 4.0 4.0 3.8 4.1  CL 103 103 108 106  CO2 31  --  34* 29  GLUCOSE 103* 97 94 105*  BUN 9 12 10 11   CREATININE 1.06 1.00 1.13 1.09  CALCIUM 8.9  --  8.4 8.7    CBG:  Recent Labs Lab 10/08/14 1656  GLUCAP 107*  GFR Estimated Creatinine Clearance: 72.6 mL/min (by C-G formula based on Cr of 1.09).  Coagulation profile  Recent Labs Lab 10/08/14 1459  INR 0.98    Cardiac Enzymes No results for input(s): CKMB, TROPONINI, MYOGLOBIN in the last 168 hours.  Invalid input(s): CK  Invalid input(s): POCBNP No results for input(s): DDIMER in the last 72 hours. No results for input(s): HGBA1C in the last 72 hours. No results for input(s): CHOL, HDL, LDLCALC, TRIG, CHOLHDL, LDLDIRECT in the last 72 hours. No results for input(s): TSH, T4TOTAL, T3FREE, THYROIDAB in the last 72 hours.  Invalid input(s): FREET3 No results for input(s): VITAMINB12, FOLATE, FERRITIN, TIBC, IRON, RETICCTPCT in the last 72 hours. No results for input(s): LIPASE, AMYLASE in the last 72 hours.  Urine Studies No results for input(s): UHGB, CRYS in the last 72 hours.  Invalid input(s): UACOL, UAPR, USPG, UPH, UTP, UGL, UKET, UBIL, UNIT, UROB, ULEU, UEPI, UWBC, URBC, UBAC, CAST, UCOM,  BILUA  MICROBIOLOGY: No results found for this or any previous visit (from the past 240 hour(s)).  RADIOLOGY STUDIES/RESULTS: Ct Head Wo Contrast  10/02/2014   CLINICAL DATA:  64 year old male with right-sided weakness. History of prior cerebral vascular accident with left-sided deficits.  EXAM: CT HEAD WITHOUT CONTRAST  TECHNIQUE: Contiguous axial images were obtained from the base of the skull through the vertex without intravenous contrast.  COMPARISON:  Head CT 11/09/2013.  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 Mild cerebral atrophy. Patchy and confluent areas of decreased attenuation are noted throughout the deep and periventricular white matter of the cerebral hemispheres bilaterally, compatible with chronic microvascular ischemic disease. No acute intracranial abnormalities. Specifically, no evidence of acute intracranial hemorrhage, no definite findings of acute/subacute cerebral ischemia, no mass, mass effect, hydrocephalus or abnormal intra or extra-axial fluid collections. No acute displaced skull fractures are identified. Extensive mucosal thickening associated with the visualized terminates. Left maxillary sinus is nearly completely opacified.  IMPRESSION: 1. No acute intracranial abnormalities. 2. 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 3. Mild cerebral atrophy with extensive chronic microvascular ischemic changes in cerebral white matter redemonstrated. 4. Paranasal sinus disease, as above.   Electronically Signed   By: Vinnie Langton M.D.   On: 10/02/2014 17:27    Oren Binet, MD  [facility] Pager:[phone]  If 7PM-7AM, please contact night-coverage [email or web address] Password  [email or web address] 10/13/2014, 1:40 PM   LOS: 5 days

## 2014-10-13 NOTE — Clinical Social Work Note (Signed)
Clinical Social Worker presented bed offers to pt's wife, [name] which were [facility] and [facility]. Pt's wife expressed she is not pleased with offers and plans to contact others facilities such as [facility] and [facility]. Pt's wife has a strong desire for pt to be admitted into IP REHAB. CIR currently awaiting [employer] authorization. Pt will likely need SNF placement. RNCM notified.    CSW continues to follow pt and pt's family for continued support and to facilitate pt's discharge needs.  FL-2 on chart for MD signature.   Glendon Axe, MSW, LCSWA [phone] 10/13/2014 4:37 PM

## 2014-10-14 NOTE — Progress Notes (Signed)
Rehab admissions - I am following pt's case and left two messages with [facility] RN case managers. They have not returned my calls and [employer] is still processing pt's case for inpatient rehab.  I met with pt and his wife. Pt did not respond to me but his eyes were open. Wife stated, "he is so sleepy from these seizure medications." I explained that we are still waiting on insurance response but that if pt is medically ready for DC, then pt will need to be discharged to SNF. Wife expressed understanding of this and stated that [facility] is her preference if CIR is denied.  I spoke with Loma Sousa, case Freight forwarder and Eliezer Lofts, Education officer, museum about pt's case.  I will keep the pt/family and medical team updated as I wait to hear from [facility].  Please call me with any questions. Thanks.  Nanetta Batty, PT Rehabilitation Admissions Coordinator [phone]

## 2014-10-14 NOTE — Progress Notes (Signed)
PATIENT DETAILS Name: Hillard Goodwine Age: 64 y.o. Sex: male Date of Birth: 01-10-1951 Admit Date: 10/08/2014 Admitting Physician Kinnie Feil, MD JTT:[id number] CHARLES, MD  Subjective: No major issues overnight. Awaiting bed at CIR or at SNF  Assessment/Plan: Active Problems:   Acute CVA (cerebral infarction): admitted and underwent extensive workup (see discharge summary). Initially on aspirin, now on Plavix. Awaiting bed at [facility] or SNF. Social work on Mining engineer working with family     Hypertension: Continue with Bystolic     history of seizure disorder: Continue with Depakote, Dilantin and Vimpat  Disposition: Remain inpatient-to CIR or SNF when bed available   Antibiotics:  Anti-infectives    None     DVT Prophylaxis: Prophylactic Lovenox  Code Status: Full code   Family Communication None at bedside  Procedures:  None  CONSULTS:  neurology   MEDICATIONS: Scheduled Meds: . clopidogrel  75 mg Oral Daily  . divalproex  500 mg Oral TID  . enoxaparin (LOVENOX) injection  40 mg Subcutaneous Q24H  . lacosamide  125 mg Oral BID  . nebivolol  10 mg Oral Daily  . phenytoin  100 mg Oral BID   Continuous Infusions:  PRN Meds:.acetaminophen, hydrALAZINE, senna-docusate    PHYSICAL EXAM: Vital signs in last 24 hours: Filed Vitals:   10/13/14 2137 10/14/14 0145 10/14/14 0627 10/14/14 1017  BP: 144/75 132/80 125/73 108/91  Pulse: 91 88 86 84  Temp: 99.4 F (37.4 C) 99.3 F (37.4 C) 99.5 F (37.5 C) 98.9 F (37.2 C)  TempSrc: Oral Oral Oral Oral  Resp: 20 22 20 16   Height:      Weight:      SpO2: 97% 96% 99% 97%    Weight change:  Filed Weights   10/09/14 0700  Weight: 80.831 kg (178 lb 3.2 oz)   Body mass index is 25.57 kg/(m^2).   Gen Exam: Awake, non-fluent speech, follows some commands  Neck: Supple, No JVD.  Chest: B/L Clear.   CVS: S1 S2 Regular, no murmurs.  Abdomen: soft, BS +, non tender, non distended.   Intake/Output from  previous day:  Intake/Output Summary (Last 24 hours) at 10/14/14 1103 Last data filed at 10/14/14 1023  Gross per 24 hour  Intake    360 ml  Output      0 ml  Net    360 ml     LAB RESULTS: CBC  Recent Labs Lab 10/08/14 1459 10/08/14 1525 10/10/14 0642 10/11/14 0720  WBC 6.3  --  4.8 7.6  HGB 15.4 17.0 14.2 15.8  HCT 47.5 50.0 43.9 48.6  PLT 201  --  172 202  MCV 91.9  --  90.9 90.7  MCH 29.8  --  29.4 29.5  MCHC 32.4  --  32.3 32.5  RDW 13.1  --  12.8 12.8  LYMPHSABS 1.2  --   --   --   MONOABS 0.9  --   --   --   EOSABS 0.2  --   --   --   BASOSABS 0.0  --   --   --     Chemistries   Recent Labs Lab 10/08/14 1459 10/08/14 1525 10/10/14 0642 10/11/14 0720  NA 142 143 144 141  K 4.0 4.0 3.8 4.1  CL 103 103 108 106  CO2 31  --  34* 29  GLUCOSE 103* 97 94 105*  BUN 9 12 10 11   CREATININE 1.06 1.00 1.13 1.09  CALCIUM 8.9  --  8.4 8.7    CBG:  Recent Labs Lab 10/08/14 1656  GLUCAP 107*    GFR Estimated Creatinine Clearance: 72.6 mL/min (by C-G formula based on Cr of 1.09).  Coagulation profile  Recent Labs Lab 10/08/14 1459  INR 0.98    Cardiac Enzymes No results for input(s): CKMB, TROPONINI, MYOGLOBIN in the last 168 hours.  Invalid input(s): CK  Invalid input(s): POCBNP No results for input(s): DDIMER in the last 72 hours. No results for input(s): HGBA1C in the last 72 hours. No results for input(s): CHOL, HDL, LDLCALC, TRIG, CHOLHDL, LDLDIRECT in the last 72 hours. No results for input(s): TSH, T4TOTAL, T3FREE, THYROIDAB in the last 72 hours.  Invalid input(s): FREET3 No results for input(s): VITAMINB12, FOLATE, FERRITIN, TIBC, IRON, RETICCTPCT in the last 72 hours. No results for input(s): LIPASE, AMYLASE in the last 72 hours.  Urine Studies No results for input(s): UHGB, CRYS in the last 72 hours.  Invalid input(s): UACOL, UAPR, USPG, UPH, UTP, UGL, UKET, UBIL, UNIT, UROB, ULEU, UEPI, UWBC, URBC, UBAC, CAST, UCOM,  BILUA  MICROBIOLOGY: No results found for this or any previous visit (from the past 240 hour(s)).  RADIOLOGY STUDIES/RESULTS: Ct Head Wo Contrast  10/02/2014   CLINICAL DATA:  64 year old male with right-sided weakness. History of prior cerebral vascular accident with left-sided deficits.  EXAM: CT HEAD WITHOUT CONTRAST  TECHNIQUE: Contiguous axial images were obtained from the base of the skull through the vertex without intravenous contrast.  COMPARISON:  Head CT 11/09/2013.  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 Mild cerebral atrophy. Patchy and confluent areas of decreased attenuation are noted throughout the deep and periventricular white matter of the cerebral hemispheres bilaterally, compatible with chronic microvascular ischemic disease. No acute intracranial abnormalities. Specifically, no evidence of acute intracranial hemorrhage, no definite findings of acute/subacute cerebral ischemia, no mass, mass effect, hydrocephalus or abnormal intra or extra-axial fluid collections. No acute displaced skull fractures are identified. Extensive mucosal thickening associated with the visualized terminates. Left maxillary sinus is nearly completely opacified.  IMPRESSION: 1. No acute intracranial abnormalities. 2. 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 3. Mild cerebral atrophy with extensive chronic microvascular ischemic changes in cerebral white matter redemonstrated. 4. Paranasal sinus disease, as above.   Electronically Signed   By: Vinnie Langton M.D.   On: 10/02/2014 17:27    Oren Binet, MD  [facility] Pager:[phone]  If 7PM-7AM, please contact night-coverage [email or web address] Password  [email or web address] 10/14/2014, 11:03 AM   LOS: 6 days

## 2014-10-14 NOTE — Clinical Social Work Note (Signed)
CSW received phone call from patient wife this morning who was displeased with bed offers- CSW faxed additional information to pt wife's first choice- [facility]- they are able to make a bed offer for the patient pending insurance authorization.  CSW will continue to follow.  Domenica Reamer, [place] Social Worker [phone]

## 2014-10-15 ENCOUNTER — Other Ambulatory Visit: Payer: Self-pay | Admitting: *Deleted

## 2014-10-15 ENCOUNTER — Encounter (HOSPITAL_COMMUNITY): Payer: Self-pay | Admitting: *Deleted

## 2014-10-15 LAB — COMPREHENSIVE METABOLIC PANEL
ALBUMIN: 2.6 g/dL — AB (ref 3.5–5.2)
ALK PHOS: 44 U/L (ref 39–117)
ALT: 16 U/L (ref 0–53)
AST: 17 U/L (ref 0–37)
Anion gap: 4 — ABNORMAL LOW (ref 5–15)
BILIRUBIN TOTAL: 0.6 mg/dL (ref 0.3–1.2)
BUN: 19 mg/dL (ref 6–23)
CHLORIDE: 108 mmol/L (ref 96–112)
CO2: 31 mmol/L (ref 19–32)
CREATININE: 1.12 mg/dL (ref 0.50–1.35)
Calcium: 8.5 mg/dL (ref 8.4–10.5)
GFR calc non Af Amer: 69 mL/min — ABNORMAL LOW (ref >90)
GFR, EST AFRICAN AMERICAN: 80 mL/min — AB (ref >90)
Glucose, Bld: 99 mg/dL (ref 70–99)
POTASSIUM: 4.1 mmol/L (ref 3.5–5.1)
Sodium: 143 mmol/L (ref 135–145)
Total Protein: 5.5 g/dL — ABNORMAL LOW (ref 6.0–8.3)

## 2014-10-15 LAB — CBC WITH DIFFERENTIAL/PLATELET
Basophils Absolute: 0 10*3/uL (ref 0.0–0.1)
Basophils Relative: 0 % (ref 0–1)
EOS PCT: 1 % (ref 0–5)
Eosinophils Absolute: 0.1 10*3/uL (ref 0.0–0.7)
HCT: 44.5 % (ref 39.0–52.0)
HEMOGLOBIN: 14.3 g/dL (ref 13.0–17.0)
Lymphocytes Relative: 20 % (ref 12–46)
Lymphs Abs: 1.2 10*3/uL (ref 0.7–4.0)
MCH: 29.4 pg (ref 26.0–34.0)
MCHC: 32.1 g/dL (ref 30.0–36.0)
MCV: 91.4 fL (ref 78.0–100.0)
Monocytes Absolute: 1.1 10*3/uL — ABNORMAL HIGH (ref 0.1–1.0)
Monocytes Relative: 19 % — ABNORMAL HIGH (ref 3–12)
NEUTROS PCT: 60 % (ref 43–77)
Neutro Abs: 3.6 10*3/uL (ref 1.7–7.7)
Platelets: 199 10*3/uL (ref 150–400)
RBC: 4.87 MIL/uL (ref 4.22–5.81)
RDW: 12.8 % (ref 11.5–15.5)
WBC: 6 10*3/uL (ref 4.0–10.5)

## 2014-10-15 MED ORDER — ONDANSETRON HCL 4 MG/2ML IJ SOLN: 4.0000 mg | Freq: Four times a day (QID) | INTRAMUSCULAR | Status: DC | PRN

## 2014-10-15 MED ORDER — ENOXAPARIN SODIUM 40 MG/0.4ML ~~LOC~~ SOLN: 40.0000 mg | SUBCUTANEOUS | Status: DC

## 2014-10-15 MED ORDER — LACOSAMIDE 50 MG PO TABS
125.0000 mg | ORAL_TABLET | Freq: Two times a day (BID) | ORAL | Status: DC
  Administered 2014-10-15: 125 mg via ORAL
  Filled 2014-10-15: qty 3

## 2014-10-15 MED ORDER — SORBITOL 70 % SOLN
30.0000 mL | Freq: Every day | Status: DC | PRN
  Filled 2014-10-15: qty 30

## 2014-10-15 MED ORDER — PHENYTOIN 50 MG PO CHEW
100.0000 mg | CHEWABLE_TABLET | Freq: Two times a day (BID) | ORAL | Status: DC
  Administered 2014-10-15: 100 mg via ORAL
  Filled 2014-10-15 (×2): qty 2

## 2014-10-15 MED ORDER — ONDANSETRON HCL 4 MG PO TABS: 4.0000 mg | ORAL_TABLET | Freq: Four times a day (QID) | ORAL | Status: DC | PRN

## 2014-10-15 MED ORDER — SENNOSIDES-DOCUSATE SODIUM 8.6-50 MG PO TABS: 1.0000 | ORAL_TABLET | Freq: Every evening | ORAL | Status: DC | PRN

## 2014-10-15 MED ORDER — LACOSAMIDE 10 MG/ML PO SOLN
125.0000 mg | Freq: Two times a day (BID) | ORAL | Status: AC
Start: 2014-10-15 — End: ?

## 2014-10-15 MED ORDER — CLOPIDOGREL BISULFATE 75 MG PO TABS
75.0000 mg | ORAL_TABLET | Freq: Every day | ORAL | Status: DC
  Administered 2014-10-15: 75 mg via ORAL
  Filled 2014-10-15: qty 1

## 2014-10-15 MED ORDER — ACETAMINOPHEN 325 MG PO TABS: 650.0000 mg | ORAL_TABLET | Freq: Four times a day (QID) | ORAL | Status: DC | PRN

## 2014-10-15 MED ORDER — NEBIVOLOL HCL 10 MG PO TABS
10.0000 mg | ORAL_TABLET | Freq: Every day | ORAL | Status: DC
  Administered 2014-10-15: 10 mg via ORAL
  Filled 2014-10-15: qty 1

## 2014-10-15 MED ORDER — DIVALPROEX SODIUM 125 MG PO CPSP
500.0000 mg | ORAL_CAPSULE | Freq: Three times a day (TID) | ORAL | Status: DC
  Administered 2014-10-15: 500 mg via ORAL
  Filled 2014-10-15: qty 4

## 2014-10-15 NOTE — Progress Notes (Signed)
PATIENT DETAILS Name: Craig [name] Age: 64 y.o. Sex: male Date of Birth: [birth year] Admit Date: 10/08/2014 Admitting Physician Kinnie Feil, MD XIP:[id number] CHARLES, MD  Subjective: No major issues overnight. Awaiting bed at CIR or at SNF  Assessment/Plan: Active Problems:   Acute CVA (cerebral infarction): admitted and underwent extensive workup (see discharge summary). Initially on aspirin, now on Plavix. Initially planned for discharge to CIR, but now plans are for SNF. Discharge summary by Dr Tat 2/9 still uptodate. Stable for discharge.       Hypertension: Continue with Bystolic     history of seizure disorder: Continue with Depakote, Dilantin and Vimpat  Disposition: Remain inpatient-SNF today  Antibiotics:  Anti-infectives    None     DVT Prophylaxis: Prophylactic Lovenox  Code Status: Full code   Family Communication None at bedside  Procedures:  None  CONSULTS:  neurology   MEDICATIONS: Scheduled Meds: . clopidogrel  75 mg Oral Daily  . divalproex  500 mg Oral TID  . enoxaparin (LOVENOX) injection  40 mg Subcutaneous Q24H  . lacosamide  125 mg Oral BID  . nebivolol  10 mg Oral Daily  . phenytoin  100 mg Oral BID   Continuous Infusions:  PRN Meds:.acetaminophen, hydrALAZINE, ondansetron **OR** ondansetron (ZOFRAN) IV, senna-docusate, sorbitol    PHYSICAL EXAM: Vital signs in last 24 hours: Filed Vitals:   10/14/14 2147 10/15/14 0232 10/15/14 0655 10/15/14 0700  BP: 159/91 144/92  129/79  Pulse: 86 79  72  Temp: 100.2 F (37.9 C) 98.8 F (37.1 C)  98.9 F (37.2 C)  TempSrc: Oral Oral  Oral  Resp: 18 18  20   Height:      Weight:   79.924 kg (176 lb 3.2 oz)   SpO2: 100% 99%  100%    Weight change:  Filed Weights   10/09/14 0700 10/15/14 0655  Weight: 80.831 kg (178 lb 3.2 oz) 79.924 kg (176 lb 3.2 oz)   Body mass index is 25.28 kg/(m^2).   Gen Exam: Awake, follows some commands  Neck: Supple, No JVD.  Chest: B/L  Clear.   CVS: S1 S2 Regular, no murmurs.  Abdomen: soft, BS +, non tender, non distended.   Intake/Output from previous day:  Intake/Output Summary (Last 24 hours) at 10/15/14 0847 Last data filed at 10/14/14 1023  Gross per 24 hour  Intake    360 ml  Output      0 ml  Net    360 ml     LAB RESULTS: CBC  Recent Labs Lab 10/08/14 1459 10/08/14 1525 10/10/14 0642 10/11/14 0720 10/15/14 0820  WBC 6.3  --  4.8 7.6 6.0  HGB 15.4 17.0 14.2 15.8 14.3  HCT 47.5 50.0 43.9 48.6 44.5  PLT 201  --  172 202 199  MCV 91.9  --  90.9 90.7 91.4  MCH 29.8  --  29.4 29.5 29.4  MCHC 32.4  --  32.3 32.5 32.1  RDW 13.1  --  12.8 12.8 12.8  LYMPHSABS 1.2  --   --   --  1.2  MONOABS 0.9  --   --   --  1.1*  EOSABS 0.2  --   --   --  0.1  BASOSABS 0.0  --   --   --  0.0    Chemistries   Recent Labs Lab 10/08/14 1459 10/08/14 1525 10/10/14 0642 10/11/14 0720  NA 142 143 144 141  K 4.0 4.0  3.8 4.1  CL 103 103 108 106  CO2 31  --  34* 29  GLUCOSE 103* 97 94 105*  BUN 9 12 10 11   CREATININE 1.06 1.00 1.13 1.09  CALCIUM 8.9  --  8.4 8.7    CBG:  Recent Labs Lab 10/08/14 1656  GLUCAP 107*    GFR Estimated Creatinine Clearance: 72.6 mL/min (by C-G formula based on Cr of 1.09).  Coagulation profile  Recent Labs Lab 10/08/14 1459  INR 0.98    Cardiac Enzymes No results for input(s): CKMB, TROPONINI, MYOGLOBIN in the last 168 hours.  Invalid input(s): CK  Invalid input(s): POCBNP No results for input(s): DDIMER in the last 72 hours. No results for input(s): HGBA1C in the last 72 hours. No results for input(s): CHOL, HDL, LDLCALC, TRIG, CHOLHDL, LDLDIRECT in the last 72 hours. No results for input(s): TSH, T4TOTAL, T3FREE, THYROIDAB in the last 72 hours.  Invalid input(s): FREET3 No results for input(s): VITAMINB12, FOLATE, FERRITIN, TIBC, IRON, RETICCTPCT in the last 72 hours. No results for input(s): LIPASE, AMYLASE in the last 72 hours.  Urine Studies No  results for input(s): UHGB, CRYS in the last 72 hours.  Invalid input(s): UACOL, UAPR, USPG, UPH, UTP, UGL, UKET, UBIL, UNIT, UROB, ULEU, UEPI, UWBC, URBC, UBAC, CAST, UCOM, BILUA  MICROBIOLOGY: No results found for this or any previous visit (from the past 240 hour(s)).  RADIOLOGY STUDIES/RESULTS: Ct Head Wo Contrast  10/02/2014   CLINICAL DATA:  64 year old male with right-sided weakness. History of prior cerebral vascular accident with left-sided deficits.  EXAM: CT HEAD WITHOUT CONTRAST  TECHNIQUE: Contiguous axial images were obtained from the base of the skull through the vertex without intravenous contrast.  COMPARISON:  Head CT 11/09/2013.  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 Mild cerebral atrophy. Patchy and confluent areas of decreased attenuation are noted throughout the deep and periventricular white matter of the cerebral hemispheres bilaterally, compatible with chronic microvascular ischemic disease. No acute intracranial abnormalities. Specifically, no evidence of acute intracranial hemorrhage, no definite findings of acute/subacute cerebral ischemia, no mass, mass effect, hydrocephalus or abnormal intra or extra-axial fluid collections. No acute displaced skull fractures are identified. Extensive mucosal thickening associated with the visualized terminates. Left maxillary sinus is nearly completely opacified.  IMPRESSION: 1. No acute intracranial abnormalities. 2. 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 3. Mild cerebral atrophy with extensive chronic microvascular ischemic changes in cerebral white matter redemonstrated. 4. Paranasal sinus disease, as above.   Electronically Signed   By: Vinnie Langton M.D.   On: 10/02/2014 17:27    Oren Binet, MD  [facility] Pager:[phone]  If 7PM-7AM, please contact night-coverage [email or web address] Password [facility] 10/15/2014, 8:47 AM   LOS: 7 days

## 2014-10-15 NOTE — Progress Notes (Signed)
Rehab admissions - I received insurance denial from [facility] for inpatient rehab. I called and updated pt's wife and explained that social worker would now follow up with DC plans for SNF. Per Eliezer Lofts, pt was accepted at [facility].  I will now sign off pt's case and have updated [place], Education officer, museum and Stage manager, Tourist information centre manager.   Thanks.  Nanetta Batty, PT Rehabilitation Admissions Coordinator [phone]

## 2014-10-15 NOTE — Progress Notes (Signed)
PT Cancellation Note  Patient Details Name: Craig [name] MRN: [id number] DOB: December 31, 1950   Cancelled Treatment:     Spoke with social worker who states pt is going to be transported to SNF within the hour.  Will hold off on PT treatment and check back later.Santiago Glad L. Tamala Julian, [place] Pager [phone] 10/15/2014    Krystena Reitter LUBECK 10/15/2014, 10:56 AM

## 2014-10-15 NOTE — Telephone Encounter (Signed)
[facility] 

## 2014-10-15 NOTE — Clinical Social Work Note (Signed)
Patient will discharge to [facility] Anticipated discharge date:10/15/14 Family notified:wife at bedside Transportation by [facility]- scheduled for 11am  CSW signing off.  Domenica Reamer, [place] Social Worker [phone]

## 2014-10-15 NOTE — Progress Notes (Signed)
Discharge orders received. Transport called by SW.  VSS upon discharge.  Transported by [facility] via stretcher.  Attempted x2 to call report to [facility], floor nurse and nursing supervisor.  Message left for Nursing supervisor to return call.   Cori Razor, RN

## 2014-10-15 NOTE — Clinical Social Work Note (Signed)
[facility] received insurance authorization this morning- patient is able to DC to [facility] [place] [facility] today 10/15/14  CSW will continue to follow.  Domenica Reamer, [place] Social Worker [phone]

## 2014-10-18 ENCOUNTER — Telehealth: Payer: Self-pay

## 2014-10-18 NOTE — Telephone Encounter (Signed)
I'm okay with what they're doing

## 2014-10-18 NOTE — Telephone Encounter (Signed)
Dr.Lalonde [name] called she wanted you to know DX: right side stroke Craig [name] is at [facility]  X 1 month they have changed two of his med's first was asa 81 mg to Plavix the other they said to stop amlodipine she doesn't know why and wanted to make sure that was okay with you please advise because she wants a call back

## 2014-10-19 ENCOUNTER — Non-Acute Institutional Stay (SKILLED_NURSING_FACILITY): Payer: Medicare PPO | Admitting: Adult Health

## 2014-10-19 DIAGNOSIS — I63239 Cerebral infarction due to unspecified occlusion or stenosis of unspecified carotid arteries: Secondary | ICD-10-CM

## 2014-10-19 DIAGNOSIS — I639 Cerebral infarction, unspecified: Secondary | ICD-10-CM

## 2014-10-19 DIAGNOSIS — G40209 Localization-related (focal) (partial) symptomatic epilepsy and epileptic syndromes with complex partial seizures, not intractable, without status epilepticus: Secondary | ICD-10-CM

## 2014-10-19 DIAGNOSIS — I10 Essential (primary) hypertension: Secondary | ICD-10-CM

## 2014-10-19 NOTE — Telephone Encounter (Signed)
Mrs.[place] was informed

## 2014-10-22 ENCOUNTER — Non-Acute Institutional Stay (SKILLED_NURSING_FACILITY): Payer: Medicare PPO | Admitting: Internal Medicine

## 2014-10-22 DIAGNOSIS — I639 Cerebral infarction, unspecified: Secondary | ICD-10-CM

## 2014-10-22 DIAGNOSIS — R569 Unspecified convulsions: Secondary | ICD-10-CM

## 2014-10-22 DIAGNOSIS — I10 Essential (primary) hypertension: Secondary | ICD-10-CM

## 2014-10-22 DIAGNOSIS — G9340 Encephalopathy, unspecified: Secondary | ICD-10-CM | POA: Diagnosis not present

## 2014-10-22 DIAGNOSIS — R5381 Other malaise: Secondary | ICD-10-CM | POA: Diagnosis not present

## 2014-10-22 NOTE — Progress Notes (Signed)
Patient ID: Craig [name], male   DOB: Nov 20, 1950, 64 y.o.   MRN: [id number]    [facility]  Chief Complaint  Patient presents with  . New Admit To SNF   No Known Allergies  Code status: full code  HPI 64 y/o male patient is here for STR post hospital admission from 10/08/14-10/12/14 and inpatient rehabilitation with acute CVA with right sided hemiparesis. There are concerns of thromboembolic etiology from afib. TEE ruled out thrombi. He then had loop recorder implanted. He has history of CVA with residual left-sided weakness, HTN and dementia He is seen in his room today. He is unable to participate in HPI or ROS. He is being fed at bedside. As per staff, had bowel movement last night. No skin concerns. He has not been participating in therapy.   Review of Systems  Unable to obtain    Past Medical History  Diagnosis Date  . Hypertension   . Allergy     RHINITIS  . Diverticulosis   . Colonic polyp   . 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 Seizures   . Dementia    Past Surgical History  Procedure Laterality Date  . Loop recorder implant N/A 10/11/2014    MDT LINQ implanted by Dr Rayann Heman for cryptogenic stroke  . Tee without cardioversion N/A 10/11/2014    Procedure: TRANSESOPHAGEAL ECHOCARDIOGRAM (TEE);  Surgeon: Thayer Headings, MD;  Location: [facility];  Service: Cardiovascular;  Laterality: N/A;   Medication reviewed. See MAR  History   Social History  . Marital Status: Married    Spouse Name: [name]  . Number of Children: 1  . Years of Education: college   Occupational History  . retired [employer]   Social History Main Topics  . Smoking status: Never Smoker   . Smokeless tobacco: Never Used  . Alcohol Use: No  . Drug Use: No  . Sexual Activity: Yes   Other Topics Concern  . Not on file   Social History Narrative   Patient lives at home with his wife  has 2 years of college and 1  child.    Patient works at  [employer].  Patient denies smoking tobacco,drinking alcohol and taking all illict drugs.    Patient is a caffeine drinker.  Patient is right handed.   Family History  Problem Relation Age of Onset  . Cancer Mother   . Arthritis Mother   . Heart disease Mother   . Prostate cancer Father   . Cancer Sister   . Colon cancer Neg Hx   . Rectal cancer Neg Hx   . Stomach cancer Neg Hx   . Stroke Brother     massive stroke     Physical exam BP 144/71 mmHg  Pulse 76  Temp(Src) 97.3 F (36.3 C)  Resp 18  SpO2 99%  General- elderly male frail, ill appearing, in no acute distress Head- atraumatic, normocephalic Eyes- no pallor, no icterus Neck- no lymphadenopathy Cardiovascular- normal s1,s2, no murmurs, no leg edema Respiratory- bilateral clear to auscultation, no wheeze, no rhonchi, no crackles Abdomen- bowel sounds present, soft, non tender Musculoskeletal- not following commands, under total care Neurological- unable to assess  Labs reviewed  Assessment/plan  Physical deconditioning Will have him work with physical therapy and occupational therapy team to help with gait training and muscle strengthening exercises.fall precautions. Skin care. Encourage to be out of bed.   Acute encephalopathy His recent cva and  seizure could be contributing to this. Concern of depression. Psych consult. Check cbc with diff, cmp and dilantin level  Acute CVA continue Plavix 75 mg daily,  Has f/u with neurology. To provide assistance with ADLs, skin care and fall precautions  HTN bp controlled. Continue avapro 150 mg daily and bystolic 10 mg daily  Seizure  continue Depakote 500 mg tid, Dilantin 100 mg bid and Vimpat 125 mg bid  Family/ staff Communication: reviewed care plan with patient and nursing supervisor  Goals of care: short term rehabilitation  Blanchie Serve, MD  [facility] [phone] (Monday-Friday 8 am - 5 pm) [phone]  (afterhours)

## 2014-10-25 ENCOUNTER — Other Ambulatory Visit: Payer: Self-pay | Admitting: Neurology

## 2014-10-27 ENCOUNTER — Ambulatory Visit (INDEPENDENT_AMBULATORY_CARE_PROVIDER_SITE_OTHER): Payer: Medicare HMO | Admitting: *Deleted

## 2014-10-27 DIAGNOSIS — I639 Cerebral infarction, unspecified: Secondary | ICD-10-CM

## 2014-10-27 LAB — MDC_IDC_ENUM_SESS_TYPE_INCLINIC
Date Time Interrogation Session: 20160224100411
MDC IDC SET ZONE DETECTION INTERVAL: 2000 ms
MDC IDC SET ZONE DETECTION INTERVAL: 360 ms
Zone Setting Detection Interval: 3000 ms

## 2014-10-27 NOTE — Progress Notes (Signed)
Wound check appointment. Steri-strips removed prior to arrival. Wound without redness or edema. Incision edges approximated, wound well healed. Normal device function. Pt with 0 tachy episodes; 0 brady episodes; 0 asystole. Carelink summary reports qmo. ROV w/ Dr. Rayann Heman PRN.

## 2014-11-03 ENCOUNTER — Other Ambulatory Visit: Payer: Self-pay | Admitting: *Deleted

## 2014-11-03 ENCOUNTER — Telehealth: Payer: Self-pay | Admitting: Family Medicine

## 2014-11-03 ENCOUNTER — Ambulatory Visit (INDEPENDENT_AMBULATORY_CARE_PROVIDER_SITE_OTHER): Payer: Medicare PPO | Admitting: Neurology

## 2014-11-03 ENCOUNTER — Encounter: Payer: Self-pay | Admitting: Neurology

## 2014-11-03 DIAGNOSIS — R569 Unspecified convulsions: Secondary | ICD-10-CM

## 2014-11-03 DIAGNOSIS — I10 Essential (primary) hypertension: Secondary | ICD-10-CM | POA: Diagnosis not present

## 2014-11-03 MED ORDER — AMLODIPINE BESYLATE 5 MG PO TABS: 5.0000 mg | ORAL_TABLET | Freq: Every day | ORAL | Status: DC

## 2014-11-03 NOTE — Telephone Encounter (Signed)
Wife called and states pt has had another stroke on opposite side.  His blood pressure was 223/90 left and 200/100 right at 3:10 p.m.today.  Dr. Willaim Rayas put him back on his blood pressure meds 5 mg.  [name] is very upset with the emergency room and Dr. Willaim Rayas.  [name] wants to know if you will refer her to another neurologist?  Pt ph [phone]

## 2014-11-03 NOTE — Patient Instructions (Signed)
I had a long discussion with the patient and his wife regarding his unfortunate recent stroke and significant post stroke neurological deficits with aphasia, dense hemiplegia and bed ridden state. Patient has not shown significant improvement or participation in therapies and is total care. Recommend ongoing therapies and add Norvasc 5 mg daily for hypertension as his blood pressure is significantly elevated in our office today. Consider baclofen 5 mg 3 times daily for left neck pain and dystonia but the patient's wife is reluctant to start this at the current time. Check lab work for any reversible etiology today. Continue current seizure medicines and the current dosages. Return for follow-up in 3 months or earlier if necessary.

## 2014-11-03 NOTE — Progress Notes (Signed)
PATIENT: Craig [name] DOB: [birth year]  REASON FOR VISIT: routine follow up for epilepsy, history of stroke HISTORY FROM: patient  HISTORY OF PRESENT ILLNESS: PRIOR HPI: 64 year old right-handed African American male here for two-month followup of right cerebral artery infarct and left cerebellar artery infarct secondary to occlusive intra-and extracranial disease with occlusion of the right posterior cerebral artery and occlusion of the left superior cerebellar artery compatible with areas of acute infarction on 12/07/2011. Vascular risk factors include hypertension, elevated hemoglobin A1c. He presented to [facility] after being found at home by family on the floor. He had awakened with dizziness and nausea, when going to the bathroom lost his balance and fell. He had numbness and weakness for 2 weeks prior and vision changes. MRA showed diffuse intracranial atherosclerotic disease. 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 Occlusion of the left superior cerebellar artery compatible with areas of acute infarction. 2 Vicodin carotid Doppler normal. LDL 75, hemoglobin A1c 6.4. He is now on aspirin 81 mg stopped clopidogrel recently. He continues with outpatient speech, occupational and physical therapy, doing well. Using a 3 prong cane at times and walker.  UPDATE 10/29/12 (PS): He is seen urgently today his family requested after having a seizure on 09/05/2012. He was seen at [facility] ER Dilantin level was low and he was given 1000 mg loading dose. He was given antibiotics for UTI. He called the office complaining of what he felt for seizure auras and was asked increased impact 300 mg daily from 10/21/2012 he has been having memory difficulty since then. He denies depression but tested positive on depression screen and score 28/30 on MMSE today. He is not have Dilantin level checked again. He denies dizziness, vertigo, sleepiness or gait ataxia.  UPDATE 03/24/13 (LL): Since last office visit, patient had  a witnessed seizure 02/22/13 that occurred prior to arrival to ER. Patient's wife witnessed a seizure that lasted about 5 minutes. She reports generalized jerking movements. She denies head trauma but does report the patient bit his tongue. No aggravating/allevaiting factors. No associated symptoms. He had another similar seizure on 12/17/12. He is currently taking Dilantin extended release 100 mg, one twice daily on Tuesday, Thursday, Saturday and Sunday. One capsule 3 times daily on Monday, Wednesday, and Friday. Also Vimpat 100 mg tablets, one in the morning, and 2 at night. Wife reports that his memory has been getting much worse over the course of the year. Sometimes now he even forgets that he has eaten 30 minutes after finishing. He looks to his wife to answer for him when questioned about time, date, location. MMSE at last visit was 24/30 with deficits in orientation and, recall, handwriting. TSH, homocystine, vitamin B12, RPR were all checked at last visit and normal.  Update 06/08/2013 (PS): He returns for f/u after last visit 03/24/13 with Charlott Holler, NP. He had another seizure in setting of UTI on 05/11/13 and was seen in Er and asked to increase Vimpat dose to 100 mg am and 200 mg hs but wife did not do so due to fear of cognitive side effects as he is still having memory difficulties which began since starting Vimpat. He was started on depakote during last visit but has trouble swallowing the tablets and wife is requesting liquid form if possible.He had EEG done on 04/11/13 which showed focal right hemispheric slowing without definite epileptiform features.  UPDATE 08/17/13 (LL): He returns for f/u after last visit on 06/08/13 with Dr. Leonie Man. Wife reports progressive decline in  cognition and is slow to respond. He has been progressively weaker and having frequent falls. Wife is having increasing difficulty with his care; she works full time. If he falls, it is difficult to get him up. He is having increasing  difficulty assessing spatial relationships and judging distances. He has fallen 3 times recently. He has not had any more seizures since last visit.  UPDATE 12/04/13 : For followup of his last visit 3 months ago. Is accompanied by his wife who states that he had a single episode of seizure on 11/08/13 and was seen at [facility] emergency room. CT scan of the head showed multiple old infarcts without any acute abnormality. Lab work showed a valproic acid level of 54 and phenytoin level less than 2.5. CBC and basic chemistries unremarkable. The patient has had troubles chewing and swallowing his medications of late and hence had been switched to Depakote sprinkles and chewable Phenytoin tablets but he has not started that yet. He continues to take Vimpat in a liquid form. Patient's wife has not noticed any significant cognitive improvement after reducing the Vimpat dose from 3 times a day to twice a day. He continues to have memory difficulties and at times cannot remember family members. He has difficulty walking with a cane and now spends most of his time in a wheelchair as he tends to fall a lot. He has some intermittent agitation particularly stressed to swallow medications. He however has no trouble eating his food most of it is pured. The patient's wife has managed to arrange for some additional help at home and she has a CNA who comes for 4 hours a day and Fridays a week. She is struggling to care for him but yet wants to continue to do so as long as she can.   UPDATE 03/10/14 (LL): He returns for 3 month follow up with his wife.  He has not had any recurrent seizures since March.  His mood appears better since having a male caregiver in the home for 4 hours 5 days a week, it is a man he knew from their church. He is talkative today and appears happy.  Wife brings up him lower leg and foot swelling again, they are using compression hose at the advice of Dr. Redmond School.  The swelling is improved to me since I saw him  months ago. They are trying to elevate his legs as much as possible.  His blood pressure is well controlled, and he continues to slim down.  His wife states that for several months he did not want to eat but his appetite is better now. Last valproic acid level was 52 and Phenytoin was 7.  His wife states that he wakes 3-4 times each night and wants to sit on the side of the bed, although he has no problem going to sleep. He naps throughout the day, especially about an hour after taking his ae medications. Update 11/03/2014 : (PS) patient is seen today after recent hospital admission for stroke. He is unable to provide history which is up 10 from his wife and review of hospital chart. Patient presented to the office for worsening right-sided weakness and saw Dr. Boykin Peek who ordered an MRI which was done at an outside facility which have personally reviewed and showed subacute left parasagittal parietal infarct. Patient was subsequently hospitalized. He was initially taken to the emergency room on 09/3514 for an episode of staring off and being poorly unresponsive with right-sided weakness. He was seen in  the ED and a CT scan showed no acute abnormality. He was thought to have a petit mall seizure and Vimpat dose was increased. He was seen in the office by Dr. Lavell Anchors on 10/07/14 and an EEG was done the same day which showed diffuse background theta range slowing without any ongoing seizure activity. The patient was seen by me in the hospital and was found to be aphasic with significant right hemiplegia. Patient had previously had a cerebellar stroke as well as the right PCA infarct he underwent further stroke workup  For embolic source which included TEE which showed no evidence of cardiac source of embolism of PFO. He had a loop recorder implanted which so far has not shown atrial fibrillation. The patient's blood pressure in the nursing facility has been ranging in the 150-160 range and EMT recorded 160/100 today but  since he's been in office he has had 3 separate blood pressures which have ranged from 200/100 and above. Review of his medical chart show that he was on Norvasc 10 mg previously and nursing home but this medication was discontinued after recent discharge from the hospital with a stroke. Patient has been getting therapies in the nursing home but he has not been cooperating and responding to them and has not made much progress. He remains total care and total assist. His aspirin was changed to Plavix following the recent admission. He has had neck deviation to the left and pain when he straightened. I discussed the patient's wife treatment options including baclofen and possibly Botox this is not effective. Patient's wife appears to be quite disheartened with his lack of improvement and significant neurological deficits REVIEW OF SYSTEMS: Full 14 system review of systems performed and notable only for: Facial swelling, skin peeling, back pain, walking difficulty, neck pain, neck stiffness, speech difficulty and weakness.  ALLERGIES: No Known Allergies  HOME MEDICATIONS: Outpatient Prescriptions Prior to Visit  Medication Sig Dispense Refill  . acetaminophen (TYLENOL) 325 MG tablet Take 325 mg by mouth every 6 (six) hours as needed for moderate pain.    Marland Kitchen BYSTOLIC 10 MG tablet TAKE 1 TABLET BY MOUTH DAILY 30 tablet 11  . clopidogrel (PLAVIX) 75 MG tablet Take 1 tablet (75 mg total) by mouth daily. 30 tablet 0  . divalproex (DEPAKOTE SPRINKLE) 125 MG capsule Take 500 mg by mouth 3 (three) times daily.     . irbesartan (AVAPRO) 150 MG tablet TAKE 1 TABLET BY MOUTH DAILY (Patient taking differently: TAKE 1 TABLET BY MOUTH EVERY MORNING) 30 tablet 11  . Lacosamide (VIMPAT) 10 MG/ML SOLN Take 12.5 mLs (125 mg total) by mouth 2 (two) times daily. For seizure 600 mL 5  . phenytoin (DILANTIN) 50 MG tablet Chew 100 mg by mouth 2 (two) times daily. Chew tablet     No facility-administered medications prior to  visit.    PHYSICAL EXAM Filed Vitals:   Cannot calculate BMI with a height equal to zero.  Generalized: In no acute distress, pleasant African American male.  Neck: Supple, no carotid bruits  Cardiac: Regular rate rhythm, no murmur, Both lower extremities show 2+ pitting edema. Homan's sign is negative.   Pulmonary: Clear to auscultation bilaterally  Musculoskeletal: Left foot drop   Neurological examination  Mentation: Alert globally aphasic but will follow few simple midline and some commands on the right like making a fist and showing me 2 fingers. Head deviated to the left with left gaze deviation. Able to look up and down but not able  to look to the right. Blinks to threat partially on the right and not on the left. Right lower facial weakness. Tongue midline. Dense right hemiplegia with decreased tone on the right. Moves left leg withdrawal to painful stimuli. Has good antigravity strength in the left upper extremity. Plantars both not elicitable.  : Deep tendon reflexes are 2+ symmetric except more brisk on left upper and lower extremity.  GAIT/STATION: not tested, on a stretcher.   ASSESSMENT: 64 year old right-handed African American male with right posterior cerebral artery infarct and left cerebellar artery infarct secondary to occlusive intra-and extracranial disease with occlusion of the right posterior cerebral artery and occlusion of the left superior cerebellar artery compatible with areas of acute infarction on 12/07/2011.New left pareital infarct probably Jan  30,2016 with possible seizure with now significant more deficits. Vascular risk factors include hypertension,,Hyperlipidimia and post stroke seizures. PLAN:  I had a long discussion with the patient and his wife regarding his unfortunate recent stroke and significant post stroke neurological deficits with aphasia, dense hemiplegia and bed ridden state. Patient has not shown significant improvement or participation in  therapies and is total care. Recommend ongoing therapies and add Norvasc 5 mg daily for hypertension as his blood pressure is significantly elevated in our office today. Consider baclofen 5 mg 3 times daily for left neck pain and dystonia but the patient's wife is reluctant to start this at the current time. Check lab work for any reversible etiology today. Continue current seizure medicines and the current dosages. Return for follow-up in 3 months or earlier if necessary.  Antony Contras, MD  11/03/2014, 10:02 PM [facility] [address] [place],  [address] [phone]  Note: This document was prepared with digital dictation and possible smart phrase technology. Any transcriptional errors that result from this process are unintentional.

## 2014-11-04 ENCOUNTER — Encounter: Payer: Self-pay | Admitting: Internal Medicine

## 2014-11-04 LAB — CBC WITH DIFFERENTIAL/PLATELET
BASOS: 0 %
Basophils Absolute: 0 10*3/uL (ref 0.0–0.2)
EOS: 1 %
Eosinophils Absolute: 0.1 10*3/uL (ref 0.0–0.4)
HCT: 44.3 % (ref 37.5–51.0)
Hemoglobin: 15.1 g/dL (ref 12.6–17.7)
IMMATURE GRANS (ABS): 0.1 10*3/uL (ref 0.0–0.1)
Immature Granulocytes: 1 %
Lymphocytes Absolute: 1 10*3/uL (ref 0.7–3.1)
Lymphs: 14 %
MCH: 29.2 pg (ref 26.6–33.0)
MCHC: 34.1 g/dL (ref 31.5–35.7)
MCV: 86 fL (ref 79–97)
MONOS ABS: 1 10*3/uL — AB (ref 0.1–0.9)
Monocytes: 14 %
NEUTROS ABS: 5.1 10*3/uL (ref 1.4–7.0)
Neutrophils Relative %: 70 %
PLATELETS: 295 10*3/uL (ref 150–379)
RBC: 5.18 x10E6/uL (ref 4.14–5.80)
RDW: 13.5 % (ref 12.3–15.4)
WBC: 7.2 10*3/uL (ref 3.4–10.8)

## 2014-11-04 LAB — BASIC METABOLIC PANEL
BUN/Creatinine Ratio: 19 (ref 10–22)
BUN: 16 mg/dL (ref 8–27)
CO2: 26 mmol/L (ref 18–29)
Calcium: 8.8 mg/dL (ref 8.6–10.2)
Chloride: 102 mmol/L (ref 97–108)
Creatinine, Ser: 0.83 mg/dL (ref 0.76–1.27)
GFR calc Af Amer: 109 mL/min/{1.73_m2} (ref >59)
GFR calc non Af Amer: 94 mL/min/{1.73_m2} (ref >59)
Glucose: 112 mg/dL — ABNORMAL HIGH (ref 65–99)
Potassium: 4.4 mmol/L (ref 3.5–5.2)
SODIUM: 141 mmol/L (ref 134–144)

## 2014-11-04 LAB — AMMONIA: Ammonia: 92 ug/dL (ref 27–102)

## 2014-11-04 LAB — VALPROIC ACID LEVEL: Valproic Acid Lvl: 71 ug/mL (ref 50–100)

## 2014-11-07 ENCOUNTER — Telehealth: Payer: Self-pay | Admitting: Cardiology

## 2014-11-07 NOTE — Telephone Encounter (Signed)
Wife called concerned about site for loop recorder placed one month ago, she stated it appeared yellow and was worried.  He has no fever.  I explained that should not be a problem, but to have MD at [facility] look at it tomorrow.  She stated the incision was healed.

## 2014-11-10 ENCOUNTER — Telehealth: Payer: Self-pay | Admitting: Internal Medicine

## 2014-11-10 ENCOUNTER — Ambulatory Visit (INDEPENDENT_AMBULATORY_CARE_PROVIDER_SITE_OTHER): Payer: Medicare HMO | Admitting: *Deleted

## 2014-11-10 DIAGNOSIS — I639 Cerebral infarction, unspecified: Secondary | ICD-10-CM

## 2014-11-10 NOTE — Telephone Encounter (Signed)
Follow up       1. Has your device fired? No   2. Is you device beeping? No   3. Are you experiencing draining or swelling at device site? Healing bruise around loop site . Wife is stating  surgeon stating  cannot be a bruise.   4. Are you calling to see if we received your device transmission? No   5. Have you passed out? No

## 2014-11-10 NOTE — Telephone Encounter (Signed)
New message       1. Has your device fired? no 2. Is you device beeping? no 3. Are you experiencing draining or swelling at device site? no 4. Are you calling to see if we received your device transmission? no  5. Have you passed out? No Pt has a loop recorder.  The area over the loop recorder is yellow (no swelling or drainage).  Wife is concerned because pt will be discharged from [facility] next week.  Pt has had several strokes and is not able to give much info.  Please call

## 2014-11-11 ENCOUNTER — Telehealth: Payer: Self-pay | Admitting: Internal Medicine

## 2014-11-11 NOTE — Telephone Encounter (Signed)
Spoke w/pt's wife, [name] in regards to yellowish bruise at site. Per wife no redness, swelling, or fever at site. Wife instructed that it is probably bruise at site and could be due to Plavix. Pt is at [facility] at this time and will be discharged on 3-18. Wife concerned that this will not be a problem later on. Bruise showed up on 11-06-14. Pt was instructed to keep eye on site and any swelling, redness, drainage or fever at site begin, please call office.

## 2014-11-11 NOTE — Telephone Encounter (Signed)
New message      Talk to Erasmo Downer again

## 2014-11-11 NOTE — Telephone Encounter (Signed)
Wife called back to ask question about yellow area at site. Wife is concerned that it is infected. Pt was instructed appointment could be made for pt to come in for wound site check but wife declined. Pt is bed ridden and would need assistance getting pt here. Again, no swelling, redness, fever, or drainage from site. Wife was instructed if any changes from site.

## 2014-11-11 NOTE — Progress Notes (Signed)
Loop recorder 

## 2014-11-12 ENCOUNTER — Telehealth: Payer: Self-pay | Admitting: *Deleted

## 2014-11-12 DIAGNOSIS — Z0289 Encounter for other administrative examinations: Secondary | ICD-10-CM

## 2014-11-12 NOTE — Telephone Encounter (Signed)
Form,Fmla sent to [facility] and Dr Leonie Man 11-12-14 .

## 2014-11-16 ENCOUNTER — Encounter: Payer: Self-pay | Admitting: Adult Health

## 2014-11-16 ENCOUNTER — Telehealth: Payer: Self-pay | Admitting: Neurology

## 2014-11-16 ENCOUNTER — Non-Acute Institutional Stay (SKILLED_NURSING_FACILITY): Payer: Medicare PPO | Admitting: Adult Health

## 2014-11-16 DIAGNOSIS — F32A Depression, unspecified: Secondary | ICD-10-CM

## 2014-11-16 DIAGNOSIS — F329 Major depressive disorder, single episode, unspecified: Secondary | ICD-10-CM

## 2014-11-16 DIAGNOSIS — I10 Essential (primary) hypertension: Secondary | ICD-10-CM

## 2014-11-16 DIAGNOSIS — E43 Unspecified severe protein-calorie malnutrition: Secondary | ICD-10-CM

## 2014-11-16 DIAGNOSIS — I639 Cerebral infarction, unspecified: Secondary | ICD-10-CM

## 2014-11-16 DIAGNOSIS — G40209 Localization-related (focal) (partial) symptomatic epilepsy and epileptic syndromes with complex partial seizures, not intractable, without status epilepticus: Secondary | ICD-10-CM

## 2014-11-16 DIAGNOSIS — I63239 Cerebral infarction due to unspecified occlusion or stenosis of unspecified carotid arteries: Secondary | ICD-10-CM

## 2014-11-16 NOTE — Telephone Encounter (Signed)
Patient calling for blood work results.  Please call and advise.

## 2014-11-16 NOTE — Progress Notes (Addendum)
Patient ID: Marks Scalera, male   DOB: [birth year], 64 y.o.   MRN: [id number]   10/19/14  Facility:  Nursing Home Location:  [place] Room Number: 1203-P LEVEL OF CARE:  SNF (31)   Chief Complaint  Patient presents with  . Hospitalization Follow-up    Acute CVA, seizure and hypertension    HISTORY OF PRESENT ILLNESS:  This is a 64 year old male who has been admitted to [facility] on 10/15/14 from [facility]. He has PMH of hypertension, history of CVA with residual left sided weakness and dementia. He presented to the hospital with right sided hemiparesis resulting to inability to ambulate. He was recently started on Plavix due to a recent MRI showing small left posterior parietal infarct. Neurology suspect that it is a thromboembolic phenomenon related to paroxysmal atrial fibrillation. TEE done showed no thrombi. Loop recorder was implanted by Dr. Rayann Heman, cardiology.  He has been admitted for a short-term rehabilitation.  PAST MEDICAL HISTORY:  Past Medical History  Diagnosis Date  . Hypertension   . Allergy     RHINITIS  . Diverticulosis   . Colonic polyp   . 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 Seizures   . Dementia     CURRENT MEDICATIONS: Reviewed per MAR/see medication list  No Known Allergies   REVIEW OF SYSTEMS:  unable to obtain  PHYSICAL EXAMINATION  GENERAL: no acute distress EYES: conjunctivae normal, sclerae normal, normal eye lids NECK: supple, trachea midline, no neck masses, no thyroid tenderness, no thyromegaly LYMPHATICS: no LAN in the neck, no supraclavicular LAN RESPIRATORY: breathing is even & unlabored, BS CTAB CARDIAC: RRR, no murmur,no extra heart sounds, no edema GI: abdomen soft, normal BS, no masses, no tenderness, no hepatomegaly, no splenomegaly EXTREMITIES: R/L weakness PSYCHIATRIC: the patient is alert & oriented to person, affect & behavior  appropriate  LABS/RADIOLOGY: Labs reviewed: Basic Metabolic Panel: Recent Labs  10/11/14 0720 10/15/14 0820   NA 141 143   K 4.1 4.1   CL 106 108   CO2 29 31   GLUCOSE 105* 99   BUN 11 19   CREATININE 1.09 1.12   CALCIUM 8.7 8.5      Liver Function Tests:  Recent Labs  10/02/14 1711 10/08/14 1459 10/15/14 0820  AST 18 16 17   ALT 21 17 16   ALKPHOS 58 58 44  BILITOT 0.3 0.4 0.6  PROT 6.2 6.3 5.5*  ALBUMIN 3.4* 3.4* 2.6*    Recent Labs  11/03/14 1621  AMMONIA 92   CBC:  Recent Labs  10/08/14 1459  10/11/14 0720 10/15/14 0820   WBC 6.3  < > 7.6 6.0   NEUTROABS 4.1  --   --  3.6   HGB 15.4  < > 15.8 14.3   HCT 47.5  < > 48.6 44.5   MCV 91.9  < > 90.7 91.4   PLT 201  < > 202 199     Lipid Panel:  Recent Labs  10/09/14 0545  HDL 50   CBG:  Recent Labs  10/02/14 1724 10/08/14 1656  GLUCAP 93 107*    ASSESSMENT/PLAN:  CVA - follow-up with Neurology; continue Plavix 75 mg by mouth daily; for rehabilitation Seizure - continue Depakote 500 mg by mouth 3 times a day, Dilantin 100 mg by mouth twice a day and Vimpat 125 mg by mouth twice a day Hypertension - well controlled; continue Bystolic 10 mg by mouth daily and Avapro  150 mg by mouth daily    Goals of care:  Short-term rehabilitation   Labs/test ordered:  none     [facility], NP [place]

## 2014-11-16 NOTE — Progress Notes (Signed)
Patient ID: Craig [name], male   DOB: 1951/08/31, 64 y.o.   MRN: [id number]   11/16/14  Facility:  [facility] Home Location:  [facility] Room Number: 1203-P LEVEL OF CARE:  SNF (31)   Chief Complaint  Patient presents with  . Discharge Note    CVA, hypertension, protein calorie malnutrition, seizure and depression    HISTORY OF PRESENT ILLNESS:  This is a 64 year old male who is for discharge home with home health ST for swallowing and communication, PT for positioning and safety, OT for training with activities of daily leaving, home health aide for assistance with activities of daily living and nursing for disease management. DME: Harrel Lemon lift is needed as he is completely dependent in transfers and semi-electric hospital bed for positioning up head of bed 30 or more to prevent aspiration and caregiver transfers/care cannot be completed in a standard bed. He has been admitted to [facility] on 10/15/14 from [facility] He has PMH of hypertension, history of CVA with residual left sided weakness and dementia. He presented to the hospital with right sided hemiparesis resulting to inability to ambulate. He was recently started on Plavix due to a recent MRI showing small left posterior parietal infarct. Neurology suspect that it is a thromboembolic phenomenon related to paroxysmal atrial fibrillation. TEE done showed no thrombi. Loop recorder was implanted by Dr. Rayann Heman, cardiology.  Amlodipine was recently added for BP control. Celexa has been started but has been discontinued because wife requested for it to be on hold due to probable interaction with other medications. Dilantin has been increased to 200 mg BID and 50 mg @12  noon. He is for dilantin level in 1 week.  Patient was admitted to this facility for short-term rehabilitation after the patient's recent hospitalization.  Patient has completed SNF rehabilitation and therapy has cleared the patient for  discharge.  PAST MEDICAL HISTORY:  Past Medical History  Diagnosis Date  . Hypertension   . Allergy     RHINITIS  . Diverticulosis   . Colonic polyp   . 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 Seizures   . Dementia     CURRENT MEDICATIONS: Reviewed per MAR/see medication list  No Known Allergies   REVIEW OF SYSTEMS:  unable to obtain, patient is aphasic  PHYSICAL EXAMINATION  GENERAL: no acute distress NECK: supple, trachea midline, no neck masses, no thyroid tenderness, no thyromegaly LYMPHATICS: no LAN in the neck, no supraclavicular LAN RESPIRATORY: breathing is even & unlabored, BS CTAB CARDIAC: RRR, no murmur,no extra heart sounds, no edema GI: abdomen soft, normal BS, no masses, no tenderness, no hepatomegaly, no splenomegaly EXTREMITIES: R/L weakness PSYCHIATRIC: affect & behavior appropriate  LABS/RADIOLOGY: 11/10/14  phenytoin 7.9 WBC 7.6 hemoglobin 13.5 hematocrit 42.6 MCV 88.9 sodium 142 potassium 4.3 glucose 96 BUN 12 creatinine 0.74 SGOT 11 SGPT 9 alkaline phosphatase 67 calcium 8.7 albumin 3.0 total protein 5.3 11/08/14  WBC 2.4 hemoglobin 15.0 hematocrit 44.8 MCV 86.2 10/25/14  WBC 8.1 hemoglobin 13.7 hematocrit 41.8 MCV 87.6 sodium 141 potassium 4.6 glucose 92 BUN 17 creatinine 0.91 alkaline phosphatase 47 SGOT 10 SGPT 7  total protein 5.4 albumin 2.8 calcium 8.8  Phenytoin <2.5 Labs reviewed: Basic Metabolic Panel:  Recent Labs  10/11/14 0720 10/15/14 0820 11/03/14 1621  NA 141 143 141  K 4.1 4.1 4.4  CL 106 108 102  CO2 29 31 26   GLUCOSE 105* 99 112*  BUN 11 19 16  CREATININE 1.09 1.12 0.83  CALCIUM 8.7 8.5 8.8   Liver Function Tests:  Recent Labs  10/02/14 1711 10/08/14 1459 10/15/14 0820  AST 18 16 17   ALT 21 17 16   ALKPHOS 58 58 44  BILITOT 0.3 0.4 0.6  PROT 6.2 6.3 5.5*  ALBUMIN 3.4* 3.4* 2.6*    CBC:  Recent Labs  10/08/14 1459  10/11/14 0720 10/15/14 0820 11/03/14 1621  WBC 6.3  < > 7.6 6.0 7.2   NEUTROABS 4.1  --   --  3.6 5.1  HGB 15.4  < > 15.8 14.3 15.1  HCT 47.5  < > 48.6 44.5 44.3  MCV 91.9  < > 90.7 91.4 86  PLT 201  < > 202 199 295  < > = values in this interval not displayed.  Lipid Panel:  Recent Labs  10/09/14 0545  HDL 50   CBG:  Recent Labs  10/02/14 1724 10/08/14 1656  GLUCAP 93 107*    ASSESSMENT/PLAN:  CVA - follow-up with Neurology; continue Plavix 75 mg by mouth daily; for home health PT, OT, ST, nursing and home health aide Seizure - continue Depakote 500 mg by mouth 3 times a day, Dilantin 200 mg by mouth twice a day and 50 mg @ 12 noon and Vimpat 125 mg by mouth twice a day Hypertension - well controlled; continue Bystolic 10 mg by mouth daily, Amlodipine 5 mg PO Q D and Cozaar 75 mg PO Q D Protein calorie malnutrition, severe - continue supplementation Depression - Celexa was ordered but has to be discontinued per wife's request due to probable interactions with other medications   I have filled out patient's discharge paperwork and written prescriptions.  Patient will receive home health PT, OT, ST, Nursing and CNA.  DME provided:  Harrel Lemon lift and semi-electric hospital bed  Total discharge time: Greater than 30 minutes  Discharge time involved coordination of the discharge process with Education officer, museum, nursing staff and therapy department. Medical justification for home health services/DME verified.   [facility], NP [employer] [phone]

## 2014-11-17 NOTE — Telephone Encounter (Signed)
Spoke with patient's wife and informed her of  Lab results, wife questioned why Glucose was a little elevated and what should be done about this, instructed wife to call patient's PCP Dr Redmond School to handle his Glucose levels. She also requested lab results to be mailed to her, which are ready to be mailed. She also requested how much longer on forms, states that she will be bringing paitent home on Friday and will need this because she will have to take care of patient, informed her that forms are being worked on and will be completed as soon as possible

## 2014-11-19 NOTE — Telephone Encounter (Signed)
Form completed and placed on Dr Leonie Man desk for signature.

## 2014-11-21 DIAGNOSIS — E43 Unspecified severe protein-calorie malnutrition: Secondary | ICD-10-CM | POA: Diagnosis not present

## 2014-11-21 DIAGNOSIS — I69351 Hemiplegia and hemiparesis following cerebral infarction affecting right dominant side: Secondary | ICD-10-CM | POA: Diagnosis not present

## 2014-11-21 DIAGNOSIS — R531 Weakness: Secondary | ICD-10-CM | POA: Diagnosis not present

## 2014-11-21 DIAGNOSIS — I69320 Aphasia following cerebral infarction: Secondary | ICD-10-CM | POA: Diagnosis not present

## 2014-11-22 NOTE — Telephone Encounter (Signed)
Form,[employer] received completed by Dr Leonie Man and Charisse March at front desk for patient 11-22-14.

## 2014-11-24 LAB — MDC_IDC_ENUM_SESS_TYPE_REMOTE

## 2014-11-29 ENCOUNTER — Telehealth: Payer: Self-pay | Admitting: Neurology

## 2014-11-29 NOTE — Telephone Encounter (Signed)
Spouse called back and stated medication has been increased to 12.5mg .

## 2014-11-29 NOTE — Telephone Encounter (Signed)
Spouse is requesting the Rx for Vimpat be changed from liquid to tablets (she wants to crush them) because she feels the liquid is causing tooth decay.  I have spoken with the drug rep, and there have not been any studies regarding cutting or crushing Vimpat, therefore it is not certain if the efficacy will remain the same.  Tooth decay is not listed as a side effect of Vimpat.  As well, they indicate the dose has been increased since last seen at our office.  Would you like to change to tablets for them to crush?  Please advise. Thank you.

## 2014-11-29 NOTE — Telephone Encounter (Signed)
Spouse requesting Rx Lacosamide (VIMPAT) 10 MG/ML SOLN changed to pill form that could be crushed.  Liquid medication eating away at patient's teeth.  Spouse stated [facility] on [address] is holding liquid medication for two days and will put back. Please call and advise.

## 2014-11-30 ENCOUNTER — Telehealth: Payer: Self-pay | Admitting: Family Medicine

## 2014-11-30 NOTE — Telephone Encounter (Signed)
Aaron Edelman at [facility] t# [phone] called, needs order for Dilantin level order faxed to # [phone]

## 2014-11-30 NOTE — Telephone Encounter (Signed)
Craig [name] called and stated that Craig [name]'s last stroke has left him completely bed ridden. He will no longer be able to come here as a pt. It would require her to send an ambulance to come and get him for every visit. He will be switching care to a Dr. Fredderick Phenix at [address] [place] [address] [place], [place] [address]. This is a house call doc. Who can be reached at [phone] and fax number is [phone]. She wanted to thank you for all your care for [facility]. Craig [name] can be reached at [phone].

## 2014-12-01 ENCOUNTER — Emergency Department (HOSPITAL_COMMUNITY)
Admission: EM | Admit: 2014-12-01 | Discharge: 2014-12-01 | Disposition: A | Discharge status: Home / Self Care | Payer: Medicare PPO | Attending: Emergency Medicine | Admitting: Emergency Medicine

## 2014-12-01 ENCOUNTER — Emergency Department (HOSPITAL_COMMUNITY): Payer: Medicare PPO

## 2014-12-01 DIAGNOSIS — Z79899 Other long term (current) drug therapy: Secondary | ICD-10-CM | POA: Diagnosis not present

## 2014-12-01 DIAGNOSIS — R4182 Altered mental status, unspecified: Secondary | ICD-10-CM

## 2014-12-01 DIAGNOSIS — J0100 Acute maxillary sinusitis, unspecified: Secondary | ICD-10-CM | POA: Diagnosis not present

## 2014-12-01 DIAGNOSIS — I10 Essential (primary) hypertension: Secondary | ICD-10-CM | POA: Insufficient documentation

## 2014-12-01 DIAGNOSIS — F0390 Unspecified dementia without behavioral disturbance: Secondary | ICD-10-CM | POA: Diagnosis not present

## 2014-12-01 DIAGNOSIS — Z7902 Long term (current) use of antithrombotics/antiplatelets: Secondary | ICD-10-CM | POA: Insufficient documentation

## 2014-12-01 DIAGNOSIS — Z8719 Personal history of other diseases of the digestive system: Secondary | ICD-10-CM | POA: Diagnosis not present

## 2014-12-01 DIAGNOSIS — G40909 Epilepsy, unspecified, not intractable, without status epilepticus: Secondary | ICD-10-CM | POA: Diagnosis not present

## 2014-12-01 DIAGNOSIS — Z8673 Personal history of transient ischemic attack (TIA), and cerebral infarction without residual deficits: Secondary | ICD-10-CM | POA: Diagnosis not present

## 2014-12-01 DIAGNOSIS — E860 Dehydration: Secondary | ICD-10-CM | POA: Diagnosis not present

## 2014-12-01 DIAGNOSIS — Z86010 Personal history of colonic polyps: Secondary | ICD-10-CM | POA: Insufficient documentation

## 2014-12-01 LAB — URINALYSIS, ROUTINE W REFLEX MICROSCOPIC
BILIRUBIN URINE: NEGATIVE
GLUCOSE, UA: NEGATIVE mg/dL
Hgb urine dipstick: NEGATIVE
Ketones, ur: NEGATIVE mg/dL
LEUKOCYTES UA: NEGATIVE
NITRITE: NEGATIVE
PH: 6.5 (ref 5.0–8.0)
PROTEIN: NEGATIVE mg/dL
Specific Gravity, Urine: 1.02 (ref 1.005–1.030)
Urobilinogen, UA: 1 mg/dL (ref 0.0–1.0)

## 2014-12-01 LAB — COMPREHENSIVE METABOLIC PANEL
ALK PHOS: 100 U/L (ref 39–117)
ALT: 26 U/L (ref 0–53)
ANION GAP: 12 (ref 5–15)
AST: 26 U/L (ref 0–37)
Albumin: 3.3 g/dL — ABNORMAL LOW (ref 3.5–5.2)
BUN: 12 mg/dL (ref 6–23)
CO2: 28 mmol/L (ref 19–32)
Calcium: 9.7 mg/dL (ref 8.4–10.5)
Chloride: 104 mmol/L (ref 96–112)
Creatinine, Ser: 0.79 mg/dL (ref 0.50–1.35)
GFR calc non Af Amer: >90 mL/min (ref >90)
GLUCOSE: 95 mg/dL (ref 70–99)
POTASSIUM: 4.7 mmol/L (ref 3.5–5.1)
Sodium: 144 mmol/L (ref 135–145)
TOTAL PROTEIN: 7.6 g/dL (ref 6.0–8.3)
Total Bilirubin: 0.6 mg/dL (ref 0.3–1.2)

## 2014-12-01 LAB — CBC WITH DIFFERENTIAL/PLATELET
Basophils Absolute: 0 10*3/uL (ref 0.0–0.1)
Basophils Relative: 0 % (ref 0–1)
EOS ABS: 0 10*3/uL (ref 0.0–0.7)
Eosinophils Relative: 0 % (ref 0–5)
HCT: 53.5 % — ABNORMAL HIGH (ref 39.0–52.0)
Hemoglobin: 17.2 g/dL — ABNORMAL HIGH (ref 13.0–17.0)
Lymphocytes Relative: 11 % — ABNORMAL LOW (ref 12–46)
Lymphs Abs: 1 10*3/uL (ref 0.7–4.0)
MCH: 29.5 pg (ref 26.0–34.0)
MCHC: 32.1 g/dL (ref 30.0–36.0)
MCV: 91.8 fL (ref 78.0–100.0)
MONOS PCT: 10 % (ref 3–12)
Monocytes Absolute: 0.8 10*3/uL (ref 0.1–1.0)
NEUTROS ABS: 6.8 10*3/uL (ref 1.7–7.7)
Neutrophils Relative %: 79 % — ABNORMAL HIGH (ref 43–77)
Platelets: 264 10*3/uL (ref 150–400)
RBC: 5.83 MIL/uL — ABNORMAL HIGH (ref 4.22–5.81)
RDW: 13.1 % (ref 11.5–15.5)
WBC: 8.6 10*3/uL (ref 4.0–10.5)

## 2014-12-01 LAB — VALPROIC ACID LEVEL: Valproic Acid Lvl: 47.9 ug/mL — ABNORMAL LOW (ref 50.0–100.0)

## 2014-12-01 LAB — TROPONIN I

## 2014-12-01 LAB — I-STAT CG4 LACTIC ACID, ED
LACTIC ACID, VENOUS: 2.36 mmol/L — AB (ref 0.5–2.0)
Lactic Acid, Venous: 2.71 mmol/L (ref 0.5–2.0)

## 2014-12-01 LAB — PHENYTOIN LEVEL, TOTAL: Phenytoin Lvl: 19.6 ug/mL (ref 10.0–20.0)

## 2014-12-01 MED ORDER — SODIUM BICARBONATE 8.4 % IV SOLN: 50.0000 meq | Freq: Once | INTRAVENOUS | Status: DC

## 2014-12-01 MED ORDER — HYDROMORPHONE HCL 1 MG/ML IJ SOLN: 1.0000 mg | Freq: Once | INTRAMUSCULAR | Status: DC

## 2014-12-01 MED ORDER — ACETAMINOPHEN 325 MG PO TABS: 650.0000 mg | ORAL_TABLET | Freq: Once | ORAL | Status: DC

## 2014-12-01 MED ORDER — AMOXICILLIN-POT CLAVULANATE 875-125 MG PO TABS: 1.0000 | ORAL_TABLET | Freq: Two times a day (BID) | ORAL | Status: DC

## 2014-12-01 NOTE — ED Notes (Signed)
Pt to ED via [facility] from home, with wife stating pt mental status has diminished-- pt has hx CVA in April 2013 with left sided residual weakness,2nd CVA was January 2016, affecting right side and speech- nonverbal, but does normally respond by shaking head and will follow with eyes-- change this afternoon-- will not shake head to respond, right shoulder twitching -- has hx of seizures -- takes 3 seizure medicines/3 bp medicines-- normally able to take PO without any difficulty

## 2014-12-01 NOTE — ED Notes (Signed)
Pt. Left with all belongings 

## 2014-12-01 NOTE — Discharge Instructions (Signed)
Dehydration, Adult Dehydration is when you lose more fluids from the body than you take in. Vital organs like the kidneys, brain, and heart cannot function without a proper amount of fluids and salt. Any loss of fluids from the body can cause dehydration.  CAUSES   Vomiting.  Diarrhea.  Excessive sweating.  Excessive urine output.  Fever. SYMPTOMS  Mild dehydration  Thirst.  Dry lips.  Slightly dry mouth. Moderate dehydration  Very dry mouth.  Sunken eyes.  Skin does not bounce back quickly when lightly pinched and released.  Dark urine and decreased urine production.  Decreased tear production.  Headache. Severe dehydration  Very dry mouth.  Extreme thirst.  Rapid, weak pulse (more than 100 beats per minute at rest).  Cold hands and feet.  Not able to sweat in spite of heat and temperature.  Rapid breathing.  Blue lips.  Confusion and lethargy.  Difficulty being awakened.  Minimal urine production.  No tears. DIAGNOSIS  Your caregiver will diagnose dehydration based on your symptoms and your exam. Blood and urine tests will help confirm the diagnosis. The diagnostic evaluation should also identify the cause of dehydration. TREATMENT  Treatment of mild or moderate dehydration can often be done at home by increasing the amount of fluids that you drink. It is best to drink small amounts of fluid more often. Drinking too much at one time can make vomiting worse. Refer to the home care instructions below. Severe dehydration needs to be treated at the hospital where you will probably be given intravenous (IV) fluids that contain water and electrolytes. HOME CARE INSTRUCTIONS   Ask your caregiver about specific rehydration instructions.  Drink enough fluids to keep your urine clear or pale yellow.  Drink small amounts frequently if you have nausea and vomiting.  Eat as you normally do.  Avoid:  Foods or drinks high in sugar.  Carbonated  drinks.  Juice.  Extremely hot or cold fluids.  Drinks with caffeine.  Fatty, greasy foods.  Alcohol.  Tobacco.  Overeating.  Gelatin desserts.  Wash your hands well to avoid spreading bacteria and viruses.  Only take over-the-counter or prescription medicines for pain, discomfort, or fever as directed by your caregiver.  Ask your caregiver if you should continue all prescribed and over-the-counter medicines.  Keep all follow-up appointments with your caregiver. SEEK MEDICAL CARE IF:  You have abdominal pain and it increases or stays in one area (localizes).  You have a rash, stiff neck, or severe headache.  You are irritable, sleepy, or difficult to awaken.  You are weak, dizzy, or extremely thirsty. SEEK IMMEDIATE MEDICAL CARE IF:   You are unable to keep fluids down or you get worse despite treatment.  You have frequent episodes of vomiting or diarrhea.  You have blood or green matter (bile) in your vomit.  You have blood in your stool or your stool looks black and tarry.  You have not urinated in 6 to 8 hours, or you have only urinated a small amount of very dark urine.  You have a fever.  You faint. MAKE SURE YOU:   Understand these instructions.  Will watch your condition.  Will get help right away if you are not doing well or get worse. Document Released: 08/20/2005 Document Revised: 11/12/2011 Document Reviewed: 04/09/2011 [facility] Patient Information 2015 [place], [place]. This information is not intended to replace advice given to you by your health care provider. Make sure you discuss any questions you have with your health care  provider.  Sinusitis Sinusitis is redness, soreness, and inflammation of the paranasal sinuses. Paranasal sinuses are air pockets within the bones of your face (beneath the eyes, the middle of the forehead, or above the eyes). In healthy paranasal sinuses, mucus is able to drain out, and air is able to circulate through  them by way of your nose. However, when your paranasal sinuses are inflamed, mucus and air can become trapped. This can allow bacteria and other germs to grow and cause infection. Sinusitis can develop quickly and last only a short time (acute) or continue over a long period (chronic). Sinusitis that lasts for more than 12 weeks is considered chronic.  CAUSES  Causes of sinusitis include:  Allergies.  Structural abnormalities, such as displacement of the cartilage that separates your nostrils (deviated septum), which can decrease the air flow through your nose and sinuses and affect sinus drainage.  Functional abnormalities, such as when the small hairs (cilia) that line your sinuses and help remove mucus do not work properly or are not present. SIGNS AND SYMPTOMS  Symptoms of acute and chronic sinusitis are the same. The primary symptoms are pain and pressure around the affected sinuses. Other symptoms include:  Upper toothache.  Earache.  Headache.  Bad breath.  Decreased sense of smell and taste.  A cough, which worsens when you are lying flat.  Fatigue.  Fever.  Thick drainage from your nose, which often is green and may contain pus (purulent).  Swelling and warmth over the affected sinuses. DIAGNOSIS  Your health care provider will perform a physical exam. During the exam, your health care provider may:  Look in your nose for signs of abnormal growths in your nostrils (nasal polyps).  Tap over the affected sinus to check for signs of infection.  View the inside of your sinuses (endoscopy) using an imaging device that has a light attached (endoscope). If your health care provider suspects that you have chronic sinusitis, one or more of the following tests may be recommended:  Allergy tests.  Nasal culture. A sample of mucus is taken from your nose, sent to a lab, and screened for bacteria.  Nasal cytology. A sample of mucus is taken from your nose and examined by  your health care provider to determine if your sinusitis is related to an allergy. TREATMENT  Most cases of acute sinusitis are related to a viral infection and will resolve on their own within 10 days. Sometimes medicines are prescribed to help relieve symptoms (pain medicine, decongestants, nasal steroid sprays, or saline sprays).  However, for sinusitis related to a bacterial infection, your health care provider will prescribe antibiotic medicines. These are medicines that will help kill the bacteria causing the infection.  Rarely, sinusitis is caused by a fungal infection. In theses cases, your health care provider will prescribe antifungal medicine. For some cases of chronic sinusitis, surgery is needed. Generally, these are cases in which sinusitis recurs more than 3 times per year, despite other treatments. HOME CARE INSTRUCTIONS   Drink plenty of water. Water helps thin the mucus so your sinuses can drain more easily.  Use a humidifier.  Inhale steam 3 to 4 times a day (for example, sit in the bathroom with the shower running).  Apply a warm, moist washcloth to your face 3 to 4 times a day, or as directed by your health care provider.  Use saline nasal sprays to help moisten and clean your sinuses.  Take medicines only as directed by your health  care provider.  If you were prescribed either an antibiotic or antifungal medicine, finish it all even if you start to feel better. SEEK IMMEDIATE MEDICAL CARE IF:  You have increasing pain or severe headaches.  You have nausea, vomiting, or drowsiness.  You have swelling around your face.  You have vision problems.  You have a stiff neck.  You have difficulty breathing. MAKE SURE YOU:   Understand these instructions.  Will watch your condition.  Will get help right away if you are not doing well or get worse. Document Released: 08/20/2005 Document Revised: 01/04/2014 Document Reviewed: 09/04/2011 [facility] Patient  Information 2015 [place], [place]. This information is not intended to replace advice given to you by your health care provider. Make sure you discuss any questions you have with your health care provider.

## 2014-12-01 NOTE — Telephone Encounter (Signed)
Janett Billow - I don' t think we should crush the Vimpat. Thank you for looking into this. Please let patient know and have him follow up with Dr. Leonie Man.   Charisse March - Can you make sure this patient has a follow up with Dr. Leonie Man please?  Thank you

## 2014-12-01 NOTE — ED Notes (Signed)
Returned from xray

## 2014-12-01 NOTE — ED Notes (Signed)
NOTIFIED DR. RANCOUR OF PATIENTS LAB RESULTS OF CG4+LACTIC ACID @16 :24PM.

## 2014-12-01 NOTE — ED Provider Notes (Signed)
CSN: [id number]     Arrival date & time 12/01/14  1356 History   First MD Initiated Contact with Patient 12/01/14 1358     Chief Complaint  Patient presents with  . Altered Mental Status     (Consider location/radiation/quality/duration/timing/severity/associated sxs/prior Treatment) HPI Comments: Patient nonverbal from previous stroke. Presenting from home with altered mental status over the past 2 days. Febrile 103. Patient normally follows some commands but has not been over the past day. She's been less responsive and not as interactive. Level V caveat for nonverbal patient.  The history is provided by the patient and the EMS personnel. The history is limited by the condition of the patient.    Past Medical History  Diagnosis Date  . Hypertension   . Allergy     RHINITIS  . Diverticulosis   . Colonic polyp   . 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 Seizures   . Dementia    Past Surgical History  Procedure Laterality Date  . Loop recorder implant N/A 10/11/2014    MDT LINQ implanted by Dr Rayann Heman for cryptogenic stroke  . Tee without cardioversion N/A 10/11/2014    Procedure: TRANSESOPHAGEAL ECHOCARDIOGRAM (TEE);  Surgeon: Thayer Headings, MD;  Location: [facility] ENDOSCOPY;  Service: Cardiovascular;  Laterality: N/A;   Family History  Problem Relation Age of Onset  . Cancer Mother   . Arthritis Mother   . Heart disease Mother   . Prostate cancer Father   . Cancer Sister   . Colon cancer Neg Hx   . Rectal cancer Neg Hx   . Stomach cancer Neg Hx   . Stroke Brother     massive stroke    History  Substance Use Topics  . Smoking status: Never Smoker   . Smokeless tobacco: Never Used  . Alcohol Use: No    Review of Systems  Unable to perform ROS: Patient nonverbal      Allergies  Review of patient's allergies indicates no known allergies.  Home Medications   Prior to Admission medications   Medication Sig Start Date End Date Taking?  Authorizing Provider  acetaminophen (TYLENOL) 325 MG tablet Take 325 mg by mouth every 6 (six) hours as needed for moderate pain.    Historical Provider, MD  amLODipine (NORVASC) 5 MG tablet Take 1 tablet (5 mg total) by mouth daily. 11/03/14   Garvin Fila, MD  BYSTOLIC 10 MG tablet TAKE 1 TABLET BY MOUTH DAILY 04/12/14   Denita Lung, MD  clopidogrel (PLAVIX) 75 MG tablet Take 1 tablet (75 mg total) by mouth daily. 10/12/14   Orson Eva, MD  divalproex (DEPAKOTE SPRINKLE) 125 MG capsule Take 500 mg by mouth 3 (three) times daily.     Historical Provider, MD  irbesartan (AVAPRO) 150 MG tablet TAKE 1 TABLET BY MOUTH DAILY Patient taking differently: TAKE 1 TABLET BY MOUTH EVERY MORNING    Denita Lung, MD  Lacosamide (VIMPAT) 10 MG/ML SOLN Take 12.5 mLs (125 mg total) by mouth 2 (two) times daily. For seizure 10/15/14   Gildardo Cranker, DO  phenytoin (DILANTIN) 50 MG tablet Chew 100 mg by mouth 2 (two) times daily. Take 4 tabs=200 mg PO BID and 1 tab = 50 mg @ 12noon    Historical Provider, MD   BP 144/90 mmHg  Pulse 84  Temp(Src) 99.7 F (37.6 C) (Rectal)  Resp 16  SpO2 97% Physical Exam  Constitutional: No distress.  Obtunded, opens eyes to voice,  follows some commands on the right.  HENT:  Head: Normocephalic and atraumatic.  Mouth/Throat: Oropharynx is clear and moist. No oropharyngeal exudate.  Eyes: Conjunctivae and EOM are normal. Pupils are equal, round, and reactive to light.  Neck: Normal range of motion. Neck supple.  No meningismus  Cardiovascular: Normal rate, regular rhythm and normal heart sounds.   No murmur heard. Pulmonary/Chest: Breath sounds normal. No respiratory distress. He exhibits no tenderness.  Abdominal: Soft. There is no tenderness. There is no rebound and no guarding.  Musculoskeletal: Normal range of motion. He exhibits no edema or tenderness.  Neurological: He is alert.  Obtunded, nonverbal, follows commands with right arm. No movement on the left.  Skin:  Skin is warm.    ED Course  Procedures (including critical care time) Labs Review Labs Reviewed  I-STAT CG4 LACTIC ACID, ED - Abnormal; Notable for the following:    Lactic Acid, Venous 2.36 (*)    All other components within normal limits  CULTURE, BLOOD (ROUTINE X 2)  CULTURE, BLOOD (ROUTINE X 2)  URINE CULTURE  CBC WITH DIFFERENTIAL/PLATELET  COMPREHENSIVE METABOLIC PANEL  TROPONIN I  URINALYSIS, ROUTINE W REFLEX MICROSCOPIC  PHENYTOIN LEVEL, TOTAL  VALPROIC ACID LEVEL    Imaging Review Dg Chest 1 View  12/01/2014   CLINICAL DATA:  Altered mental status.  Stroke.  Prior CVA.  EXAM: CHEST  1 VIEW  COMPARISON:  02/19/2012.  FINDINGS: Cardiopericardial silhouette within normal limits. Mediastinal contours normal. Trachea midline. No airspace disease or effusion. Lung volumes are reduced, with elevation of both hemidiaphragms. Monitoring leads project over the chest.  IMPRESSION: Suboptimal inspiration.  No active cardiopulmonary disease.   Electronically Signed   By: Dereck Ligas M.D.   On: 12/01/2014 14:58   Ct Head Wo Contrast  12/01/2014   CLINICAL DATA:  Mental status changes. History of CVA 3 years ago with residual left-sided weakness. Initial encounter.  EXAM: CT HEAD WITHOUT CONTRAST  TECHNIQUE: Contiguous axial images were obtained from the base of the skull through the vertex without intravenous contrast.  COMPARISON:  Head CT 10/02/2014 and 11/09/2013.  FINDINGS: There is no evidence of acute intracranial hemorrhage, mass lesion, brain edema or extra-axial fluid collection. The ventricles and subarachnoid spaces are prominent but stable. Extensive right parietal occipital encephalomalacia from old PCA infarct is again noted. There is stable encephalomalacia within the left cerebellum and confluent periventricular white matter disease. No acute infarct identified.  There is chronic but progressive nodular opacification of the maxillary sinuses and nasal passages bilaterally. No  air- fluid levels identified. The mastoid air cells and middle ears are clear. The calvarium is intact.  IMPRESSION: 1. No acute intracranial findings identified. 2. Stable old right PCA distribution and left cerebellar infarcts and diffuse periventricular white matter disease. 3. Progressive chronic paranasal sinus disease.   Electronically Signed   By: Richardean Sale M.D.   On: 12/01/2014 15:03     EKG Interpretation   Date/Time:  Wednesday December 01 2014 14:07:41 EDT Ventricular Rate:  90 PR Interval:  162 QRS Duration: 86 QT Interval:  357 QTC Calculation: 437 R Axis:   -14 Text Interpretation:  Sinus rhythm Probable left atrial enlargement Left  ventricular hypertrophy No significant change was found Confirmed by  Wyvonnia Dusky  MD, Iolani Twilley [id number]) on 12/01/2014 2:18:31 PM      MDM   Final diagnoses:  Altered mental status   Patient from home with altered mental status. Wife at bedside states she called EMS because his  blood pressure was 180/100. He is completely bed bound and needs all of the ADLs performed for him.  Denies fever at home. 103 for EMS.  CT head shows no acute pathology. Chest x-ray with no obvious infiltrate.  Infectious workup pending. Also Dilantin and Depakote levels will be obtained. Urinalysis pending. Care transferred to Dr. Alvino Chapel at shift change. Will need admission for altered mental status.    Ezequiel Essex, MD 12/01/14 [phone]

## 2014-12-01 NOTE — ED Notes (Signed)
NOTIFIED DR.PICKERING FOR PATIENTS LAB RESULTS OF CG4+LACTIC ACID  ,12/01/2014.

## 2014-12-02 ENCOUNTER — Telehealth: Payer: Self-pay | Admitting: Family Medicine

## 2014-12-02 LAB — INFLUENZA PANEL BY PCR (TYPE A & B)
H1N1FLUPCR: NOT DETECTED
INFLBPCR: NEGATIVE
Influenza A By PCR: NEGATIVE

## 2014-12-02 LAB — URINE CULTURE
Colony Count: NO GROWTH
Culture: NO GROWTH

## 2014-12-02 NOTE — Telephone Encounter (Signed)
ok 

## 2014-12-02 NOTE — Telephone Encounter (Signed)
[name] called back and stated that she has done some additional research and found out that [employer] ins will pay for ambulance transport to and from Doctor. She would like him to stay your patient, however for the near future he will need to by transported by stretcher.She wants to make sure your are aware and ok with that. Pt was also seen in hospital yesterday and she wants to make sure you saw those notes. Please let me know so I can advise [name].

## 2014-12-02 NOTE — Telephone Encounter (Signed)
I called back.  Relayed info to [name].  She verbalized understanding and was agreeable to continuing Vimpat liquid.  Patient does have an appt scheduled on 06/06.

## 2014-12-02 NOTE — Telephone Encounter (Signed)
Merry Proud @ [place] requested approval to extend home health services for once a week for additional 3 weeks

## 2014-12-02 NOTE — Telephone Encounter (Signed)
Left message for Merry Proud to call back there was no name on voicemail

## 2014-12-02 NOTE — Telephone Encounter (Signed)
Patient was last seen on 3/1, Dr Leonie Man do you want to see patient sooner than scheduled appointment or should patient be seen by NP

## 2014-12-03 ENCOUNTER — Telehealth: Payer: Self-pay | Admitting: Family Medicine

## 2014-12-03 NOTE — Telephone Encounter (Signed)
Talked with Merry Proud and gave okay per [facility]

## 2014-12-03 NOTE — Telephone Encounter (Signed)
Merry Proud @ [facility] returned call to [facility]

## 2014-12-04 NOTE — ED Provider Notes (Signed)
  Physical Exam  BP 149/92 mmHg  Pulse 85  Temp(Src) 99.7 F (37.6 C) (Rectal)  Resp 16  SpO2 100%  Physical Exam  ED Course  Procedures  MDM Patient with some altered mental status. Patient is a very poor historian. Lab work shows minimally elevated lactic acid. Head CT showed sinusitis that was worsening. Chest x-ray and urine reassuring. After long discussion with patient's wife patient was discharged home. States he   is at his baseline. She would rather not be admitted possible to avoid it. patient has had IV fluids. He is tolerated some orals and ER. Will discharge under his wife's care. He will follow with his PCP.  Davonna Belling, MD 12/04/14 [phone]

## 2014-12-06 NOTE — Telephone Encounter (Signed)
Keep June appointment

## 2014-12-07 ENCOUNTER — Encounter: Payer: Self-pay | Admitting: Family Medicine

## 2014-12-07 ENCOUNTER — Telehealth: Payer: Self-pay | Admitting: Family Medicine

## 2014-12-07 ENCOUNTER — Ambulatory Visit (INDEPENDENT_AMBULATORY_CARE_PROVIDER_SITE_OTHER): Payer: Medicare PPO | Admitting: Family Medicine

## 2014-12-07 VITALS — HR 86

## 2014-12-07 DIAGNOSIS — L89151 Pressure ulcer of sacral region, stage 1: Secondary | ICD-10-CM

## 2014-12-07 DIAGNOSIS — Z23 Encounter for immunization: Secondary | ICD-10-CM | POA: Diagnosis not present

## 2014-12-07 DIAGNOSIS — I69898 Other sequelae of other cerebrovascular disease: Secondary | ICD-10-CM

## 2014-12-07 DIAGNOSIS — R208 Other disturbances of skin sensation: Secondary | ICD-10-CM | POA: Diagnosis not present

## 2014-12-07 DIAGNOSIS — R209 Unspecified disturbances of skin sensation: Secondary | ICD-10-CM

## 2014-12-07 DIAGNOSIS — G40209 Localization-related (focal) (partial) symptomatic epilepsy and epileptic syndromes with complex partial seizures, not intractable, without status epilepticus: Secondary | ICD-10-CM | POA: Diagnosis not present

## 2014-12-07 DIAGNOSIS — I69998 Other sequelae following unspecified cerebrovascular disease: Secondary | ICD-10-CM

## 2014-12-07 NOTE — Telephone Encounter (Signed)
Just put in note that you have discussed wound care condom cath back support elbow and heel pads and anything else that y'all discussed

## 2014-12-07 NOTE — Progress Notes (Signed)
   Subjective:    Patient ID: Craig [name], male    DOB: [birth year], 64 y.o.   MRN: [id number]  HPI He is brought in by ambulance for consultation. His wife is with him. He was in a nursing home but she was concerned about the care she is giving. Apparently he now has a skin breakdown in the sacral area. He is nonverbal. She is being helped at home by a neighbor and her son. Apparently [facility] his helping with PT, OT and swallowing. Their questions about his ability to swallow. She was told that he can swallow however she has noted pooling of food in his mouth. She has concerns over how to care for him at home. She also has concerns over finances and how to take care of him. He apparently is still on amoxicillin for treatment of presumed infection.  Review of Systems     Objective:   Physical Exam Alert and unresponsive to verbal stimuli although he does open his eyes and does seem to recognize [place]. Exam of the sacral area does show some slight skin breakdown but no underlying induration is noted. He does have a facial droop. Recent urine culture was negative.      Assessment & Plan:  Sacral decubitus ulcer, stage I  Partial symptomatic epilepsy with complex partial seizures, not intractable, without status epilepticus  Alterations of sensations, late effect of cerebrovascular disease  Need for prophylactic vaccination against Streptococcus pneumoniae (pneumococcus) - Plan: Pneumococcal conjugate vaccine 13-valent Discussed the overall care concerning this.He will finish the course of the antibiotic. Discussed the need for condom cath, back, elbow and heel pads as well as proper wound care. Explained that proper room care needs to be handled expeditiously and he needs to be in a skilled nursing facility to help with this as well as with eating and hydration. She will contact her Education officer, [employer]. She is also to get involved with an elder law firm to help with some the legal and financial  issues. I explained that the decubitus can get much worse and cause difficulty and do not think it would be appropriate for her or her son to him was by themselves. Over 45 minutes spent, greater than 50% in counseling and coordination of care

## 2014-12-07 NOTE — Telephone Encounter (Signed)
Patty, a nurse with [facility], returned a call to [facility] regarding pt and requested that Cheri call her back @ [phone]

## 2014-12-08 ENCOUNTER — Encounter: Payer: Self-pay | Admitting: Internal Medicine

## 2014-12-08 ENCOUNTER — Telehealth: Payer: Self-pay | Admitting: Family Medicine

## 2014-12-08 LAB — CULTURE, BLOOD (ROUTINE X 2)
CULTURE: NO GROWTH
Culture: NO GROWTH

## 2014-12-08 NOTE — Telephone Encounter (Signed)
[name] called & states new medication that [facility] put pt on Losartan potassium #2 25mg  (crushed)  is requiring a P.A. But states he has a lot of pills left and he's not hardly taking them because they are having a really hard time getting him to swallow anything she states she told JCL this yesterday.  Also states pt is going into a nursing home very soon and she won't be able to take the medications with him.  So I advised I would hold on the P.A. And for her to call me back if she indeed needs me to do it.  (Ins is [employer])

## 2014-12-08 NOTE — Telephone Encounter (Signed)
I called and spoke to Mrs. Craig [name].  Pt is in process of going to facility.  (I relayed that he will still be able to come to appts via transportation).  02-07-15 at 1030.  She verbalized understanding.

## 2014-12-10 ENCOUNTER — Ambulatory Visit (INDEPENDENT_AMBULATORY_CARE_PROVIDER_SITE_OTHER): Payer: Medicare PPO | Admitting: *Deleted

## 2014-12-10 ENCOUNTER — Telehealth: Payer: Self-pay

## 2014-12-10 DIAGNOSIS — I639 Cerebral infarction, unspecified: Secondary | ICD-10-CM

## 2014-12-10 NOTE — Telephone Encounter (Signed)
Craig [name] called and said bed sores are spreading what else can she do the nurse from [facility] only comes 2 times a week and she get to talk with attorney at 55 today and she has been to several nursing homes but it will take 45 days to get him in so I advised her just to make sure she is keeping him off of the sores and I would send you a message and go from there

## 2014-12-10 NOTE — Telephone Encounter (Signed)
We need to get [facility] out more frequently. See if you can get them to do that

## 2014-12-10 NOTE — Telephone Encounter (Signed)
I informed Craig [name] she said that she was going to find out about this because they only allow 2 days a week

## 2014-12-12 ENCOUNTER — Other Ambulatory Visit: Payer: Self-pay | Admitting: Nurse Practitioner

## 2014-12-14 NOTE — Progress Notes (Signed)
Loop recorder 

## 2014-12-16 ENCOUNTER — Other Ambulatory Visit: Payer: Self-pay | Admitting: Nurse Practitioner

## 2014-12-17 ENCOUNTER — Other Ambulatory Visit: Payer: Self-pay | Admitting: Internal Medicine

## 2014-12-20 ENCOUNTER — Other Ambulatory Visit: Payer: Self-pay | Admitting: Nurse Practitioner

## 2014-12-20 ENCOUNTER — Telehealth: Payer: Self-pay | Admitting: *Deleted

## 2014-12-20 NOTE — Telephone Encounter (Signed)
Noted in paceart.  

## 2014-12-20 NOTE — Telephone Encounter (Signed)
New message          Pt is now in [place] home and will be transmitting his device from there

## 2014-12-21 ENCOUNTER — Non-Acute Institutional Stay: Payer: Medicare PPO | Admitting: Internal Medicine

## 2014-12-21 DIAGNOSIS — L03317 Cellulitis of buttock: Secondary | ICD-10-CM

## 2014-12-21 DIAGNOSIS — R634 Abnormal weight loss: Secondary | ICD-10-CM | POA: Diagnosis not present

## 2014-12-21 DIAGNOSIS — L89150 Pressure ulcer of sacral region, unstageable: Secondary | ICD-10-CM | POA: Diagnosis not present

## 2014-12-21 DIAGNOSIS — I10 Essential (primary) hypertension: Secondary | ICD-10-CM | POA: Diagnosis not present

## 2014-12-21 DIAGNOSIS — I69320 Aphasia following cerebral infarction: Secondary | ICD-10-CM

## 2014-12-21 NOTE — Progress Notes (Signed)
Patient ID: Craig [name], male   DOB: 10-Oct-1950, 64 y.o.   MRN: [id number] Facility; [facility] Chief complaint; patient admission to SNF from his own home. Previously at [facility] skilled facility and prior to that was at [facility] where he was diagnosed with a new onset left parietal stroke History; this is a patient who apparently suffered a right hemisphere stroke 3 years ago. From a rehabilitation point of view he did well he was able to walk with a 4 post cane, do his own ADLs. By description of his wife the affected side at that point was his left side. He was readmitted to [facility] from 2/5 through 2/9 with an outside MRI showing a new left posterior parietal stroke. Since that time he has been paralyzed on the right, not speaking. He was at [facility] and discharged on 11/19/14.  Apparently he had a wound some time before this but it healed perhaps while at [facility]. His primary physician Dr. Redmond [name] makes reference to a stage I pressure area on 4/5 during an office visit. His wife notes that over the last 2 weeks that she struggled to get some admission organized into long-term care she noticed an odor but doesn't really have much information on the status of the wound.  Workup in the hospital included a TEE that showed no cardiac source of embolism. There was some question about paroxysmal atrial fibrillation although I don't really see that resolved at this point. I don't have access to the actual MRI report as it was done in an outside hospital.  Past Medical History  Diagnosis Date  . Hypertension   . Allergy     RHINITIS  . Diverticulosis   . Colonic polyp   . 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 Seizures   . Dementia    Past Surgical History  Procedure Laterality Date  . Loop recorder implant N/A 10/11/2014    MDT LINQ implanted by Dr Craig [name] for cryptogenic stroke  . Tee without cardioversion N/A 10/11/2014    Procedure: TRANSESOPHAGEAL  ECHOCARDIOGRAM (TEE);  Surgeon: Craig Headings, MD;  Location: [facility] ENDOSCOPY;  Service: Cardiovascular;  Laterality: N/A;   Current medications Diastolic 10 mg per day Depakote sprinkles 125 mg 4 capsules 3 times daily Dilantin 200 mg daily Losartan 50 mg daily Norvasc 5 daily Plavix 75 daily Vimpat 10 mg per ml 12.5 mg twice a day  Social; the patient came to this facility from his home. He has no advanced directives. I believe he was total care but is noted was quite functional up until this most recent stroke in February History   Social History  . Marital Status: Married    Spouse Name: Craig [name]  . Number of Children: 1  . Years of Education: college   Occupational History  . retired [employer]   Social History Main Topics  . Smoking status: Never Smoker   . Smokeless tobacco: Never Used  . Alcohol Use: No  . Drug Use: No  . Sexual Activity: Yes   Other Topics Concern  . Not on file   Social History Narrative   Patient lives at home with his wife  has 2 years of college and 1 child.    Patient works at  [employer].  Patient denies smoking tobacco,drinking alcohol and taking all illict drugs.    Patient is a caffeine drinker.  Patient is right handed.   family history includes Arthritis in his  mother; Cancer in his mother and sister; Heart disease in his mother; Prostate cancer in his father; Stroke in his brother. There is no history of Colon cancer, Rectal cancer, or Stomach cancer. 2 brothers apparently had strokes although he was in a family of 11 children  Review of systems is not possible from the patient; his wife states that he has lost perhaps 16 pounds perhaps over the last month. Staff report he is eating and drinking well. He has a condom catheter in place which the wife has suggested leaving in place.  Physical examination; weight is 154. Temperature 98.4, blood pressure 170/91 Gen. the patient is not in overt medical distress.  Depressed-looking. HEENT pupils equal and reactive. Would not open his mouth. His hearing appears to be intact. Respiratory clear entry bilaterally Cardiac heart sounds are normal no murmurs no gallops no carotid bruits Abdomen; not distended bowel sounds are positive. He is tender in the left flank area left mid quadrant. However there is no guarding or rebound. GU; condom catheter is in place; bladder is not distended. Neurologic; patient is unfortunately very badly damaged.  Cranial nerves; I was struck by his complete lack of facial definition. His eyes are open. He appears to have conjugate gaze in both sides as far as I can tell.  Motor; he would not respond to commands he appears to be able to move the left arm and left hand and to a small extent his left leg  Reflexes; both his knee jerks 3+ there was no clonus at the ankles. His Babinski response was equivocal. No Hoffman's reflex. Reflexes are present in his arms.  Speech; his wife states he can understand "everything" although I'm not sure about this. He will not follow any directions. He did however respond to his name when I said it. He had no spontaneous verbal output  Mental status; he appears very depressed. Would not even look at me. Skin; fairly extensive unstageable necrotic wound over his lower sacrum. There is tenderness around this but no crepitus, a marked pungent odor. There was surrounding erythema and tenderness noted.  Debridement note; the area at the border of this extensive necrotic wound and skin was anesthetized with 1% lidocaine without. The area was sanitized. Extensive surgical debridement of necrotic subcutaneous tissue, subcutaneous fat. A deep culture was done post debridement. Hemostasis with silver nitrate and direct pressure.   Impression/plan; #1 unstageable wound over the sacrum as described. Status post surgical debridement as described. I have no doubt this will be a stage IV wound at the end of this  although I still can't see any healthy tissue here. The patient will be started empirically on parenteral antibiotics while I await culture. He is already in a pressure-relief mattress, turning off this area will be paramount. #2 bihemispheric strokes as described. Some features of his exam made me wonder whether his actually involves his bilateral pons. An MRI from 11/21/12 showed an old right posterior cerebral and left superior cerebellar and right pontine and left cerebellar infarct. His most recent quoted MRI showed a left parietal CVA. He has extensive neurologic damage. #3 weight loss; he was apparently in the high 160s at [facility]. He has lost weight since then. I suspect he probably has a significant degree of protein calorie malnutrition. Fact I wouldn't be surprised if we are having a parenteral feeding discussion with the wife shortly #4 poorly controlled hypertension this will need to be carefully monitored

## 2014-12-28 ENCOUNTER — Non-Acute Institutional Stay (SKILLED_NURSING_FACILITY): Payer: Medicare PPO | Admitting: Internal Medicine

## 2014-12-28 DIAGNOSIS — E43 Unspecified severe protein-calorie malnutrition: Secondary | ICD-10-CM | POA: Diagnosis not present

## 2014-12-28 DIAGNOSIS — L03317 Cellulitis of buttock: Secondary | ICD-10-CM | POA: Diagnosis not present

## 2014-12-28 DIAGNOSIS — L89150 Pressure ulcer of sacral region, unstageable: Secondary | ICD-10-CM

## 2014-12-28 DIAGNOSIS — I69320 Aphasia following cerebral infarction: Secondary | ICD-10-CM

## 2014-12-28 NOTE — Progress Notes (Signed)
Patient ID: Craig [name], male   DOB: [birth year], 64 y.o.   MRN: [id number] Facility; [facility] Chief complaint; review of coccyx wound/nutritional issues History; please see my note from 12/21/14. Refill he this is an unfortunate the 64 year old man who is had a previous right hemisphere stroke 3 years ago. He made a good functional recovery. He was [facility] from [facility] through 2/9 with an outside MRI showing a new left posterior parietal stroke. He now has right hemiparesis, some degree of aphasia which is difficult to assess, and during my initial examination I wondered whether this could have some brainstem involvement as he appears to have facial weakness bilaterally [? Lower motor neuron].  Last week I did an extensive debridement in the facility of a necrotic, infected pressure ulcer. There was a fair amount of bleeding has he is on Plavix nevertheless we were eventually able to get homeostasis. Culture of this wound from 4/19 showed Klebsiella. He was placed on Rocephin empirically which will need to continue.  Laboratory work; his white count was 15.979% neutrophils hemoglobin of 12 platelet count of 312. Comprehensive metabolic panel showed a sodium of 146 BUN 11 creatinine of 0.56 and an albumin of 2.5.  According to his wife he eats 100% of his meals when she is present but that is not with every meal.  Review of systems is not possible from the patient whoever the wound care nurse reports that the wound is now deep with undermining. There is less odor. His weight is stable to increase since his arrival here going up from 154.4 pounds 258 pounds  Physical examination Gen. the patient is much as I saw I am last week nonverbal but will respond to his name. His wife feels he has complete comprehension I'm just not sure about this. Respiratory shallow but clear entry bilaterally Cardiac heart sounds are normal; at least at the bedside he does not appear to be grossly  dehydrated Abdomen; no liver no spleen no masses. Neurologic; I'm just not sure how much she really comprehends. He simply will not respond  to direct commands. He does not verbalize Skin; the areas over his coccyx and extending into his left buttock. Which is now a clearly stage III wound. I don't see exposed bone anywhere however there is extensive undermining here and I cannot visualize her feel most of the area in question. 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 I wonder whether there has been more progression in the brain stem/pontine issues than the original outside MRI described. He already had significant stroke damage from an MRI in March 2014 which showed an old right posterior cerebral and left superior cerebellar and right pontine damage. Whether or not he actually understands everything as his wife describes I am uncertain. #2 severe protein calorie malnutrition with an albumin of 2.5. I have extensively discussed this with his wife. If he is not able to maintain his nutritional status and/or makeup for what he is behind in I will need to discuss parenteral nutrition with her probably in the next week or 2. #3 infected decubitus ulcer which was unstageable on his arrival here and unfortunately grossly infected. This seems better from an infection point of view today. He will need to continue with her parenteral Rocephin he is on for the Klebsiella that we cultured out of the deep recesses of this wound. 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 There is still further debridement that needs to be done on this. He could continue with the Santyl, moist gauze and a further mechanical debridement will be likely next week. If week and get pulse lavage covered in the facility and we have a practitioner who can do this this would also be helpful.  There are already some questions about  whether the patient can maintain himself in this facility because of insurance issues previously at [facility] skilled facility. Think he can be discharged to home. I don't think his wife is capable of taking care of him. If it comes to this I think he'll have to be returned to acute care.  I plan to repeat his lab work next week to follow-up on his white count, albumin. I will check his inflammatory markers. It is possible that there is underlying osteomyelitis here.

## 2014-12-31 ENCOUNTER — Encounter: Payer: Self-pay | Admitting: Cardiology

## 2014-12-31 ENCOUNTER — Telehealth: Payer: Self-pay | Admitting: Internal Medicine

## 2014-12-31 NOTE — Telephone Encounter (Signed)
Pt's wife called stating that she is needing a letter or statement of why pt needs to be in [facility] and that he has bed sores. His insurance is not wanting to pay for him to stay there. Please fax it over to [facility] @ [phone]. Suzette name should only be on cover letter so not on letter

## 2015-01-01 ENCOUNTER — Emergency Department (HOSPITAL_COMMUNITY): Payer: Medicare PPO

## 2015-01-01 ENCOUNTER — Encounter (HOSPITAL_COMMUNITY): Payer: Self-pay | Admitting: Emergency Medicine

## 2015-01-01 ENCOUNTER — Inpatient Hospital Stay (HOSPITAL_COMMUNITY)
Admission: RE | Admit: 2015-01-01 | Discharge: 2015-01-04 | DRG: 871 | Disposition: A | Discharge status: Hospice | Payer: Medicare PPO | Attending: Family Medicine | Admitting: Family Medicine

## 2015-01-01 DIAGNOSIS — Z6825 Body mass index (BMI) 25.0-25.9, adult: Secondary | ICD-10-CM

## 2015-01-01 DIAGNOSIS — G92 Toxic encephalopathy: Secondary | ICD-10-CM | POA: Diagnosis present

## 2015-01-01 DIAGNOSIS — Z79899 Other long term (current) drug therapy: Secondary | ICD-10-CM

## 2015-01-01 DIAGNOSIS — IMO0002 Reserved for concepts with insufficient information to code with codable children: Secondary | ICD-10-CM

## 2015-01-01 DIAGNOSIS — Z8042 Family history of malignant neoplasm of prostate: Secondary | ICD-10-CM

## 2015-01-01 DIAGNOSIS — G40209 Localization-related (focal) (partial) symptomatic epilepsy and epileptic syndromes with complex partial seizures, not intractable, without status epilepticus: Secondary | ICD-10-CM | POA: Diagnosis present

## 2015-01-01 DIAGNOSIS — I69351 Hemiplegia and hemiparesis following cerebral infarction affecting right dominant side: Secondary | ICD-10-CM

## 2015-01-01 DIAGNOSIS — A419 Sepsis, unspecified organism: Principal | ICD-10-CM | POA: Diagnosis present

## 2015-01-01 DIAGNOSIS — I69320 Aphasia following cerebral infarction: Secondary | ICD-10-CM

## 2015-01-01 DIAGNOSIS — Z7902 Long term (current) use of antithrombotics/antiplatelets: Secondary | ICD-10-CM

## 2015-01-01 DIAGNOSIS — Z515 Encounter for palliative care: Secondary | ICD-10-CM

## 2015-01-01 DIAGNOSIS — E46 Unspecified protein-calorie malnutrition: Secondary | ICD-10-CM | POA: Diagnosis present

## 2015-01-01 DIAGNOSIS — Z823 Family history of stroke: Secondary | ICD-10-CM

## 2015-01-01 DIAGNOSIS — R509 Fever, unspecified: Secondary | ICD-10-CM | POA: Diagnosis not present

## 2015-01-01 DIAGNOSIS — I69393 Ataxia following cerebral infarction: Secondary | ICD-10-CM

## 2015-01-01 DIAGNOSIS — L89159 Pressure ulcer of sacral region, unspecified stage: Secondary | ICD-10-CM | POA: Diagnosis present

## 2015-01-01 DIAGNOSIS — J9811 Atelectasis: Secondary | ICD-10-CM | POA: Diagnosis present

## 2015-01-01 DIAGNOSIS — Z8673 Personal history of transient ischemic attack (TIA), and cerebral infarction without residual deficits: Secondary | ICD-10-CM

## 2015-01-01 DIAGNOSIS — I10 Essential (primary) hypertension: Secondary | ICD-10-CM | POA: Diagnosis present

## 2015-01-01 DIAGNOSIS — L0390 Cellulitis, unspecified: Secondary | ICD-10-CM | POA: Diagnosis present

## 2015-01-01 DIAGNOSIS — Z7401 Bed confinement status: Secondary | ICD-10-CM

## 2015-01-01 DIAGNOSIS — L89154 Pressure ulcer of sacral region, stage 4: Secondary | ICD-10-CM | POA: Diagnosis present

## 2015-01-01 DIAGNOSIS — G40909 Epilepsy, unspecified, not intractable, without status epilepticus: Secondary | ICD-10-CM | POA: Diagnosis present

## 2015-01-01 LAB — COMPREHENSIVE METABOLIC PANEL
ALT: 37 U/L (ref 0–53)
AST: 28 U/L (ref 0–37)
Albumin: 2.3 g/dL — ABNORMAL LOW (ref 3.5–5.2)
Alkaline Phosphatase: 94 U/L (ref 39–117)
Anion gap: 7 (ref 5–15)
BUN: 7 mg/dL (ref 6–23)
CALCIUM: 8.5 mg/dL (ref 8.4–10.5)
CHLORIDE: 100 mmol/L (ref 96–112)
CO2: 31 mmol/L (ref 19–32)
Creatinine, Ser: 0.49 mg/dL — ABNORMAL LOW (ref 0.50–1.35)
GFR calc Af Amer: >90 mL/min (ref >90)
GFR calc non Af Amer: >90 mL/min (ref >90)
GLUCOSE: 149 mg/dL — AB (ref 70–99)
Potassium: 4 mmol/L (ref 3.5–5.1)
SODIUM: 138 mmol/L (ref 135–145)
TOTAL PROTEIN: 6.2 g/dL (ref 6.0–8.3)
Total Bilirubin: 0.4 mg/dL (ref 0.3–1.2)

## 2015-01-01 LAB — CBC WITH DIFFERENTIAL/PLATELET
BASOS PCT: 0 % (ref 0–1)
Basophils Absolute: 0 10*3/uL (ref 0.0–0.1)
EOS PCT: 1 % (ref 0–5)
Eosinophils Absolute: 0.1 10*3/uL (ref 0.0–0.7)
HCT: 33.6 % — ABNORMAL LOW (ref 39.0–52.0)
Hemoglobin: 10.3 g/dL — ABNORMAL LOW (ref 13.0–17.0)
Lymphocytes Relative: 13 % (ref 12–46)
Lymphs Abs: 1.7 10*3/uL (ref 0.7–4.0)
MCH: 27.4 pg (ref 26.0–34.0)
MCHC: 30.7 g/dL (ref 30.0–36.0)
MCV: 89.4 fL (ref 78.0–100.0)
MONO ABS: 1.4 10*3/uL — AB (ref 0.1–1.0)
MONOS PCT: 11 % (ref 3–12)
NEUTROS PCT: 75 % (ref 43–77)
Neutro Abs: 9.6 10*3/uL — ABNORMAL HIGH (ref 1.7–7.7)
PLATELETS: 394 10*3/uL (ref 150–400)
RBC: 3.76 MIL/uL — ABNORMAL LOW (ref 4.22–5.81)
RDW: 13.5 % (ref 11.5–15.5)
WBC: 12.8 10*3/uL — AB (ref 4.0–10.5)

## 2015-01-01 LAB — I-STAT CG4 LACTIC ACID, ED: Lactic Acid, Venous: 2.34 mmol/L (ref 0.5–2.0)

## 2015-01-01 NOTE — ED Notes (Signed)
Pt from [facility], per staff pt co fever 101.4 axillary. Pt presents with sacral pressure ulcer. Hx stroke pt is non verbal. BP 150/90 pulse 116. Pt is non ambulatory . Pt is alert and oriented to normal. Pt received Rocephin for the past 4 days.

## 2015-01-01 NOTE — ED Notes (Signed)
Bed: WA02 Expected date:  Expected time:  Means of arrival:  Comments: EMS 

## 2015-01-01 NOTE — ED Provider Notes (Signed)
CSN: [id number]     Arrival date & time 01/01/15  2248 History   First MD Initiated Contact with Patient 01/01/15 2254     Chief Complaint  Patient presents with  . Code Sepsis     (Consider location/radiation/quality/duration/timing/severity/associated sxs/prior Treatment) Patient is a 64 y.o. male presenting with fever.  Fever Max temp prior to arrival:  101.4  Temp source:  Axillary Severity:  Moderate Onset quality:  Gradual Duration:  1 day Timing:  Constant Progression:  Unchanged Chronicity:  Recurrent Relieved by:  Nothing Worsened by:  Nothing tried Associated symptoms: no vomiting     Past Medical History  Diagnosis Date  . Hypertension   . Allergy     RHINITIS  . Diverticulosis   . Colonic polyp   . 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 Seizures   . Dementia    Past Surgical History  Procedure Laterality Date  . Loop recorder implant N/A 10/11/2014    MDT LINQ implanted by Dr Rayann Heman for cryptogenic stroke  . Tee without cardioversion N/A 10/11/2014    Procedure: TRANSESOPHAGEAL ECHOCARDIOGRAM (TEE);  Surgeon: Thayer Headings, MD;  Location: [facility] ENDOSCOPY;  Service: Cardiovascular;  Laterality: N/A;   Family History  Problem Relation Age of Onset  . Cancer Mother   . Arthritis Mother   . Heart disease Mother   . Prostate cancer Father   . Cancer Sister   . Colon cancer Neg Hx   . Rectal cancer Neg Hx   . Stomach cancer Neg Hx   . Stroke Brother     massive stroke    History  Substance Use Topics  . Smoking status: Never Smoker   . Smokeless tobacco: Never Used  . Alcohol Use: No    Review of Systems  Unable to perform ROS: Patient nonverbal  Constitutional: Positive for fever.  Gastrointestinal: Negative for vomiting.      Allergies  Review of patient's allergies indicates no known allergies.  Home Medications   Prior to Admission medications   Medication Sig Start Date End Date Taking? Authorizing Provider   acetaminophen (TYLENOL) 325 MG tablet Take 325 mg by mouth every 6 (six) hours as needed for moderate pain.    Historical Provider, MD  amLODipine (NORVASC) 5 MG tablet Take 1 tablet (5 mg total) by mouth daily. 11/03/14   Garvin Fila, MD  amoxicillin-clavulanate (AUGMENTIN) 875-125 MG per tablet Take 1 tablet by mouth every 12 (twelve) hours. 12/01/14   Davonna Belling, MD  BYSTOLIC 10 MG tablet TAKE 1 TABLET BY MOUTH DAILY 04/12/14   Denita Lung, MD  clopidogrel (PLAVIX) 75 MG tablet Take 1 tablet (75 mg total) by mouth daily. 10/12/14   Orson Eva, MD  divalproex (DEPAKOTE SPRINKLE) 125 MG capsule Take 500 mg by mouth 3 (three) times daily.     Historical Provider, MD  irbesartan (AVAPRO) 150 MG tablet TAKE 1 TABLET BY MOUTH DAILY Patient not taking: Reported on 12/01/2014    Denita Lung, MD  Lacosamide (VIMPAT) 10 MG/ML SOLN Take 12.5 mLs (125 mg total) by mouth 2 (two) times daily. For seizure 10/15/14   Gildardo Cranker, DO  losartan (COZAAR) 25 MG tablet Take 50 mg by mouth daily.    Historical Provider, MD  phenytoin (DILANTIN) 50 MG tablet Chew 100 mg by mouth 2 (two) times daily. Take 4 tabs=200 mg PO BID and 1 tab = 50 mg @ 12noon    Historical Provider, MD  BP 134/74 mmHg  Pulse 109  Temp(Src) 99.2 F (37.3 C) (Rectal)  Resp 20  SpO2 95% Physical Exam  Constitutional: He appears well-developed and well-nourished.  HENT:  Head: Normocephalic and atraumatic.  Eyes: Conjunctivae and EOM are normal.  Neck: Normal range of motion. Neck supple.  Cardiovascular: Normal rate, regular rhythm and normal heart sounds.   Pulmonary/Chest: Effort normal and breath sounds normal. No respiratory distress.  Abdominal: He exhibits no distension. There is no tenderness. There is no rebound and no guarding.  Musculoskeletal: Normal range of motion.  Neurological: He is alert.  bil weakness, limited exam  Skin: Skin is warm and dry.  Vitals reviewed.   ED Course  Procedures (including  critical care time) Labs Review Labs Reviewed  URINE CULTURE  CULTURE, BLOOD (ROUTINE X 2)  CULTURE, BLOOD (ROUTINE X 2)  CBC WITH DIFFERENTIAL/PLATELET  COMPREHENSIVE METABOLIC PANEL  URINALYSIS, ROUTINE W REFLEX MICROSCOPIC  I-STAT CG4 LACTIC ACID, ED    Imaging Review No results found.   EKG Interpretation None    \  CRITICAL CARE Performed by: Debby Freiberg   Total critical care time: 88 min  Critical care time was exclusive of separately billable procedures and treating other patients.  Critical care was necessary to treat or prevent imminent or life-threatening deterioration.  Critical care was time spent personally by me on the following activities: development of treatment plan with patient and/or surrogate as well as nursing, discussions with consultants, evaluation of patient's response to treatment, examination of patient, obtaining history from patient or surrogate, ordering and performing treatments and interventions, ordering and review of laboratory studies, ordering and review of radiographic studies, pulse oximetry and re-evaluation of patient's condition.   MDM   Final diagnoses:  Fever    64 y.o. male with pertinent PMH of bihemispheric cva presents with fever to tmax 101.4.  Difficult to ascertain source due to pt pt's baseline aphasia.  Exam today with st 4 decub ulcer of sacrum with surrounding erythema, malodor.  I spoke with the pt's HCPOA re: code status and she stated at this time pt was full code.    Workup as above significant for leukocytosis, elevated lactic acid. Chest x-ray and urine without other source for infection. Exam is consistent with likely cellulitis superimposed infection on chronic decubitus ulcer.  Admitted in stable condition  I have reviewed all laboratory and imaging studies if ordered as above  1. Fever         Debby Freiberg, MD 01/02/15 [phone]

## 2015-01-02 ENCOUNTER — Encounter (HOSPITAL_COMMUNITY): Payer: Self-pay | Admitting: *Deleted

## 2015-01-02 ENCOUNTER — Emergency Department (HOSPITAL_COMMUNITY): Payer: Medicare PPO

## 2015-01-02 DIAGNOSIS — A419 Sepsis, unspecified organism: Secondary | ICD-10-CM | POA: Diagnosis not present

## 2015-01-02 DIAGNOSIS — Z8673 Personal history of transient ischemic attack (TIA), and cerebral infarction without residual deficits: Secondary | ICD-10-CM | POA: Diagnosis not present

## 2015-01-02 DIAGNOSIS — L89150 Pressure ulcer of sacral region, unstageable: Secondary | ICD-10-CM | POA: Diagnosis not present

## 2015-01-02 DIAGNOSIS — IMO0002 Reserved for concepts with insufficient information to code with codable children: Secondary | ICD-10-CM

## 2015-01-02 DIAGNOSIS — L89154 Pressure ulcer of sacral region, stage 4: Secondary | ICD-10-CM | POA: Diagnosis present

## 2015-01-02 DIAGNOSIS — I69320 Aphasia following cerebral infarction: Secondary | ICD-10-CM

## 2015-01-02 DIAGNOSIS — Z6825 Body mass index (BMI) 25.0-25.9, adult: Secondary | ICD-10-CM | POA: Diagnosis not present

## 2015-01-02 DIAGNOSIS — E46 Unspecified protein-calorie malnutrition: Secondary | ICD-10-CM | POA: Diagnosis present

## 2015-01-02 DIAGNOSIS — Z7902 Long term (current) use of antithrombotics/antiplatelets: Secondary | ICD-10-CM | POA: Diagnosis not present

## 2015-01-02 DIAGNOSIS — I69351 Hemiplegia and hemiparesis following cerebral infarction affecting right dominant side: Secondary | ICD-10-CM | POA: Diagnosis not present

## 2015-01-02 DIAGNOSIS — G92 Toxic encephalopathy: Secondary | ICD-10-CM | POA: Diagnosis present

## 2015-01-02 DIAGNOSIS — G40209 Localization-related (focal) (partial) symptomatic epilepsy and epileptic syndromes with complex partial seizures, not intractable, without status epilepticus: Secondary | ICD-10-CM

## 2015-01-02 DIAGNOSIS — Z823 Family history of stroke: Secondary | ICD-10-CM | POA: Diagnosis not present

## 2015-01-02 DIAGNOSIS — Z7401 Bed confinement status: Secondary | ICD-10-CM | POA: Diagnosis not present

## 2015-01-02 DIAGNOSIS — G40909 Epilepsy, unspecified, not intractable, without status epilepticus: Secondary | ICD-10-CM | POA: Diagnosis present

## 2015-01-02 DIAGNOSIS — Z79899 Other long term (current) drug therapy: Secondary | ICD-10-CM | POA: Diagnosis not present

## 2015-01-02 DIAGNOSIS — I69393 Ataxia following cerebral infarction: Secondary | ICD-10-CM | POA: Diagnosis not present

## 2015-01-02 DIAGNOSIS — Z515 Encounter for palliative care: Secondary | ICD-10-CM | POA: Diagnosis not present

## 2015-01-02 DIAGNOSIS — L0390 Cellulitis, unspecified: Secondary | ICD-10-CM | POA: Diagnosis present

## 2015-01-02 DIAGNOSIS — Z8042 Family history of malignant neoplasm of prostate: Secondary | ICD-10-CM | POA: Diagnosis not present

## 2015-01-02 DIAGNOSIS — L89159 Pressure ulcer of sacral region, unspecified stage: Secondary | ICD-10-CM | POA: Diagnosis present

## 2015-01-02 DIAGNOSIS — R509 Fever, unspecified: Secondary | ICD-10-CM | POA: Diagnosis present

## 2015-01-02 DIAGNOSIS — J9811 Atelectasis: Secondary | ICD-10-CM | POA: Diagnosis present

## 2015-01-02 DIAGNOSIS — I10 Essential (primary) hypertension: Secondary | ICD-10-CM | POA: Diagnosis not present

## 2015-01-02 LAB — URINALYSIS, ROUTINE W REFLEX MICROSCOPIC
Bilirubin Urine: NEGATIVE
Glucose, UA: NEGATIVE mg/dL
Hgb urine dipstick: NEGATIVE
Ketones, ur: NEGATIVE mg/dL
LEUKOCYTES UA: NEGATIVE
NITRITE: NEGATIVE
PROTEIN: NEGATIVE mg/dL
Specific Gravity, Urine: 1.011 (ref 1.005–1.030)
UROBILINOGEN UA: 1 mg/dL (ref 0.0–1.0)
pH: 8 (ref 5.0–8.0)

## 2015-01-02 LAB — CBC
HEMATOCRIT: 34.7 % — AB (ref 39.0–52.0)
HEMOGLOBIN: 10.5 g/dL — AB (ref 13.0–17.0)
MCH: 27 pg (ref 26.0–34.0)
MCHC: 30.3 g/dL (ref 30.0–36.0)
MCV: 89.2 fL (ref 78.0–100.0)
Platelets: 385 10*3/uL (ref 150–400)
RBC: 3.89 MIL/uL — ABNORMAL LOW (ref 4.22–5.81)
RDW: 13.6 % (ref 11.5–15.5)
WBC: 14.4 10*3/uL — ABNORMAL HIGH (ref 4.0–10.5)

## 2015-01-02 LAB — BASIC METABOLIC PANEL
Anion gap: 6 (ref 5–15)
BUN: 7 mg/dL (ref 6–20)
CALCIUM: 8.4 mg/dL — AB (ref 8.9–10.3)
CHLORIDE: 101 mmol/L (ref 101–111)
CO2: 29 mmol/L (ref 22–32)
CREATININE: 0.55 mg/dL — AB (ref 0.61–1.24)
GFR calc Af Amer: >60 mL/min (ref >60)
GFR calc non Af Amer: >60 mL/min (ref >60)
Glucose, Bld: 134 mg/dL — ABNORMAL HIGH (ref 70–99)
Potassium: 4 mmol/L (ref 3.5–5.1)
Sodium: 136 mmol/L (ref 135–145)

## 2015-01-02 LAB — VALPROIC ACID LEVEL: Valproic Acid Lvl: 27 ug/mL — ABNORMAL LOW (ref 50.0–100.0)

## 2015-01-02 LAB — OCCULT BLOOD, POC DEVICE: Fecal Occult Bld: NEGATIVE

## 2015-01-02 LAB — PHENYTOIN LEVEL, TOTAL: Phenytoin Lvl: 2.8 ug/mL — ABNORMAL LOW (ref 10.0–20.0)

## 2015-01-02 LAB — SEDIMENTATION RATE: SED RATE: 72 mm/h — AB (ref 0–16)

## 2015-01-02 LAB — MRSA PCR SCREENING: MRSA by PCR: NEGATIVE

## 2015-01-02 LAB — C-REACTIVE PROTEIN: CRP: 16.6 mg/dL — ABNORMAL HIGH (ref <1.0)

## 2015-01-02 MED ORDER — VANCOMYCIN HCL 10 G IV SOLR
1500.0000 mg | Freq: Once | INTRAVENOUS | Status: AC
  Administered 2015-01-02: 1500 mg via INTRAVENOUS
  Filled 2015-01-02: qty 1500

## 2015-01-02 MED ORDER — LORAZEPAM 2 MG/ML IJ SOLN
1.0000 mg | INTRAMUSCULAR | Status: DC | PRN
  Administered 2015-01-02: 1 mg via INTRAVENOUS

## 2015-01-02 MED ORDER — SODIUM CHLORIDE 0.9 % IV SOLN
INTRAVENOUS | Status: AC
  Administered 2015-01-02: 75 mL/h via INTRAVENOUS
  Administered 2015-01-02: 20:00:00 via INTRAVENOUS

## 2015-01-02 MED ORDER — PRO-STAT SUGAR FREE PO LIQD
30.0000 mL | Freq: Two times a day (BID) | ORAL | Status: DC
  Administered 2015-01-02 – 2015-01-04 (×4): 30 mL via ORAL
  Filled 2015-01-02 (×5): qty 30

## 2015-01-02 MED ORDER — PIPERACILLIN-TAZOBACTAM 3.375 G IVPB
3.3750 g | Freq: Three times a day (TID) | INTRAVENOUS | Status: DC
  Administered 2015-01-02 – 2015-01-04 (×7): 3.375 g via INTRAVENOUS
  Filled 2015-01-02 (×8): qty 50

## 2015-01-02 MED ORDER — ADULT MULTIVITAMIN W/MINERALS CH
ORAL_TABLET | Freq: Every day | ORAL | Status: DC
  Administered 2015-01-03 – 2015-01-04 (×2): 1 via ORAL
  Filled 2015-01-02 (×2): qty 1

## 2015-01-02 MED ORDER — CLOPIDOGREL BISULFATE 75 MG PO TABS
75.0000 mg | ORAL_TABLET | Freq: Every day | ORAL | Status: DC
  Administered 2015-01-03: 75 mg via ORAL
  Filled 2015-01-02 (×2): qty 1

## 2015-01-02 MED ORDER — RESOURCE THICKENUP CLEAR PO POWD
ORAL | Status: DC | PRN
  Filled 2015-01-02: qty 125

## 2015-01-02 MED ORDER — LACOSAMIDE 200 MG/20ML IV SOLN
125.0000 mg | Freq: Two times a day (BID) | INTRAVENOUS | Status: DC
  Administered 2015-01-02 – 2015-01-04 (×5): 125 mg via INTRAVENOUS
  Filled 2015-01-02 (×9): qty 12.5

## 2015-01-02 MED ORDER — SODIUM CHLORIDE 0.9 % IJ SOLN
3.0000 mL | Freq: Two times a day (BID) | INTRAMUSCULAR | Status: DC
  Administered 2015-01-02: 3 mL via INTRAVENOUS

## 2015-01-02 MED ORDER — LORAZEPAM 2 MG/ML IJ SOLN
INTRAMUSCULAR | Status: AC
  Administered 2015-01-02: 1 mg via INTRAVENOUS
  Filled 2015-01-02: qty 1

## 2015-01-02 MED ORDER — PHENYTOIN 50 MG PO CHEW
200.0000 mg | CHEWABLE_TABLET | Freq: Two times a day (BID) | ORAL | Status: DC
  Filled 2015-01-02: qty 4

## 2015-01-02 MED ORDER — HEPARIN SODIUM (PORCINE) 5000 UNIT/ML IJ SOLN
5000.0000 [IU] | Freq: Three times a day (TID) | INTRAMUSCULAR | Status: DC
  Administered 2015-01-02 – 2015-01-04 (×8): 5000 [IU] via SUBCUTANEOUS
  Filled 2015-01-02 (×6): qty 1

## 2015-01-02 MED ORDER — DIVALPROEX SODIUM 125 MG PO CSDR: 500.0000 mg | DELAYED_RELEASE_CAPSULE | Freq: Three times a day (TID) | ORAL | Status: DC

## 2015-01-02 MED ORDER — CETYLPYRIDINIUM CHLORIDE 0.05 % MT LIQD
7.0000 mL | Freq: Two times a day (BID) | OROMUCOSAL | Status: DC
  Administered 2015-01-02 – 2015-01-04 (×5): 7 mL via OROMUCOSAL

## 2015-01-02 MED ORDER — VANCOMYCIN HCL IN DEXTROSE 1-5 GM/200ML-% IV SOLN
1000.0000 mg | Freq: Two times a day (BID) | INTRAVENOUS | Status: DC
  Administered 2015-01-02 – 2015-01-04 (×4): 1000 mg via INTRAVENOUS
  Filled 2015-01-02 (×5): qty 200

## 2015-01-02 MED ORDER — PIPERACILLIN-TAZOBACTAM 3.375 G IVPB 30 MIN
3.3750 g | Freq: Once | INTRAVENOUS | Status: AC
  Administered 2015-01-02: 3.375 g via INTRAVENOUS
  Filled 2015-01-02: qty 50

## 2015-01-02 MED ORDER — SODIUM CHLORIDE 0.9 % IV SOLN
200.0000 mg | Freq: Two times a day (BID) | INTRAVENOUS | Status: DC
  Administered 2015-01-02 – 2015-01-04 (×6): 200 mg via INTRAVENOUS
  Filled 2015-01-02 (×9): qty 4

## 2015-01-02 MED ORDER — LACOSAMIDE 50 MG PO TABS: 125.0000 mg | ORAL_TABLET | Freq: Two times a day (BID) | ORAL | Status: DC

## 2015-01-02 MED ORDER — VALPROATE SODIUM 500 MG/5ML IV SOLN
500.0000 mg | Freq: Three times a day (TID) | INTRAVENOUS | Status: DC
  Administered 2015-01-02 – 2015-01-04 (×8): 500 mg via INTRAVENOUS
  Filled 2015-01-02 (×9): qty 5

## 2015-01-02 MED ORDER — LOSARTAN POTASSIUM 50 MG PO TABS
50.0000 mg | ORAL_TABLET | Freq: Every day | ORAL | Status: DC
  Administered 2015-01-03 – 2015-01-04 (×2): 50 mg via ORAL
  Filled 2015-01-02 (×3): qty 1

## 2015-01-02 MED ORDER — CHLORHEXIDINE GLUCONATE 0.12 % MT SOLN
15.0000 mL | Freq: Two times a day (BID) | OROMUCOSAL | Status: DC
  Administered 2015-01-02 – 2015-01-04 (×5): 15 mL via OROMUCOSAL
  Filled 2015-01-02 (×5): qty 15

## 2015-01-02 MED ORDER — NEBIVOLOL HCL 10 MG PO TABS
10.0000 mg | ORAL_TABLET | Freq: Every day | ORAL | Status: DC
  Administered 2015-01-03 – 2015-01-04 (×2): 10 mg via ORAL
  Filled 2015-01-02 (×3): qty 1

## 2015-01-02 MED ORDER — AMLODIPINE BESYLATE 5 MG PO TABS
5.0000 mg | ORAL_TABLET | Freq: Every day | ORAL | Status: DC
  Administered 2015-01-03 – 2015-01-04 (×2): 5 mg via ORAL
  Filled 2015-01-02 (×3): qty 1

## 2015-01-02 NOTE — Progress Notes (Signed)
ANTIBIOTIC CONSULT NOTE - INITIAL  Pharmacy Consult for Zosyn/Vancomycin Indication: Wound infection  No Known Allergies  Patient Measurements: Weight: 170 lb (77.111 kg)   Vital Signs: Temp: 98.8 F (37.1 C) (05/01 0240) Temp Source: Oral (05/01 0240) BP: 151/90 mmHg (05/01 0240) Pulse Rate: 115 (05/01 0240) Intake/Output from previous day:   Intake/Output from this shift:    Labs:  Recent Labs  01/01/15 2311  WBC 12.8*  HGB 10.3*  PLT 394  CREATININE 0.49*   Estimated Creatinine Clearance: 98.9 mL/min (by C-G formula based on Cr of 0.49). No results for input(s): VANCOTROUGH, VANCOPEAK, VANCORANDOM, GENTTROUGH, GENTPEAK, GENTRANDOM, TOBRATROUGH, TOBRAPEAK, TOBRARND, AMIKACINPEAK, AMIKACINTROU, AMIKACIN in the last 72 hours.   Microbiology: No results found for this or any previous visit (from the past 720 hour(s)).  Medical History: Past Medical History  Diagnosis Date  . Hypertension   . Allergy     RHINITIS  . Diverticulosis   . Colonic polyp   . 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 Seizures   . Dementia     Medications:  Prescriptions prior to admission  Medication Sig Dispense Refill Last Dose  . acetaminophen (TYLENOL) 325 MG tablet Take 325 mg by mouth every 6 (six) hours as needed for moderate pain.   12/29/2014  . Amino Acids-Protein Hydrolys (FEEDING SUPPLEMENT, PRO-STAT SUGAR FREE 64,) LIQD Take 30 mLs by mouth 2 (two) times daily.   01/01/2015 at Unknown time  . amLODipine (NORVASC) 5 MG tablet Take 1 tablet (5 mg total) by mouth daily. 90 tablet 1 01/01/2015 at Unknown time  . BYSTOLIC 10 MG tablet TAKE 1 TABLET BY MOUTH DAILY 30 tablet 11 01/01/2015 at Unknown time  . cefTRIAXone (ROCEPHIN) 1 G injection Inject 1 g into the muscle daily. Mixed with lidocaine.   12/31/2014 at Unknown time  . cloNIDine (CATAPRES) 0.1 MG tablet Take 0.1 mg by mouth every 6 (six) hours as needed (for blood pressure greater than 160/100).    01/01/2015 at Unknown time  . clopidogrel (PLAVIX) 75 MG tablet Take 1 tablet (75 mg total) by mouth daily. 30 tablet 0 01/01/2015 at Unknown time  . divalproex (DEPAKOTE SPRINKLE) 125 MG capsule Take 500 mg by mouth 3 (three) times daily.    01/01/2015 at 1400  . Lacosamide (VIMPAT) 10 MG/ML SOLN Take 12.5 mLs (125 mg total) by mouth 2 (two) times daily. For seizure 600 mL 5 01/01/2015 at 2100  . losartan (COZAAR) 50 MG tablet Take 50 mg by mouth daily.   01/01/2015 at Unknown time  . Multiple Vitamins-Minerals (DECUBI-VITE PO) Take 1 capsule by mouth daily.   01/01/2015 at Unknown time  . phenytoin (DILANTIN) 50 MG tablet Chew 200 mg by mouth 2 (two) times daily.    01/01/2015 at 0900  . amoxicillin-clavulanate (AUGMENTIN) 875-125 MG per tablet Take 1 tablet by mouth every 12 (twelve) hours. (Patient not taking: Reported on 01/01/2015) 14 tablet 0   . irbesartan (AVAPRO) 150 MG tablet TAKE 1 TABLET BY MOUTH DAILY (Patient not taking: Reported on 12/01/2014) 30 tablet 11 Not Taking at Unknown time   Scheduled:  . heparin  5,000 Units Subcutaneous 3 times per day  . piperacillin-tazobactam (ZOSYN)  IV  3.375 g Intravenous Q8H  . sodium chloride  3 mL Intravenous Q12H  . vancomycin  1,500 mg Intravenous Once   Infusions:  . sodium chloride 75 mL/hr (01/02/15 0234)   Assessment: 28 yoM with hx of CVA x 2 in 2013  and 2016, with residual aphasia, currently non verbal and bed bound is being brought to the ED with fever and malodorous discharge from his sacral wound. Zosyn and Vancomycin per Rx for wound infection/Sepsis.   Goal of Therapy:  Vancomycin trough level 15-20 mcg/ml  Plan:   Zosyn 3.375 Gm IV q8h EI  Vancomycin 1500mg  x1 (MD) then 1Gm IV q12h  F/u SCr/cultures/levels as needed  Dorrene German 01/02/2015,3:03 AM

## 2015-01-02 NOTE — Progress Notes (Signed)
Patient had a witnessed generalized clonic-tonic like seizures that lasted for about a minute. He then further had some more jerking movements and a blank stare after. Precautions were made while patient was seizing. Vital signs taken and were stable at that time. Dr. Cruzita Lederer was made aware, ativan 1mg  IV given with good result. Patient now sleeping postictal.

## 2015-01-02 NOTE — Evaluation (Signed)
Clinical/Bedside Swallow Evaluation Patient Details  Name: Craig [name] MRN: [id number] Date of Birth: [birth year]  Today's Date: 01/02/2015 Time: SLP Start Time (ACUTE ONLY): 1455 SLP Stop Time (ACUTE ONLY): 1515 SLP Time Calculation (min) (ACUTE ONLY): 20 min  Past Medical History:  Past Medical History  Diagnosis Date  . Hypertension   . Allergy     RHINITIS  . Diverticulosis   . Colonic polyp   . 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 Seizures   . Dementia    Past Surgical History:  Past Surgical History  Procedure Laterality Date  . Loop recorder implant N/A 10/11/2014    MDT LINQ implanted by Dr Rayann Heman for cryptogenic stroke  . Tee without cardioversion N/A 10/11/2014    Procedure: TRANSESOPHAGEAL ECHOCARDIOGRAM (TEE);  Surgeon: Thayer Headings, MD;  Location: [facility];  Service: Cardiovascular;  Laterality: N/A;   HPI:  64 y.o. male has a past medical history significant for CVA x 2 in 2013 and 2016, with residual aphasia admitted with fever and malodorous discharge from his sacral wound. Diagnosed with sepsis. S/p seizure 5/1 am.    Assessment / Plan / Recommendation Clinical Impression  Swallow evaluation complete, limited due to mentation. Patient with limited oral acceptance of bolus despite max verbal, tactile cueing from both SLP and familiar family friend.  Limited pos that were  orally accepted were consumed with delayed oral transit but without overt s/s of aspiration. Discussed with patient's wife. Due to limited nature of exam, unable to confindently place patient on a po diet at this time. Wife requesting that patient be placed on pre-admission diet,understanding risk of aspiration. Reviewed aspiration precautions. Attempted to call MD for clarification of order without return call. RN aware. MD, please order dysphagia 1 with honey thick liquids if agree with above. Thank you.     Aspiration Risk  Moderate    Diet Recommendation  Dysphagia 1 (Puree);Honey   Medication Administration: Via alternative means Compensations: Slow rate;Small sips/bites    Other  Recommendations Oral Care Recommendations: Oral care BID Other Recommendations: Order thickener from pharmacy;Prohibited food (jello, ice cream, thin soups);Remove water pitcher   Follow Up Recommendations       Frequency and Duration    2 weeks   Pertinent Vitals/Pain n/a        Swallow Study    General Other Pertinent Information: 64 y.o. male has a past medical history significant for CVA x 2 in 2013 and 2016, with residual aphasia admitted with fever and malodorous discharge from his sacral wound. Diagnosed with sepsis. S/p seizure 5/1 am.  Previous Swallow Assessment: none noted in chart Diet Prior to this Study: NPO Temperature Spikes Noted: No Respiratory Status: Room air History of Recent Intubation: No Behavior/Cognition: Alert;Confused;Doesn't follow directions;Requires cueing Self-Feeding Abilities: Total assist Patient Positioning: Upright in bed Baseline Vocal Quality:  (non verbal) Volitional Cough: Cognitively unable to elicit Volitional Swallow: Unable to elicit    Oral/Motor/Sensory Function Overall Oral Motor/Sensory Function:  (unable to assess due to current mental status)   Ice Chips Ice chips: Within functional limits Presentation: Spoon   Thin Liquid Thin Liquid: Not tested    Nectar Thick Nectar Thick Liquid: Not tested   Honey Thick Honey Thick Liquid: Not tested   Puree Puree: Impaired Presentation: Spoon Oral Phase Impairments: Impaired anterior to posterior transit Oral Phase Functional Implications: Oral holding;Prolonged oral transit   Solid   GO   Craig Buchan MA, CCC-SLP [phone]  Solid: Not  tested       Craig [name] 01/02/2015,3:17 PM

## 2015-01-02 NOTE — ED Notes (Addendum)
MD Colin Rhein at bedside. Assisted MD with assessment of sacral decubitus. Decubitus is stage IV versus unstagable. Foul odor from wound.

## 2015-01-02 NOTE — Progress Notes (Addendum)
8:03 AM I agree with HPI/GPe and A/P per Dr. Renne Crigler  63 ? 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 At baseline Aphasic + non-verbal Found to have decubitus ulcer April--Now unstageable despite debridement from Dr. Dellia Nims Admitted 01/02/15 early am with malodorous wound  Admitted for concern Osteo with Sepsis -Found to have ESR 72 Has a Sz as well overnight and found to have SubTx Phenytoin + Valproic levels CXR neg DG sacrum=Large soft tissue ulceration    Patient Active Problem List   Diagnosis Date Noted  . Sepsis 01/02/2015  . History of stroke 01/02/2015  . Decubitus ulcer of sacral region 01/02/2015  . Aphasia complicating stroke 87/19/5974  . Right hemiparesis   . Essential hypertension 10/11/2014  . Stroke   . Dependent edema 08/17/2014  . Memory loss 08/17/2013  . 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 Alterations of sensations, late effect of cerebrovascular disease 01/29/2012  . 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 CVA (cerebral infarction) 12/06/2011  . Hypertension, accelerated 05/15/2011    >30 min discussion at bedside with wife, son, MIL and friend We discussed current status and options including potential work-up for surgery, Seizure, Gradual decline from CVa in 09/2014 with bed=sore that started in Jan, with it's decline over the past 2-3 months  Family is torn between making a decision between comfort care and aggressive medical therapy but appreciate the quality>quanitity of life issues raised They understand that a feeding tube long-term ? risk of aspiration They are interested in discussion regarding what Palliative care may entail and I will consult them I have discontinued the EEg ordered and we will continue for now IV abx as  well as supportive therapies  Verneita Griffes, MD [facility] [phone]

## 2015-01-02 NOTE — Care Management Note (Signed)
Case Management Note  Patient Details  Name: Craig [name] MRN: [id number] Date of Birth: 1951/02/12  Subjective/Objective:   64 y/o m admitted w/Sepsis.                 Action/Plan: Monitor for progress & d/c plans, Noted palliative cons.   Expected Discharge Date:  01/06/15               Expected Discharge Plan:  [place] (From [facility])  In-House Referral:  Clinical Social Work  Discharge planning Services  CM Consult  Post Acute Care Choice:    Choice offered to:     DME Arranged:    DME Agency:     HH Arranged:    [place] Agency:     Status of Service:  In process, will continue to follow  Medicare Important Message Given:    Date Medicare IM Given:    Medicare IM give by:    Date Additional Medicare IM Given:    Additional Medicare Important Message give by:     If discussed at [place] of Stay Meetings, dates discussed:    Additional Comments:  Dessa Phi, RN 01/02/2015, 3:21 PM

## 2015-01-02 NOTE — Consult Note (Signed)
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 Bilateral hips, heels intact. Wound type:pressure Pressure Ulcer POA: Yes Measurement:7cm x 5.5cm x 1cm with undermining at 9-2 o'clock. Wound bed: 75% red, moist, 24% with dark necrotic slough Drainage (amount, consistency, odor) small to moderate amount of exudate with out odor. Periwound:intact Dressing procedure/placement/frequency:I will implement a twice daily saline Kerliz roll gauze dressing to avoid retained gauze. i will not provide a therapeutic sleep surface (mattress) as his hips are intact and he can easily be turned side to side.  I feel that the wounds are not responding to therapy at least in part due to the nutritional deficiencies. Patient would have to consume a very large amount of high nutrient calories to compensate for the Stage 4 PrI at the sacrum, much less provide for adequate stores required for tissue repair.The only other thing I was going to provide might have been bilateral pressure redistribution heel boots (Prevalon), but the RN tells me that he came in from the SNF with those and that the wife has perhaps brought them home.  If replacements are needed, please order. Junior nursing team will not follow, but will remain available to this patient, the nursing and medical teams.  Please re-consult if needed. Thanks, Maudie Flakes, MSN, RN, [place], [place], [place] [phone])

## 2015-01-02 NOTE — ED Notes (Signed)
Pt turned and repositioned onto right side. Pillows in between bony prominences.

## 2015-01-02 NOTE — H&P (Signed)
History and Physical    Craig [name] HAL:[id number] DOB: 1950/10/26 DOA: 01/01/2015  Referring physician: Dr. Colin Rhein  PCP: Wyatt Haste, MD  Specialists: none   Chief Complaint: fever/malodorous sacral wound  HPI: Craig [name] is a 64 y.o. male has a past medical history significant for CVA x 2 in 2013 and 2016, with residual aphasia, currently non verbal and bed bound is being brought to the ED with fever and malodorous discharge from his sacral wound. Over the past month he has been having progressive sacral decubitus which per notes appeared as a slight skin breakdown at the beginning of April and progressed now to be unstageable. Dr. Dellia Nims did an extensive debridement of the area and per notes he has been growing Klebsiella, no sensitivities available but patient was on Ceftriaxone prior to his arrival. Patient is non verbal, is awake and tracks me in the room however does not follow any commands; occasionally he shakes his head "yes/no" however not doing that currently. Patient's wife is at bedside and provides some of the story however she does not know much as he is in a nursing home. In the ED patient with low grade temp, tachycardic, with leukocytosis and elevated lactic acid. His wound is malodorous. TRH asked for admission for sepsis due to sacral wound  Review of Systems: unable to obtain ROS due to underlying aphasia.  Past Medical History  Diagnosis Date  . Hypertension   . Allergy     RHINITIS  . Diverticulosis   . Colonic polyp   . 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 Seizures   . Dementia    Past Surgical History  Procedure Laterality Date  . Loop recorder implant N/A 10/11/2014    MDT LINQ implanted by Dr Rayann Heman for cryptogenic stroke  . Tee without cardioversion N/A 10/11/2014    Procedure: TRANSESOPHAGEAL ECHOCARDIOGRAM (TEE);  Surgeon: Thayer Headings, MD;  Location: [facility];  Service: Cardiovascular;  Laterality: N/A;    Social History:  reports that he has never smoked. He has never used smokeless tobacco. He reports that he does not drink alcohol or use illicit drugs.  No Known Allergies  Family History  Problem Relation Age of Onset  . Cancer Mother   . Arthritis Mother   . Heart disease Mother   . Prostate cancer Father   . Cancer Sister   . Colon cancer Neg Hx   . Rectal cancer Neg Hx   . Stomach cancer Neg Hx   . Stroke Brother     massive stroke     Prior to Admission medications   Medication Sig Start Date End Date Taking? Authorizing Provider  acetaminophen (TYLENOL) 325 MG tablet Take 325 mg by mouth every 6 (six) hours as needed for moderate pain.   Yes Historical Provider, MD  Amino Acids-Protein Hydrolys (FEEDING SUPPLEMENT, PRO-STAT SUGAR FREE 64,) LIQD Take 30 mLs by mouth 2 (two) times daily.   Yes Historical Provider, MD  amLODipine (NORVASC) 5 MG tablet Take 1 tablet (5 mg total) by mouth daily. 11/03/14  Yes Garvin Fila, MD  BYSTOLIC 10 MG tablet TAKE 1 TABLET BY MOUTH DAILY 04/12/14  Yes Denita Lung, MD  cefTRIAXone (ROCEPHIN) 1 G injection Inject 1 g into the muscle daily. Mixed with lidocaine.   Yes Historical Provider, MD  cloNIDine (CATAPRES) 0.1 MG tablet Take 0.1 mg by mouth every 6 (six) hours as needed (for blood pressure greater than 160/100).   Yes  Historical Provider, MD  clopidogrel (PLAVIX) 75 MG tablet Take 1 tablet (75 mg total) by mouth daily. 10/12/14  Yes Orson Eva, MD  divalproex (DEPAKOTE SPRINKLE) 125 MG capsule Take 500 mg by mouth 3 (three) times daily.    Yes Historical Provider, MD  Lacosamide (VIMPAT) 10 MG/ML SOLN Take 12.5 mLs (125 mg total) by mouth 2 (two) times daily. For seizure 10/15/14  Yes Gildardo Cranker, DO  losartan (COZAAR) 50 MG tablet Take 50 mg by mouth daily.   Yes Historical Provider, MD  Multiple Vitamins-Minerals (DECUBI-VITE PO) Take 1 capsule by mouth daily.   Yes Historical Provider, MD  phenytoin (DILANTIN) 50 MG tablet Chew 200 mg  by mouth 2 (two) times daily.    Yes Historical Provider, MD  amoxicillin-clavulanate (AUGMENTIN) 875-125 MG per tablet Take 1 tablet by mouth every 12 (twelve) hours. Patient not taking: Reported on 01/01/2015 12/01/14   Davonna Belling, MD  irbesartan (AVAPRO) 150 MG tablet TAKE 1 TABLET BY MOUTH DAILY Patient not taking: Reported on 12/01/2014    Denita Lung, MD   Physical Exam: Filed Vitals:   01/02/15 0020 01/02/15 0050 01/02/15 0100 01/02/15 0130  BP:  146/88 143/90 150/91  Pulse: 105 108 107 104  Temp:      TempSrc:      Resp: 21 18 20 21   Weight:      SpO2: 95% 96% 97% 98%     General:  Non verbal  Eyes: no scleral icterus  Neck: supple, no lymphadenopathy  Cardiovascular: regular rate without MRG; 2+ peripheral pulses, no JVD, no peripheral edema  Respiratory: CTA biL, good air movement without wheezing on anterior auscultation  Abdomen: soft, positive bowel sounds, no guarding  Skin: no rashes  Musculoskeletal: normal bulk and tone, no joint swelling; large foul smelling sacral wound ~ 5-8 cm in diameter with mild surrounding cellulitis.  Psychiatric: non verbal  Neurologic: does not follow commands, spontaneous movements of his left arm   Labs on Admission:  Basic Metabolic Panel:  Recent Labs Lab 01/01/15 2311  NA 138  K 4.0  CL 100  CO2 31  GLUCOSE 149*  BUN 7  CREATININE 0.49*  CALCIUM 8.5   Liver Function Tests:  Recent Labs Lab 01/01/15 2311  AST 28  ALT 37  ALKPHOS 94  BILITOT 0.4  PROT 6.2  ALBUMIN 2.3*   CBC:  Recent Labs Lab 01/01/15 2311  WBC 12.8*  NEUTROABS 9.6*  HGB 10.3*  HCT 33.6*  MCV 89.4  PLT 394   Radiological Exams on Admission: Dg Chest 2 View  01/01/2015   CLINICAL DATA:  Acute onset of fever and high blood pressure. Tachycardia. Initial encounter.  EXAM: CHEST  2 VIEW  COMPARISON:  Chest radiograph performed 12/01/2014  FINDINGS: The lungs are hypoexpanded. Mild bibasilar atelectasis is noted. There is  no evidence of pleural effusion or pneumothorax.  The heart is borderline normal in size. No acute osseous abnormalities are seen.  IMPRESSION: Lungs hypoexpanded, with mild bibasilar atelectasis.   Electronically Signed   By: Garald Balding M.D.   On: 01/01/2015 23:29   Dg Sacrum/coccyx  01/02/2015   CLINICAL DATA:  Acute onset of fever. Sacral pressure ulcer. High blood pressure. Initial encounter.  EXAM: SACRUM AND COCCYX - 2+ VIEW  COMPARISON:  Lumbar spine radiographs performed 08/23/2014  FINDINGS: A large soft tissue ulceration is noted overlying the coccyx. Underlying erosion of the coccyx cannot be excluded, but is not well assessed.  There is no  evidence of fracture or dislocation. The sacroiliac joints are unremarkable in appearance. The visualized portions of both hips are within normal limits. The visualized bowel gas pattern is grossly unremarkable.  IMPRESSION: Large soft tissue ulceration overlying the coccyx. Underlying erosion of the coccyx cannot be excluded, but is not well assessed on radiograph. MRI could be considered for further evaluation, if deemed clinically appropriate.   Electronically Signed   By: Garald Balding M.D.   On: 01/02/2015 01:18    Assessment/Plan Principal Problem:   Sepsis Active Problems:   Complex partial epileptic seizure   Essential hypertension   History of stroke   Decubitus ulcer of sacral region   Aphasia complicating stroke   Sepsis due to cellulitis / sacral wound, unsteagable  - start patient on vancomycin and Zosyn - obtain blood cultures  - consult wound care >> may need surgical consult - will discuss with cardiology in am as patient has a loop recorder to determine if he can have an MRI; will need to r/o osteo - obtain CRP and sedimentation rate  History of CVA with aphasia / right hemiparesis / bed bound state - NPO, obtain SLP evaluation for now as I am not sure about his ability to eat - continue Plavix - has a loop recorder, as  above will touch base with cardiology regarding MRI  HTN  - continue home medications  Malnutrition - nutrition consult - re-address with wife nutrition goals once SLO evaluates.  Seizures - continue home medications  Goals of care - discussed with his wife outside patient's room per her request; She is having a hard time coping with his current condition and is not sure how to answer questions regarding code status, nutrition questions and so on. I tried my best to explain his overall trajectory based on the data so far, but given his current neurological status his prognosis is overall poor and will be very difficult to treat his wound. He had some stool soiling his wound on my evaluation, is bedbound and has seen little to no recovery in the past 3 months since his stroke. I explained the concept of a palliative consult as to help her be prepared for hard choices in the future. She doesn't think she is ready to discuss right now. She wants to talk to him further in the day time as she feels like he is more alert during daytime before further commitment.    Diet: NPO Fluids: NS DVT Prophylaxis: heparin  Code Status: Full  Family Communication: d/w wife bedside  Disposition Plan: admit to telemetry   Time spent: 70 minutes, more than 50% in family discussions  Craig [name] M. Cruzita Lederer, MD [facility] Pager [phone]  If 7PM-7AM, please contact night-coverage [email or web address] Password [email or web address] 01/02/2015, 2:32 AM

## 2015-01-03 DIAGNOSIS — I10 Essential (primary) hypertension: Secondary | ICD-10-CM

## 2015-01-03 LAB — URINE CULTURE

## 2015-01-03 NOTE — Progress Notes (Signed)
Speech Language Pathology Treatment: Dysphagia  Patient Details Name: Craig [name] MRN: [id number] DOB: 1950/10/05 Today's Date: 01/03/2015 Time: 9450-3888 SLP Time Calculation (min) (ACUTE ONLY): 20 min  Assessment / Plan / Recommendation Clinical Impression  Pt seen to assess tolerance of po diet and for family education if present.  Spoke to RN who reports pt with excessively delayed swallow but no clinical indications of aspiration with minimal intake pt accepted.   SLP provided pt with Ensure pudding - oral holding noted without swallow elicited after approximately 30 seconds.  SLP used dry spoon pressure to tongue and tsp honey thick liquid to aid swallow trigger.  Presumed premature spillage of liquid into pharynx eliciting swallow.  No s/s of aspiration with all consistencies tested. RN reports spouse states pt's liquid are thicker than honey at facility.  SLP advised use of honey as oral transit time and swallow delay is significantly decreased compared to pudding/puree.    Pt readily opens mouth to accept more intake when desire although he is a laborious feeder which will increase his malnutrition/dehydration risk.  Pt did not follow commands nor phonate during session. Mentation appears better today (seizure) and subsequently swallow response is improved.   No family present at this time, recommend continue puree/honey via cup with strict precautions.     HPI Other Pertinent Information: 64 y.o. male has a past medical history significant for CVA x 2 in 2013 and 2016, with residual aphasia admitted with fever and malodorous discharge from his sacral wound. Diagnosed with sepsis. S/p seizure 5/1 am.    Pertinent Vitals Pain Assessment: Faces Faces Pain Scale: No hurt  SLP Plan  Continue with current plan of care;Other (Comment) (no family present during today's session)    Recommendations Diet recommendations: Dysphagia 1 (puree);Honey-thick liquid Liquids provided via:  Cup;Teaspoon;No straw Medication Administration: Crushed with puree Supervision: Full supervision/cueing for compensatory strategies Compensations: Slow rate;Small sips/bites;Check for pocketing (oral suction prn) Postural Changes and/or Swallow Maneuvers: Seated upright 90 degrees;Upright 30-60 min after meal              Oral Care Recommendations: Oral care BID Follow up Recommendations:  (TBD, pt for palliative meeting) Plan: Continue with current plan of care;Other (Comment) (no family present during today's session)    [place], [facility] SLP [phone]

## 2015-01-03 NOTE — Progress Notes (Signed)
Craig [name] QBH:[id number] DOB: 06-22-1951 DOA: 01/01/2015 PCP: Wyatt Haste, MD  Brief narrative: 64 ? 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 At baseline Aphasic + non-verbal Found to have decubitus ulcer April--Now unstageable despite debridement from Dr. Dellia Nims Admitted 01/02/15 early am with malodorous wound    Past medical history-As per Problem list Chart reviewed as below-   Consultants:  Palliative care  Procedures:  Xray back  Antibiotics:  Zosyn   Subjective  More alert. Seems closer to baseline No n/v/cp No CP  Objective    Interim History:   Telemetry:   Objective: Filed Vitals:   01/02/15 2101 01/02/15 2206 01/03/15 0508 01/03/15 0954  BP:  149/94 148/85 171/88  Pulse: 128 114 115 123  Temp: 99.9 F (37.7 C)  99.5 F (37.5 C)   TempSrc: Oral  Oral   Resp: 18 20 20 20   Height:      Weight:      SpO2: 99%  99% 99%    Intake/Output Summary (Last 24 hours) at 01/03/15 1447 Last data filed at 01/03/15 1307  Gross per 24 hour  Intake 2200.5 ml  Output   1825 ml  Net  375.5 ml    Exam:  General: eomi,ncat-cannot appreciate dentition. Cardiovascular: s1 s2 no m/r/g Respiratory: clear no added sound Abdomen: soft, Nt, Nd Skin no LE edema Neuro   Data Reviewed: Basic Metabolic Panel:  Recent Labs Lab 01/01/15 2311 01/02/15 0447  NA 138 136  K 4.0 4.0  CL 100 101  CO2 31 29  GLUCOSE 149* 134*  BUN 7 7  CREATININE 0.49* 0.55*  CALCIUM 8.5 8.4*   Liver Function Tests:  Recent Labs Lab 01/01/15 2311  AST 28  ALT 37  ALKPHOS 94  BILITOT 0.4  PROT 6.2  ALBUMIN 2.3*   No results for input(s): LIPASE, AMYLASE in the last 168 hours. No results for input(s): AMMONIA in the last 168 hours. CBC:  Recent Labs Lab 01/01/15 2311 01/02/15 0447  WBC  12.8* 14.4*  NEUTROABS 9.6*  --   HGB 10.3* 10.5*  HCT 33.6* 34.7*  MCV 89.4 89.2  PLT 394 385   Cardiac Enzymes: No results for input(s): CKTOTAL, CKMB, CKMBINDEX, TROPONINI in the last 168 hours. BNP: Invalid input(s): POCBNP CBG: No results for input(s): GLUCAP in the last 168 hours.  Recent Results (from the past 240 hour(s))  Blood Culture (routine x 2)     Status: None (Preliminary result)   Collection Time: 01/02/15 12:01 AM  Result Value Ref Range Status   Specimen Description BLOOD LEFT FOREARM  Final   Special Requests BOTTLES DRAWN AEROBIC AND ANAEROBIC 5CC  Final   Culture   Final           BLOOD CULTURE RECEIVED NO GROWTH TO DATE CULTURE WILL BE HELD FOR 5 DAYS BEFORE ISSUING A FINAL NEGATIVE REPORT Performed at [facility]    Report Status PENDING  Incomplete  Blood Culture (routine x 2)     Status: None (Preliminary result)   Collection Time: 01/02/15 12:01 AM  Result Value Ref Range Status   Specimen Description BLOOD RIGHT ANTECUBITAL  Final   Special Requests BOTTLES DRAWN AEROBIC AND ANAEROBIC 5CC  Final   Culture   Final  BLOOD CULTURE RECEIVED NO GROWTH TO DATE CULTURE WILL BE HELD FOR 5 DAYS BEFORE ISSUING A FINAL NEGATIVE REPORT Performed at [facility]    Report Status PENDING  Incomplete  Urine culture     Status: None   Collection Time: 01/02/15  2:39 AM  Result Value Ref Range Status   Specimen Description URINE, CLEAN CATCH  Final   Special Requests NONE  Final   Colony Count   Final    25,000 COLONIES/ML Performed at [facility]    Culture   Final    Multiple bacterial morphotypes present, none predominant. Suggest appropriate recollection if clinically indicated. Performed at [facility]    Report Status 01/03/2015 FINAL  Final  MRSA PCR Screening     Status: None   Collection Time: 01/02/15  4:23 AM  Result Value Ref Range Status   MRSA by PCR NEGATIVE NEGATIVE Final    Comment:          The GeneXpert MRSA Assay (FDA approved for NASAL specimens only), is one component of a comprehensive MRSA colonization surveillance program. It is not intended to diagnose MRSA infection nor to guide or monitor treatment for MRSA infections.      Studies:              All Imaging reviewed and is as per above notation   Scheduled Meds: . amLODipine  5 mg Oral Daily  . antiseptic oral rinse  7 mL Mouth Rinse q12n4p  . chlorhexidine  15 mL Mouth Rinse BID  . clopidogrel  75 mg Oral Daily  . feeding supplement (PRO-STAT SUGAR FREE 64)  30 mL Oral BID  . heparin  5,000 Units Subcutaneous 3 times per day  . lacosamide (VIMPAT) IV  125 mg Intravenous Q12H  . losartan  50 mg Oral Daily  . multivitamin with minerals   Oral Daily  . nebivolol  10 mg Oral Daily  . phenytoin (DILANTIN) IV  200 mg Intravenous Q12H  . piperacillin-tazobactam (ZOSYN)  IV  3.375 g Intravenous Q8H  . sodium chloride  3 mL Intravenous Q12H  . valproate sodium  500 mg Intravenous 3 times per day  . vancomycin  1,000 mg Intravenous Q12H   Continuous Infusions:    Assessment/Plan: Large post sacral ulcer + sepsis from Osteo on IV Zosyn-See prior discussions-Poor likely overall prognosis with his functional quadriplegia, Poor baseline status and ? risk of complications if any type of surgery is attempted--we appreciate input from Dr. Linda Hedges expertise-I would hold off on pain meds currently as this would alter his sensorium.  We will re-visit this discussion with wife in the am who still requires time to process all that is going on Palliative input appreciated  Code Status: Full Family Communication: discussed wife bedside Disposition Plan:  inpatient   Verneita Griffes, MD  [facility] Pager [phone] 01/03/2015, 2:47 PM    LOS: 1 day

## 2015-01-03 NOTE — Clinical Documentation Improvement (Signed)
Possible Clinical Conditions?  Functional Quadreplegia Other Condition Cannot Clinically Determine   Supporting Information: (as per notes) "45 yoM with hx of CVA x 2 in 2013 and 2016, with residual aphasia, currently non verbal and bed bound"  Thank You, Alessandra Grout, RN, BSN, CCDS,Clinical Documentation Specialist:  [phone]  [phone]=[facility] [place]- Health Information Management

## 2015-01-03 NOTE — Consult Note (Signed)
ASked to see [name] by Dr. Monia Pouch for palliative consult for Goals of Care  Impression: 1. S/P CVA with aphasia, aphagia, immobility 2. Sacral Decubitus - unstageable 3. Poor long term prognosis  Recommendations: 1. Loma [place], wife and HCPOA, to make decisions re: Code status and level of care 2. By patient's wishes no artificial nutrition, i.e. Tube feeds 3. If for comfort care only social work consult for residential hospice.   HPI Craig [name] is a 64 y.o. male has a past medical history significant for CVA x 2 in 2013 and 2016, with residual aphasia, currently non verbal and bed bound is being brought to the ED with fever and malodorous discharge from his sacral wound. Over the past month he has been having progressive sacral decubitus which per notes appeared as a slight skin breakdown at the beginning of April and progressed now to be unstageable. Dr. Dellia Nims did an extensive debridement of the area and per notes he has been growing Klebsiella, no sensitivities available but patient was on Ceftriaxone prior to his arrival. Patient is non verbal, is awake and tracks however does not follow any commands; occasionally he shakes his head "yes/no" however not doing that currently. Patient's wife is at bedside and provides some of the story however she does not know much as he was in a nursing home. In the ED patient with low grade temp, tachycardic, with leukocytosis and elevated lactic acid. His wound is malodorous. TRH asked for admission for sepsis due to sacral wound  Scheduled Meds:  amLODipine  5 mg Oral Daily   antiseptic oral rinse  7 mL Mouth Rinse q12n4p   chlorhexidine  15 mL Mouth Rinse BID   clopidogrel  75 mg Oral Daily   feeding supplement (PRO-STAT SUGAR FREE 64)  30 mL Oral BID   heparin  5,000 Units Subcutaneous 3 times per day   lacosamide (VIMPAT) IV  125 mg Intravenous Q12H   losartan  50 mg Oral Daily   multivitamin with minerals   Oral  Daily   nebivolol  10 mg Oral Daily   phenytoin (DILANTIN) IV  200 mg Intravenous Q12H   piperacillin-tazobactam (ZOSYN)  IV  3.375 g Intravenous Q8H   sodium chloride  3 mL Intravenous Q12H   valproate sodium  500 mg Intravenous 3 times per day   vancomycin  1,000 mg Intravenous Q12H   Continuous Infusions:  PRN Meds:.LORazepam, RESOURCE THICKENUP CLEAR     Past Medical History  Diagnosis Date   Hypertension    Allergy     RHINITIS   Diverticulosis    Colonic polyp    Stroke 12/06/2011    left sided weakness,dec.peripheral vision   Seizures    Dementia     History   Social History   Marital Status: Married    Spouse Name: Craig [name]   Number of Children: 1   Years of Education: college   Occupational History   retired [place] History Main Topics   Smoking status: Never Smoker    Smokeless tobacco: Never Used   Alcohol Use: No   Drug Use: No   Sexual Activity: Yes   Other Topics Concern   None   Social History Narrative   Did Track and field in his youth. Married 1983. Patient lived at home with his wife  has 2 years of college and 1 child. 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    Patient's  works at  [employer].  Patient denies smoking tobacco,drinking alcohol and taking all illict drugs.    Patient is a caffeine drinker.  Patient is right handed.    PE:  Filed Vitals:   01/03/15 0508  BP: 148/85  Pulse: 115  Temp: 99.5 F (37.5 C)  Resp: 20   General: thin black man in no apparent distress. Eyes are open but he does not talk HEENT: C&S clear, PERRLA Cor- RRR Pul- no increased WOB Abd-not examined Neuro- appears awake, complete aphasia. Derm - sacral wound not inspected  CBC Latest Ref Rng 01/02/2015 01/01/2015 12/01/2014  WBC 4.0 - 10.5 K/uL 14.4(H) 12.8(H) 8.6  Hemoglobin 13.0 - 17.0 g/dL 10.5(L) 10.3(L) 17.2(H)  Hematocrit 39.0 - 52.0  % 34.7(L) 33.6(L) 53.5(H)  Platelets 150 - 400 K/uL 385 394 264    BMP Latest Ref Rng 01/02/2015 01/01/2015 12/01/2014  Glucose 70 - 99 mg/dL 134(H) 149(H) 95  BUN 6 - 20 mg/dL _0 Creatinine 0.61 - 1.24 mg/dL 0.55(L) 0.49(L) 0.79  BUN/Creat Ratio 10 - 22 - - -  Sodium 135 - 145 mmol/L 136 138 144  Potassium 3.5 - 5.1 mmol/L 4.0 4.0 4.7  Chloride 101 - 111 mmol/L 101 100 104  CO2 22 - 32 mmol/L _1 Calcium 8.9 - 10.3 mg/dL 8.4(L) 8.5 9.7   Imaging 1. CXR - 01/01/15 IMPRESSION: Lungs hypoexpanded, with mild bibasilar atelectasis.  2. Sacral/coccyx x-ray 01/02/15 IMPRESSION: Large soft tissue ulceration overlying the coccyx. Underlying erosion of the coccyx cannot be excluded, but is not well assessed on radiograph. MRI could be considered for further evaluation, if deemed clinically appropriate.  Family conference: met with Mrs. Craig [name] and her son for 70 min. Discussed current condition, overall prognosis with his underlying stroke - including high risk for on-going complications with little chance of neurologic improvement. Discussed code status, intubation status. She reports that he did clearly communicate to her that he did not want tube feeds. Discussed options of care from aggressive to total comfort care w/o surgery or antibiotics, close attention to symptom management and possibility of residential hospice. She was not prepared to make decisions today.  Plan for f/u by Wadie Lessen, NP 5/3.

## 2015-01-04 NOTE — Progress Notes (Signed)
CSW received call from RN, [place] that patient's wife is at bedside & meeting with Dr. Linda Hedges, Palliative Medicine Team and recommending residential hospice at this time. Wife is requesting [facility]. CSW made referral to [facility], [facility] - awaiting call back re: bed availability/eligbility.      Raynaldo Opitz, [facility] Clinical Social Worker cell #: [phone]

## 2015-01-04 NOTE — Progress Notes (Signed)
ANTIBIOTIC CONSULT NOTE - follow-up  Pharmacy Consult for Zosyn/Vancomycin Indication: Wound infection  No Known Allergies  Patient Measurements: Height: 5\' 8"  (172.7 cm) Weight: 169 lb 15.6 oz (77.1 kg) IBW/kg (Calculated) : 68.4   Vital Signs: Temp: 97.4 F (36.3 C) (05/03 0509) Temp Source: Oral (05/03 0509) BP: 160/92 mmHg (05/03 0509) Pulse Rate: 97 (05/03 0509) Intake/Output from previous day: 05/02 0701 - 05/03 0700 In: 1001.5 [P.O.:30; IV Piggyback:971.5] Out: 1925 [ZYSAY:3016] Intake/Output from this shift:    Labs:  Recent Labs  01/01/15 2311 01/02/15 0447  WBC 12.8* 14.4*  HGB 10.3* 10.5*  PLT 394 385  CREATININE 0.49* 0.55*   Estimated Creatinine Clearance: 92.6 mL/min (by C-G formula based on Cr of 0.55). No results for input(s): VANCOTROUGH, VANCOPEAK, VANCORANDOM, GENTTROUGH, GENTPEAK, GENTRANDOM, TOBRATROUGH, TOBRAPEAK, TOBRARND, AMIKACINPEAK, AMIKACINTROU, AMIKACIN in the last 72 hours.   Microbiology: Recent Results (from the past 720 hour(s))  Blood Culture (routine x 2)     Status: None (Preliminary result)   Collection Time: 01/02/15 12:01 AM  Result Value Ref Range Status   Specimen Description BLOOD LEFT FOREARM  Final   Special Requests BOTTLES DRAWN AEROBIC AND ANAEROBIC 5CC  Final   Culture   Final           BLOOD CULTURE RECEIVED NO GROWTH TO DATE CULTURE WILL BE HELD FOR 5 DAYS BEFORE ISSUING A FINAL NEGATIVE REPORT Performed at [facility]    Report Status PENDING  Incomplete  Blood Culture (routine x 2)     Status: None (Preliminary result)   Collection Time: 01/02/15 12:01 AM  Result Value Ref Range Status   Specimen Description BLOOD RIGHT ANTECUBITAL  Final   Special Requests BOTTLES DRAWN AEROBIC AND ANAEROBIC 5CC  Final   Culture   Final           BLOOD CULTURE RECEIVED NO GROWTH TO DATE CULTURE WILL BE HELD FOR 5 DAYS BEFORE ISSUING A FINAL NEGATIVE REPORT Performed at [facility]    Report Status  PENDING  Incomplete  Urine culture     Status: None   Collection Time: 01/02/15  2:39 AM  Result Value Ref Range Status   Specimen Description URINE, CLEAN CATCH  Final   Special Requests NONE  Final   Colony Count   Final    25,000 COLONIES/ML Performed at [facility]    Culture   Final    Multiple bacterial morphotypes present, none predominant. Suggest appropriate recollection if clinically indicated. Performed at [facility]    Report Status 01/03/2015 FINAL  Final  MRSA PCR Screening     Status: None   Collection Time: 01/02/15  4:23 AM  Result Value Ref Range Status   MRSA by PCR NEGATIVE NEGATIVE Final    Comment:        The GeneXpert MRSA Assay (FDA approved for NASAL specimens only), is one component of a comprehensive MRSA colonization surveillance program. It is not intended to diagnose MRSA infection nor to guide or monitor treatment for MRSA infections.     Medical History: Past Medical History  Diagnosis Date  . Hypertension   . Allergy     RHINITIS  . Diverticulosis   . Colonic polyp   . 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 Seizures   . Dementia      Assessment: 72 yoM with hx of CVA x 2 in 2013 and 2016, with residual aphasia, currently non verbal and  bed bound is being brought to the ED with fever and malodorous discharge from his sacral wound. Zosyn and Vancomycin per Rx for wound infection/Sepsis. Per notes, wound is unstageable.  Xray of coccyx reveals possible erosion of coccyx, no MRI at this time d/t loop recorder  5/1 >>zosyn  >> 5/1 >>vancomycin  >>    5/1 bloodx2: NGTD 5/1 urine: 25K multiple morphologies 4/19 wound: klebsiella per NH notes (no susc reported in note but on CTX).  S/p I&D at [facility])  Dose changes/levels 5/4 0330 VT = ___mcg/ml on vanco 1gm q12h (prior to 6th dose)  WBC elevated, SCr low/stable, afebrile ESR and CRP elevated  Goal of Therapy:  Vancomycin trough level 15-20  mcg/ml - treat as OM if wound tracks to bone  Plan:  Day #3 vanco/zosyn  Continue Zosyn 3.375 Gm IV q8h EI  Continue Vancomycin  1Gm IV q12h  F/u SCr/cultures/levels as needed.    Plan to check vancomycin trough in am.  SCr may overestimate renal function d/t bedbound state  Doreene Eland, PharmD, BCPS.   Pager: [phone]  01/04/2015,8:31 AM

## 2015-01-04 NOTE — Care Management Note (Signed)
Case Management Note  Patient Details  Name: Craig [name] MRN: [id number] Date of Birth: Mar 12, 1951  Subjective/Objective:     64 y/o m admitted w/sepsis.               Action/Plan:From SNf-[facility]   Expected Discharge Date:  01/04/15               Expected Discharge Plan:  [place] (From [place])  In-House Referral:  Clinical Social Work  Discharge planning Services  CM Consult  Post Acute Care Choice:    Choice offered to:     DME Arranged:    DME Agency:     HH Arranged:    [place] Agency:     Status of Service:  Completed, signed off  Medicare Important Message Given:    Date Medicare IM Given:    Medicare IM give by:    Date Additional Medicare IM Given:    Additional Medicare Important Message give by:     If discussed at [place] of Stay Meetings, dates discussed:    Additional Comments:d/c snf.  Dessa Phi, RN 01/04/2015, 1:39 PM

## 2015-01-04 NOTE — Discharge Summary (Signed)
Physician Discharge Summary  Craig [name] JJH:[id number] DOB: 09/07/1950 DOA: 01/01/2015  PCP: Wyatt Haste, MD  Admit date: 01/01/2015 Discharge date: 01/04/2015  Time spent: 45 minutes  Recommendations for Outpatient Follow-up:  1. Full Hospice care at [facility] place as disposition on d/c home   Discharge Diagnoses:  Principal Problem:   Sepsis Active Problems:   Complex partial epileptic seizure   Essential hypertension   History of stroke   Decubitus ulcer of sacral region   Aphasia complicating stroke   Discharge Condition: Gaurded  Diet recommendation:  Comfort feeds  Filed Weights   01/01/15 2301 01/02/15 0259 01/04/15 1136  Weight: 77.111 kg (170 lb) 77.1 kg (169 lb 15.6 oz) 77.1 kg (169 lb 15.6 oz)    History of present illness:  63 ? 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 At baseline Aphasic + non-verbal Found to have decubitus ulcer April--Now unstageable despite debridement from Dr. Dellia Nims Admitted 01/02/15 early am with malodorous wound and toxic metabolic encephalopathy with decreased sensorium and ? level of responsiveness from his baseline  Initially was kept on broad spectrum ABX Ultimately via discussions with wife and family and c assistance of palliative care, Goals of care delineated to FULL COMFORT. NO Pain meds needed as patient was alert enough on day of d/c to nod head and state no needs for pain meds  Discharge Exam: Filed Vitals:   01/04/15 0509  BP: 160/92  Pulse: 97  Temp: 97.4 F (36.3 C)  Resp: 16    General: eomi, ncat-non-verbal Cardiovascular: s1 s2 non m/r/g Respiratory: clear no added   Discharge Instructions   Discharge Instructions    Diet - low sodium heart healthy    Complete by:  As directed      Increase activity slowly    Complete by:  As directed            Current Discharge Medication List    CONTINUE these medications which have NOT CHANGED   Details  acetaminophen (TYLENOL) 325 MG tablet Take 325 mg by mouth every 6 (six) hours as needed for moderate pain.    divalproex (DEPAKOTE SPRINKLE) 125 MG capsule Take 500 mg by mouth 3 (three) times daily.     Lacosamide (VIMPAT) 10 MG/ML SOLN Take 12.5 mLs (125 mg total) by mouth 2 (two) times daily. For seizure Qty: 600 mL, Refills: 5    phenytoin (DILANTIN) 50 MG tablet Chew 200 mg by mouth 2 (two) times daily.       STOP taking these medications     Amino Acids-Protein Hydrolys (FEEDING SUPPLEMENT, PRO-STAT SUGAR FREE 64,) LIQD      amLODipine (NORVASC) 5 MG tablet      BYSTOLIC 10 MG tablet      cefTRIAXone (ROCEPHIN) 1 G injection      cloNIDine (CATAPRES) 0.1 MG tablet      clopidogrel (PLAVIX) 75 MG tablet      losartan (COZAAR) 50 MG tablet      Multiple Vitamins-Minerals (DECUBI-VITE PO)      amoxicillin-clavulanate (AUGMENTIN) 875-125 MG per tablet      irbesartan (AVAPRO) 150 MG tablet        No Known Allergies    The results of significant diagnostics from this hospitalization (including imaging, microbiology, ancillary and laboratory) are listed below for reference.    Significant Diagnostic Studies: Dg Chest 2  View  01/01/2015   CLINICAL DATA:  Acute onset of fever and high blood pressure. Tachycardia. Initial encounter.  EXAM: CHEST  2 VIEW  COMPARISON:  Chest radiograph performed 12/01/2014  FINDINGS: The lungs are hypoexpanded. Mild bibasilar atelectasis is noted. There is no evidence of pleural effusion or pneumothorax.  The heart is borderline normal in size. No acute osseous abnormalities are seen.  IMPRESSION: Lungs hypoexpanded, with mild bibasilar atelectasis.   Electronically Signed   By: Garald Balding M.D.   On: 01/01/2015 23:29   Dg Sacrum/coccyx  01/02/2015   CLINICAL DATA:  Acute onset of fever. Sacral pressure ulcer. High blood  pressure. Initial encounter.  EXAM: SACRUM AND COCCYX - 2+ VIEW  COMPARISON:  Lumbar spine radiographs performed 08/23/2014  FINDINGS: A large soft tissue ulceration is noted overlying the coccyx. Underlying erosion of the coccyx cannot be excluded, but is not well assessed.  There is no evidence of fracture or dislocation. The sacroiliac joints are unremarkable in appearance. The visualized portions of both hips are within normal limits. The visualized bowel gas pattern is grossly unremarkable.  IMPRESSION: Large soft tissue ulceration overlying the coccyx. Underlying erosion of the coccyx cannot be excluded, but is not well assessed on radiograph. MRI could be considered for further evaluation, if deemed clinically appropriate.   Electronically Signed   By: Garald Balding M.D.   On: 01/02/2015 01:18    Microbiology: Recent Results (from the past 240 hour(s))  Blood Culture (routine x 2)     Status: None (Preliminary result)   Collection Time: 01/02/15 12:01 AM  Result Value Ref Range Status   Specimen Description BLOOD LEFT FOREARM  Final   Special Requests BOTTLES DRAWN AEROBIC AND ANAEROBIC 5CC  Final   Culture   Final           BLOOD CULTURE RECEIVED NO GROWTH TO DATE CULTURE WILL BE HELD FOR 5 DAYS BEFORE ISSUING A FINAL NEGATIVE REPORT Performed at [facility]    Report Status PENDING  Incomplete  Blood Culture (routine x 2)     Status: None (Preliminary result)   Collection Time: 01/02/15 12:01 AM  Result Value Ref Range Status   Specimen Description BLOOD RIGHT ANTECUBITAL  Final   Special Requests BOTTLES DRAWN AEROBIC AND ANAEROBIC 5CC  Final   Culture   Final           BLOOD CULTURE RECEIVED NO GROWTH TO DATE CULTURE WILL BE HELD FOR 5 DAYS BEFORE ISSUING A FINAL NEGATIVE REPORT Performed at [facility]    Report Status PENDING  Incomplete  Urine culture     Status: None   Collection Time: 01/02/15  2:39 AM  Result Value Ref Range Status   Specimen  Description URINE, CLEAN CATCH  Final   Special Requests NONE  Final   Colony Count   Final    25,000 COLONIES/ML Performed at [facility]    Culture   Final    Multiple bacterial morphotypes present, none predominant. Suggest appropriate recollection if clinically indicated. Performed at [facility]    Report Status 01/03/2015 FINAL  Final  MRSA PCR Screening     Status: None   Collection Time: 01/02/15  4:23 AM  Result Value Ref Range Status   MRSA by PCR NEGATIVE NEGATIVE Final    Comment:        The GeneXpert MRSA Assay (FDA approved for NASAL specimens only), is one component of a comprehensive MRSA colonization surveillance  program. It is not intended to diagnose MRSA infection nor to guide or monitor treatment for MRSA infections.      Labs: Basic Metabolic Panel:  Recent Labs Lab 01/01/15 2311 01/02/15 0447  NA 138 136  K 4.0 4.0  CL 100 101  CO2 31 29  GLUCOSE 149* 134*  BUN 7 7  CREATININE 0.49* 0.55*  CALCIUM 8.5 8.4*   Liver Function Tests:  Recent Labs Lab 01/01/15 2311  AST 28  ALT 37  ALKPHOS 94  BILITOT 0.4  PROT 6.2  ALBUMIN 2.3*   No results for input(s): LIPASE, AMYLASE in the last 168 hours. No results for input(s): AMMONIA in the last 168 hours. CBC:  Recent Labs Lab 01/01/15 2311 01/02/15 0447  WBC 12.8* 14.4*  NEUTROABS 9.6*  --   HGB 10.3* 10.5*  HCT 33.6* 34.7*  MCV 89.4 89.2  PLT 394 385   Cardiac Enzymes: No results for input(s): CKTOTAL, CKMB, CKMBINDEX, TROPONINI in the last 168 hours. BNP: BNP (last 3 results) No results for input(s): BNP in the last 8760 hours.  ProBNP (last 3 results) No results for input(s): PROBNP in the last 8760 hours.  CBG: No results for input(s): GLUCAP in the last 168 hours.     SignedNita Sells  [facility] 01/04/2015, 1:01 PM

## 2015-01-04 NOTE — Progress Notes (Signed)
Initial Nutrition Assessment  DOCUMENTATION CODES:  Not applicable  INTERVENTION:  Magic cup BID  NUTRITION DIAGNOSIS:  Increased nutrient needs related to chronic illness as evidenced by estimated needs.  GOAL:  Patient will meet greater than or equal to 90% of their needs  MONITOR:  PO intake, Supplement acceptance, Weight trends, I & O's  REASON FOR ASSESSMENT:  Malnutrition Screening Tool    ASSESSMENT: Pt with hx of CVA with residual aphasia who was admitted for recurrent CVA and is now nonverbal.  Pt seen for MST: 3, consult canceled. No family in the room and pt unable to provide information. Noted half consumed Magic Cup on bedside table; will order BID. Pt on Dys 1, honey-thick liquids per SLP recommendations 5/1. Documentation indicates he ate 100% dinner 5/1 and 15% breakfast yesterday. Palliative consult placed and family reported yesterday that pt has made it clear in the past he does not want TF.  Unable to assess if pt is fully meeting needs. Labs and medications reviewed. Physical assessment does not show muscle or fat wasting.  Height:  Ht Readings from Last 1 Encounters:  01/04/15 5\' 8"  (1.727 m)    Weight:  Wt Readings from Last 1 Encounters:  01/04/15 169 lb 15.6 oz (77.1 kg)    Ideal Body Weight:  70 kg (kg)  Wt Readings from Last 10 Encounters:  01/04/15 169 lb 15.6 oz (77.1 kg)  11/16/14 170 lb 6.4 oz (77.293 kg)  10/19/14 178 lb (80.74 kg)  10/02/14 205 lb 7.5 oz (93.2 kg)  08/17/14 174 lb (78.926 kg)  05/05/14 162 lb (73.483 kg)  03/10/14 175 lb 12.8 oz (79.742 kg)  12/18/13 148 lb (67.132 kg)  12/04/13 186 lb (84.369 kg)  09/21/13 186 lb (84.369 kg)    BMI:  Body mass index is 25.85 kg/(m^2).  Estimated Nutritional Needs:  Kcal:  1700-1900  Protein:  80-100 grams  Fluid:  2L/day  Skin:  Wound (see comment)  Diet Order:  DIET - DYS 1 Room service appropriate?: No; Fluid consistency:: Honey Thick  EDUCATION  NEEDS:  Education not appropriate at this time   Intake/Output Summary (Last 24 hours) at 01/04/15 1139 Last data filed at 01/04/15 0654  Gross per 24 hour  Intake    780 ml  Output   1925 ml  Net  -1145 ml    Last BM:  5/2    Jarome Matin, RD, LDN Inpatient Clinical Dietitian Pager # [phone] After hours/weekend pager # [phone]

## 2015-01-05 ENCOUNTER — Telehealth: Payer: Self-pay | Admitting: Cardiology

## 2015-01-05 NOTE — Telephone Encounter (Signed)
Spoke w/ pt wife and instructed her how to send manual transmission.

## 2015-01-08 LAB — CULTURE, BLOOD (ROUTINE X 2)
Culture: NO GROWTH
Culture: NO GROWTH

## 2015-01-10 ENCOUNTER — Ambulatory Visit (INDEPENDENT_AMBULATORY_CARE_PROVIDER_SITE_OTHER): Payer: Medicare PPO | Admitting: *Deleted

## 2015-01-10 DIAGNOSIS — I639 Cerebral infarction, unspecified: Secondary | ICD-10-CM | POA: Diagnosis not present

## 2015-01-11 LAB — CUP PACEART REMOTE DEVICE CHECK
Date Time Interrogation Session: 20160504212251
MDC IDC SET ZONE DETECTION INTERVAL: 2000 ms
MDC IDC SET ZONE DETECTION INTERVAL: 360 ms
Zone Setting Detection Interval: 3000 ms

## 2015-01-14 NOTE — Progress Notes (Signed)
Loop recorder 

## 2015-01-18 ENCOUNTER — Telehealth: Payer: Self-pay | Admitting: Family Medicine

## 2015-01-18 NOTE — Telephone Encounter (Signed)
Craig [name] called and stated that Craig [name] passed away last week. She would like to speak to you concerning issues with his death. Please call Craig [name] at [phone].

## 2015-01-20 ENCOUNTER — Encounter: Payer: Self-pay | Admitting: Cardiology

## 2015-01-27 ENCOUNTER — Encounter: Payer: Self-pay | Admitting: Cardiology

## 2015-02-02 LAB — CUP PACEART REMOTE DEVICE CHECK: Date Time Interrogation Session: 20160601135309

## 2015-02-02 DEATH — deceased

## 2015-02-03 ENCOUNTER — Encounter: Payer: Self-pay | Admitting: Internal Medicine

## 2015-02-04 ENCOUNTER — Encounter: Payer: Self-pay | Admitting: Cardiology

## 2015-02-07 ENCOUNTER — Ambulatory Visit: Payer: Self-pay | Admitting: Neurology

## 2015-02-11 ENCOUNTER — Encounter: Payer: Self-pay | Admitting: Cardiology

## 2015-02-14 ENCOUNTER — Encounter: Payer: Self-pay | Admitting: Internal Medicine

## 2015-02-16 ENCOUNTER — Encounter: Payer: Self-pay | Admitting: Cardiology

## 2015-02-23 ENCOUNTER — Encounter: Payer: Self-pay | Admitting: Cardiology

## 2015-03-01 ENCOUNTER — Encounter: Payer: Self-pay | Admitting: Cardiology

## 2015-06-29 NOTE — Telephone Encounter (Signed)
Error

## 2015-06-30 NOTE — Telephone Encounter (Signed)
Error

## 2016-04-29 IMAGING — CR DG CHEST 1V
1 series · 1 of 1 positions shown · non-contrast
Comparison: 02/19/2012.

CLINICAL DATA: Altered mental status.  Stroke.  Prior CVA.

EXAM:
CHEST  1 VIEW

[chest pa]
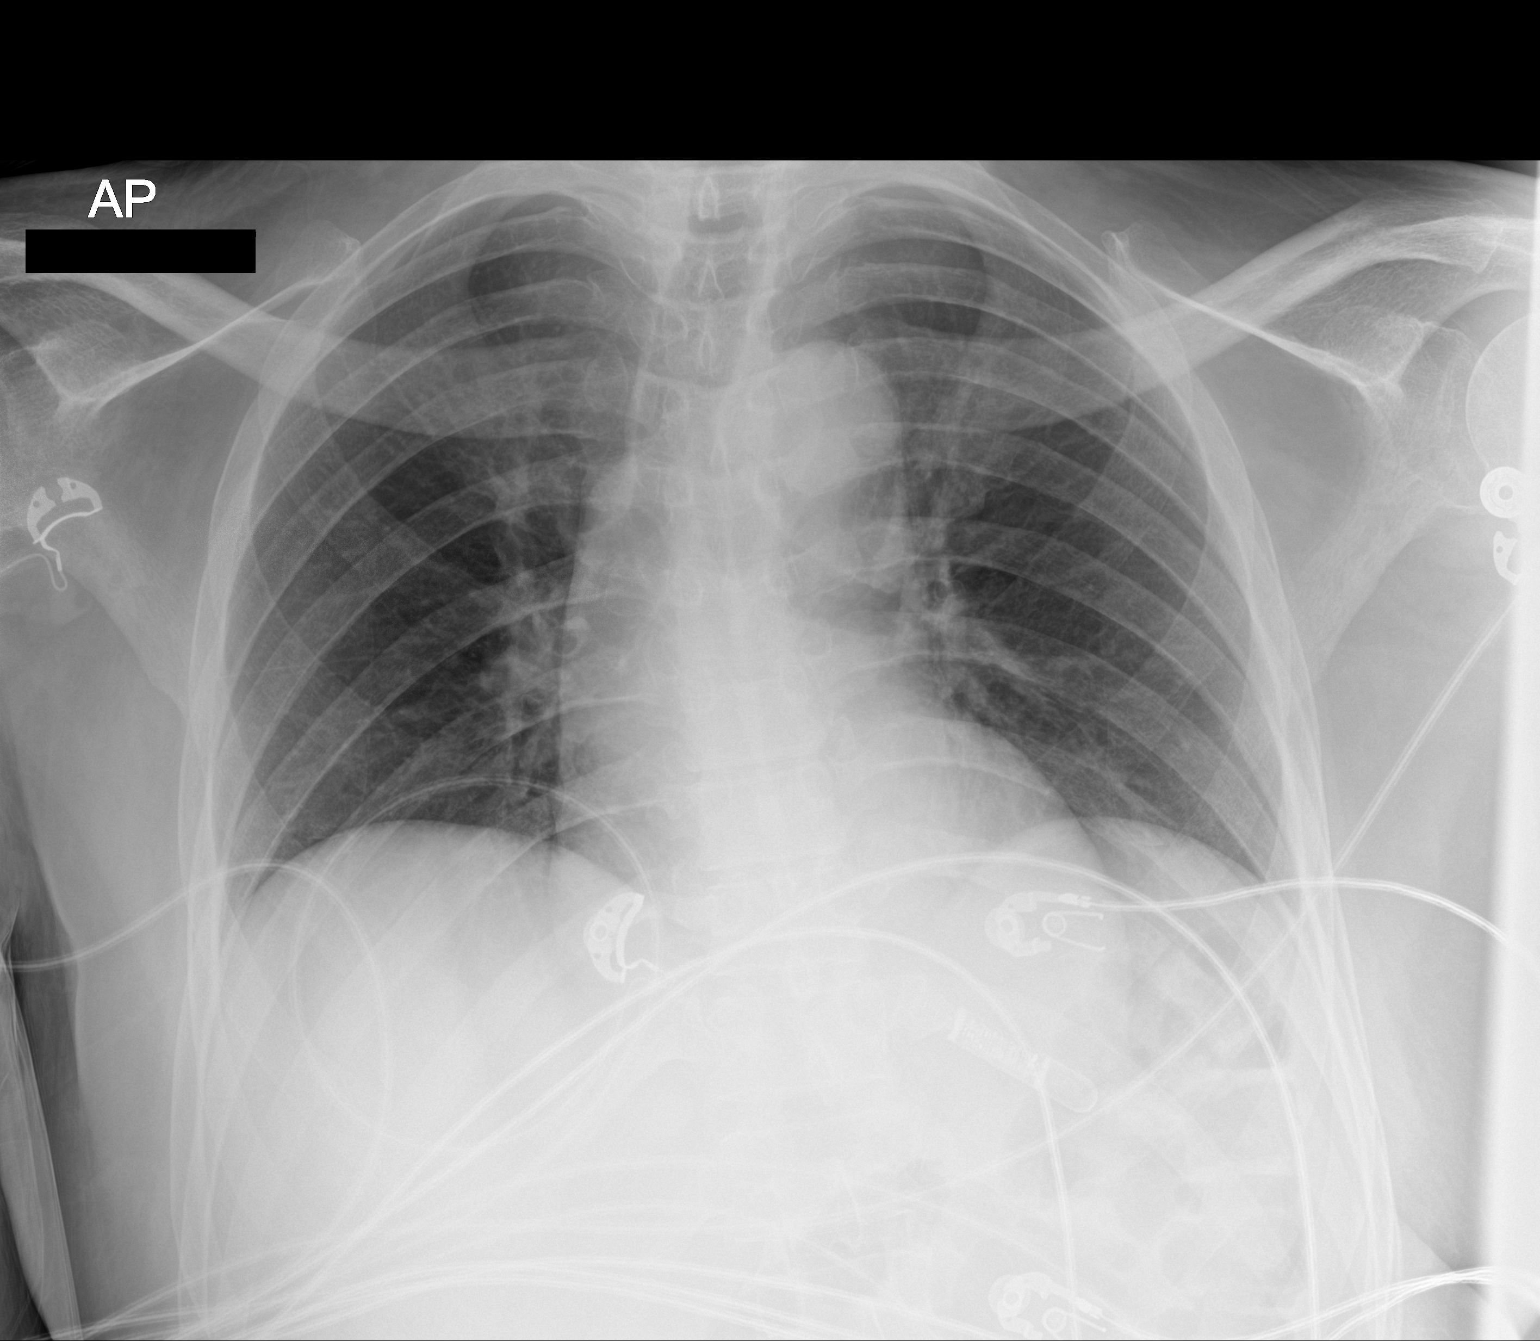

[1 of 1 positions shown; findings below may reference images not displayed]

FINDINGS: Cardiopericardial silhouette within normal limits. Mediastinal
contours normal. Trachea midline. No airspace disease or effusion.
Lung volumes are reduced, with elevation of both hemidiaphragms.
Monitoring leads project over the chest.
IMPRESSION: Suboptimal inspiration.  No active cardiopulmonary disease.
